# Patient Record
Sex: Female | Born: 1935 | Race: White | Hispanic: No | Marital: Married | Smoking: Never smoker
Health system: Southern US, Community
[De-identification: ages and names within clinical notes are randomized; demographics above are authoritative.]

## PROBLEM LIST (undated history)

## (undated) DIAGNOSIS — I739 Peripheral vascular disease, unspecified: Secondary | ICD-10-CM

## (undated) DIAGNOSIS — J942 Hemothorax: Secondary | ICD-10-CM

## (undated) DIAGNOSIS — N390 Urinary tract infection, site not specified: Secondary | ICD-10-CM

## (undated) DIAGNOSIS — G4700 Insomnia, unspecified: Secondary | ICD-10-CM

## (undated) DIAGNOSIS — I639 Cerebral infarction, unspecified: Secondary | ICD-10-CM

## (undated) DIAGNOSIS — IMO0002 Reserved for concepts with insufficient information to code with codable children: Secondary | ICD-10-CM

## (undated) DIAGNOSIS — F32A Depression, unspecified: Secondary | ICD-10-CM

## (undated) DIAGNOSIS — I779 Disorder of arteries and arterioles, unspecified: Secondary | ICD-10-CM

## (undated) DIAGNOSIS — E538 Deficiency of other specified B group vitamins: Secondary | ICD-10-CM

## (undated) DIAGNOSIS — K219 Gastro-esophageal reflux disease without esophagitis: Secondary | ICD-10-CM

## (undated) DIAGNOSIS — E785 Hyperlipidemia, unspecified: Secondary | ICD-10-CM

## (undated) DIAGNOSIS — R609 Edema, unspecified: Secondary | ICD-10-CM

## (undated) DIAGNOSIS — R55 Syncope and collapse: Secondary | ICD-10-CM

## (undated) DIAGNOSIS — F459 Somatoform disorder, unspecified: Secondary | ICD-10-CM

## (undated) DIAGNOSIS — F329 Major depressive disorder, single episode, unspecified: Secondary | ICD-10-CM

## (undated) DIAGNOSIS — N393 Stress incontinence (female) (male): Secondary | ICD-10-CM

## (undated) DIAGNOSIS — C50919 Malignant neoplasm of unspecified site of unspecified female breast: Secondary | ICD-10-CM

## (undated) DIAGNOSIS — R109 Unspecified abdominal pain: Secondary | ICD-10-CM

## (undated) DIAGNOSIS — N289 Disorder of kidney and ureter, unspecified: Secondary | ICD-10-CM

## (undated) DIAGNOSIS — Z860100 Personal history of colon polyps, unspecified: Secondary | ICD-10-CM

## (undated) DIAGNOSIS — E876 Hypokalemia: Secondary | ICD-10-CM

## (undated) DIAGNOSIS — A048 Other specified bacterial intestinal infections: Secondary | ICD-10-CM

## (undated) DIAGNOSIS — K5730 Diverticulosis of large intestine without perforation or abscess without bleeding: Secondary | ICD-10-CM

## (undated) DIAGNOSIS — I2510 Atherosclerotic heart disease of native coronary artery without angina pectoris: Secondary | ICD-10-CM

## (undated) DIAGNOSIS — F419 Anxiety disorder, unspecified: Secondary | ICD-10-CM

## (undated) DIAGNOSIS — R9430 Abnormal result of cardiovascular function study, unspecified: Secondary | ICD-10-CM

## (undated) HISTORY — DX: Disorder of arteries and arterioles, unspecified: I77.9

## (undated) HISTORY — DX: Stress incontinence (female) (male): N39.3

## (undated) HISTORY — PX: OTHER SURGICAL HISTORY: SHX169

## (undated) HISTORY — DX: Deficiency of other specified B group vitamins: E53.8

## (undated) HISTORY — DX: Anxiety disorder, unspecified: F41.9

## (undated) HISTORY — DX: Major depressive disorder, single episode, unspecified: F32.9

## (undated) HISTORY — PX: BREAST FIBROADENOMA SURGERY: SHX580

## (undated) HISTORY — DX: Syncope and collapse: R55

## (undated) HISTORY — DX: Diverticulosis of large intestine without perforation or abscess without bleeding: K57.30

## (undated) HISTORY — DX: Hyperlipidemia, unspecified: E78.5

## (undated) HISTORY — DX: Hemothorax: J94.2

## (undated) HISTORY — DX: Atherosclerotic heart disease of native coronary artery without angina pectoris: I25.10

## (undated) HISTORY — DX: Hypokalemia: E87.6

## (undated) HISTORY — DX: Peripheral vascular disease, unspecified: I73.9

## (undated) HISTORY — DX: Personal history of colon polyps, unspecified: Z86.0100

## (undated) HISTORY — PX: CORONARY ARTERY BYPASS GRAFT: SHX141

## (undated) HISTORY — DX: Somatoform disorder, unspecified: F45.9

## (undated) HISTORY — DX: Malignant neoplasm of unspecified site of unspecified female breast: C50.919

## (undated) HISTORY — DX: Disorder of kidney and ureter, unspecified: N28.9

## (undated) HISTORY — DX: Personal history of colonic polyps: Z86.010

## (undated) HISTORY — DX: Depression, unspecified: F32.A

## (undated) HISTORY — DX: Unspecified abdominal pain: R10.9

## (undated) HISTORY — DX: Edema, unspecified: R60.9

## (undated) HISTORY — DX: Urinary tract infection, site not specified: N39.0

## (undated) HISTORY — DX: Reserved for concepts with insufficient information to code with codable children: IMO0002

## (undated) HISTORY — DX: Insomnia, unspecified: G47.00

## (undated) HISTORY — DX: Other specified bacterial intestinal infections: A04.8

## (undated) HISTORY — PX: MYOMECTOMY: SHX85

## (undated) HISTORY — DX: Gastro-esophageal reflux disease without esophagitis: K21.9

## (undated) HISTORY — DX: Abnormal result of cardiovascular function study, unspecified: R94.30

---

## 1997-10-22 ENCOUNTER — Other Ambulatory Visit: Admission: RE | Admit: 1997-10-22 | Discharge: 1997-10-22 | Payer: Self-pay | Admitting: Family Medicine

## 1997-11-01 ENCOUNTER — Other Ambulatory Visit: Admission: RE | Admit: 1997-11-01 | Discharge: 1997-11-01 | Payer: Self-pay | Admitting: Family Medicine

## 1997-11-17 ENCOUNTER — Other Ambulatory Visit: Admission: RE | Admit: 1997-11-17 | Discharge: 1997-11-17 | Payer: Self-pay | Admitting: Family Medicine

## 1998-04-19 ENCOUNTER — Encounter: Payer: Self-pay | Admitting: Family Medicine

## 1998-04-19 ENCOUNTER — Ambulatory Visit (HOSPITAL_COMMUNITY): Admission: RE | Admit: 1998-04-19 | Discharge: 1998-04-19 | Payer: Self-pay | Admitting: Family Medicine

## 1999-04-04 ENCOUNTER — Ambulatory Visit (HOSPITAL_COMMUNITY): Admission: RE | Admit: 1999-04-04 | Discharge: 1999-04-04 | Payer: Self-pay | Admitting: Family Medicine

## 1999-04-04 ENCOUNTER — Encounter: Payer: Self-pay | Admitting: Family Medicine

## 1999-09-30 ENCOUNTER — Encounter (INDEPENDENT_AMBULATORY_CARE_PROVIDER_SITE_OTHER): Payer: Self-pay | Admitting: Specialist

## 1999-09-30 ENCOUNTER — Other Ambulatory Visit: Admission: RE | Admit: 1999-09-30 | Discharge: 1999-09-30 | Payer: Self-pay | Admitting: Gastroenterology

## 2000-06-26 ENCOUNTER — Other Ambulatory Visit: Admission: RE | Admit: 2000-06-26 | Discharge: 2000-06-26 | Payer: Self-pay | Admitting: Family Medicine

## 2000-10-22 ENCOUNTER — Encounter: Payer: Self-pay | Admitting: Emergency Medicine

## 2000-10-22 ENCOUNTER — Emergency Department (HOSPITAL_COMMUNITY): Admission: EM | Admit: 2000-10-22 | Discharge: 2000-10-22 | Payer: Self-pay | Admitting: Emergency Medicine

## 2001-05-15 ENCOUNTER — Ambulatory Visit (HOSPITAL_COMMUNITY): Admission: RE | Admit: 2001-05-15 | Discharge: 2001-05-15 | Payer: Self-pay | Admitting: Family Medicine

## 2001-05-15 ENCOUNTER — Encounter: Payer: Self-pay | Admitting: Family Medicine

## 2001-07-29 ENCOUNTER — Encounter: Admission: RE | Admit: 2001-07-29 | Discharge: 2001-10-27 | Payer: Self-pay | Admitting: Neurology

## 2002-07-06 ENCOUNTER — Emergency Department (HOSPITAL_COMMUNITY): Admission: EM | Admit: 2002-07-06 | Discharge: 2002-07-07 | Payer: Self-pay | Admitting: Emergency Medicine

## 2002-12-30 ENCOUNTER — Inpatient Hospital Stay (HOSPITAL_COMMUNITY): Admission: EM | Admit: 2002-12-30 | Discharge: 2003-01-08 | Payer: Self-pay | Admitting: Emergency Medicine

## 2002-12-31 ENCOUNTER — Encounter: Payer: Self-pay | Admitting: Cardiology

## 2003-01-01 ENCOUNTER — Encounter: Payer: Self-pay | Admitting: Thoracic Surgery (Cardiothoracic Vascular Surgery)

## 2003-01-02 ENCOUNTER — Encounter: Payer: Self-pay | Admitting: Thoracic Surgery (Cardiothoracic Vascular Surgery)

## 2003-01-03 ENCOUNTER — Encounter: Payer: Self-pay | Admitting: Cardiothoracic Surgery

## 2003-01-04 ENCOUNTER — Encounter: Payer: Self-pay | Admitting: Cardiothoracic Surgery

## 2003-01-05 ENCOUNTER — Encounter: Payer: Self-pay | Admitting: Thoracic Surgery (Cardiothoracic Vascular Surgery)

## 2003-02-10 ENCOUNTER — Encounter (HOSPITAL_COMMUNITY): Admission: RE | Admit: 2003-02-10 | Discharge: 2003-05-11 | Payer: Self-pay | Admitting: Cardiology

## 2003-02-15 ENCOUNTER — Encounter
Admission: RE | Admit: 2003-02-15 | Discharge: 2003-02-15 | Payer: Self-pay | Admitting: Thoracic Surgery (Cardiothoracic Vascular Surgery)

## 2003-02-15 ENCOUNTER — Encounter: Payer: Self-pay | Admitting: Thoracic Surgery (Cardiothoracic Vascular Surgery)

## 2003-04-23 ENCOUNTER — Other Ambulatory Visit: Admission: RE | Admit: 2003-04-23 | Discharge: 2003-04-23 | Payer: Self-pay | Admitting: Obstetrics and Gynecology

## 2003-05-08 HISTORY — PX: HEMICOLECTOMY: SHX854

## 2003-08-26 ENCOUNTER — Encounter: Payer: Self-pay | Admitting: Internal Medicine

## 2003-08-26 DIAGNOSIS — K449 Diaphragmatic hernia without obstruction or gangrene: Secondary | ICD-10-CM | POA: Insufficient documentation

## 2003-08-26 DIAGNOSIS — D126 Benign neoplasm of colon, unspecified: Secondary | ICD-10-CM

## 2003-09-01 ENCOUNTER — Emergency Department (HOSPITAL_COMMUNITY): Admission: EM | Admit: 2003-09-01 | Discharge: 2003-09-01 | Payer: Self-pay | Admitting: Emergency Medicine

## 2003-09-02 ENCOUNTER — Ambulatory Visit (HOSPITAL_COMMUNITY): Admission: RE | Admit: 2003-09-02 | Discharge: 2003-09-02 | Payer: Self-pay | Admitting: Internal Medicine

## 2003-09-09 ENCOUNTER — Ambulatory Visit (HOSPITAL_COMMUNITY): Admission: RE | Admit: 2003-09-09 | Discharge: 2003-09-09 | Payer: Self-pay | Admitting: Internal Medicine

## 2003-09-23 ENCOUNTER — Inpatient Hospital Stay (HOSPITAL_COMMUNITY): Admission: AD | Admit: 2003-09-23 | Discharge: 2003-09-28 | Payer: Self-pay | Admitting: Internal Medicine

## 2003-09-23 ENCOUNTER — Encounter (INDEPENDENT_AMBULATORY_CARE_PROVIDER_SITE_OTHER): Payer: Self-pay | Admitting: *Deleted

## 2003-09-27 ENCOUNTER — Encounter (INDEPENDENT_AMBULATORY_CARE_PROVIDER_SITE_OTHER): Payer: Self-pay | Admitting: *Deleted

## 2003-10-05 ENCOUNTER — Encounter: Payer: Self-pay | Admitting: Internal Medicine

## 2003-10-05 ENCOUNTER — Encounter: Admission: RE | Admit: 2003-10-05 | Discharge: 2003-10-05 | Payer: Self-pay | Admitting: Internal Medicine

## 2003-10-07 ENCOUNTER — Inpatient Hospital Stay (HOSPITAL_COMMUNITY): Admission: AD | Admit: 2003-10-07 | Discharge: 2003-10-08 | Payer: Self-pay | Admitting: Internal Medicine

## 2003-10-21 ENCOUNTER — Inpatient Hospital Stay (HOSPITAL_COMMUNITY): Admission: RE | Admit: 2003-10-21 | Discharge: 2003-10-27 | Payer: Self-pay | Admitting: General Surgery

## 2003-10-21 ENCOUNTER — Encounter (INDEPENDENT_AMBULATORY_CARE_PROVIDER_SITE_OTHER): Payer: Self-pay | Admitting: *Deleted

## 2004-03-17 ENCOUNTER — Ambulatory Visit: Payer: Self-pay | Admitting: Internal Medicine

## 2004-03-21 ENCOUNTER — Ambulatory Visit: Payer: Self-pay | Admitting: Internal Medicine

## 2004-05-31 ENCOUNTER — Ambulatory Visit: Payer: Self-pay | Admitting: Internal Medicine

## 2004-07-04 ENCOUNTER — Ambulatory Visit: Payer: Self-pay | Admitting: Internal Medicine

## 2004-07-11 ENCOUNTER — Ambulatory Visit: Payer: Self-pay | Admitting: Internal Medicine

## 2004-07-26 ENCOUNTER — Ambulatory Visit: Payer: Self-pay | Admitting: Internal Medicine

## 2004-08-28 ENCOUNTER — Ambulatory Visit: Payer: Self-pay | Admitting: Cardiology

## 2004-09-20 ENCOUNTER — Ambulatory Visit: Payer: Self-pay | Admitting: Internal Medicine

## 2004-09-27 ENCOUNTER — Ambulatory Visit: Payer: Self-pay | Admitting: Internal Medicine

## 2004-10-24 ENCOUNTER — Ambulatory Visit: Payer: Self-pay | Admitting: Internal Medicine

## 2004-10-30 ENCOUNTER — Ambulatory Visit: Payer: Self-pay | Admitting: Internal Medicine

## 2004-11-01 ENCOUNTER — Ambulatory Visit: Payer: Self-pay | Admitting: Cardiology

## 2004-11-08 ENCOUNTER — Ambulatory Visit: Payer: Self-pay | Admitting: Cardiology

## 2004-11-20 ENCOUNTER — Ambulatory Visit: Payer: Self-pay | Admitting: Internal Medicine

## 2004-11-22 ENCOUNTER — Encounter (INDEPENDENT_AMBULATORY_CARE_PROVIDER_SITE_OTHER): Payer: Self-pay | Admitting: *Deleted

## 2004-11-22 ENCOUNTER — Ambulatory Visit: Payer: Self-pay | Admitting: Cardiology

## 2004-11-28 ENCOUNTER — Ambulatory Visit: Payer: Self-pay | Admitting: Internal Medicine

## 2004-11-30 ENCOUNTER — Ambulatory Visit: Payer: Self-pay | Admitting: Internal Medicine

## 2005-01-01 ENCOUNTER — Ambulatory Visit: Payer: Self-pay | Admitting: Internal Medicine

## 2005-02-15 ENCOUNTER — Emergency Department (HOSPITAL_COMMUNITY): Admission: EM | Admit: 2005-02-15 | Discharge: 2005-02-16 | Payer: Self-pay | Admitting: Emergency Medicine

## 2005-02-22 ENCOUNTER — Ambulatory Visit: Payer: Self-pay | Admitting: Cardiology

## 2005-03-05 ENCOUNTER — Ambulatory Visit: Payer: Self-pay | Admitting: Internal Medicine

## 2005-04-03 ENCOUNTER — Ambulatory Visit: Payer: Self-pay | Admitting: Internal Medicine

## 2005-05-04 ENCOUNTER — Ambulatory Visit: Payer: Self-pay | Admitting: Internal Medicine

## 2005-05-14 ENCOUNTER — Ambulatory Visit: Payer: Self-pay | Admitting: Cardiology

## 2005-05-16 ENCOUNTER — Ambulatory Visit (HOSPITAL_COMMUNITY): Admission: RE | Admit: 2005-05-16 | Discharge: 2005-05-16 | Payer: Self-pay | Admitting: Cardiology

## 2005-05-16 ENCOUNTER — Ambulatory Visit: Payer: Self-pay | Admitting: Cardiology

## 2005-06-01 ENCOUNTER — Ambulatory Visit: Payer: Self-pay | Admitting: Internal Medicine

## 2005-06-13 ENCOUNTER — Ambulatory Visit: Payer: Self-pay | Admitting: Cardiology

## 2005-07-05 ENCOUNTER — Ambulatory Visit: Payer: Self-pay | Admitting: Internal Medicine

## 2005-07-20 ENCOUNTER — Encounter (INDEPENDENT_AMBULATORY_CARE_PROVIDER_SITE_OTHER): Payer: Self-pay | Admitting: Specialist

## 2005-07-20 ENCOUNTER — Ambulatory Visit: Payer: Self-pay | Admitting: Internal Medicine

## 2005-07-30 ENCOUNTER — Ambulatory Visit: Payer: Self-pay | Admitting: Internal Medicine

## 2005-07-31 ENCOUNTER — Ambulatory Visit: Payer: Self-pay | Admitting: Internal Medicine

## 2005-09-03 ENCOUNTER — Ambulatory Visit: Payer: Self-pay | Admitting: Internal Medicine

## 2005-09-20 ENCOUNTER — Ambulatory Visit: Payer: Self-pay | Admitting: Cardiology

## 2005-09-24 ENCOUNTER — Ambulatory Visit: Payer: Self-pay

## 2005-10-04 ENCOUNTER — Ambulatory Visit: Payer: Self-pay | Admitting: Cardiology

## 2005-10-12 ENCOUNTER — Ambulatory Visit: Payer: Self-pay | Admitting: Internal Medicine

## 2005-10-20 ENCOUNTER — Encounter: Payer: Self-pay | Admitting: Internal Medicine

## 2005-11-23 ENCOUNTER — Ambulatory Visit: Payer: Self-pay | Admitting: Cardiology

## 2005-12-09 ENCOUNTER — Ambulatory Visit: Payer: Self-pay | Admitting: Internal Medicine

## 2005-12-09 ENCOUNTER — Encounter (INDEPENDENT_AMBULATORY_CARE_PROVIDER_SITE_OTHER): Payer: Self-pay | Admitting: *Deleted

## 2005-12-09 ENCOUNTER — Observation Stay (HOSPITAL_COMMUNITY): Admission: EM | Admit: 2005-12-09 | Discharge: 2005-12-09 | Payer: Self-pay | Admitting: Emergency Medicine

## 2005-12-10 ENCOUNTER — Ambulatory Visit: Payer: Self-pay | Admitting: Internal Medicine

## 2005-12-27 ENCOUNTER — Ambulatory Visit: Payer: Self-pay | Admitting: Internal Medicine

## 2006-02-19 ENCOUNTER — Other Ambulatory Visit: Admission: RE | Admit: 2006-02-19 | Discharge: 2006-02-19 | Payer: Self-pay | Admitting: Obstetrics and Gynecology

## 2006-02-25 ENCOUNTER — Ambulatory Visit (HOSPITAL_COMMUNITY): Admission: RE | Admit: 2006-02-25 | Discharge: 2006-02-25 | Payer: Self-pay | Admitting: Obstetrics and Gynecology

## 2006-02-26 ENCOUNTER — Ambulatory Visit: Payer: Self-pay | Admitting: Internal Medicine

## 2006-03-20 ENCOUNTER — Ambulatory Visit: Payer: Self-pay | Admitting: Internal Medicine

## 2006-04-08 ENCOUNTER — Encounter (INDEPENDENT_AMBULATORY_CARE_PROVIDER_SITE_OTHER): Payer: Self-pay | Admitting: *Deleted

## 2006-04-08 ENCOUNTER — Ambulatory Visit (HOSPITAL_COMMUNITY): Admission: RE | Admit: 2006-04-08 | Discharge: 2006-04-08 | Payer: Self-pay | Admitting: Obstetrics and Gynecology

## 2006-04-26 ENCOUNTER — Ambulatory Visit: Payer: Self-pay | Admitting: Internal Medicine

## 2006-05-21 ENCOUNTER — Ambulatory Visit: Payer: Self-pay | Admitting: Cardiology

## 2006-06-23 ENCOUNTER — Emergency Department (HOSPITAL_COMMUNITY): Admission: EM | Admit: 2006-06-23 | Discharge: 2006-06-23 | Payer: Self-pay | Admitting: Emergency Medicine

## 2006-06-27 ENCOUNTER — Ambulatory Visit: Payer: Self-pay | Admitting: Internal Medicine

## 2006-07-05 ENCOUNTER — Ambulatory Visit: Payer: Self-pay | Admitting: Cardiology

## 2006-07-05 LAB — CONVERTED CEMR LAB
CO2: 30 meq/L (ref 19–32)
Calcium: 9.1 mg/dL (ref 8.4–10.5)
Chloride: 96 meq/L (ref 96–112)
Creatinine, Ser: 1.4 mg/dL — ABNORMAL HIGH (ref 0.4–1.2)
Glucose, Bld: 121 mg/dL — ABNORMAL HIGH (ref 70–99)

## 2006-08-15 ENCOUNTER — Ambulatory Visit: Payer: Self-pay | Admitting: Cardiology

## 2006-08-29 ENCOUNTER — Ambulatory Visit: Payer: Self-pay | Admitting: Internal Medicine

## 2006-09-10 ENCOUNTER — Ambulatory Visit: Payer: Self-pay | Admitting: Internal Medicine

## 2006-10-01 ENCOUNTER — Ambulatory Visit: Payer: Self-pay | Admitting: Internal Medicine

## 2006-10-15 ENCOUNTER — Ambulatory Visit: Payer: Self-pay | Admitting: Internal Medicine

## 2006-10-21 ENCOUNTER — Ambulatory Visit: Payer: Self-pay | Admitting: Cardiology

## 2006-11-20 ENCOUNTER — Ambulatory Visit: Payer: Self-pay | Admitting: Internal Medicine

## 2006-11-27 ENCOUNTER — Encounter: Payer: Self-pay | Admitting: Internal Medicine

## 2006-11-27 DIAGNOSIS — L299 Pruritus, unspecified: Secondary | ICD-10-CM | POA: Insufficient documentation

## 2006-11-27 DIAGNOSIS — R109 Unspecified abdominal pain: Secondary | ICD-10-CM | POA: Insufficient documentation

## 2006-11-27 DIAGNOSIS — Z860100 Personal history of colon polyps, unspecified: Secondary | ICD-10-CM | POA: Insufficient documentation

## 2006-11-27 DIAGNOSIS — E559 Vitamin D deficiency, unspecified: Secondary | ICD-10-CM | POA: Insufficient documentation

## 2006-11-27 DIAGNOSIS — E538 Deficiency of other specified B group vitamins: Secondary | ICD-10-CM

## 2006-11-27 DIAGNOSIS — K5730 Diverticulosis of large intestine without perforation or abscess without bleeding: Secondary | ICD-10-CM | POA: Insufficient documentation

## 2006-11-27 DIAGNOSIS — E785 Hyperlipidemia, unspecified: Secondary | ICD-10-CM

## 2006-11-27 DIAGNOSIS — L509 Urticaria, unspecified: Secondary | ICD-10-CM | POA: Insufficient documentation

## 2006-11-27 DIAGNOSIS — N39498 Other specified urinary incontinence: Secondary | ICD-10-CM | POA: Insufficient documentation

## 2006-11-27 DIAGNOSIS — J309 Allergic rhinitis, unspecified: Secondary | ICD-10-CM | POA: Insufficient documentation

## 2006-11-27 DIAGNOSIS — K219 Gastro-esophageal reflux disease without esophagitis: Secondary | ICD-10-CM

## 2006-12-31 ENCOUNTER — Ambulatory Visit: Payer: Self-pay | Admitting: Internal Medicine

## 2007-01-28 DIAGNOSIS — F1920 Other psychoactive substance dependence, uncomplicated: Secondary | ICD-10-CM | POA: Insufficient documentation

## 2007-02-27 ENCOUNTER — Ambulatory Visit: Payer: Self-pay | Admitting: Internal Medicine

## 2007-02-27 DIAGNOSIS — R413 Other amnesia: Secondary | ICD-10-CM | POA: Insufficient documentation

## 2007-02-27 LAB — CONVERTED CEMR LAB
Albumin: 3.5 g/dL (ref 3.5–5.2)
Basophils Absolute: 0 10*3/uL (ref 0.0–0.1)
Bilirubin, Direct: 0.1 mg/dL (ref 0.0–0.3)
Creatinine, Ser: 1.5 mg/dL — ABNORMAL HIGH (ref 0.4–1.2)
Eosinophils Absolute: 0.3 10*3/uL (ref 0.0–0.6)
HCT: 37.3 % (ref 36.0–46.0)
Hemoglobin: 12.8 g/dL (ref 12.0–15.0)
MCHC: 34.3 g/dL (ref 30.0–36.0)
MCV: 89.9 fL (ref 78.0–100.0)
Monocytes Absolute: 0.6 10*3/uL (ref 0.2–0.7)
Neutrophils Relative %: 63.5 % (ref 43.0–77.0)
Potassium: 3.7 meq/L (ref 3.5–5.1)
RDW: 13.3 % (ref 11.5–14.6)
Sodium: 139 meq/L (ref 135–145)
TSH: 2.92 microintl units/mL (ref 0.35–5.50)
Total Bilirubin: 0.9 mg/dL (ref 0.3–1.2)

## 2007-03-02 ENCOUNTER — Encounter: Payer: Self-pay | Admitting: Internal Medicine

## 2007-03-02 DIAGNOSIS — F411 Generalized anxiety disorder: Secondary | ICD-10-CM | POA: Insufficient documentation

## 2007-03-02 DIAGNOSIS — F329 Major depressive disorder, single episode, unspecified: Secondary | ICD-10-CM

## 2007-03-28 ENCOUNTER — Encounter: Payer: Self-pay | Admitting: Internal Medicine

## 2007-03-31 ENCOUNTER — Ambulatory Visit: Payer: Self-pay | Admitting: Cardiology

## 2007-03-31 DIAGNOSIS — I495 Sick sinus syndrome: Secondary | ICD-10-CM | POA: Insufficient documentation

## 2007-04-24 ENCOUNTER — Encounter: Payer: Self-pay | Admitting: Internal Medicine

## 2007-04-28 ENCOUNTER — Ambulatory Visit: Payer: Self-pay | Admitting: Internal Medicine

## 2007-06-02 ENCOUNTER — Telehealth: Payer: Self-pay | Admitting: Internal Medicine

## 2007-06-23 ENCOUNTER — Encounter: Payer: Self-pay | Admitting: Internal Medicine

## 2007-06-30 ENCOUNTER — Ambulatory Visit: Payer: Self-pay | Admitting: Internal Medicine

## 2007-08-08 DIAGNOSIS — G894 Chronic pain syndrome: Secondary | ICD-10-CM

## 2007-08-08 DIAGNOSIS — M76899 Other specified enthesopathies of unspecified lower limb, excluding foot: Secondary | ICD-10-CM

## 2007-08-20 ENCOUNTER — Ambulatory Visit: Payer: Self-pay | Admitting: Internal Medicine

## 2007-08-21 ENCOUNTER — Encounter: Payer: Self-pay | Admitting: Internal Medicine

## 2007-08-21 LAB — CONVERTED CEMR LAB
ALT: 15 units/L (ref 0–35)
AST: 20 units/L (ref 0–37)
Albumin: 3.3 g/dL — ABNORMAL LOW (ref 3.5–5.2)
BUN: 19 mg/dL (ref 6–23)
CO2: 30 meq/L (ref 19–32)
Chloride: 105 meq/L (ref 96–112)
Cholesterol: 390 mg/dL (ref 0–200)
Creatinine, Ser: 1.3 mg/dL — ABNORMAL HIGH (ref 0.4–1.2)
Direct LDL: 252 mg/dL
GFR calc non Af Amer: 42 mL/min
Glucose, Bld: 90 mg/dL (ref 70–99)
Total Protein: 6.2 g/dL (ref 6.0–8.3)
VLDL: 61 mg/dL — ABNORMAL HIGH (ref 0–40)

## 2007-08-27 ENCOUNTER — Ambulatory Visit: Payer: Self-pay | Admitting: Internal Medicine

## 2007-08-27 DIAGNOSIS — E876 Hypokalemia: Secondary | ICD-10-CM | POA: Insufficient documentation

## 2007-09-24 ENCOUNTER — Ambulatory Visit: Payer: Self-pay | Admitting: Cardiology

## 2007-10-03 ENCOUNTER — Ambulatory Visit: Payer: Self-pay | Admitting: Internal Medicine

## 2007-10-03 DIAGNOSIS — R1011 Right upper quadrant pain: Secondary | ICD-10-CM

## 2007-10-06 ENCOUNTER — Encounter: Payer: Self-pay | Admitting: Internal Medicine

## 2007-10-06 ENCOUNTER — Ambulatory Visit (HOSPITAL_COMMUNITY): Admission: RE | Admit: 2007-10-06 | Discharge: 2007-10-06 | Payer: Self-pay | Admitting: Internal Medicine

## 2007-10-23 ENCOUNTER — Encounter: Payer: Self-pay | Admitting: Internal Medicine

## 2007-11-03 ENCOUNTER — Ambulatory Visit: Payer: Self-pay | Admitting: Internal Medicine

## 2007-11-04 LAB — CONVERTED CEMR LAB
BUN: 21 mg/dL (ref 6–23)
Basophils Absolute: 0 10*3/uL (ref 0.0–0.1)
Basophils Relative: 0.6 % (ref 0.0–1.0)
Chloride: 103 meq/L (ref 96–112)
Creatinine, Ser: 1.4 mg/dL — ABNORMAL HIGH (ref 0.4–1.2)
Crystals: NEGATIVE
Eosinophils Absolute: 0.9 10*3/uL — ABNORMAL HIGH (ref 0.0–0.7)
GFR calc Af Amer: 47 mL/min
GFR calc non Af Amer: 39 mL/min
HCT: 33.9 % — ABNORMAL LOW (ref 36.0–46.0)
Hemoglobin, Urine: NEGATIVE
Ketones, ur: NEGATIVE mg/dL
MCHC: 33.8 g/dL (ref 30.0–36.0)
MCV: 90.2 fL (ref 78.0–100.0)
Monocytes Absolute: 0.8 10*3/uL (ref 0.1–1.0)
Mucus, UA: NEGATIVE
Neutrophils Relative %: 50.3 % (ref 43.0–77.0)
Platelets: 215 10*3/uL (ref 150–400)
TSH: 4.19 microintl units/mL (ref 0.35–5.50)
Total Protein, Urine: NEGATIVE mg/dL
Urine Glucose: NEGATIVE mg/dL
Urobilinogen, UA: 0.2 (ref 0.0–1.0)

## 2007-11-10 ENCOUNTER — Ambulatory Visit: Payer: Self-pay | Admitting: Internal Medicine

## 2007-11-26 ENCOUNTER — Telehealth: Payer: Self-pay | Admitting: Internal Medicine

## 2007-11-28 ENCOUNTER — Telehealth: Payer: Self-pay | Admitting: Internal Medicine

## 2007-11-29 ENCOUNTER — Emergency Department (HOSPITAL_COMMUNITY): Admission: EM | Admit: 2007-11-29 | Discharge: 2007-11-29 | Payer: Self-pay | Admitting: Emergency Medicine

## 2007-12-08 ENCOUNTER — Telehealth: Payer: Self-pay | Admitting: Internal Medicine

## 2007-12-30 ENCOUNTER — Encounter: Payer: Self-pay | Admitting: Internal Medicine

## 2008-01-23 ENCOUNTER — Ambulatory Visit: Payer: Self-pay | Admitting: Internal Medicine

## 2008-01-23 DIAGNOSIS — N3090 Cystitis, unspecified without hematuria: Secondary | ICD-10-CM | POA: Insufficient documentation

## 2008-02-10 ENCOUNTER — Telehealth: Payer: Self-pay | Admitting: Internal Medicine

## 2008-03-08 ENCOUNTER — Ambulatory Visit: Payer: Self-pay | Admitting: Internal Medicine

## 2008-03-23 ENCOUNTER — Ambulatory Visit: Payer: Self-pay | Admitting: Internal Medicine

## 2008-03-24 ENCOUNTER — Ambulatory Visit: Payer: Self-pay | Admitting: Cardiology

## 2008-03-24 LAB — CONVERTED CEMR LAB
ALT: 19 units/L (ref 0–35)
Basophils Absolute: 0 10*3/uL (ref 0.0–0.1)
Bilirubin Urine: NEGATIVE
Bilirubin, Direct: 0.1 mg/dL (ref 0.0–0.3)
CO2: 31 meq/L (ref 19–32)
Calcium: 8.8 mg/dL (ref 8.4–10.5)
GFR calc non Af Amer: 36 mL/min
Ketones, ur: NEGATIVE mg/dL
Lymphocytes Relative: 30.4 % (ref 12.0–46.0)
MCHC: 34.1 g/dL (ref 30.0–36.0)
Mucus, UA: NEGATIVE
Neutrophils Relative %: 50.3 % (ref 43.0–77.0)
RDW: 13.8 % (ref 11.5–14.6)
Sodium: 129 meq/L — ABNORMAL LOW (ref 135–145)
TSH: 4.38 microintl units/mL (ref 0.35–5.50)
Total Bilirubin: 0.7 mg/dL (ref 0.3–1.2)
Total CHOL/HDL Ratio: 6.5
Urobilinogen, UA: 0.2 (ref 0.0–1.0)
VLDL: 48 mg/dL — ABNORMAL HIGH (ref 0–40)

## 2008-03-30 ENCOUNTER — Ambulatory Visit: Payer: Self-pay | Admitting: Internal Medicine

## 2008-03-30 DIAGNOSIS — N259 Disorder resulting from impaired renal tubular function, unspecified: Secondary | ICD-10-CM | POA: Insufficient documentation

## 2008-04-16 ENCOUNTER — Ambulatory Visit: Payer: Self-pay | Admitting: Internal Medicine

## 2008-04-16 DIAGNOSIS — M79609 Pain in unspecified limb: Secondary | ICD-10-CM

## 2008-05-26 ENCOUNTER — Ambulatory Visit: Payer: Self-pay | Admitting: Internal Medicine

## 2008-05-26 LAB — CONVERTED CEMR LAB
CO2: 31 meq/L (ref 19–32)
Chloride: 102 meq/L (ref 96–112)
Creatinine, Ser: 1.5 mg/dL — ABNORMAL HIGH (ref 0.4–1.2)
Potassium: 3.7 meq/L (ref 3.5–5.1)
Sodium: 138 meq/L (ref 135–145)

## 2008-06-07 ENCOUNTER — Ambulatory Visit: Payer: Self-pay | Admitting: Internal Medicine

## 2008-07-19 ENCOUNTER — Encounter: Payer: Self-pay | Admitting: Internal Medicine

## 2008-08-02 ENCOUNTER — Telehealth: Payer: Self-pay | Admitting: Internal Medicine

## 2008-08-06 ENCOUNTER — Ambulatory Visit: Payer: Self-pay | Admitting: *Deleted

## 2008-08-06 ENCOUNTER — Observation Stay (HOSPITAL_COMMUNITY): Admission: EM | Admit: 2008-08-06 | Discharge: 2008-08-07 | Payer: Self-pay | Admitting: Emergency Medicine

## 2008-08-09 ENCOUNTER — Ambulatory Visit: Payer: Self-pay | Admitting: Internal Medicine

## 2008-08-09 LAB — CONVERTED CEMR LAB
CO2: 32 meq/L (ref 19–32)
Chloride: 97 meq/L (ref 96–112)
Glucose, Bld: 81 mg/dL (ref 70–99)
Potassium: 3.8 meq/L (ref 3.5–5.1)
Sodium: 135 meq/L (ref 135–145)

## 2008-08-11 ENCOUNTER — Ambulatory Visit: Payer: Self-pay | Admitting: Cardiology

## 2008-08-11 ENCOUNTER — Encounter: Payer: Self-pay | Admitting: Cardiology

## 2008-08-13 ENCOUNTER — Ambulatory Visit: Payer: Self-pay | Admitting: Internal Medicine

## 2008-08-13 DIAGNOSIS — R0789 Other chest pain: Secondary | ICD-10-CM

## 2008-09-08 ENCOUNTER — Ambulatory Visit: Payer: Self-pay | Admitting: Internal Medicine

## 2008-10-12 ENCOUNTER — Inpatient Hospital Stay (HOSPITAL_COMMUNITY): Admission: EM | Admit: 2008-10-12 | Discharge: 2008-10-13 | Payer: Self-pay | Admitting: Emergency Medicine

## 2008-10-12 ENCOUNTER — Ambulatory Visit: Payer: Self-pay | Admitting: Internal Medicine

## 2008-10-13 ENCOUNTER — Encounter (INDEPENDENT_AMBULATORY_CARE_PROVIDER_SITE_OTHER): Payer: Self-pay | Admitting: Emergency Medicine

## 2008-10-13 ENCOUNTER — Ambulatory Visit: Payer: Self-pay | Admitting: Vascular Surgery

## 2008-10-19 ENCOUNTER — Ambulatory Visit: Payer: Self-pay | Admitting: Internal Medicine

## 2008-10-19 DIAGNOSIS — I959 Hypotension, unspecified: Secondary | ICD-10-CM | POA: Insufficient documentation

## 2008-11-11 ENCOUNTER — Ambulatory Visit: Payer: Self-pay | Admitting: Internal Medicine

## 2008-11-30 ENCOUNTER — Encounter (INDEPENDENT_AMBULATORY_CARE_PROVIDER_SITE_OTHER): Payer: Self-pay | Admitting: *Deleted

## 2008-11-30 ENCOUNTER — Emergency Department (HOSPITAL_COMMUNITY): Admission: EM | Admit: 2008-11-30 | Discharge: 2008-11-30 | Payer: Self-pay | Admitting: Emergency Medicine

## 2008-12-04 ENCOUNTER — Inpatient Hospital Stay (HOSPITAL_COMMUNITY): Admission: EM | Admit: 2008-12-04 | Discharge: 2008-12-06 | Payer: Self-pay | Admitting: Emergency Medicine

## 2008-12-05 ENCOUNTER — Ambulatory Visit: Payer: Self-pay | Admitting: Internal Medicine

## 2008-12-07 ENCOUNTER — Telehealth: Payer: Self-pay | Admitting: Internal Medicine

## 2008-12-08 ENCOUNTER — Encounter: Payer: Self-pay | Admitting: Internal Medicine

## 2008-12-16 ENCOUNTER — Telehealth: Payer: Self-pay | Admitting: Internal Medicine

## 2008-12-17 ENCOUNTER — Ambulatory Visit: Payer: Self-pay | Admitting: Internal Medicine

## 2008-12-17 DIAGNOSIS — R339 Retention of urine, unspecified: Secondary | ICD-10-CM

## 2008-12-21 ENCOUNTER — Ambulatory Visit: Payer: Self-pay | Admitting: Internal Medicine

## 2009-01-18 ENCOUNTER — Ambulatory Visit: Payer: Self-pay | Admitting: Internal Medicine

## 2009-02-08 ENCOUNTER — Ambulatory Visit: Payer: Self-pay | Admitting: Internal Medicine

## 2009-02-09 ENCOUNTER — Ambulatory Visit: Payer: Self-pay | Admitting: Internal Medicine

## 2009-02-09 ENCOUNTER — Telehealth: Payer: Self-pay | Admitting: Internal Medicine

## 2009-02-09 ENCOUNTER — Observation Stay (HOSPITAL_COMMUNITY): Admission: EM | Admit: 2009-02-09 | Discharge: 2009-02-10 | Payer: Self-pay | Admitting: Emergency Medicine

## 2009-02-10 ENCOUNTER — Encounter (INDEPENDENT_AMBULATORY_CARE_PROVIDER_SITE_OTHER): Payer: Self-pay | Admitting: *Deleted

## 2009-02-10 ENCOUNTER — Encounter: Payer: Self-pay | Admitting: Internal Medicine

## 2009-02-10 LAB — CONVERTED CEMR LAB
AST: 24 units/L (ref 0–37)
Albumin: 3.5 g/dL (ref 3.5–5.2)
Alkaline Phosphatase: 108 units/L (ref 39–117)
Basophils Absolute: 0 10*3/uL (ref 0.0–0.1)
Basophils Relative: 0.7 % (ref 0.0–3.0)
Bilirubin, Direct: 0.1 mg/dL (ref 0.0–0.3)
Calcium: 8.9 mg/dL (ref 8.4–10.5)
Eosinophils Absolute: 0.1 10*3/uL (ref 0.0–0.7)
GFR calc non Af Amer: 38.56 mL/min (ref >60)
Hemoglobin: 12.2 g/dL (ref 12.0–15.0)
Lymphocytes Relative: 19.3 % (ref 12.0–46.0)
MCHC: 33.3 g/dL (ref 30.0–36.0)
Monocytes Relative: 10.3 % (ref 3.0–12.0)
Neutro Abs: 4.4 10*3/uL (ref 1.4–7.7)
Neutrophils Relative %: 67.4 % (ref 43.0–77.0)
Nitrite: NEGATIVE
Potassium: 3.3 meq/L — ABNORMAL LOW (ref 3.5–5.1)
RBC: 3.91 M/uL (ref 3.87–5.11)
RDW: 13.2 % (ref 11.5–14.6)
Sodium: 130 meq/L — ABNORMAL LOW (ref 135–145)
Specific Gravity, Urine: 1.01 (ref 1.000–1.030)
Total Protein, Urine: NEGATIVE mg/dL
Urine Glucose: NEGATIVE mg/dL
Vitamin B-12: 202 pg/mL — ABNORMAL LOW (ref 211–911)
pH: 6 (ref 5.0–8.0)

## 2009-03-04 ENCOUNTER — Encounter: Payer: Self-pay | Admitting: Cardiology

## 2009-03-07 DIAGNOSIS — R55 Syncope and collapse: Secondary | ICD-10-CM

## 2009-03-07 HISTORY — DX: Syncope and collapse: R55

## 2009-03-08 ENCOUNTER — Encounter: Payer: Self-pay | Admitting: Cardiology

## 2009-03-08 ENCOUNTER — Ambulatory Visit: Payer: Self-pay | Admitting: Cardiology

## 2009-03-24 ENCOUNTER — Ambulatory Visit: Payer: Self-pay | Admitting: Internal Medicine

## 2009-03-24 LAB — CONVERTED CEMR LAB
Albumin: 3.6 g/dL (ref 3.5–5.2)
Alkaline Phosphatase: 114 units/L (ref 39–117)
Basophils Absolute: 0 10*3/uL (ref 0.0–0.1)
Basophils Relative: 0.2 % (ref 0.0–3.0)
CO2: 29 meq/L (ref 19–32)
Chloride: 98 meq/L (ref 96–112)
Eosinophils Absolute: 0.4 10*3/uL (ref 0.0–0.7)
Glucose, Bld: 124 mg/dL — ABNORMAL HIGH (ref 70–99)
HCT: 34.7 % — ABNORMAL LOW (ref 36.0–46.0)
Hemoglobin: 12 g/dL (ref 12.0–15.0)
Lipase: 2 units/L — ABNORMAL LOW (ref 11.0–59.0)
Lymphocytes Relative: 21.3 % (ref 12.0–46.0)
Lymphs Abs: 1 10*3/uL (ref 0.7–4.0)
MCHC: 34.6 g/dL (ref 30.0–36.0)
MCV: 94.2 fL (ref 78.0–100.0)
Monocytes Absolute: 0.7 10*3/uL (ref 0.1–1.0)
Neutro Abs: 2.8 10*3/uL (ref 1.4–7.7)
Potassium: 3.5 meq/L (ref 3.5–5.1)
RBC: 3.68 M/uL — ABNORMAL LOW (ref 3.87–5.11)
RDW: 14.4 % (ref 11.5–14.6)
Sodium: 136 meq/L (ref 135–145)
TSH: 1.54 microintl units/mL (ref 0.35–5.50)
Total Protein: 6.4 g/dL (ref 6.0–8.3)
Vitamin B-12: 1302 pg/mL — ABNORMAL HIGH (ref 211–911)

## 2009-03-27 ENCOUNTER — Encounter (INDEPENDENT_AMBULATORY_CARE_PROVIDER_SITE_OTHER): Payer: Self-pay | Admitting: *Deleted

## 2009-03-27 ENCOUNTER — Inpatient Hospital Stay (HOSPITAL_COMMUNITY): Admission: EM | Admit: 2009-03-27 | Discharge: 2009-03-28 | Payer: Self-pay | Admitting: Emergency Medicine

## 2009-03-28 ENCOUNTER — Encounter (INDEPENDENT_AMBULATORY_CARE_PROVIDER_SITE_OTHER): Payer: Self-pay | Admitting: Internal Medicine

## 2009-03-29 ENCOUNTER — Telehealth: Payer: Self-pay | Admitting: Cardiology

## 2009-03-30 ENCOUNTER — Ambulatory Visit: Payer: Self-pay

## 2009-03-30 ENCOUNTER — Ambulatory Visit (HOSPITAL_COMMUNITY): Admission: RE | Admit: 2009-03-30 | Discharge: 2009-03-30 | Payer: Self-pay | Admitting: Cardiology

## 2009-03-30 ENCOUNTER — Encounter: Payer: Self-pay | Admitting: Cardiology

## 2009-03-30 ENCOUNTER — Ambulatory Visit: Payer: Self-pay | Admitting: Cardiology

## 2009-04-05 ENCOUNTER — Ambulatory Visit: Payer: Self-pay | Admitting: Cardiology

## 2009-05-03 ENCOUNTER — Ambulatory Visit: Payer: Self-pay | Admitting: Internal Medicine

## 2009-05-07 DIAGNOSIS — C50919 Malignant neoplasm of unspecified site of unspecified female breast: Secondary | ICD-10-CM

## 2009-05-07 HISTORY — DX: Malignant neoplasm of unspecified site of unspecified female breast: C50.919

## 2009-05-07 HISTORY — PX: BREAST LUMPECTOMY: SHX2

## 2009-05-12 ENCOUNTER — Telehealth: Payer: Self-pay | Admitting: Internal Medicine

## 2009-05-28 ENCOUNTER — Emergency Department (HOSPITAL_COMMUNITY): Admission: EM | Admit: 2009-05-28 | Discharge: 2009-05-28 | Payer: Self-pay | Admitting: Family Medicine

## 2009-06-02 ENCOUNTER — Ambulatory Visit: Payer: Self-pay | Admitting: Cardiology

## 2009-06-07 ENCOUNTER — Telehealth: Payer: Self-pay | Admitting: Internal Medicine

## 2009-06-08 ENCOUNTER — Ambulatory Visit: Payer: Self-pay | Admitting: Internal Medicine

## 2009-06-08 LAB — CONVERTED CEMR LAB
ALT: 20 units/L (ref 0–35)
AST: 24 units/L (ref 0–37)
Albumin: 3.5 g/dL (ref 3.5–5.2)
BUN: 17 mg/dL (ref 6–23)
Basophils Relative: 0.5 % (ref 0.0–3.0)
Creatinine, Ser: 1.4 mg/dL — ABNORMAL HIGH (ref 0.4–1.2)
Eosinophils Relative: 4.7 % (ref 0.0–5.0)
GFR calc non Af Amer: 38.53 mL/min (ref >60)
Glucose, Bld: 93 mg/dL (ref 70–99)
HCT: 37.5 % (ref 36.0–46.0)
Monocytes Relative: 11 % (ref 3.0–12.0)
Neutrophils Relative %: 50.8 % (ref 43.0–77.0)
Platelets: 241 10*3/uL (ref 150.0–400.0)
Potassium: 3.2 meq/L — ABNORMAL LOW (ref 3.5–5.1)
RBC: 3.98 M/uL (ref 3.87–5.11)
WBC: 6.9 10*3/uL (ref 4.5–10.5)

## 2009-06-10 ENCOUNTER — Ambulatory Visit: Payer: Self-pay | Admitting: Internal Medicine

## 2009-07-07 ENCOUNTER — Ambulatory Visit: Payer: Self-pay | Admitting: Internal Medicine

## 2009-07-07 DIAGNOSIS — R1310 Dysphagia, unspecified: Secondary | ICD-10-CM | POA: Insufficient documentation

## 2009-07-27 ENCOUNTER — Encounter: Payer: Self-pay | Admitting: Internal Medicine

## 2009-08-02 ENCOUNTER — Ambulatory Visit: Payer: Self-pay | Admitting: Internal Medicine

## 2009-08-03 LAB — CONVERTED CEMR LAB
CO2: 32 meq/L (ref 19–32)
Chloride: 94 meq/L — ABNORMAL LOW (ref 96–112)
Glucose, Bld: 91 mg/dL (ref 70–99)
Potassium: 3.2 meq/L — ABNORMAL LOW (ref 3.5–5.1)
Sodium: 137 meq/L (ref 135–145)

## 2009-08-12 ENCOUNTER — Ambulatory Visit: Payer: Self-pay | Admitting: Internal Medicine

## 2009-08-16 ENCOUNTER — Ambulatory Visit (HOSPITAL_COMMUNITY): Admission: RE | Admit: 2009-08-16 | Discharge: 2009-08-16 | Payer: Self-pay | Admitting: Internal Medicine

## 2009-08-23 ENCOUNTER — Ambulatory Visit: Payer: Self-pay | Admitting: Internal Medicine

## 2009-08-27 ENCOUNTER — Encounter: Payer: Self-pay | Admitting: Internal Medicine

## 2009-08-29 ENCOUNTER — Telehealth: Payer: Self-pay | Admitting: Internal Medicine

## 2009-08-31 ENCOUNTER — Ambulatory Visit: Payer: Self-pay | Admitting: Internal Medicine

## 2009-08-31 DIAGNOSIS — A048 Other specified bacterial intestinal infections: Secondary | ICD-10-CM | POA: Insufficient documentation

## 2009-09-21 ENCOUNTER — Encounter (INDEPENDENT_AMBULATORY_CARE_PROVIDER_SITE_OTHER): Payer: Self-pay | Admitting: *Deleted

## 2009-10-07 ENCOUNTER — Ambulatory Visit: Payer: Self-pay | Admitting: Internal Medicine

## 2009-10-07 LAB — CONVERTED CEMR LAB
Albumin: 3.4 g/dL — ABNORMAL LOW (ref 3.5–5.2)
Alkaline Phosphatase: 98 units/L (ref 39–117)
BUN: 30 mg/dL — ABNORMAL HIGH (ref 6–23)
Basophils Absolute: 0 10*3/uL (ref 0.0–0.1)
Bilirubin, Direct: 0.1 mg/dL (ref 0.0–0.3)
CO2: 29 meq/L (ref 19–32)
Chloride: 96 meq/L (ref 96–112)
Cholesterol: 302 mg/dL — ABNORMAL HIGH (ref 0–200)
Glucose, Bld: 80 mg/dL (ref 70–99)
HCT: 32.3 % — ABNORMAL LOW (ref 36.0–46.0)
Hemoglobin: 11.2 g/dL — ABNORMAL LOW (ref 12.0–15.0)
Lymphs Abs: 2 10*3/uL (ref 0.7–4.0)
Monocytes Relative: 11.1 % (ref 3.0–12.0)
Neutro Abs: 2.8 10*3/uL (ref 1.4–7.7)
Potassium: 4.2 meq/L (ref 3.5–5.1)
RDW: 13.3 % (ref 11.5–14.6)
Total Bilirubin: 0.6 mg/dL (ref 0.3–1.2)
Total CHOL/HDL Ratio: 6
VLDL: 54.8 mg/dL — ABNORMAL HIGH (ref 0.0–40.0)

## 2009-10-14 ENCOUNTER — Ambulatory Visit: Payer: Self-pay | Admitting: Internal Medicine

## 2009-10-26 ENCOUNTER — Ambulatory Visit: Payer: Self-pay | Admitting: Internal Medicine

## 2009-10-26 DIAGNOSIS — E871 Hypo-osmolality and hyponatremia: Secondary | ICD-10-CM

## 2009-10-26 DIAGNOSIS — F429 Obsessive-compulsive disorder, unspecified: Secondary | ICD-10-CM | POA: Insufficient documentation

## 2009-10-26 DIAGNOSIS — F29 Unspecified psychosis not due to a substance or known physiological condition: Secondary | ICD-10-CM | POA: Insufficient documentation

## 2009-10-26 DIAGNOSIS — R350 Frequency of micturition: Secondary | ICD-10-CM

## 2009-10-27 LAB — CONVERTED CEMR LAB
CO2: 26 meq/L (ref 19–32)
Calcium: 8.9 mg/dL (ref 8.4–10.5)
Glucose, Bld: 113 mg/dL — ABNORMAL HIGH (ref 70–99)
Ketones, ur: NEGATIVE mg/dL
Potassium: 4.1 meq/L (ref 3.5–5.1)
Sodium: 124 meq/L — ABNORMAL LOW (ref 135–145)
Specific Gravity, Urine: 1.02 (ref 1.000–1.030)
Total Protein, Urine: NEGATIVE mg/dL
Urine Glucose: NEGATIVE mg/dL
pH: 5.5 (ref 5.0–8.0)

## 2009-10-31 ENCOUNTER — Ambulatory Visit: Payer: Self-pay | Admitting: Internal Medicine

## 2009-10-31 LAB — CONVERTED CEMR LAB
GFR calc non Af Amer: 33.23 mL/min (ref >60)
Potassium: 4.4 meq/L (ref 3.5–5.1)
Sodium: 134 meq/L — ABNORMAL LOW (ref 135–145)

## 2009-11-08 ENCOUNTER — Ambulatory Visit: Payer: Self-pay | Admitting: Internal Medicine

## 2009-11-08 DIAGNOSIS — G4700 Insomnia, unspecified: Secondary | ICD-10-CM | POA: Insufficient documentation

## 2009-11-09 ENCOUNTER — Encounter: Payer: Self-pay | Admitting: Internal Medicine

## 2009-11-09 ENCOUNTER — Telehealth: Payer: Self-pay | Admitting: Internal Medicine

## 2009-11-16 ENCOUNTER — Ambulatory Visit: Payer: Self-pay | Admitting: Cardiology

## 2009-12-09 ENCOUNTER — Ambulatory Visit: Payer: Self-pay | Admitting: Internal Medicine

## 2009-12-09 LAB — CONVERTED CEMR LAB
Basophils Relative: 0.6 % (ref 0.0–3.0)
CO2: 28 meq/L (ref 19–32)
Calcium: 9 mg/dL (ref 8.4–10.5)
Chloride: 97 meq/L (ref 96–112)
Eosinophils Absolute: 0.3 10*3/uL (ref 0.0–0.7)
Eosinophils Relative: 5.4 % — ABNORMAL HIGH (ref 0.0–5.0)
Hemoglobin: 12.1 g/dL (ref 12.0–15.0)
Lymphocytes Relative: 29.3 % (ref 12.0–46.0)
MCHC: 34.6 g/dL (ref 30.0–36.0)
MCV: 94.2 fL (ref 78.0–100.0)
Neutro Abs: 3.2 10*3/uL (ref 1.4–7.7)
RBC: 3.72 M/uL — ABNORMAL LOW (ref 3.87–5.11)
Sodium: 133 meq/L — ABNORMAL LOW (ref 135–145)
WBC: 6 10*3/uL (ref 4.5–10.5)

## 2009-12-16 ENCOUNTER — Ambulatory Visit: Payer: Self-pay | Admitting: Internal Medicine

## 2010-01-04 ENCOUNTER — Telehealth: Payer: Self-pay | Admitting: Internal Medicine

## 2010-01-06 ENCOUNTER — Emergency Department (HOSPITAL_COMMUNITY): Admission: EM | Admit: 2010-01-06 | Discharge: 2010-01-06 | Payer: Self-pay | Admitting: Emergency Medicine

## 2010-02-08 ENCOUNTER — Telehealth: Payer: Self-pay | Admitting: Internal Medicine

## 2010-02-10 ENCOUNTER — Ambulatory Visit: Payer: Self-pay | Admitting: Internal Medicine

## 2010-02-10 LAB — CONVERTED CEMR LAB
BUN: 19 mg/dL (ref 6–23)
Basophils Absolute: 0 10*3/uL (ref 0.0–0.1)
Basophils Relative: 0.6 % (ref 0.0–3.0)
CO2: 28 meq/L (ref 19–32)
Eosinophils Absolute: 0.3 10*3/uL (ref 0.0–0.7)
GFR calc non Af Amer: 45.95 mL/min (ref >60)
Glucose, Bld: 81 mg/dL (ref 70–99)
HCT: 34.5 % — ABNORMAL LOW (ref 36.0–46.0)
Hemoglobin: 11.9 g/dL — ABNORMAL LOW (ref 12.0–15.0)
Lymphocytes Relative: 30.6 % (ref 12.0–46.0)
Lymphs Abs: 1.7 10*3/uL (ref 0.7–4.0)
MCHC: 34.5 g/dL (ref 30.0–36.0)
MCV: 95.7 fL (ref 78.0–100.0)
Monocytes Absolute: 0.6 10*3/uL (ref 0.1–1.0)
Neutro Abs: 3 10*3/uL (ref 1.4–7.7)
Potassium: 4.2 meq/L (ref 3.5–5.1)
RBC: 3.6 M/uL — ABNORMAL LOW (ref 3.87–5.11)
RDW: 14.3 % (ref 11.5–14.6)

## 2010-02-16 ENCOUNTER — Ambulatory Visit: Payer: Self-pay | Admitting: Internal Medicine

## 2010-03-16 ENCOUNTER — Ambulatory Visit: Payer: Self-pay | Admitting: Cardiology

## 2010-03-27 ENCOUNTER — Ambulatory Visit: Payer: Self-pay | Admitting: Internal Medicine

## 2010-03-27 DIAGNOSIS — N63 Unspecified lump in unspecified breast: Secondary | ICD-10-CM

## 2010-04-04 ENCOUNTER — Encounter: Admission: RE | Admit: 2010-04-04 | Discharge: 2010-04-04 | Payer: Self-pay | Admitting: Internal Medicine

## 2010-04-04 LAB — HM MAMMOGRAPHY

## 2010-04-10 ENCOUNTER — Encounter: Admission: RE | Admit: 2010-04-10 | Discharge: 2010-04-10 | Payer: Self-pay | Admitting: Internal Medicine

## 2010-04-10 ENCOUNTER — Ambulatory Visit: Payer: Self-pay | Admitting: Internal Medicine

## 2010-04-11 ENCOUNTER — Encounter: Payer: Self-pay | Admitting: Internal Medicine

## 2010-04-11 ENCOUNTER — Encounter: Payer: Self-pay | Admitting: Cardiology

## 2010-04-12 ENCOUNTER — Ambulatory Visit: Payer: Self-pay

## 2010-04-12 ENCOUNTER — Encounter: Payer: Self-pay | Admitting: Internal Medicine

## 2010-04-12 ENCOUNTER — Encounter: Payer: Self-pay | Admitting: Cardiology

## 2010-04-17 ENCOUNTER — Ambulatory Visit: Payer: Self-pay | Admitting: Internal Medicine

## 2010-04-17 DIAGNOSIS — Z853 Personal history of malignant neoplasm of breast: Secondary | ICD-10-CM

## 2010-04-20 ENCOUNTER — Encounter: Payer: Self-pay | Admitting: Internal Medicine

## 2010-04-20 ENCOUNTER — Telehealth (INDEPENDENT_AMBULATORY_CARE_PROVIDER_SITE_OTHER): Payer: Self-pay | Admitting: *Deleted

## 2010-04-26 ENCOUNTER — Ambulatory Visit (HOSPITAL_COMMUNITY)
Admission: RE | Admit: 2010-04-26 | Discharge: 2010-04-27 | Payer: Self-pay | Source: Home / Self Care | Attending: General Surgery | Admitting: General Surgery

## 2010-04-26 ENCOUNTER — Encounter (INDEPENDENT_AMBULATORY_CARE_PROVIDER_SITE_OTHER): Payer: Self-pay | Admitting: General Surgery

## 2010-04-26 ENCOUNTER — Encounter
Admission: RE | Admit: 2010-04-26 | Discharge: 2010-04-26 | Payer: Self-pay | Source: Home / Self Care | Attending: General Surgery | Admitting: General Surgery

## 2010-04-28 ENCOUNTER — Ambulatory Visit: Payer: Self-pay | Admitting: Oncology

## 2010-05-10 ENCOUNTER — Encounter: Payer: Self-pay | Admitting: Internal Medicine

## 2010-05-12 ENCOUNTER — Encounter: Payer: Self-pay | Admitting: Internal Medicine

## 2010-05-17 ENCOUNTER — Ambulatory Visit: Payer: Self-pay | Admitting: Genetic Counselor

## 2010-05-23 ENCOUNTER — Ambulatory Visit: Admit: 2010-05-23 | Payer: Self-pay | Admitting: Cardiology

## 2010-05-27 ENCOUNTER — Other Ambulatory Visit: Payer: Self-pay | Admitting: Oncology

## 2010-05-27 DIAGNOSIS — Z853 Personal history of malignant neoplasm of breast: Secondary | ICD-10-CM

## 2010-05-27 DIAGNOSIS — Z780 Asymptomatic menopausal state: Secondary | ICD-10-CM

## 2010-05-28 ENCOUNTER — Encounter: Payer: Self-pay | Admitting: Internal Medicine

## 2010-06-06 NOTE — Assessment & Plan Note (Signed)
Summary: EATEN FOOD GET STUCK  STC   Vital Signs:  Patient profile:   77 year old female Weight:      146 pounds O2 Sat:      96 % Temp:     98.2 degrees F oral Pulse rate:   69 / minute BP sitting:   114 / 60  (left arm)  Vitals Entered By: Tora Perches (July 07, 2009 11:16 AM) CC:  after eating food--difficulty swallowing Is Patient Diabetic? Yes   Primary Care Provider:  Sula Soda, MD  CC:   after eating food--difficulty swallowing.  History of Present Illness: C/o problems w/food getting stuck in throat  x 2 wks. She stopped Protonix - too $$$. F/u abd pain, CAD. No CP.  Preventive Screening-Counseling & Management  Alcohol-Tobacco     Smoking Status: never  Current Medications (verified): 1)  Methadone Hcl 10 Mg  Tabs (Methadone Hcl) .... Take 1 Tablets By Mouth 3-4  Times Per Day As Needed Please,  Fill On or After    08/05/09      . Ok To Fill 1-2 Day Earlier When The Month Has 31 Days in It. 2)  Lorazepam 1 Mg  Tabs (Lorazepam) .... As Needed 1 By Mouth Bid 3)  Nitroquick 0.4 Mg  Subl (Nitroglycerin) .... As Needed 4)  Metoprolol Succinate 25 Mg  Tb24 (Metoprolol Succinate) .... 1/2 Tab Once Daily 5)  Trimethoprim 100 Mg  Tabs (Trimethoprim) .... Once Daily 6)  Aspir-Low 81 Mg Tbec (Aspirin) .Marland Kitchen.. 1 Once Daily Pc 7)  Zolpidem Tartrate 10 Mg  Tabs (Zolpidem Tartrate) .Marland Kitchen.. 1at Bedtime As Needed 8)  Premarin 0.625 Mg/gm Crea (Estrogens, Conjugated) .Marland Kitchen.. 1 G Pv Twice A Week 9)  Triamcinolone Acetonide 0.1 % Oint (Triamcinolone Acetonide) .... Use 1-2 Times A Day 10)  Urinary Tract Health 365-50 Mg Caps (Cranberry-Olive Leaf) .... Take One Tablet By Mouth Once Daily. 11)  Cobal-1000 1000 Mcg/ml Soln (Cyanocobalamin) .Marland Kitchen.. 1ml Subcutaneously Once Per 2 Weeks 12)  Vitamin D (Ergocalciferol) 50000 Unit Caps (Ergocalciferol) .Marland Kitchen.. 1 By Mouth Q 1 Month 13)  Promethazine Hcl 25 Mg Tabs (Promethazine Hcl) .Marland Kitchen.. 1-2 By Mouth Four Times A Day As Needed Nausea 14)  Potassium  Chloride Cr 10 Meq Cr-Caps (Potassium Chloride) .... Take One Tablet By Mouth Daily 15)  Protonix 40 Mg Tbec (Pantoprazole Sodium) .Marland Kitchen.. 1 By Mouth Qam For Indigestion  Allergies: 1)  ! Penicillin 2)  ! Sulfa 3)  ! Erythromycin 4)  ! Novocain 5)  ! * Ivp Dye 6)  ! Doxycycline 7)  ! Cipro 8)  ! * Potassium 9)  ! * Dicoine 10)  ! * Levomithicine 11)  ! Nubain (Nalbuphine Hcl) 12)  ! * Belladonna 13)  ! * Gabapentin 14)  Baclofen (Baclofen) 15)  Carafate (Sucralfate) 16)  Macrobid (Nitrofurantoin Monohyd Macro)  Past History:  Past Medical History: Last updated: 06/02/2009 CAD .Marland Kitchencatheterization January, 2007.. patent grafts  /  hospitalization October, 2010.Marland Kitchen no MI CABG Syncope... March 28, 2009  ??orthostasis ?? Carotid artery disease... Doppler... March 30, 2009... 0-39% LICA, 60-79% RICA .Marland Kitchen increased velocity.. possibly from serpentine vessel EF  50-55%...... no wall motion abnormalities.Marland Kitchenecho March 30, 2009. Allergic rhinitis Colonic polyps, hx of Diverticulosis, colon GERD Hyperlipidemia Abd. pain...Marland KitchenMarland KitchenMarland Kitchen motility disorder, chronic functional, severe    ( Dr Juanda Chance);  Possible OCD with compulsive use of enemas and laxatives Psychosomatic disorder with GI fixation Anxiety Depression Stress incontinence Multiple drug allergies Vit D def Vit B12 def Renal  insufficiency UTIs hypokalemia.... mild  Social History: Last updated: 08/10/2008 Retired MD Married Never Smoked Alcohol use-no The patient is an immigrant from [place].  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  She does not smoke or use alcohol, and is   currently living with her husband.   Review of Systems       The patient complains of abdominal pain.  The patient denies fever, weight loss, weight gain, chest pain, dyspnea on exertion, and prolonged cough.    Physical Exam  General:  appears depressed; communicates in  [name]  Chronically ill-appearing  Nose:  External nasal examination shows no deformity or inflammation. Nasal mucosa are pink and moist without lesions or exudates. Mouth:  No deformity or lesions, dentition normal. Lungs:  Clear throughout to auscultation. Heart:  Regular rate and rhythm; no murmurs, rubs,  or bruits. Abdomen:  soft abdomen with minimal tenderness in left lower quadrant and left middle quadrant. No distention. Liver edge at costal margin. Msk:  Lumbar-sacral spine is tender to palpation over paraspinal muscles and painfull with the ROM  Neurologic:  No cranial nerve deficits noted. Station and gait are normal. Plantar reflexes are down-going bilaterally. DTRs are symmetrical throughout. Sensory, motor and coordinative functions appear intact. Skin:  Intact without suspicious lesions or rashes Psych:  not anxious appearing, not agitated, not suicidal, depressed affect, flat affect, subdued, and withdrawn.     Impression & Recommendations:  Problem # 1:  DYSPHAGIA UNSPECIFIED (ICD-787.20) Assessment New  Start Omeprazole  Orders: Gastroenterology Referral (GI)  Problem # 2:  GERD (ICD-530.81) Assessment: Deteriorated Risks of noncompliance with treatment discussed. Compliance encouraged.  The following medications were removed from the medication list:    Protonix 40 Mg Tbec (Pantoprazole sodium) .Marland Kitchen... 1 by mouth qam for indigestion - she stopped - too $$$ Her updated medication list for this problem includes:    Omeprazole 40 Mg Cpdr (Omeprazole) .Marland Kitchen... 1 by mouth qam for indigestion  Problem # 3:  CHRONIC PAIN SYNDROME (ICD-338.4) Assessment: Unchanged On prescription therapy   Problem # 4:  DEPENDENCE, DRUG NOS, CONTINUOUS (ICD-304.91) Assessment: Unchanged  Problem # 5:  CORONARY ARTERY DISEASE (ICD-414.00) Assessment: Unchanged  Her updated medication list for this problem includes:    Nitroquick 0.4 Mg Subl (Nitroglycerin) .Marland Kitchen... As needed     Metoprolol Succinate 25 Mg Tb24 (Metoprolol succinate) .Marland Kitchen... 1/2 tab once daily    Aspir-low 81 Mg Tbec (Aspirin) .Marland Kitchen... 1 once daily pc  Complete Medication List: 1)  Methadone Hcl 10 Mg Tabs (Methadone hcl) .... Take 1 tablets by mouth 3-4  times per day as needed please,  fill on or after    501/11      . ok to fill 1-2 day earlier when the month has 31 days in it. 2)  Lorazepam 1 Mg Tabs (Lorazepam) .... As needed 1 by mouth bid 3)  Nitroquick 0.4 Mg Subl (Nitroglycerin) .... As needed 4)  Metoprolol Succinate 25 Mg Tb24 (Metoprolol succinate) .... 1/2 tab once daily 5)  Trimethoprim 100 Mg Tabs (Trimethoprim) .... Once daily 6)  Aspir-low 81 Mg Tbec (Aspirin) .Marland Kitchen.. 1 once daily pc 7)  Zolpidem Tartrate 10 Mg Tabs (Zolpidem tartrate) .Marland Kitchen.. 1at bedtime as needed 8)  Premarin 0.625 Mg/gm Crea (Estrogens, conjugated) .Marland Kitchen.. 1 g pv twice a week 9)  Triamcinolone Acetonide 0.1 % Oint (Triamcinolone acetonide) .... Use 1-2 times a day 10)  Urinary Tract  Health 365-50 Mg Caps (Cranberry-olive leaf) .... Take one tablet by mouth once daily. 11)  Cobal-1000 1000 Mcg/ml Soln (Cyanocobalamin) .Marland Kitchen.. 1ml subcutaneously once per 2 weeks 12)  Vitamin D (ergocalciferol) 50000 Unit Caps (Ergocalciferol) .Marland Kitchen.. 1 by mouth q 1 month 13)  Promethazine Hcl 25 Mg Tabs (Promethazine hcl) .Marland Kitchen.. 1-2 by mouth four times a day as needed nausea 14)  Omeprazole 40 Mg Cpdr (Omeprazole) .Marland Kitchen.. 1 by mouth qam for indigestion 15)  Klor-con M10 10 Meq Cr-tabs (Potassium chloride crys cr) .Marland Kitchen.. 1 by mouth bid  Patient Instructions: 1)  Keep return office visit  Prescriptions: METHADONE HCL 10 MG  TABS (METHADONE HCL) Take 1 tablets by mouth 3-4  times per day as needed Please,  fill on or after    501/11      . OK to fill 1-2 day earlier when the month has 31 days in it.  #120 x 0   Entered and Authorized by:   Tresa Garter MD   Signed by:   Tresa Garter MD on 07/07/2009   Method used:   Print then Give to Patient    RxID:   [id number] KLOR-CON M10 10 MEQ CR-TABS (POTASSIUM CHLORIDE CRYS CR) 1 by mouth bid  #60 x 12   Entered and Authorized by:   Tresa Garter MD   Signed by:   Tresa Garter MD on 07/07/2009   Method used:   Print then Give to Patient   RxID:   [id number] OMEPRAZOLE 40 MG CPDR (OMEPRAZOLE) 1 by mouth qam for indigestion  #90 x 3   Entered and Authorized by:   Tresa Garter MD   Signed by:   Tresa Garter MD on 07/07/2009   Method used:   Print then Give to Patient   RxID:   [id number]

## 2010-06-06 NOTE — Assessment & Plan Note (Signed)
Summary: 2 mos f/u #/cd   Vital Signs:  Patient profile:   77 year old female Height:      60 inches Weight:      147 pounds BMI:     28.81 Temp:     97.3 degrees F oral Pulse rate:   72 / minute Pulse rhythm:   regular Resp:     16 per minute BP sitting:   140 / 80  (right arm) Cuff size:   regular  Vitals Entered By: Lanier Prude, CMA(AAMA) (December 16, 2009 10:08 AM) CC: 2 mo f/u Is Patient Diabetic? No   Primary Care Jehad Bisono:  Jacinta Shoe, MD  CC:  2 mo f/u.  History of Present Illness: The patient presents for a follow up of abd pain - not better, anxiety, depression and headaches.  C/o a lot of pain, taking pain pills from [place] and [place]  Current Medications (verified): 1)  Methadone Hcl 10 Mg  Tabs (Methadone Hcl) .... Take 1 Tablets By Mouth 4-  Times Per Day As Needed Please,  Fill On or After    10/19/09      . Ok To Fill 1-2 Day Earlier When The Month Has 31 Days in It. 2)  Lorazepam 1 Mg  Tabs (Lorazepam) .... As Needed 1 By Mouth Two Times A Day -- Qid Prn 3)  Nitroquick 0.4 Mg  Subl (Nitroglycerin) .... As Needed 4)  Metoprolol Succinate 25 Mg  Tb24 (Metoprolol Succinate) .Marland Kitchen.. 1 Tab Once Daily 5)  Trimethoprim 100 Mg  Tabs (Trimethoprim) .... Once Daily 6)  Aspir-Low 81 Mg Tbec (Aspirin) .Marland Kitchen.. 1 Once Daily Pc 7)  Zolpidem Tartrate 10 Mg  Tabs (Zolpidem Tartrate) .Marland Kitchen.. 1at Bedtime For Insomnia Medically Necessary To Have 30 Tabs Every 30 Days 8)  Premarin 0.625 Mg/gm Crea (Estrogens, Conjugated) .Marland Kitchen.. 1 G Pv Twice A Week 9)  Triamcinolone Acetonide 0.1 % Oint (Triamcinolone Acetonide) .... Use 1-2 Times A Day 10)  Urinary Tract Health 365-50 Mg Caps (Cranberry-Olive Leaf) .... Take One Tablet By Mouth Once Daily. 11)  Cobal-1000 1000 Mcg/ml Soln (Cyanocobalamin) .Marland Kitchen.. 1ml Subcutaneously Once Per 2 Weeks 12)  Vitamin D (Ergocalciferol) 50000 Unit Caps (Ergocalciferol) .Marland Kitchen.. 1 By Mouth Q 1 Month 13)  Promethazine Hcl 25 Mg Tabs (Promethazine Hcl) .Marland Kitchen.. 1-2 By  Mouth Four Times A Day As Needed Nausea 14)  Omeprazole 40 Mg Cpdr (Omeprazole) .Marland Kitchen.. 1 By Mouth Qam For Indigestion 15)  Klor-Con M10 10 Meq Cr-Tabs (Potassium Chloride Crys Cr) .Marland Kitchen.. 1 By Mouth Bid 16)  Carafate 1 Gm/74ml Susp (Sucralfate) .... Take 2 Teaspoons By Mouth Two Times A Day 17)  Amitiza 24 Mcg Caps (Lubiprostone) .Marland Kitchen.. 1-2  By Mouth Once Daily As Needed Constipation 18)  Reglan 5 Mg  Tabs (Metoclopramide Hcl) .... Take 1 Tab Ac Three Times A Day. 19)  Omeprazole 20 Mg  Cpdr (Omeprazole) .... Take 1 Twice Daily For 10 Days. 20)  Diphenhydramine Hcl 25 Mg Caps (Diphenhydramine Hcl) .Marland Kitchen.. 1-2 By Mouth Qid As Needed Allergy 21)  Lactulose 20 Gm/5ml Soln (Lactulose) .... Use 30-60 Cc Prn 22)  Divalproex Sodium 125 Mg Tbec (Divalproex Sodium) .Marland Kitchen.. 1 By Mouth Bid 23)  Gelnique 10 % Gel (Oxybutynin Chloride) .... Use On Skin Once Daily For Bladder Issues  Allergies (verified): 1)  ! Penicillin 2)  ! Sulfa 3)  ! Erythromycin 4)  ! Novocain 5)  ! * Ivp Dye 6)  ! Doxycycline 7)  ! Cipro 8)  ! * Potassium  9)  ! * Dicoine 10)  ! * Levomithicine 11)  ! Nubain (Nalbuphine Hcl) 12)  ! * Belladonna 13)  ! * Gabapentin 14)  ! 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  She does not smoke or use alcohol, and is  currently living with her husband.   Review of Systems       The patient complains of anorexia, weight loss, dyspnea on exertion, abdominal  pain, severe indigestion/heartburn, muscle weakness, difficulty walking, and depression.  The patient denies fever, chest pain, melena, and hematochezia.    Physical Exam  General:  appears depressed; communicates in [name]  Chronically ill-appearing - not worse than before  Head:  Normocephalic and atraumatic without obvious abnormalities. No apparent alopecia or balding. Eyes:  watery bags under eyes are less prominent Ears:  WNL Nose:  External nasal examination shows no deformity or inflammation. Nasal mucosa are pink and moist without lesions or exudates. Mouth:  No deformity or lesions, dentition normal. Neck:  Supple; no masses or thyromegaly. Lungs:  Clear throughout to auscultation. Heart:  Regular rate and rhythm; no murmurs, rubs,  or bruits. Abdomen:  soft abdomen with minimal tenderness in left lower quadrant and left middle quadrant. No distention. Liver edge at costal margin. Msk:  Lumbar-sacral spine is tender to palpation over paraspinal muscles and painfull with the ROM  Extremities:  trace left pedal edema and trace right pedal edema.   Neurologic:  Ataxic Skin:  Intact without suspicious lesions or rashes Inguinal Nodes:  No significant adenopathy Psych:  anxious appearing, not agitated, not suicidal, depressed affect, flat affect, subdued and tearful.     Impression & Recommendations:  Problem # 1:  ABDOMINAL PAIN, CHRONIC (ICD-789.00) Assessment Unchanged On the regimen of pain medicine(s) reflected in the chart   Her updated medication list for this problem includes:    Amitiza 24 Mcg Caps (Lubiprostone) .Marland Kitchen... 1-2  by mouth once daily as needed constipation    Reglan 5 Mg Tabs (Metoclopramide hcl) .Marland Kitchen... Take 1 tab ac three times a day.  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 Take 1 tablets by mouth 4-  times per day as needed please,  fill on or after    02/18/10      . ok to fill 1-2 day earlier when the month has 31 days in it. 2)  Lorazepam 1 Mg Tabs (Lorazepam) .... As needed 1 by mouth two times a day -- qid prn 3)  Nitroquick 0.4 Mg Subl (Nitroglycerin) .... As needed 4)  Metoprolol Succinate 25 Mg Tb24 (Metoprolol succinate) .Marland Kitchen.. 1 tab once daily 5)  Trimethoprim 100 Mg Tabs (Trimethoprim) .... Once daily 6)  Aspir-low 81 Mg Tbec (Aspirin) .Marland Kitchen.. 1 once daily pc 7)  Zolpidem Tartrate 10 Mg Tabs (Zolpidem tartrate) .Marland Kitchen.. 1at bedtime for insomnia medically necessary to have 30 tabs every 30 days 8)  Premarin 0.625 Mg/gm Crea (Estrogens, conjugated) .Marland Kitchen.. 1 g pv twice a week 9)  Triamcinolone Acetonide 0.1 % Oint (Triamcinolone acetonide) .... Use 1-2 times a day 10)  Urinary Tract Health 365-50 Mg Caps (Cranberry-olive leaf) .... Take one tablet by mouth once daily. 11)  Cobal-1000 1000 Mcg/ml Soln (Cyanocobalamin) .Marland Kitchen.. 1ml subcutaneously once per 2 weeks 12)  Vitamin D (ergocalciferol) 50000 Unit Caps (Ergocalciferol) .Marland Kitchen.. 1 by mouth q 1 month 13)  Promethazine Hcl 25 Mg Tabs (Promethazine hcl) .Marland Kitchen.. 1-2 by mouth four times a day as needed nausea 14)  Omeprazole 40 Mg Cpdr (Omeprazole) .Marland Kitchen.. 1 by mouth qam for indigestion 15)  Klor-con M10 10 Meq Cr-tabs (Potassium chloride crys cr) .Marland Kitchen.. 1 by mouth bid 16)  Carafate 1 Gm/50ml Susp (Sucralfate) .... Take 2 teaspoons by mouth two times a day 17)  Amitiza 24 Mcg Caps (Lubiprostone) .Marland Kitchen.. 1-2  by mouth once daily as needed constipation 18)  Reglan 5 Mg Tabs (Metoclopramide hcl) .... Take 1 tab ac three times a day. 19)  Omeprazole 20 Mg Cpdr  (Omeprazole) .... Take 1 twice daily for 10 days. 20)  Diphenhydramine Hcl 25 Mg Caps (Diphenhydramine hcl) .Marland Kitchen.. 1-2 by mouth qid as needed allergy 21)  Lactulose 20 Gm/72ml Soln (Lactulose) .... Use 30-60 cc prn 22)  Gelnique 10 % Gel (Oxybutynin chloride) .... Use on skin once daily for bladder issues 23)  Divalproex Sodium 250 Mg Tbec (Divalproex sodium) .Marland Kitchen.. 1 by mouth bid  Patient Instructions: 1)  Please schedule a follow-up appointment in 2 months. 2)  BMP prior to visit, ICD-9: 3)  depakote 995.20 4)  TSH prior to visit, ICD-9: 5)  CBC w/ Diff prior to visit, ICD-9: Prescriptions: PROMETHAZINE HCL 25 MG TABS (PROMETHAZINE HCL) 1-2 by mouth four times a day as needed nausea  #60 x 1   Entered and Authorized by:   Tresa Garter MD   Signed by:   Tresa Garter MD on 12/16/2009   Method used:   Print then Give to Patient   RxID:   [id number] METHADONE HCL 10 MG  TABS (METHADONE HCL) Take 1 tablets by mouth 4-  times per day as needed Please,  fill on or after    02/18/10      . OK to fill 1-2 day earlier when the month has 31 days in it.  #150 x 0   Entered and Authorized by:   Tresa Garter MD   Signed by:   Tresa Garter MD on 12/16/2009   Method used:   Print then Give to Patient   RxID:   [id number] METHADONE HCL 10 MG  TABS (METHADONE HCL) Take 1 tablets by mouth 4-  times per day as needed Please,  fill on or after    01/19/10      . OK to fill 1-2 day earlier when the month has 31 days in it.  #150 x 0   Entered and Authorized by:   Tresa Garter MD   Signed by:   Tresa Garter MD on 12/16/2009   Method used:   Print then Give to Patient   RxID:   [id number] DIVALPROEX SODIUM 250 MG TBEC (DIVALPROEX SODIUM) 1 by  mouth bid  #60 x 6   Entered and Authorized by:   Tresa Garter MD   Signed by:   Tresa Garter MD on 12/16/2009   Method used:   Print then Give to Patient   RxID:   [id number]

## 2010-06-06 NOTE — Assessment & Plan Note (Signed)
Summary: per check out/pt speaks russian/Rashawna will get interperter...  Medications Added POTASSIUM CHLORIDE CR 10 MEQ CR-CAPS (POTASSIUM CHLORIDE) Take one tablet by mouth daily        Visit Type:  Follow-up Primary Provider:  Sula Soda, MD  CC:  syncope.  History of Present Illness: patient is seen for followup hospitalization several months ago with syncope.  This was possibly due to orthostasis and some dehydration.  She is stabilized and had no further problems.  She does mention that she has some leg cramps at nighttime.  She is asked for some potassium.  I reviewed her labs over time and her potassium does well and on the low or low normal side.  We will give her a small amount of potassium.  Current Medications (verified): 1)  Methadone Hcl 10 Mg  Tabs (Methadone Hcl) .... Take 1 Tablets By Mouth 3  Times Per Day As Needed Please,  Fill On or After    06/07/09      . Ok To Fill 1-2 Day Earlier When The Month Has 31 Days in It. 2)  Lorazepam 1 Mg  Tabs (Lorazepam) .... As Needed 1 By Mouth Bid 3)  Nitroquick 0.4 Mg  Subl (Nitroglycerin) .... As Needed 4)  Metoprolol Succinate 25 Mg  Tb24 (Metoprolol Succinate) .... 1/2 Tab Once Daily 5)  Trimethoprim 100 Mg  Tabs (Trimethoprim) .... Once Daily 6)  Omeprazole 20 Mg  Cpdr (Omeprazole) .... Once Daily 7)  Aspir-Low 81 Mg Tbec (Aspirin) .Marland Kitchen.. 1 Once Daily Pc 8)  Zolpidem Tartrate 10 Mg  Tabs (Zolpidem Tartrate) .Marland Kitchen.. 1at Bedtime As Needed 9)  Premarin 0.625 Mg/gm Crea (Estrogens, Conjugated) .Marland Kitchen.. 1 G Pv Twice A Week 10)  Triamcinolone Acetonide 0.1 % Oint (Triamcinolone Acetonide) .... Use 1-2 Times A Day 11)  Urinary Tract Health 365-50 Mg Caps (Cranberry-Olive Leaf) .... Take One Tablet By Mouth Once Daily. 12)  Cobal-1000 1000 Mcg/ml Soln (Cyanocobalamin) .Marland Kitchen.. 1ml Subcutaneously Once Per 2 Weeks 13)  Hydrocortisone 10 Mg Tabs (Hydrocortisone) .Marland Kitchen.. 1 By Mouth Two Times A Day (Breakfast and Lunch) Pc 14)  Hydroxyzine Hcl 25 Mg  Tabs (Hydroxyzine Hcl) .Marland Kitchen.. 1 By Mouth Two Times A Day As Needed Anxiety 15)  Vitamin D (Ergocalciferol) 50000 Unit Caps (Ergocalciferol) .Marland Kitchen.. 1 By Mouth Q 1 Month 16)  Promethazine Hcl 25 Mg Tabs (Promethazine Hcl) .Marland Kitchen.. 1-2 By Mouth Four Times A Day As Needed Nausea  Allergies: 1)  ! Penicillin 2)  ! Sulfa 3)  ! Erythromycin 4)  ! Novocain 5)  ! * Ivp Dye 6)  ! Doxycycline 7)  ! Cipro 8)  ! * Potassium 9)  ! * Dicoine 10)  ! * Levomithicine 11)  ! Nubain (Nalbuphine Hcl) 12)  ! * Belladonna 13)  ! * Gabapentin 14)  Baclofen (Baclofen) 15)  Carafate (Sucralfate) 16)  Macrobid (Nitrofurantoin Monohyd Macro)  Past History:  Past Medical History: CAD .Marland Kitchencatheterization January, 2007.. patent grafts  /  hospitalization October, 2010.Marland Kitchen no MI CABG Syncope... March 28, 2009  ??orthostasis ?? Carotid artery disease... Doppler... March 30, 2009... 0-39% LICA, 60-79% RICA .Marland Kitchen increased velocity.. possibly from serpentine vessel EF  50-55%...... no wall motion abnormalities.Marland Kitchenecho March 30, 2009. Allergic rhinitis Colonic polyps, hx of Diverticulosis, colon GERD Hyperlipidemia Abd. pain...Marland KitchenMarland KitchenMarland Kitchen motility disorder, chronic functional, severe    ( Dr Juanda Chance);  Possible OCD with compulsive use of enemas and laxatives Psychosomatic disorder with GI fixation Anxiety Depression Stress incontinence Multiple drug allergies Vit D def Vit B12  def Renal insufficiency UTIs hypokalemia.... mild  Review of Systems       There is an interpreter here today.  The patient is not having any fevers, chills, headache, rash, sweats.  She has no nausea or vomiting she is not having any urinary symptoms.  All other systems are reviewed and are negative.  Vital Signs:  Patient profile:   77 year old female Height:      60 inches Weight:      147 pounds BMI:     28.81 Pulse rate:   75 / minute BP sitting:   122 / 68  (left arm) Cuff size:   regular  Vitals Entered By: Hardin Negus, RMA  (June 02, 2009 2:39 PM)  Physical Exam  General:  patient stable today. Eyes:  no xanthelasma. Neck:  no jugular venous distention. Lungs:  lungs are clear.  Respiratory effort is unlabored. Heart:  cardiac exam reveals S1 and S2.  There no clicks or significant murmurs. Abdomen:  abdomen soft. Extremities:  no peripheral edema. Psych:  patient is oriented to person time and place through her interpreter.   Impression & Recommendations:  Problem # 1:  HYPOKALEMIA (ICD-276.8) review of the labs reveal that the patient's potassium runs on the low normal side.  I will give her a small amount of potassium today.  Problem # 2:  SYNCOPE (ICD-780.2)  Her updated medication list for this problem includes:    Nitroquick 0.4 Mg Subl (Nitroglycerin) .Marland Kitchen... As needed    Metoprolol Succinate 25 Mg Tb24 (Metoprolol succinate) .Marland Kitchen... 1/2 tab once daily    Aspir-low 81 Mg Tbec (Aspirin) .Marland Kitchen... 1 once daily pc    Hydrocortisone 10 Mg Tabs (Hydrocortisone) .Marland Kitchen... 1 by mouth two times a day (breakfast and lunch) pc There is been no recurrent syncope.  She is stable.  Problem # 3:  CHEST PAIN, SUBSTERNAL (ICD-786.59)  Her updated medication list for this problem includes:    Nitroquick 0.4 Mg Subl (Nitroglycerin) .Marland Kitchen... As needed    Metoprolol Succinate 25 Mg Tb24 (Metoprolol succinate) .Marland Kitchen... 1/2 tab once daily    Aspir-low 81 Mg Tbec (Aspirin) .Marland Kitchen... 1 once daily pc The patient has had no recurrent chest pain.  Patient Instructions: 1)  Start Potassium daily 2)  Follow up in 6 months Prescriptions: POTASSIUM CHLORIDE CR 10 MEQ CR-CAPS (POTASSIUM CHLORIDE) Take one tablet by mouth daily  #30 x 6   Entered by:   Meredith Staggers, RN   Authorized by:   Talitha Givens, MD, [facility]   Signed by:   Meredith Staggers, RN on 06/02/2009   Method used:   [facility]        [facility] [address] [phone][facility] (retail)       [address]       [place], [place]  [address]        Ph: [phone] or [phone]       Fax: [phone]   RxID:   [id number]

## 2010-06-06 NOTE — Assessment & Plan Note (Signed)
Summary: 2 mo rov /nws   Vital Signs:  Patient profile:   77 year old female Weight:      160 pounds BMI:     31.36 Temp:     97.2 degrees F oral Pulse rate:   64 / minute Pulse rhythm:   regular BP sitting:   120 / 68  (left arm) Cuff size:   regular  Vitals Entered By: Raechel Ache, RN (October 14, 2009 10:21 AM) CC: ROV, c/o stomach pain.   Primary Care Provider:  Jacinta Shoe, MD  CC:  ROV and c/o stomach pain.Marland Kitchen  History of Present Illness: The patient presents for a follow up of back pain, anxiety, depression and abd pain - worse (Methadone # - not enough to hold pain thru the day...).   Allergies: 1)  ! Penicillin 2)  ! Sulfa 3)  ! Erythromycin 4)  ! Novocain 5)  ! * Ivp Dye 6)  ! Doxycycline 7)  ! Cipro 8)  ! * Potassium 9)  ! * Dicoine 10)  ! * Levomithicine 11)  ! Nubain (Nalbuphine Hcl) 12)  ! * Belladonna 13)  ! * Gabapentin 14)  ! Metronidazole (Metronidazole) 15)  Baclofen (Baclofen) 16)  Carafate (Sucralfate) 17)  Macrobid (Nitrofurantoin Monohyd Macro)  Past History:  Past Medical History: Last updated: 08/31/2009 CAD .Marland Kitchencatheterization January, 2007.. patent grafts  /  hospitalization October, 2010.Marland Kitchen no MI CABG Syncope... March 28, 2009  ??orthostasis ?? Carotid artery disease... Doppler... March 30, 2009... 0-39% LICA, 60-79% RICA .Marland Kitchen increased velocity.. possibly from serpentine vessel EF  50-55%...... no wall motion abnormalities.Marland Kitchenecho March 30, 2009. Allergic rhinitis Colonic polyps, hx of Diverticulosis, colon GERD Hyperlipidemia Abd. pain...Marland KitchenMarland KitchenMarland Kitchen motility disorder, chronic functional, severe    ( Dr Juanda Chance);  Possible OCD with compulsive use of enemas and laxatives H pylori (+) 2011 Psychosomatic disorder with GI fixation Anxiety Depression Stress incontinence Multiple drug allergies Vit D def Vit B12 def Renal insufficiency UTIs hypokalemia.... mild  Past Surgical History: Last updated: 01/30/2008 Coronary  artery bypass graft Ovarian and Uterine Fibroid tumor excision Fibroadenoma Left breast- Bilateral breast surgeyr L hemicolectomy 2005  Social History: Last updated: 08/02/2009 Retired MD Married Never Smoked Alcohol use-no The patient is an immigrant from [place].  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  She does not smoke or use alcohol, and is  currently living with her husband.   Review of Systems       The patient complains of anorexia, dyspnea on exertion, abdominal pain, severe indigestion/heartburn, muscle weakness, and difficulty walking.  The patient denies fever, melena, and hematochezia.    Physical Exam  General:  appears depressed; communicates in [name]  Chronically ill-appearing  Eyes:  No corneal or conjunctival inflammation noted. EOMI. Perrla. Nose:  External nasal examination shows no deformity or inflammation. Nasal mucosa are pink and moist without lesions or exudates. Mouth:  No deformity or lesions, dentition normal. Neck:  Supple; no masses or thyromegaly. Lungs:  Clear throughout to auscultation. Heart:  Regular rate and rhythm; no murmurs, rubs,  or bruits. Abdomen:  soft abdomen with minimal tenderness in left lower quadrant and left middle quadrant. No distention. Liver edge at costal margin. Msk:  Lumbar-sacral spine is tender to palpation over paraspinal muscles and painfull with the ROM  Extremities:  Is no peripheral edema. Neurologic:  No cranial nerve deficits noted. Station and gait are normal. Plantar reflexes are  down-going bilaterally. DTRs are symmetrical throughout. Sensory, motor and coordinative functions appear intact. Skin:  Intact without suspicious lesions or rashes Psych:  not anxious appearing, not agitated, not suicidal, depressed affect, flat affect, subdued   Impression & Recommendations:  Problem # 1:  CORONARY ARTERY DISEASE (ICD-414.00) Assessment  Unchanged  Her updated medication list for this problem includes:    Nitroquick 0.4 Mg Subl (Nitroglycerin) .Marland Kitchen... As needed    Metoprolol Succinate 25 Mg Tb24 (Metoprolol succinate) .Marland Kitchen... 1 tab once daily    Aspir-low 81 Mg Tbec (Aspirin) .Marland Kitchen... 1 once daily pc  Problem # 2:  SYNCOPE (ICD-780.2) Assessment: Improved  Problem # 3:  ABDOMINAL PAIN, CHRONIC (ICD-789.00) Assessment: Deteriorated Methadone # was increased to help w/pain. Risks vs benefits and controversies of a long term controlled substances (Methadone) use were discussed. Warned re: confusion, falls, risk of death etc  Her updated medication list for this problem includes:    Amitiza 24 Mcg Caps (Lubiprostone) .Marland Kitchen... 1-2  by mouth once daily as needed constipation    Reglan 5 Mg Tabs (Metoclopramide hcl) .Marland Kitchen... Take 1 tab ac three times a day.  Problem # 4:  CAROTID ARTERY DISEASE (ICD-433.10) Assessment: Unchanged  Her updated medication list for this problem includes:    Aspir-low 81 Mg Tbec (Aspirin) .Marland Kitchen... 1 once daily pc  Problem # 5:  B12 DEFICIENCY (ICD-266.2) Assessment: Unchanged On Rx  Complete Medication List: 1)  Methadone Hcl 10 Mg Tabs (Methadone hcl) .... Take 1 tablets by mouth 4-  times per day as needed please,  fill on or after    10/19/09      . ok to fill 1-2 day earlier when the month has 31 days in it. 2)  Lorazepam 1 Mg Tabs (Lorazepam) .... As needed 1 by mouth bid 3)  Nitroquick 0.4 Mg Subl (Nitroglycerin) .... As needed 4)  Metoprolol Succinate 25 Mg Tb24 (Metoprolol succinate) .Marland Kitchen.. 1 tab once daily 5)  Trimethoprim 100 Mg Tabs (Trimethoprim) .... Once daily 6)  Aspir-low 81 Mg Tbec (Aspirin) .Marland Kitchen.. 1 once daily pc 7)  Zolpidem Tartrate 10 Mg Tabs (Zolpidem tartrate) .Marland Kitchen.. 1at bedtime as needed 8)  Premarin 0.625 Mg/gm Crea (Estrogens, conjugated) .Marland Kitchen.. 1 g pv twice a week 9)  Triamcinolone Acetonide 0.1 % Oint (Triamcinolone acetonide) .... Use 1-2 times a day 10)  Urinary Tract Health 365-50  Mg Caps (Cranberry-olive leaf) .... Take one tablet by mouth once daily. 11)  Cobal-1000 1000 Mcg/ml Soln (Cyanocobalamin) .Marland Kitchen.. 1ml subcutaneously once per 2 weeks 12)  Vitamin D (ergocalciferol) 50000 Unit Caps (Ergocalciferol) .Marland Kitchen.. 1 by mouth q 1 month 13)  Promethazine Hcl 25 Mg Tabs (Promethazine hcl) .Marland Kitchen.. 1-2 by mouth four times a day as needed nausea 14)  Omeprazole 40 Mg Cpdr (Omeprazole) .Marland Kitchen.. 1 by mouth qam for indigestion 15)  Klor-con M10 10 Meq Cr-tabs (Potassium chloride crys cr) .Marland Kitchen.. 1 by mouth bid 16)  Carafate 1 Gm/91ml Susp (Sucralfate) .... Take 2 teaspoons by mouth two times a day 17)  Amitiza 24 Mcg Caps (Lubiprostone) .Marland Kitchen.. 1-2  by mouth once daily as needed constipation 18)  Reglan 5 Mg Tabs (Metoclopramide hcl) .... Take 1 tab ac three times a day. 19)  Omeprazole 20 Mg Cpdr (Omeprazole) .... Take 1 twice daily for 10 days. 20)  Diphenhydramine Hcl 25 Mg Caps (Diphenhydramine hcl) .Marland Kitchen.. 1-2 by mouth qid as needed allergy 21)  Lactulose 20 Gm/1ml Soln (Lactulose) .... Use 30-60 cc prn  Other Orders: Prescription Created Electronically (  [email or web address])  Patient Instructions: 1)  Gelniq one a day on skin to try 2)  Please schedule a follow-up appointment in 2 months. Prescriptions: ZOLPIDEM TARTRATE 10 MG  TABS (ZOLPIDEM TARTRATE) 1at bedtime as needed  #30 x 6   Entered and Authorized by:   Tresa Garter MD   Signed by:   Tresa Garter MD on 10/14/2009   Method used:   Print then Give to Patient   RxID:   [id number] TRIAMCINOLONE ACETONIDE 0.1 % OINT (TRIAMCINOLONE ACETONIDE) Use 1-2 times a day  #120g x 3   Entered and Authorized by:   Tresa Garter MD   Signed by:   Tresa Garter MD on 10/14/2009   Method used:   Print then Give to Patient   RxID:   [id number] PREMARIN 0.625 MG/GM CREA (ESTROGENS, CONJUGATED) 1 g pv twice a week  #qs x 12   Entered and Authorized by:   Tresa Garter MD   Signed by:   Tresa Garter MD on  10/14/2009   Method used:   Print then Give to Patient   RxID:   [id number] METHADONE HCL 10 MG  TABS (METHADONE HCL) Take 1 tablets by mouth 4-  times per day as needed Please,  fill on or after    10/19/09      . OK to fill 1-2 day earlier when the month has 31 days in it.  #150 x 0   Entered and Authorized by:   Tresa Garter MD   Signed by:   Tresa Garter MD on 10/14/2009   Method used:   Print then Give to Patient   RxID:   [id number] LACTULOSE 20 GM/30ML SOLN (LACTULOSE) use 30-60 cc prn  #300 ml x 6   Entered and Authorized by:   Tresa Garter MD   Signed by:   Tresa Garter MD on 10/14/2009   Method used:   Print then Give to Patient   RxID:   [id number] METHADONE HCL 10 MG  TABS (METHADONE HCL) Take 1 tablets by mouth 4-  times per day as needed Please,  fill on or after    11/18/09      . OK to fill 1-2 day earlier when the month has 31 days in it.  #150 x 0   Entered and Authorized by:   Tresa Garter MD   Signed by:   Tresa Garter MD on 10/14/2009   Method used:   Print then Give to Patient   RxID:   [id number] METOPROLOL SUCCINATE 25 MG  TB24 (METOPROLOL SUCCINATE) 1 tab once daily  #30 x 12   Entered and Authorized by:   Tresa Garter MD   Signed by:   Tresa Garter MD on 10/14/2009   Method used:   [facility] to        [facility] [address] [phone]* (retail)       [address]       [place], [place]  [address]       Ph: [phone] or [phone]       Fax: [phone]   RxID:   [id number]

## 2010-06-06 NOTE — Assessment & Plan Note (Signed)
Summary: rx consult-lb   Vital Signs:  Patient profile:   77 year old female Height:      60 inches Weight:      152 pounds BMI:     29.79 O2 Sat:      94 % on Room air Temp:     98.1 degrees F oral Pulse rate:   56 / minute BP sitting:   114 / 56  (left arm) Cuff size:   regular  Vitals Entered By: Lucious Groves (August 31, 2009 8:07 AM)  O2 Flow:  Room air CC: F/U--rtn ov-discuss meds./kb Is Patient Diabetic? No Pain Assessment Patient in pain? no        Primary Care Provider:  Jacinta Shoe, MD  CC:  F/U--rtn ov-discuss meds./kb.  History of Present Illness: The patient presents for a follow up of abd  pain, anxiety, depression and CAD. She is afraid of usinng H pilori Rx due to previous reactions to med.   Current Medications (verified): 1)  Methadone Hcl 10 Mg  Tabs (Methadone Hcl) .... Take 1 Tablets By Mouth 3-4  Times Per Day As Needed Please,  Fill On or After    11/04/09      . Ok To Fill 1-2 Day Earlier When The Month Has 31 Days in It. 2)  Lorazepam 1 Mg  Tabs (Lorazepam) .... As Needed 1 By Mouth Bid 3)  Nitroquick 0.4 Mg  Subl (Nitroglycerin) .... As Needed 4)  Metoprolol Succinate 25 Mg  Tb24 (Metoprolol Succinate) .... 1/2 Tab Once Daily 5)  Trimethoprim 100 Mg  Tabs (Trimethoprim) .... Once Daily 6)  Aspir-Low 81 Mg Tbec (Aspirin) .Marland Kitchen.. 1 Once Daily Pc 7)  Zolpidem Tartrate 10 Mg  Tabs (Zolpidem Tartrate) .Marland Kitchen.. 1at Bedtime As Needed 8)  Premarin 0.625 Mg/gm Crea (Estrogens, Conjugated) .Marland Kitchen.. 1 G Pv Twice A Week 9)  Triamcinolone Acetonide 0.1 % Oint (Triamcinolone Acetonide) .... Use 1-2 Times A Day 10)  Urinary Tract Health 365-50 Mg Caps (Cranberry-Olive Leaf) .... Take One Tablet By Mouth Once Daily. 11)  Cobal-1000 1000 Mcg/ml Soln (Cyanocobalamin) .Marland Kitchen.. 1ml Subcutaneously Once Per 2 Weeks 12)  Vitamin D (Ergocalciferol) 50000 Unit Caps (Ergocalciferol) .Marland Kitchen.. 1 By Mouth Q 1 Month 13)  Promethazine Hcl 25 Mg Tabs (Promethazine Hcl) .Marland Kitchen.. 1-2 By Mouth  Four Times A Day As Needed Nausea 14)  Omeprazole 40 Mg Cpdr (Omeprazole) .Marland Kitchen.. 1 By Mouth Qam For Indigestion 15)  Klor-Con M10 10 Meq Cr-Tabs (Potassium Chloride Crys Cr) .Marland Kitchen.. 1 By Mouth Bid 16)  Carafate 1 Gm/27ml Susp (Sucralfate) .... Take 2 Teaspoons By Mouth Two Times A Day 17)  Amitiza 24 Mcg Caps (Lubiprostone) .Marland Kitchen.. 1-2  By Mouth Once Daily As Needed Constipation 18)  Reglan 5 Mg  Tabs (Metoclopramide Hcl) .... Take 1 Tab Ac Three Times A Day. 19)  Omeprazole 20 Mg  Cpdr (Omeprazole) .... Take 1 Twice Daily For 10 Days. 20)  Metronidazole 250 Mg Tabs (Metronidazole) .... Take 1 By Mouth 3 Times Daily For 10 Days. 21)  Clarithromycin 500 Mg Tabs (Clarithromycin) .... Take One By Mouth 2 Times Daily For 10 Days.  Allergies (verified): 1)  ! Penicillin 2)  ! Sulfa 3)  ! Erythromycin 4)  ! Novocain 5)  ! * Ivp Dye 6)  ! Doxycycline 7)  ! Cipro 8)  ! * Potassium 9)  ! * Dicoine 10)  ! * Levomithicine 11)  ! Nubain (Nalbuphine Hcl) 12)  ! * Belladonna 13)  ! * Gabapentin  14)  Baclofen (Baclofen) 15)  Carafate (Sucralfate) 16)  Macrobid (Nitrofurantoin Monohyd Macro)  Past History:  Past Medical History: CAD .Marland Kitchencatheterization January, 2007.. patent grafts  /  hospitalization October, 2010.Marland Kitchen no MI CABG Syncope... March 28, 2009  ??orthostasis ?? Carotid artery disease... Doppler... March 30, 2009... 0-39% LICA, 60-79% RICA .Marland Kitchen increased velocity.. possibly from serpentine vessel EF  50-55%...... no wall motion abnormalities.Marland Kitchenecho March 30, 2009. Allergic rhinitis Colonic polyps, hx of Diverticulosis, colon GERD Hyperlipidemia Abd. pain...Marland KitchenMarland KitchenMarland Kitchen motility disorder, chronic functional, severe    ( Dr Juanda Chance);  Possible OCD with compulsive use of enemas and laxatives H pylori (+) 2011 Psychosomatic disorder with GI fixation Anxiety Depression Stress incontinence Multiple drug allergies Vit D def Vit B12 def Renal insufficiency UTIs hypokalemia....  mild  Physical Exam  General:  appears depressed; communicates in [name]  Chronically ill-appearing  Mouth:  No deformity or lesions, dentition normal. Lungs:  Clear throughout to auscultation. Heart:  Regular rate and rhythm; no murmurs, rubs,  or bruits. Abdomen:  soft abdomen with minimal tenderness in left lower quadrant and left middle quadrant. No distention. Liver edge at costal margin. Skin:  Intact without suspicious lesions or rashes Psych:  not anxious appearing, not agitated, not suicidal, depressed affect, flat affect, subdued   Impression & Recommendations:  Problem # 1:  HELICOBACTER PYLORI INFECTION (ICD-041.86) Assessment New Discussed Rx. She will try prescribed regimen w/food. No true h/o allergies to Biaxin and Metro.  Problem # 2:  RENAL INSUFFICIENCY (ICD-588.9) Assessment: Unchanged  Problem # 3:  DEPRESSION (ICD-311) Assessment: Unchanged  Her updated medication list for this problem includes:    Lorazepam 1 Mg Tabs (Lorazepam) .Marland Kitchen... As needed 1 by mouth bid  Problem # 4:  CORONARY ARTERY DISEASE (ICD-414.00) Assessment: Unchanged  Her updated medication list for this problem includes:    Nitroquick 0.4 Mg Subl (Nitroglycerin) .Marland Kitchen... As needed    Metoprolol Succinate 25 Mg Tb24 (Metoprolol succinate) .Marland Kitchen... 1/2 tab once daily    Aspir-low 81 Mg Tbec (Aspirin) .Marland Kitchen... 1 once daily pc  Complete Medication List: 1)  Methadone Hcl 10 Mg Tabs (Methadone hcl) .... Take 1 tablets by mouth 3-4  times per day as needed please,  fill on or after    11/04/09      . ok to fill 1-2 day earlier when the month has 31 days in it. 2)  Lorazepam 1 Mg Tabs (Lorazepam) .... As needed 1 by mouth bid 3)  Nitroquick 0.4 Mg Subl (Nitroglycerin) .... As needed 4)  Metoprolol Succinate 25 Mg Tb24 (Metoprolol succinate) .... 1/2 tab once daily 5)  Trimethoprim 100 Mg Tabs (Trimethoprim) .... Once daily 6)  Aspir-low 81 Mg Tbec (Aspirin) .Marland Kitchen.. 1 once daily pc 7)  Zolpidem  Tartrate 10 Mg Tabs (Zolpidem tartrate) .Marland Kitchen.. 1at bedtime as needed 8)  Premarin 0.625 Mg/gm Crea (Estrogens, conjugated) .Marland Kitchen.. 1 g pv twice a week 9)  Triamcinolone Acetonide 0.1 % Oint (Triamcinolone acetonide) .... Use 1-2 times a day 10)  Urinary Tract Health 365-50 Mg Caps (Cranberry-olive leaf) .... Take one tablet by mouth once daily. 11)  Cobal-1000 1000 Mcg/ml Soln (Cyanocobalamin) .Marland Kitchen.. 1ml subcutaneously once per 2 weeks 12)  Vitamin D (ergocalciferol) 50000 Unit Caps (Ergocalciferol) .Marland Kitchen.. 1 by mouth q 1 month 13)  Promethazine Hcl 25 Mg Tabs (Promethazine hcl) .Marland Kitchen.. 1-2 by mouth four times a day as needed nausea 14)  Omeprazole 40 Mg Cpdr (Omeprazole) .Marland Kitchen.. 1 by mouth qam for indigestion 15)  Klor-con M10 10 Meq  Cr-tabs (Potassium chloride crys cr) .Marland Kitchen.. 1 by mouth bid 16)  Carafate 1 Gm/70ml Susp (Sucralfate) .... Take 2 teaspoons by mouth two times a day 17)  Amitiza 24 Mcg Caps (Lubiprostone) .Marland Kitchen.. 1-2  by mouth once daily as needed constipation 18)  Reglan 5 Mg Tabs (Metoclopramide hcl) .... Take 1 tab ac three times a day. 19)  Omeprazole 20 Mg Cpdr (Omeprazole) .... Take 1 twice daily for 10 days. 20)  Metronidazole 250 Mg Tabs (Metronidazole) .... Take 1 by mouth 3 times daily for 10 days. 21)  Clarithromycin 500 Mg Tabs (Clarithromycin) .... Take one by mouth 2 times daily for 10 days. 22)  Diphenhydramine Hcl 25 Mg Caps (Diphenhydramine hcl) .Marland Kitchen.. 1-2 by mouth qid as needed allergy  Patient Instructions: 1)  Call if problems Prescriptions: DIPHENHYDRAMINE HCL 25 MG CAPS (DIPHENHYDRAMINE HCL) 1-2 by mouth qid as needed allergy  #60 x 1   Entered and Authorized by:   Tresa Garter MD   Signed by:   Tresa Garter MD on 08/31/2009   Method used:   [facility] to        [facility] [address] [phone]* (retail)       [address]       [place], [place]  [address]       Ph: [phone] or [phone]       Fax: [phone]   RxID:    [id number]

## 2010-06-06 NOTE — Letter (Signed)
Summary: EGD Instructions  [facility]  [address] [place], [place] [address]   Phone: [phone]  Fax: [phone]       [name]    1927-09-27    MRN: [id number]       Procedure Day /Date: 08/23/09 Tuesday     Arrival Time: 12:30 pm     Procedure Time: 1:30 pm     Location of Procedure:                    _x  _ [facility] (4th Floor)  PREPARATION FOR ENDOSCOPY   On 08/23/09 THE DAY OF THE PROCEDURE:  1.   No solid foods, milk or milk products are allowed after midnight the night before your procedure.  2.   Do not drink anything colored red or purple.  Avoid juices with pulp.  No orange juice.  3.  You may drink clear liquids until 11:30 am, which is 2 hours before your procedure.                                                                                                CLEAR LIQUIDS INCLUDE: Water Jello Ice Popsicles Tea (sugar ok, no milk/cream) Powdered fruit flavored drinks Coffee (sugar ok, no milk/cream) Gatorade Juice: apple, white grape, white cranberry  Lemonade Clear bullion, consomm, broth Carbonated beverages (any kind) Strained chicken noodle soup Hard Candy   MEDICATION INSTRUCTIONS  Unless otherwise instructed, you should take regular prescription medications with a small sip of water as early as possible the morning of your procedure.                   OTHER INSTRUCTIONS  You will need a responsible adult at least 75 years of age to accompany you and drive you home.   This person must remain in the waiting room during your procedure.  Wear loose fitting clothing that is easily removed.  Leave jewelry and other valuables at home.  However, you may wish to bring a book to read or an iPod/MP3 player to listen to music as you wait for your procedure to start.  Remove all body piercing jewelry and leave at home.  Total time from sign-in until discharge is approximately 2-3 hours.  You  should go home directly after your procedure and rest.  You can resume normal activities the day after your procedure.  The day of your procedure you should not:   Drive   Make legal decisions   Operate machinery   Drink alcohol   Return to work  You will receive specific instructions about eating, activities and medications before you leave.  * WE WILL HAVE A RUSSIAN INTERPRETER AVAILABLE AT THE TIME OF YOUR PROCEUDRE!  The above instructions have been reviewed and explained to me by  Hortense Ramal CMA Duncan Dull)  August 02, 2009 9:37 AM     I fully understand and can verbalize these instructions _____________________________ Date _ 08/02/09

## 2010-06-06 NOTE — Procedures (Signed)
Summary: Upper Endoscopy  Patient: [name] Note: All result statuses are Final unless otherwise noted.  Tests: (1) Upper Endoscopy (EGD)   EGD Upper Endoscopy       DONE     [facility]     [address]     [place], [place]  [address]           ENDOSCOPY PROCEDURE REPORT           PATIENT:  [name], [name]  MR#:  [id number]     BIRTHDATE:  1928/01/04, 81 yrs. old  GENDER:  female           ENDOSCOPIST:  Hedwig Morton. Juanda Chance, MD     Referred by:  Linda Hedges Plotnikov, M.D.           PROCEDURE DATE:  08/23/2009     PROCEDURE:  EGD with biopsy     ASA CLASS:  Class II     INDICATIONS:  dysphagia, abdominal pain chronis abdominal pain,     negative GI eval.     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  The [facility] [id number] endoscope was     introduced through the mouth and advanced to the second portion of     the duodenum, without limitations.  The instrument was slowly     withdrawn as the mucosa was fully examined.     <<PROCEDUREIMAGES>>           The esophagus and gastroesophageal junction were completely normal     in appearance (see image1, image2, image8, and image9). no     stricture maloney dilator 48 F Maloney dil passed without     difficulty  Mild gastritis was found. patchy erythema With     standard forceps, a biopsy was obtained and sent to pathology (see     image7, image6, image5, and image4).  Otherwise the examination     was normal (see image3).    Retroflexed views revealed no     abnormalities.    The scope was then withdrawn from the patient     and the procedure completed.           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           ______________________________     Hedwig Morton. Juanda Chance, MD           CC:  n.     eSIGNED:   Hedwig Morton. Jahyra Sukup at 08/23/2009 02:04 PM           Page 2 of 3   [name], [name], [id number]  Note: An exclamation mark (!) indicates a result that was not dispersed into the flowsheet. Document Creation Date: 08/23/2009 2:05 PM _______________________________________________________________________  (1) Order result status: Final Collection or observation date-time: 08/23/2009 13:48 Requested date-time:  Receipt date-time:  Reported date-time:  Referring Physician:   Ordering Physician: Lina Sar [phone]) Specimen Source:  Source: Launa Grill Order Number: [id number] Lab site:

## 2010-06-06 NOTE — Progress Notes (Signed)
Summary: Zolpidem pa  Phone Note Other Incoming   Caller: [facility] [place] Summary of Call: PA request--Zolpidem. Form was completed and faxed, will await insurance company reply. Initial call taken by: Lucious Groves,  November 09, 2009 11:36 AM     Appended Document: Zolpidem pa Approved until 2012.

## 2010-06-06 NOTE — Assessment & Plan Note (Signed)
Summary: 2 MTH FU---STC   Vital Signs:  Patient profile:   77 year old female Height:      60 inches Weight:      145.75 pounds BMI:     28.57 O2 Sat:      94 % on Room air Temp:     97.0 degrees F oral Pulse rate:   66 / minute BP sitting:   124 / 72  (left arm) Cuff size:   regular  Vitals Entered By: Lucious Groves (August 12, 2009 9:32 AM)  O2 Flow:  Room air CC: 2 mo f/u--Spouse states that the patient is not feeling well./kb Is Patient Diabetic? No Pain Assessment Patient in pain? no        Primary Care Provider:  Jacinta Shoe, MD  CC:  2 mo f/u--Spouse states that the patient is not feeling well./kb.  History of Present Illness: The patient presents for a follow up of severe abd pain, anxiety, depression and headaches. C/o indigestion.   Current Medications (verified): 1)  Methadone Hcl 10 Mg  Tabs (Methadone Hcl) .... Take 1 Tablets By Mouth 3-4  Times Per Day As Needed Please,  Fill On or After    501/11      . Ok To Fill 1-2 Day Earlier When The Month Has 31 Days in It. 2)  Lorazepam 1 Mg  Tabs (Lorazepam) .... As Needed 1 By Mouth Bid 3)  Nitroquick 0.4 Mg  Subl (Nitroglycerin) .... As Needed 4)  Metoprolol Succinate 25 Mg  Tb24 (Metoprolol Succinate) .... 1/2 Tab Once Daily 5)  Trimethoprim 100 Mg  Tabs (Trimethoprim) .... Once Daily 6)  Aspir-Low 81 Mg Tbec (Aspirin) .Marland Kitchen.. 1 Once Daily Pc 7)  Zolpidem Tartrate 10 Mg  Tabs (Zolpidem Tartrate) .Marland Kitchen.. 1at Bedtime As Needed 8)  Premarin 0.625 Mg/gm Crea (Estrogens, Conjugated) .Marland Kitchen.. 1 G Pv Twice A Week 9)  Triamcinolone Acetonide 0.1 % Oint (Triamcinolone Acetonide) .... Use 1-2 Times A Day 10)  Urinary Tract Health 365-50 Mg Caps (Cranberry-Olive Leaf) .... Take One Tablet By Mouth Once Daily. 11)  Cobal-1000 1000 Mcg/ml Soln (Cyanocobalamin) .Marland Kitchen.. 1ml Subcutaneously Once Per 2 Weeks 12)  Vitamin D (Ergocalciferol) 50000 Unit Caps (Ergocalciferol) .Marland Kitchen.. 1 By Mouth Q 1 Month 13)  Promethazine Hcl 25 Mg Tabs  (Promethazine Hcl) .Marland Kitchen.. 1-2 By Mouth Four Times A Day As Needed Nausea 14)  Omeprazole 40 Mg Cpdr (Omeprazole) .Marland Kitchen.. 1 By Mouth Qam For Indigestion 15)  Klor-Con M10 10 Meq Cr-Tabs (Potassium Chloride Crys Cr) .Marland Kitchen.. 1 By Mouth Bid 16)  Carafate 1 Gm/61ml Susp (Sucralfate) .... Take 2 Teaspoons By Mouth Two Times A Day  Allergies (verified): 1)  ! Penicillin 2)  ! Sulfa 3)  ! Erythromycin 4)  ! Novocain 5)  ! * Ivp Dye 6)  ! Doxycycline 7)  ! Cipro 8)  ! * Potassium 9)  ! * Dicoine 10)  ! * Levomithicine 11)  ! Nubain (Nalbuphine Hcl) 12)  ! * Belladonna 13)  ! * Gabapentin 14)  Baclofen (Baclofen) 15)  Carafate (Sucralfate) 16)  Macrobid (Nitrofurantoin Monohyd Macro)  Past History:  Past Medical History: Last updated: 06/02/2009 CAD .Marland Kitchencatheterization January, 2007.. patent grafts  /  hospitalization October, 2010.Marland Kitchen no MI CABG Syncope... March 28, 2009  ??orthostasis ?? Carotid artery disease... Doppler... March 30, 2009... 0-39% LICA, 60-79% RICA .Marland Kitchen increased velocity.. possibly from serpentine vessel EF  50-55%...... no wall motion abnormalities.Marland Kitchenecho March 30, 2009. Allergic rhinitis Colonic polyps, hx of Diverticulosis,  colon GERD Hyperlipidemia Abd. pain...Marland KitchenMarland KitchenMarland Kitchen motility disorder, chronic functional, severe    ( Dr Juanda Chance);  Possible OCD with compulsive use of enemas and laxatives Psychosomatic disorder with GI fixation Anxiety Depression Stress incontinence Multiple drug allergies Vit D def Vit B12 def Renal insufficiency UTIs hypokalemia.... mild  Social History: Last updated: 08/02/2009 Retired MD Married Never Smoked Alcohol use-no The patient is an immigrant from [place].  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  She does not smoke or use alcohol, and is  currently living with her husband.   Review of Systems       The patient complains of abdominal pain and severe  indigestion/heartburn.  The patient denies fever.    Physical Exam  General:  appears depressed; communicates in [name]  Chronically ill-appearing  Nose:  External nasal examination shows no deformity or inflammation. Nasal mucosa are pink and moist without lesions or exudates. Mouth:  No deformity or lesions, dentition normal. Lungs:  Clear throughout to auscultation. Heart:  Regular rate and rhythm; no murmurs, rubs,  or bruits. Abdomen:  soft abdomen with minimal tenderness in left lower quadrant and left middle quadrant. No distention. Liver edge at costal margin. Msk:  Lumbar-sacral spine is tender to palpation over paraspinal muscles and painfull with the ROM  Extremities:  Is no peripheral edema. Neurologic:  No cranial nerve deficits noted. Station and gait are normal. Plantar reflexes are down-going bilaterally. DTRs are symmetrical throughout. Sensory, motor and coordinative functions appear intact. Skin:  Intact without suspicious lesions or rashes Psych:  not anxious appearing, not agitated, not suicidal, depressed affect, flat affect, subdued   Impression & Recommendations:  Problem # 1:  ABDOMINAL PAIN, RIGHT UPPER QUADRANT (ICD-789.01) Assessment Unchanged Cont Rx Her updated medication list for this problem includes:    Amitiza 24 Mcg Caps (Lubiprostone) .Marland Kitchen... 1-2  by mouth once daily as needed constipation  Problem # 2:  HYPOKALEMIA (ICD-276.8) Assessment: Improved  Problem # 3:  CAROTID ARTERY DISEASE (ICD-433.10) Assessment: Unchanged  Her updated medication list for this problem includes:    Aspir-low 81 Mg Tbec (Aspirin) .Marland Kitchen... 1 once daily pc  Problem # 4:  DEPRESSION (ICD-311) Assessment: Unchanged  Her updated medication list for this problem includes:    Lorazepam 1 Mg Tabs (Lorazepam) .Marland Kitchen... As needed 1 by mouth bid  Problem # 5:  GERD (ICD-530.81) Assessment: Deteriorated  Her updated medication list for this problem includes:    Omeprazole 40  Mg Cpdr (Omeprazole) .Marland Kitchen... 1 by mouth qam for indigestion    Carafate 1 Gm/71ml Susp (Sucralfate) .Marland Kitchen... Take 2 teaspoons by mouth two times a day  Complete Medication List: 1)  Methadone Hcl 10 Mg Tabs (Methadone hcl) .... Take 1 tablets by mouth 3-4  times per day as needed please,  fill on or after    11/04/09      . ok to fill 1-2 day earlier when the month has 31 days in it. 2)  Lorazepam 1 Mg Tabs (Lorazepam) .... As needed 1 by mouth bid 3)  Nitroquick 0.4 Mg Subl (Nitroglycerin) .... As needed 4)  Metoprolol Succinate 25 Mg Tb24 (Metoprolol succinate) .... 1/2 tab once daily 5)  Trimethoprim 100 Mg Tabs (Trimethoprim) .... Once daily 6)  Aspir-low 81 Mg Tbec (Aspirin) .Marland Kitchen.. 1 once daily pc 7)  Zolpidem Tartrate 10 Mg Tabs (Zolpidem tartrate) .Marland Kitchen.. 1at bedtime as needed 8)  Premarin 0.625 Mg/gm Crea (  Estrogens, conjugated) .Marland Kitchen.. 1 g pv twice a week 9)  Triamcinolone Acetonide 0.1 % Oint (Triamcinolone acetonide) .... Use 1-2 times a day 10)  Urinary Tract Health 365-50 Mg Caps (Cranberry-olive leaf) .... Take one tablet by mouth once daily. 11)  Cobal-1000 1000 Mcg/ml Soln (Cyanocobalamin) .Marland Kitchen.. 1ml subcutaneously once per 2 weeks 12)  Vitamin D (ergocalciferol) 50000 Unit Caps (Ergocalciferol) .Marland Kitchen.. 1 by mouth q 1 month 13)  Promethazine Hcl 25 Mg Tabs (Promethazine hcl) .Marland Kitchen.. 1-2 by mouth four times a day as needed nausea 14)  Omeprazole 40 Mg Cpdr (Omeprazole) .Marland Kitchen.. 1 by mouth qam for indigestion 15)  Klor-con M10 10 Meq Cr-tabs (Potassium chloride crys cr) .Marland Kitchen.. 1 by mouth bid 16)  Carafate 1 Gm/12ml Susp (Sucralfate) .... Take 2 teaspoons by mouth two times a day 17)  Amitiza 24 Mcg Caps (Lubiprostone) .Marland Kitchen.. 1-2  by mouth once daily as needed constipation  Patient Instructions: 1)  Please schedule a follow-up appointment in 2 months. 2)  BMP prior to visit, ICD-9: 3)  Hepatic Panel prior to visit, ICD-9 789.00: 4)  Lipid Panel prior to visit, ICD-9: 5)  CBC w/ Diff prior to visit,  ICD-9: Prescriptions: AMITIZA 24 MCG CAPS (LUBIPROSTONE) 1-2  by mouth once daily as needed constipation  #60 x 12   Entered and Authorized by:   Tresa Garter MD   Signed by:   Tresa Garter MD on 08/12/2009   Method used:   Print then Give to Patient   RxID:   [id number] METHADONE HCL 10 MG  TABS (METHADONE HCL) Take 1 tablets by mouth 3-4  times per day as needed Please,  fill on or after    11/04/09      . OK to fill 1-2 day earlier when the month has 31 days in it.  #120 x 0   Entered and Authorized by:   Tresa Garter MD   Signed by:   Tresa Garter MD on 08/12/2009   Method used:   Print then Give to Patient   RxID:   [id number] LORAZEPAM 1 MG  TABS (LORAZEPAM) as needed 1 by mouth bid  #60 x 6   Entered and Authorized by:   Tresa Garter MD   Signed by:   Tresa Garter MD on 08/12/2009   Method used:   Print then Give to Patient   RxID:   [id number] METHADONE HCL 10 MG  TABS (METHADONE HCL) Take 1 tablets by mouth 3-4  times per day as needed Please,  fill on or after    10/05/09      . OK to fill 1-2 day earlier when the month has 31 days in it.  #120 x 0   Entered and Authorized by:   Tresa Garter MD   Signed by:   Tresa Garter MD on 08/12/2009   Method used:   Print then Give to Patient   RxID:   [id number]

## 2010-06-06 NOTE — Progress Notes (Signed)
Summary: Rf Ceftin  Phone Note Refill Request Message from:  Pharmacy  Ceftin 250mg  1 two times a day #10.   Method Requested: Electronic Initial call taken by: Lanier Prude, [facility]),  January 04, 2010 1:13 PM  Follow-up for Phone Call        ok to ref x1 Follow-up by: Tresa Garter MD,  January 04, 2010 1:30 PM    New/Updated Medications: CEFTIN 250 MG TABS (CEFUROXIME AXETIL) 1 by mouth bid Prescriptions: CEFTIN 250 MG TABS (CEFUROXIME AXETIL) 1 by mouth bid  #10 x 0   Entered by:   Lucious Groves CMA   Authorized by:   Tresa Garter MD   Signed by:   Lucious Groves CMA on 01/04/2010   Method used:   [facility]        [facility] [address] [phone]* (retail)       [address]       [place], [place]  [address]       Ph: [phone] or [phone]       Fax: [phone]   RxID:   [id number]

## 2010-06-06 NOTE — Assessment & Plan Note (Signed)
Summary: boil/breast/cd   Vital Signs:  Patient profile:   77 year old female Height:      60 inches Weight:      152 pounds BMI:     29.79 Temp:     98.5 degrees F oral Pulse rate:   72 / minute Pulse rhythm:   regular Resp:     16 per minute BP sitting:   128 / 80  (left arm) Cuff size:   regular  Vitals Entered By: Lanier Prude, Beverly Gust) (March 27, 2010 3:59 PM)   Primary Care Provider:  Jacinta Shoe, MD   History of Present Illness: The patient presents for a follow up of abd pain, anxiety, depression and OCD. She had seen Dr [name] for CP. She found a breast lump on L   Current Medications (verified): 1)  Methadone Hcl 10 Mg  Tabs (Methadone Hcl) .... Take 1 Tablets By Mouth 4-  Times Per Day As Needed Please,  Fill On or After    04/20/10    . Ok To Fill 1-2 Day Earlier When The Month Has 31 Days in It. 2)  Lorazepam 1 Mg  Tabs (Lorazepam) .... As Needed 1 By Mouth Two Times A Day -- Qid Prn 3)  Nitroquick 0.4 Mg  Subl (Nitroglycerin) .... As Needed 4)  Metoprolol Succinate 25 Mg  Tb24 (Metoprolol Succinate) .Marland Kitchen.. 1 Tab Once Daily 5)  Trimethoprim 100 Mg  Tabs (Trimethoprim) .... Once Daily 6)  Aspir-Low 81 Mg Tbec (Aspirin) .Marland Kitchen.. 1 Once Daily Pc 7)  Zolpidem Tartrate 10 Mg  Tabs (Zolpidem Tartrate) .Marland Kitchen.. 1at Bedtime For Insomnia Medically Necessary To Have 30 Tabs Every 30 Days 8)  Premarin 0.625 Mg/gm Crea (Estrogens, Conjugated) .Marland Kitchen.. 1 G Pv Twice A Week 9)  Triamcinolone Acetonide 0.1 % Oint (Triamcinolone Acetonide) .... Use 1-2 Times A Day 10)  Vitamin D (Ergocalciferol) 50000 Unit Caps (Ergocalciferol) .Marland Kitchen.. 1 By Mouth Q 1 Month 11)  Promethazine Hcl 25 Mg Tabs (Promethazine Hcl) .Marland Kitchen.. 1-2 By Mouth Four Times A Day As Needed Nausea 12)  Klor-Con M10 10 Meq Cr-Tabs (Potassium Chloride Crys Cr) .Marland Kitchen.. 1 By Mouth Bid 13)  Amitiza 24 Mcg Caps (Lubiprostone) .Marland Kitchen.. 1-2  By Mouth Once Daily As Needed Constipation 14)  Reglan 5 Mg  Tabs (Metoclopramide Hcl) .... Take 1 Tab  Ac Three Times A Day. 15)  Omeprazole 20 Mg  Cpdr (Omeprazole) .... Take 1 Twice Daily For 10 Days. 16)  Diphenhydramine Hcl 25 Mg Caps (Diphenhydramine Hcl) .Marland Kitchen.. 1-2 By Mouth Qid As Needed Allergy 17)  Lactulose 20 Gm/75ml Soln (Lactulose) .... Use 30-60 Cc Prn 18)  Gelnique 10 % Gel (Oxybutynin Chloride) .... Use On Skin Once Daily For Bladder Issues 19)  Divalproex Sodium 250 Mg Tbec (Divalproex Sodium) .Marland Kitchen.. 1 By Mouth Bid  Allergies (verified): 1)  ! Penicillin 2)  ! Sulfa 3)  ! Erythromycin 4)  ! Novocain 5)  ! * Ivp Dye 6)  ! Doxycycline 7)  ! Cipro 8)  ! * Potassium 9)  ! * Dicoine 10)  ! * Levomithicine 11)  ! Nubain (Nalbuphine Hcl) 12)  ! * Belladonna 13)  ! * Gabapentin 14)  ! Metronidazole (Metronidazole) 15)  Baclofen (Baclofen) 16)  Carafate (Sucralfate) 17)  Macrobid (Nitrofurantoin Monohyd Macro)  Physical Exam  General:  appears depressed; communicates in [name]  Chronically ill-appearing - not worse than before  Mouth:  No deformity or lesions, dentition normal. Breasts:  R breast with 1 cm outer quad  lump under scar L breast 7 mm lumw at lat scar end and 4x5 cm ? breast tissue in ROQ, NT Heart:  Regular rate and rhythm; no murmurs, rubs,  or bruits. Abdomen:  soft abdomen with minimal tenderness in left lower quadrant and left middle quadrant. No distention. Liver edge at costal margin. Msk:      Impression & Recommendations:  Problem # 1:  LUMP OR MASS IN BREAST (ICD-611.72) L > R Assessment New Mammo Orders: Radiology Referral (Radiology)  Complete Medication List: 1)  Methadone Hcl 10 Mg Tabs (Methadone hcl) .... Take 1 tablets by mouth up to 4  times per day as needed please,  fill on or after    05/21/10   . ok to fill 1-2 day earlier when the month has 31 days in it. 2)  Lorazepam 1 Mg Tabs (Lorazepam) .... As needed 1 by mouth two times a day -- qid prn 3)  Nitroquick 0.4 Mg Subl (Nitroglycerin) .... As needed 4)  Metoprolol Succinate 25 Mg  Tb24 (Metoprolol succinate) .Marland Kitchen.. 1 tab once daily 5)  Trimethoprim 100 Mg Tabs (Trimethoprim) .... Once daily 6)  Aspir-low 81 Mg Tbec (Aspirin) .Marland Kitchen.. 1 once daily pc 7)  Zolpidem Tartrate 10 Mg Tabs (Zolpidem tartrate) .Marland Kitchen.. 1at bedtime for insomnia medically necessary to have 30 tabs every 30 days 8)  Premarin 0.625 Mg/gm Crea (Estrogens, conjugated) .Marland Kitchen.. 1 g pv twice a week 9)  Triamcinolone Acetonide 0.1 % Oint (Triamcinolone acetonide) .... Use 1-2 times a day 10)  Vitamin D (ergocalciferol) 50000 Unit Caps (Ergocalciferol) .Marland Kitchen.. 1 by mouth q 1 month 11)  Promethazine Hcl 25 Mg Tabs (Promethazine hcl) .Marland Kitchen.. 1-2 by mouth four times a day as needed nausea 12)  Klor-con M10 10 Meq Cr-tabs (Potassium chloride crys cr) .Marland Kitchen.. 1 by mouth bid 13)  Amitiza 24 Mcg Caps (Lubiprostone) .Marland Kitchen.. 1-2  by mouth once daily as needed constipation 14)  Reglan 5 Mg Tabs (Metoclopramide hcl) .... Take 1 tab ac three times a day. 15)  Omeprazole 20 Mg Cpdr (Omeprazole) .... Take 1 twice daily for 10 days. 16)  Diphenhydramine Hcl 25 Mg Caps (Diphenhydramine hcl) .Marland Kitchen.. 1-2 by mouth qid as needed allergy 17)  Lactulose 20 Gm/54ml Soln (Lactulose) .... Use 30-60 cc prn 18)  Gelnique 10 % Gel (Oxybutynin chloride) .... Use on skin once daily for bladder issues 19)  Divalproex Sodium 250 Mg Tbec (Divalproex sodium) .Marland Kitchen.. 1 by mouth bid  Patient Instructions: 1)  Keep return office visit  Prescriptions: METHADONE HCL 10 MG  TABS (METHADONE HCL) Take 1 tablets by mouth up to 4  times per day as needed Please,  fill on or after    05/21/10   . OK to fill 1-2 day earlier when the month has 31 days in it.  #120 x 0   Entered and Authorized by:   Tresa Garter MD   Signed by:   Tresa Garter MD on 03/27/2010   Method used:   Print then Give to Patient   RxID:   [id number]    Orders Added: 1)  Radiology Referral [Radiology] 2)  Est. Patient Level III [62952]

## 2010-06-06 NOTE — Progress Notes (Signed)
Summary: Zolpidem PA  Phone Note From Pharmacy   [facility] [address] [phone]* Call For: [phone]  Summary of Call: Rec'd fax notice that Zolpidem req PA. There is a qty limit to 180 in 360 days, they will fax PA form. Initial call taken by: Lucious Groves,  May 12, 2009 2:28 PM  Follow-up for Phone Call        Maralyn Sago, please check to see if paperwork was given to you, if not I can request again.  Lucious Groves,  May 16, 2009 10:27 AM    Additional Follow-up for Phone Call Additional follow up Details #1::        Spoke with insurance company and was notified that  this was a "coding issue" and has now been fixed. PA not needed. Additional Follow-up by: Lucious Groves,  May 19, 2009 1:30 PM    Additional Follow-up for Phone Call Additional follow up Details #2::    Thx Follow-up by: Tresa Garter MD,  May 23, 2009 7:34 AM

## 2010-06-06 NOTE — Miscellaneous (Signed)
Summary: Orders Update  Clinical Lists Changes  Orders: Added new Test order of Carotid Duplex (Carotid Duplex) - Signed 

## 2010-06-06 NOTE — Progress Notes (Signed)
Summary: Treatment for H-Pylori  Medications Added OMEPRAZOLE 20 MG  CPDR (OMEPRAZOLE) Take 1 twice daily for 10 days. METRONIDAZOLE 250 MG TABS (METRONIDAZOLE) Take 1 by mouth 3 times daily for 10 days. CLARITHROMYCIN 500 MG TABS (CLARITHROMYCIN) Take one by mouth 2 times daily for 10 days.       Phone Note Outgoing Call   Call placed by: Laureen Ochs LPN,  August 29, 2009 12:36 PM Summary of Call: Pt. is positive for H-Pylori and needs treatment, pt. husband is aware.   DR.Neven Fina: Please review pt's extensive med. allergy list and advise on treatment.  Initial call taken by: Laureen Ochs LPN,  August 29, 2009 12:36 PM  Follow-up for Phone Call        The best regimen for her would be PPI two times a day, Flagyl 250 by mouth three times a day, # 30, and Bioxin 500mg , #20, x 10 days. Follow-up by: Hart Carwin MD,  August 29, 2009 12:58 PM  Additional Follow-up for Phone Call Additional follow up Details #1::        Above MD orders reviewed with patient's husband. Meds to pt. pharmacy and a phamplet on H-Pylori mailed to pt.  Pt's husband  instructed to call back as needed.  Additional Follow-up by: Laureen Ochs LPN,  August 29, 2009 1:53 PM    New/Updated Medications: OMEPRAZOLE 20 MG  CPDR (OMEPRAZOLE) Take 1 twice daily for 10 days. METRONIDAZOLE 250 MG TABS (METRONIDAZOLE) Take 1 by mouth 3 times daily for 10 days. CLARITHROMYCIN 500 MG TABS (CLARITHROMYCIN) Take one by mouth 2 times daily for 10 days. Prescriptions: CLARITHROMYCIN 500 MG TABS (CLARITHROMYCIN) Take one by mouth 2 times daily for 10 days.  #20 x 0   Entered by:   Laureen Ochs LPN   Authorized by:   Hart Carwin MD   Signed by:   Laureen Ochs LPN on 16/02/9603   Method used:   Electron[facility] to        [facility] [address] [phone]* (retail)       [address]       [place], [place]  [address]       Ph: [phone] or [phone]       Fax: [phone]   RxID:    [id number] METRONIDAZOLE 250 MG TABS (METRONIDAZOLE) Take 1 by mouth 3 times daily for 10 days.  #30 x 0   Entered by:   Laureen Ochs LPN   Authorized by:   Hart Carwin MD   Signed by:   Laureen Ochs LPN on 74/25/9563   Method used:   [facility]        [facility] [address] [phone]* (retail)       [address]       [place], [place]  [address]       Ph: [phone] or [phone]       Fax: [phone]   RxID:   [id number] OMEPRAZOLE 20 MG  CPDR (OMEPRAZOLE) Take 1 twice daily for 10 days.  #20 x 0   Entered by:   Laureen Ochs LPN   Authorized by:   Hart Carwin MD   Signed by:   Laureen Ochs LPN on 61/60/7371   Method used:   [facility]        [facility] [address] [phone]* (retail)       [address]       [place], [place]  [address]       Ph: [phone] or [phone]       Fax: [phone]   RxID:   [id number]

## 2010-06-06 NOTE — Assessment & Plan Note (Signed)
Summary: abd pain per daughter/#/cd   Vital Signs:  Patient profile:   77 year old female Weight:      161 pounds BMI:     31.56 O2 Sat:      93 % on Room air Temp:     98.9 degrees F oral Pulse rate:   70 / minute Resp:     16 per minute  Vitals Entered By: Waldron Labs, CMA(AAMA) (October 26, 2009 11:03 AM)  O2 Flow:  Room air CC: abd pain and dysuria x 3 days.  SS/CMA   Primary Care Provider:  Jacinta Shoe, MD  CC:  abd pain and dysuria x 3 days.  SS/CMA.  History of Present Illness: She is here w/her dtr and her husband. C/o weakness and almost falling. C/o pain when peeing - a lot of urgency at times C/o abd pain, constipation. C/o being tearful all the time. Taking all meds and some pain meds from [place]. C/o no relief. Family c/o her obsessions with her bowels x wks at the time - then  she would start getting obsessed w/urination x wks and so on in repeat cycles. She is seeing Dr Vonita Moss q 1 month  Current Medications (verified): 1)  Methadone Hcl 10 Mg  Tabs (Methadone Hcl) .... Take 1 Tablets By Mouth 4-  Times Per Day As Needed Please,  Fill On or After    10/19/09      . Ok To Fill 1-2 Day Earlier When The Month Has 31 Days in It. 2)  Lorazepam 1 Mg  Tabs (Lorazepam) .... As Needed 1 By Mouth Bid 3)  Nitroquick 0.4 Mg  Subl (Nitroglycerin) .... As Needed 4)  Metoprolol Succinate 25 Mg  Tb24 (Metoprolol Succinate) .Marland Kitchen.. 1 Tab Once Daily 5)  Trimethoprim 100 Mg  Tabs (Trimethoprim) .... Once Daily 6)  Aspir-Low 81 Mg Tbec (Aspirin) .Marland Kitchen.. 1 Once Daily Pc 7)  Zolpidem Tartrate 10 Mg  Tabs (Zolpidem Tartrate) .Marland Kitchen.. 1at Bedtime As Needed 8)  Premarin 0.625 Mg/gm Crea (Estrogens, Conjugated) .Marland Kitchen.. 1 G Pv Twice A Week 9)  Triamcinolone Acetonide 0.1 % Oint (Triamcinolone Acetonide) .... Use 1-2 Times A Day 10)  Urinary Tract Health 365-50 Mg Caps (Cranberry-Olive Leaf) .... Take One Tablet By Mouth Once Daily. 11)  Cobal-1000 1000 Mcg/ml Soln (Cyanocobalamin) .Marland Kitchen.. 1ml  Subcutaneously Once Per 2 Weeks 12)  Vitamin D (Ergocalciferol) 50000 Unit Caps (Ergocalciferol) .Marland Kitchen.. 1 By Mouth Q 1 Month 13)  Promethazine Hcl 25 Mg Tabs (Promethazine Hcl) .Marland Kitchen.. 1-2 By Mouth Four Times A Day As Needed Nausea 14)  Omeprazole 40 Mg Cpdr (Omeprazole) .Marland Kitchen.. 1 By Mouth Qam For Indigestion 15)  Klor-Con M10 10 Meq Cr-Tabs (Potassium Chloride Crys Cr) .Marland Kitchen.. 1 By Mouth Bid 16)  Carafate 1 Gm/36ml Susp (Sucralfate) .... Take 2 Teaspoons By Mouth Two Times A Day 17)  Amitiza 24 Mcg Caps (Lubiprostone) .Marland Kitchen.. 1-2  By Mouth Once Daily As Needed Constipation 18)  Reglan 5 Mg  Tabs (Metoclopramide Hcl) .... Take 1 Tab Ac Three Times A Day. 19)  Omeprazole 20 Mg  Cpdr (Omeprazole) .... Take 1 Twice Daily For 10 Days. 20)  Diphenhydramine Hcl 25 Mg Caps (Diphenhydramine Hcl) .Marland Kitchen.. 1-2 By Mouth Qid As Needed Allergy 21)  Lactulose 20 Gm/57ml Soln (Lactulose) .... Use 30-60 Cc Prn  Allergies (verified): 1)  ! Penicillin 2)  ! Sulfa 3)  ! Erythromycin 4)  ! Novocain 5)  ! * Ivp Dye 6)  ! Doxycycline 7)  ! Cipro  8)  ! * Potassium 9)  ! * Dicoine 10)  ! * Levomithicine 11)  ! Nubain (Nalbuphine Hcl) 12)  ! * Belladonna 13)  ! * Gabapentin 14)  ! 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  She does not smoke or use alcohol, and is  currently living with her husband.   Past Medical History: CAD .Marland Kitchencatheterization January, 2007.. patent grafts  /   hospitalization October, 2010.Marland Kitchen no MI CABG Syncope... March 28, 2009  ??orthostasis ?? Carotid artery disease... Doppler... March 30, 2009... 0-39% LICA, 60-79% RICA .Marland Kitchen increased velocity.. possibly from serpentine vessel EF  50-55%...... no wall motion abnormalities.Marland Kitchenecho March 30, 2009. Allergic rhinitis Colonic polyps, hx of Diverticulosis, colon GERD Hyperlipidemia Abd. pain...Marland KitchenMarland KitchenMarland Kitchen motility disorder, chronic functional, severe    ( Dr Juanda Chance);  Possible OCD with compulsive use of enemas and laxatives; psyhotic feaures 2011 H pylori (+) 2011 Psychosomatic disorder with GI fixation Anxiety Depression Stress incontinence Multiple drug allergies Vit D def Vit B12 def Renal insufficiency UTIs hypokalemia.... mild  Review of Systems       The patient complains of weight loss, hoarseness, dyspnea on exertion, abdominal pain, severe indigestion/heartburn, muscle weakness, difficulty walking, and depression.    Physical Exam  General:  appears depressed; communicates in [name]  Chronically ill-appearing -worse than before  Head:  Normocephalic and atraumatic without obvious abnormalities. No apparent alopecia or balding. Eyes:  watery bags under eyes Ears:  WNL Nose:  External nasal examination shows no deformity or inflammation. Nasal mucosa are pink and moist without lesions or exudates. Mouth:  No deformity or lesions, dentition normal. Neck:  Supple; no masses or thyromegaly. Lungs:  Clear throughout to auscultation. Heart:  Regular rate and rhythm; no murmurs, rubs,  or bruits. Abdomen:  soft abdomen with minimal tenderness in left lower quadrant and left middle quadrant. No distention. Liver edge at costal margin. Msk:  Lumbar-sacral spine is tender to palpation over paraspinal muscles and painfull with the ROM  Extremities:  trace left pedal edema and trace right pedal edema.   Neurologic:  Ataxic Skin:  Intact without suspicious lesions or rashes Psych:   anxious appearing, not agitated, not suicidal, depressed affect, flat affect, subduedtearful.     Impression & Recommendations:  Problem # 1:  DEPENDENCE, DRUG NOS, CONTINUOUS (ICD-304.91) Assessment Unchanged Discussed The office visit took longer than 45 min with patient councelling for more than 50% of the 45 min - it is a very complex case  Problem # 2:  PSYCHOSIS (ICD-298.9) due to #3 and #1 Assessment: Deteriorated Start Depakote. Risks vs benefits and controversies of a long term depakote use were discussed. I offered them refferals to see specialists...  Problem # 3:  OBSESSIVE-COMPULSIVE DISORDER (ICD-300.3) Assessment: Deteriorated I spoke w/family and pt. I suggested to see a russian Advertising account executive in [place].  I would have suggested inpatient Psych treatment if she could speak [place]. It is still an option if an interpretur is provided.   Prognosis - poor...  Problem # 4:  HYPONATREMIA (ICD-276.1)  due to enemas and water drinking in excess Assessment: Deteriorated Stop enemas! Fluid restriction discussed. Risk of seizures or death if noncompliane continiues - discussed  Problem # 5:  ABDOMINAL PAIN, RIGHT UPPER QUADRANT (ICD-789.01) and diffuse Assessment: Unchanged  Problem # 6:  FREQUENCY, URINARY (ICD-788.41) - psychosomatic Assessment: Deteriorated Chronic UTI - no symptoms w/abn UAs in  the past Her updated medication list for this problem includes:    Gelnique 10 % Gel (Oxybutynin chloride) ..... Use on skin once daily for bladder issues  Complete Medication List: 1)  Methadone Hcl 10 Mg Tabs (Methadone hcl) .... Take 1 tablets by mouth 4-  times per day as needed please,  fill on or after    10/19/09      . ok to fill 1-2 day earlier when the month has 31 days in it. 2)  Lorazepam 1 Mg Tabs (Lorazepam) .... As needed 1 by mouth two times a day -- qid prn 3)  Nitroquick 0.4 Mg Subl (Nitroglycerin) .... As needed 4)  Metoprolol Succinate 25 Mg Tb24  (Metoprolol succinate) .Marland Kitchen.. 1 tab once daily 5)  Trimethoprim 100 Mg Tabs (Trimethoprim) .... Once daily 6)  Aspir-low 81 Mg Tbec (Aspirin) .Marland Kitchen.. 1 once daily pc 7)  Zolpidem Tartrate 10 Mg Tabs (Zolpidem tartrate) .Marland Kitchen.. 1at bedtime as needed 8)  Premarin 0.625 Mg/gm Crea (Estrogens, conjugated) .Marland Kitchen.. 1 g pv twice a week 9)  Triamcinolone Acetonide 0.1 % Oint (Triamcinolone acetonide) .... Use 1-2 times a day 10)  Urinary Tract Health 365-50 Mg Caps (Cranberry-olive leaf) .... Take one tablet by mouth once daily. 11)  Cobal-1000 1000 Mcg/ml Soln (Cyanocobalamin) .Marland Kitchen.. 1ml subcutaneously once per 2 weeks 12)  Vitamin D (ergocalciferol) 50000 Unit Caps (Ergocalciferol) .Marland Kitchen.. 1 by mouth q 1 month 13)  Promethazine Hcl 25 Mg Tabs (Promethazine hcl) .Marland Kitchen.. 1-2 by mouth four times a day as needed nausea 14)  Omeprazole 40 Mg Cpdr (Omeprazole) .Marland Kitchen.. 1 by mouth qam for indigestion 15)  Klor-con M10 10 Meq Cr-tabs (Potassium chloride crys cr) .Marland Kitchen.. 1 by mouth bid 16)  Carafate 1 Gm/2ml Susp (Sucralfate) .... Take 2 teaspoons by mouth two times a day 17)  Amitiza 24 Mcg Caps (Lubiprostone) .Marland Kitchen.. 1-2  by mouth once daily as needed constipation 18)  Reglan 5 Mg Tabs (Metoclopramide hcl) .... Take 1 tab ac three times a day. 19)  Omeprazole 20 Mg Cpdr (Omeprazole) .... Take 1 twice daily for 10 days. 20)  Diphenhydramine Hcl 25 Mg Caps (Diphenhydramine hcl) .Marland Kitchen.. 1-2 by mouth qid as needed allergy 21)  Lactulose 20 Gm/47ml Soln (Lactulose) .... Use 30-60 cc prn 22)  Divalproex Sodium 125 Mg Tbec (Divalproex sodium) .Marland Kitchen.. 1 by mouth bid 23)  Gelnique 10 % Gel (Oxybutynin chloride) .... Use on skin once daily for bladder issues  Other Orders: TLB-Udip ONLY (81003-UDIP) TLB-B12, Serum-Total ONLY (52841-L24) TLB-Sedimentation Rate (ESR) (85652-ESR) TLB-BMP (Basic Metabolic Panel-BMET) (80048-METABOL)  Patient Instructions: 1)  Please schedule a follow-up appointment in 1-2 weeks. 2)  Call if you are not better in a  reasonable amount of time or if worse. Go to ER if feeling really bad!  Prescriptions: GELNIQUE 10 % GEL (OXYBUTYNIN CHLORIDE) use on skin once daily for bladder issues  #30 x 3   Entered and Authorized by:   Tresa Garter MD   Signed by:   Tresa Garter MD on 10/26/2009   Method used:   Print then Give to Patient   RxID:   [id number] LORAZEPAM 1  MG  TABS (LORAZEPAM) as needed 1 by mouth two times a day -- qid prn  #120 x 6   Entered and Authorized by:   Tresa Garter MD   Signed by:   Tresa Garter MD on 10/26/2009   Method used:   Print then Give to Patient   RxID:   [id number] DIVALPROEX SODIUM 125 MG TBEC (DIVALPROEX SODIUM) 1 by mouth bid  #60 x 6   Entered and Authorized by:   Tresa Garter MD   Signed by:   Tresa Garter MD on 10/26/2009   Method used:   Print then Give to Patient   RxID:   [id number]

## 2010-06-06 NOTE — Assessment & Plan Note (Signed)
Summary: 2 MO ROV /NWS  #   Vital Signs:  Patient profile:   77 year old female Height:      60 inches Weight:      151 pounds BMI:     29.60 Temp:     97.5 degrees F oral Pulse rate:   76 / minute Pulse rhythm:   regular Resp:     16 per minute BP sitting:   132 / 76  (left arm) Cuff size:   regular  Vitals Entered By: Lanier Prude, CMA(AAMA) (February 16, 2010 9:07 AM) CC: 2 mo f/u   Primary Care Taiden Raybourn:  Jacinta Shoe, MD  CC:  2 mo f/u.  History of Present Illness: The patient presents for a post-ER visit for urinary retention   Allergies: 1)  ! Penicillin 2)  ! Sulfa 3)  ! Erythromycin 4)  ! Novocain 5)  ! * Ivp Dye 6)  ! Doxycycline 7)  ! Cipro 8)  ! * Potassium 9)  ! * Dicoine 10)  ! * Levomithicine 11)  ! Nubain (Nalbuphine Hcl) 12)  ! * Belladonna 13)  ! * Gabapentin 14)  ! Metronidazole (Metronidazole) 15)  Baclofen (Baclofen) 16)  Carafate (Sucralfate) 17)  Macrobid (Nitrofurantoin Monohyd Macro)  Past History:  Past Medical History: Last updated: 11/16/2009 CAD .Marland Kitchencatheterization January, 2007.. patent grafts  /  hospitalization October, 2010.Marland Kitchen no MI CABG Syncope... March 28, 2009  ??orthostasis ?? Carotid artery disease... Doppler... March 30, 2009... 0-39% LICA, 60-79% RICA .Marland Kitchen increased velocity.. possibly from serpentine vessel EF  50-55%...... no wall motion abnormalities.Marland Kitchenecho March 30, 2009. Allergic rhinitis Colonic polyps, hx of Diverticulosis, colon GERD Hyperlipidemia Abd. pain...Marland KitchenMarland KitchenMarland Kitchen motility disorder, chronic functional, severe    ( Dr Juanda Chance);  Possible OCD with compulsive use of enemas and laxatives; psyhotic feaures 2011 H pylori (+) 2011 Psychosomatic disorder with GI fixation Anxiety Depression Stress incontinence Multiple drug allergies Vit D def Vit B12 def Renal insufficiency UTIs hypokalemia.... 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  She does not smoke or use alcohol, and is  currently living with her husband.   Review of Systems       The patient complains of dyspnea on exertion, abdominal pain, difficulty walking, and depression.  The patient denies anorexia, fever, weight loss, melena, and hematochezia.    Physical Exam  General:  appears depressed; communicates in [name]  Chronically ill-appearing - not worse than before  Nose:  External nasal examination shows no deformity or inflammation. Nasal mucosa are pink and moist without lesions or exudates. Mouth:  No deformity or lesions, dentition normal. Lungs:  Clear throughout to auscultation. Heart:  Regular rate and rhythm; no murmurs, rubs,  or bruits. Abdomen:  soft abdomen with minimal tenderness in left lower quadrant and left middle quadrant. No distention. Liver edge at costal margin. Msk:  Lumbar-sacral spine is tender to palpation over paraspinal muscles and painfull with the ROM  Extremities:  trace left pedal edema and trace right pedal edema.   Neurologic:  Ataxic Skin:  Intact without suspicious lesions or rashes Psych:  anxious appearing, not agitated, not suicidal,  depressed affect, flat affect, subdued and tearful.     Impression & Recommendations:  Problem # 1:  URINARY RETENTION (ICD-788.20) Assessment Deteriorated She went to [facility] and was catheterized. Then she went to see Dr Vonita Moss. UA was nl. She was given meds  Problem # 2:  HYPONATREMIA (ICD-276.1) Assessment: Unchanged Na was 128 on 9/11  Problem # 3:  OBSESSIVE-COMPULSIVE DISORDER (ICD-300.3) Assessment:  Comment Only Risks of noncompliance with treatment (she nas not been taking Devalporex) discussed. Compliance encouraged. She refused Psych consult w/russian psychiatrist in [place]  Problem # 4:  HYPOTENSION (ICD-458.9) Assessment: Improved  Problem # 5:  B12 DEFICIENCY (ICD-266.2) Assessment: Improved  Orders: Vit B12 1000 mcg (J3420) Admin of Therapeutic Inj  intramuscular or subcutaneous (95621)  Problem # 6:  INSOMNIA, CHRONIC (ICD-307.42) Assessment: Unchanged  Complete Medication List: 1)  Methadone Hcl 10 Mg Tabs (Methadone hcl) .... Take 1 tablets by mouth 4-  times per day as needed please,  fill on or after    04/20/10    . ok to fill 1-2 day earlier when the month has 31 days in it. 2)  Lorazepam 1 Mg Tabs (Lorazepam) .... As needed 1 by mouth two times a day -- qid prn 3)  Nitroquick 0.4 Mg Subl (Nitroglycerin) .... As needed 4)  Metoprolol Succinate 25 Mg Tb24 (Metoprolol succinate) .Marland Kitchen.. 1 tab once daily 5)  Trimethoprim 100 Mg Tabs (Trimethoprim) .... Once daily 6)  Aspir-low 81 Mg Tbec (Aspirin) .Marland Kitchen.. 1 once daily pc 7)  Zolpidem Tartrate 10 Mg Tabs (Zolpidem tartrate) .Marland Kitchen.. 1at bedtime for insomnia medically necessary to have 30 tabs every 30 days 8)  Premarin 0.625 Mg/gm Crea (Estrogens, conjugated) .Marland Kitchen.. 1 g pv twice a week 9)  Triamcinolone Acetonide 0.1 % Oint (Triamcinolone acetonide) .... Use 1-2 times a day 10)  Cobal-1000 1000 Mcg/ml Soln (Cyanocobalamin) .Marland Kitchen.. 1ml subcutaneously once per 2 weeks 11)  Vitamin D (ergocalciferol) 50000 Unit Caps (Ergocalciferol) .Marland Kitchen.. 1 by mouth q 1 month 12)  Promethazine Hcl 25 Mg Tabs (Promethazine hcl) .Marland Kitchen.. 1-2 by mouth four times a day as needed nausea 13)  Omeprazole 40 Mg Cpdr (Omeprazole) .Marland Kitchen.. 1 by mouth qam for indigestion 14)  Klor-con M10 10 Meq Cr-tabs (Potassium chloride crys cr) .Marland Kitchen.. 1 by mouth bid 15)  Amitiza 24 Mcg Caps (Lubiprostone) .Marland Kitchen.. 1-2  by mouth once daily as needed constipation 16)  Reglan 5 Mg Tabs  (Metoclopramide hcl) .... Take 1 tab ac three times a day. 17)  Omeprazole 20 Mg Cpdr (Omeprazole) .... Take 1 twice daily for 10 days. 18)  Diphenhydramine Hcl 25 Mg Caps (Diphenhydramine hcl) .Marland Kitchen.. 1-2 by mouth qid as needed allergy 19)  Lactulose 20 Gm/7ml Soln (Lactulose) .... Use 30-60 cc prn 20)  Gelnique 10 % Gel (Oxybutynin chloride) .... Use on skin once daily for bladder issues 21)  Divalproex Sodium 250 Mg Tbec (Divalproex sodium) .Marland Kitchen.. 1 by mouth bid  Patient Instructions: 1)  Please schedule a follow-up appointment in 2 months. 2)  BMP prior to visit, ICD-9: 3)  Hepatic Panel prior to visit, ICD-9:280.9  789.00 4)  depakote 995.20 Prescriptions: ZOLPIDEM TARTRATE 10 MG  TABS (ZOLPIDEM TARTRATE) 1at bedtime for insomnia Medically necessary to have 30 tabs every 30 days  #30 x 6   Entered and Authorized by:   Tresa Garter MD   Signed by:   Tresa Garter MD on 02/16/2010   Method used:   Print then Give to Patient   RxID:  [id number] LORAZEPAM 1 MG  TABS (LORAZEPAM) as needed 1 by mouth two times a day -- qid prn  #120 x 6   Entered and Authorized by:   Tresa Garter MD   Signed by:   Tresa Garter MD on 02/16/2010   Method used:   Print then Give to Patient   RxID:   [id number] METHADONE HCL 10 MG  TABS (METHADONE HCL) Take 1 tablets by mouth 4-  times per day as needed Please,  fill on or after    04/20/10    . OK to fill 1-2 day earlier when the month has 31 days in it.  #150 x 0   Entered and Authorized by:   Tresa Garter MD   Signed by:   Tresa Garter MD on 02/16/2010   Method used:   Print then Give to Patient   RxID:   [id number] METHADONE HCL 10 MG  TABS (METHADONE HCL) Take 1 tablets by mouth 4-  times per day as needed Please,  fill on or after    03/21/10    . OK to fill 1-2 day earlier when the month has 31 days in it.  #150 x 0   Entered and Authorized by:   Tresa Garter MD   Signed by:   Tresa Garter MD on 02/16/2010   Method used:   Print then Give to Patient   RxID:   [id number]    Medication Administration  Injection # 1:    Medication: Vit B12 1000 mcg    Diagnosis: B12 DEFICIENCY (ICD-266.2)    Route: IM    Site: R deltoid    Exp Date: 11/05/2011    Lot #: [id number]    Mfr: [employer]    Patient tolerated injection without complications    Given by: Lamar Sprinkles, CMA (February 16, 2010 10:31 AM)  Orders Added: 1)  Vit B12 1000 mcg [J3420] 2)  Admin of Therapeutic Inj  intramuscular or subcutaneous [96372] 3)  Est. Patient Level IV [64332]

## 2010-06-06 NOTE — Letter (Signed)
Summary: CMN for walker/[facility]  CMN for walker/[facility]   Imported By: Sherian Rein 08/03/2009 11:38:25  _____________________________________________________________________  External Attachment:    Type:   Image     Comment:   External Document

## 2010-06-06 NOTE — Assessment & Plan Note (Signed)
Summary: f18m/interperter set up with Rashanda with so. work/09-26-09/saf      Allergies Added:   Visit Type:  Follow-up Primary Provider:  Jacinta Shoe, MD  CC:  CAD.  History of Present Illness: The patient is seen for follow up of coronary disease.  She has multiple symptoms over time.  Recently she had some chest discomfort approximately 2 weeks ago.  She has taken some nitroglycerin.  On 2 occasions she's had some weakness and slumped to the floor.  There was no definite syncope.  Current Medications (verified): 1)  Methadone Hcl 10 Mg  Tabs (Methadone Hcl) .... Take 1 Tablets By Mouth 4-  Times Per Day As Needed Please,  Fill On or After    10/19/09      . Ok To Fill 1-2 Day Earlier When The Month Has 31 Days in It. 2)  Lorazepam 1 Mg  Tabs (Lorazepam) .... As Needed 1 By Mouth Two Times A Day -- Qid Prn 3)  Nitroquick 0.4 Mg  Subl (Nitroglycerin) .... As Needed 4)  Metoprolol Succinate 25 Mg  Tb24 (Metoprolol Succinate) .Marland Kitchen.. 1 Tab Once Daily 5)  Trimethoprim 100 Mg  Tabs (Trimethoprim) .... Once Daily 6)  Aspir-Low 81 Mg Tbec (Aspirin) .Marland Kitchen.. 1 Once Daily Pc 7)  Zolpidem Tartrate 10 Mg  Tabs (Zolpidem Tartrate) .Marland Kitchen.. 1at Bedtime For Insomnia Medically Necessary To Have 30 Tabs Every 30 Days 8)  Premarin 0.625 Mg/gm Crea (Estrogens, Conjugated) .Marland Kitchen.. 1 G Pv Twice A Week 9)  Triamcinolone Acetonide 0.1 % Oint (Triamcinolone Acetonide) .... Use 1-2 Times A Day 10)  Urinary Tract Health 365-50 Mg Caps (Cranberry-Olive Leaf) .... Take One Tablet By Mouth Once Daily. 11)  Cobal-1000 1000 Mcg/ml Soln (Cyanocobalamin) .Marland Kitchen.. 1ml Subcutaneously Once Per 2 Weeks 12)  Vitamin D (Ergocalciferol) 50000 Unit Caps (Ergocalciferol) .Marland Kitchen.. 1 By Mouth Q 1 Month 13)  Promethazine Hcl 25 Mg Tabs (Promethazine Hcl) .Marland Kitchen.. 1-2 By Mouth Four Times A Day As Needed Nausea 14)  Omeprazole 40 Mg Cpdr (Omeprazole) .Marland Kitchen.. 1 By Mouth Qam For Indigestion 15)  Klor-Con M10 10 Meq Cr-Tabs (Potassium Chloride Crys Cr) .Marland Kitchen..  1 By Mouth Bid 16)  Carafate 1 Gm/52ml Susp (Sucralfate) .... Take 2 Teaspoons By Mouth Two Times A Day 17)  Amitiza 24 Mcg Caps (Lubiprostone) .Marland Kitchen.. 1-2  By Mouth Once Daily As Needed Constipation 18)  Reglan 5 Mg  Tabs (Metoclopramide Hcl) .... Take 1 Tab Ac Three Times A Day. 19)  Omeprazole 20 Mg  Cpdr (Omeprazole) .... Take 1 Twice Daily For 10 Days. 20)  Diphenhydramine Hcl 25 Mg Caps (Diphenhydramine Hcl) .Marland Kitchen.. 1-2 By Mouth Qid As Needed Allergy 21)  Lactulose 20 Gm/18ml Soln (Lactulose) .... Use 30-60 Cc Prn 22)  Divalproex Sodium 125 Mg Tbec (Divalproex Sodium) .Marland Kitchen.. 1 By Mouth Bid 23)  Gelnique 10 % Gel (Oxybutynin Chloride) .... Use On Skin Once Daily For Bladder Issues  Allergies (verified): 1)  ! Penicillin 2)  ! Sulfa 3)  ! Erythromycin 4)  ! Novocain 5)  ! * Ivp Dye 6)  ! Doxycycline 7)  ! Cipro 8)  ! * Potassium 9)  ! * Dicoine 10)  ! * Levomithicine 11)  ! Nubain (Nalbuphine Hcl) 12)  ! * Belladonna 13)  ! * Gabapentin 14)  ! Metronidazole (Metronidazole) 15)  Baclofen (Baclofen) 16)  Carafate (Sucralfate) 17)  Macrobid (Nitrofurantoin Monohyd Macro)  Past History:  Past Medical History: CAD .Marland Kitchencatheterization January, 2007.. patent grafts  /  hospitalization October, 2010.Marland Kitchen  no MI CABG Syncope... March 28, 2009  ??orthostasis ?? Carotid artery disease... Doppler... March 30, 2009... 0-39% LICA, 60-79% RICA .Marland Kitchen increased velocity.. possibly from serpentine vessel EF  50-55%...... no wall motion abnormalities.Marland Kitchenecho March 30, 2009. Allergic rhinitis Colonic polyps, hx of Diverticulosis, colon GERD Hyperlipidemia Abd. pain...Marland KitchenMarland KitchenMarland Kitchen motility disorder, chronic functional, severe    ( Dr Juanda Chance);  Possible OCD with compulsive use of enemas and laxatives; psyhotic feaures 2011 H pylori (+) 2011 Psychosomatic disorder with GI fixation Anxiety Depression Stress incontinence Multiple drug allergies Vit D def Vit B12 def Renal  insufficiency UTIs hypokalemia.... mild Insomnia Hyponatremia 2011  Review of Systems       Patient denies fever, chills, headache, sweats, rash, change in vision, change in hearing, cough, nausea vomiting, urinary symptoms.  All other systems are reviewed and are negative.  The data is obtained through the interpreter who is here today.  Vital Signs:  Patient profile:   77 year old female Height:      60 inches Weight:      157 pounds BMI:     30.77 Pulse rate:   60 / minute BP sitting:   130 / 72  (left arm) Cuff size:   regular  Vitals Entered By: Hardin Negus, RMA (November 16, 2009 9:45 AM)  Physical Exam  General:  patient is stable. Eyes:  no xanthelasma. Neck:  no jugular venous extension. Lungs:  lungs are clear. respiratory effort is not labored. Heart:  cardiac exam reveals S1-S2.  No clicks or significant murmurs. Abdomen:  abdomen is soft. Extremities:  no peripheral edema. Psych:  patient is oriented to person time and place.  Affect is normal.  She communicates well on her own in addition to the help from the interpreter.   Impression & Recommendations:  Problem # 1:  OBSESSIVE-COMPULSIVE DISORDER (ICD-300.3) Behavioral problem affects her.  Her cardiac status is stable.  Problem # 2:  CAROTID ARTERY DISEASE (ICD-433.10) The patient has known disease.  She will be scheduled for one year followup carotid Doppler in November.  Problem # 3:  CORONARY ARTERY DISEASE (ICD-414.00)  Her updated medication list for this problem includes:    Nitroquick 0.4 Mg Subl (Nitroglycerin) .Marland Kitchen... As needed    Metoprolol Succinate 25 Mg Tb24 (Metoprolol succinate) .Marland Kitchen... 1 tab once daily    Aspir-low 81 Mg Tbec (Aspirin) .Marland Kitchen... 1 once daily pc Coronary disease stable.  EKG is done today and reviewed by me.  There is normal sinus rhythm.  No significant changes.  No further workup indicated.  I carefully instructed the patient and her husband to have her limit nitroglycerin 2  situations only when she has significant ongoing chest pain.  Also should be done while she is sitting or lying.  She should not take nitroglycerin standing.  We'll see her back in 6 months.  Other Orders: EKG w/ Interpretation (93000)  Patient Instructions: 1)  Your physician wants you to follow-up in:  6 months.  You will receive a reminder letter in the mail two months in advance. If you don't receive a letter, please call our office to schedule the follow-up appointment.

## 2010-06-06 NOTE — Progress Notes (Signed)
Summary: Ceftin Rf  Phone Note From Pharmacy   Summary of Call: Ceftin 250mg  1 two times a day #10 Initial call taken by: Lanier Prude, [facility]),  February 08, 2010 2:36 PM  Follow-up for Phone Call        ok to ref Follow-up by: Tresa Garter MD,  February 08, 2010 1:08 PM    New/Updated Medications: CEFTIN 250 MG TABS (CEFUROXIME AXETIL) 1 two times a day x 10 days Prescriptions: CEFTIN 250 MG TABS (CEFUROXIME AXETIL) 1 two times a day x 10 days  #20 x 0   Entered by:   Lamar Sprinkles, CMA   Authorized by:   Tresa Garter MD   Signed by:   Lamar Sprinkles, CMA on 02/08/2010   Method used:   [facility]        [facility] [address] [phone]* (retail)       [address]       [place], [place]  [address]       Ph: [phone] or [phone]       Fax: [phone]   RxID:   [id number]

## 2010-06-06 NOTE — Assessment & Plan Note (Signed)
Summary: abd & back pain...as.    History of Present Illness Visit Type: Follow-up Visit Primary GI MD: Lina Sar MD Primary Provider: Jacinta Shoe, MD Requesting Provider: n/a Chief Complaint: Lower abd pain, and back pain History of Present Illness:   This is a 77 year old, [name] non-English speaking female with chronic pain syndrome, chronic constipation and colonic hypomotility. She is focused on her colonic function and has chronic rectal pain. 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 She has had trouble swallowing while taking omeprazole 40 mg once a day. She has heaviness and belching. She also feels there is a knot in her rectum after she uses enemas.   GI Review of Systems    Reports abdominal pain.     Location of  Abdominal pain: lower abdomen.    Denies acid reflux, belching, bloating, chest pain, dysphagia with liquids, dysphagia with solids, heartburn, loss of appetite, nausea, vomiting, vomiting blood, weight loss, and  weight gain.        Denies anal fissure, black tarry stools, change in bowel habit, constipation, diarrhea, diverticulosis, fecal incontinence, heme positive stool, hemorrhoids, irritable bowel syndrome, jaundice, light color stool, liver problems, rectal bleeding, and  rectal pain.    Current Medications (verified): 1)  Methadone Hcl 10 Mg  Tabs (Methadone Hcl) .... Take 1 Tablets By Mouth 3-4  Times Per Day As Needed Please,  Fill On or After    501/11      . Ok To Fill 1-2 Day Earlier When The Month Has 31 Days in It. 2)  Lorazepam 1 Mg  Tabs (Lorazepam) .... As Needed 1 By Mouth Bid 3)  Nitroquick 0.4 Mg  Subl (Nitroglycerin) .... As Needed 4)  Metoprolol Succinate 25 Mg  Tb24 (Metoprolol Succinate) .... 1/2 Tab Once Daily 5)  Trimethoprim 100 Mg  Tabs (Trimethoprim) .... Once Daily 6)  Aspir-Low 81 Mg Tbec (Aspirin) .Marland Kitchen.. 1 Once Daily Pc 7)  Zolpidem Tartrate 10 Mg   Tabs (Zolpidem Tartrate) .Marland Kitchen.. 1at Bedtime As Needed 8)  Premarin 0.625 Mg/gm Crea (Estrogens, Conjugated) .Marland Kitchen.. 1 G Pv Twice A Week 9)  Triamcinolone Acetonide 0.1 % Oint (Triamcinolone Acetonide) .... Use 1-2 Times A Day 10)  Urinary Tract Health 365-50 Mg Caps (Cranberry-Olive Leaf) .... Take One Tablet By Mouth Once Daily. 11)  Cobal-1000 1000 Mcg/ml Soln (Cyanocobalamin) .Marland Kitchen.. 1ml Subcutaneously Once Per 2 Weeks 12)  Vitamin D (Ergocalciferol) 50000 Unit Caps (Ergocalciferol) .Marland Kitchen.. 1 By Mouth Q 1 Month 13)  Promethazine Hcl 25 Mg Tabs (Promethazine Hcl) .Marland Kitchen.. 1-2 By Mouth Four Times A Day As Needed Nausea 14)  Omeprazole 40 Mg Cpdr (Omeprazole) .Marland Kitchen.. 1 By Mouth Qam For Indigestion 15)  Klor-Con M10 10 Meq Cr-Tabs (Potassium Chloride Crys Cr) .Marland Kitchen.. 1 By Mouth Bid  Allergies (verified): 1)  ! Penicillin 2)  ! Sulfa 3)  ! Erythromycin 4)  ! Novocain 5)  ! * Ivp Dye 6)  ! Doxycycline 7)  ! Cipro 8)  ! * Potassium 9)  ! * Dicoine 10)  ! * Levomithicine 11)  ! Nubain (Nalbuphine Hcl) 12)  ! * Belladonna 13)  ! * Gabapentin 14)  Baclofen (Baclofen) 15)  Carafate (Sucralfate) 16)  Macrobid (Nitrofurantoin Monohyd Macro)  Past History:  Past Medical History: Reviewed history from 06/02/2009 and no changes required. CAD .Marland Kitchencatheterization January, 2007.. patent grafts  /  hospitalization October, 2010.Marland Kitchen no MI CABG Syncope... March 28, 2009  ??  orthostasis ?? Carotid artery disease... Doppler... March 30, 2009... 0-39% LICA, 60-79% RICA .Marland Kitchen increased velocity.. possibly from serpentine vessel EF  50-55%...... no wall motion abnormalities.Marland Kitchenecho March 30, 2009. Allergic rhinitis Colonic polyps, hx of Diverticulosis, colon GERD Hyperlipidemia Abd. pain...Marland KitchenMarland KitchenMarland Kitchen motility disorder, chronic functional, severe    ( Dr Juanda Chance);  Possible OCD with compulsive use of enemas and laxatives Psychosomatic disorder with GI fixation Anxiety Depression Stress incontinence Multiple drug  allergies Vit D def Vit B12 def Renal insufficiency UTIs hypokalemia.... mild  Past Surgical History: Reviewed history from 01/30/2008 and no changes required. Coronary artery bypass graft Ovarian and Uterine Fibroid tumor excision Fibroadenoma Left breast- Bilateral breast surgeyr L hemicolectomy 2005  Family History: Reviewed history from 01/30/2008 and no changes required. Family History of CAD Female 1st degree relative <60 Family History of Colon Cancer: Mother  Social History: Reviewed history from 08/10/2008 and no changes required. Retired MD Married Never Smoked Alcohol use-no The patient is an immigrant from [place].  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  She does not smoke or use alcohol, and is  currently living with her husband.   Review of Systems       The patient complains of back pain.  The patient denies allergy/sinus, anemia, anxiety-new, arthritis/joint pain, blood in urine, breast changes/lumps, change in vision, confusion, cough, coughing up blood, depression-new, fainting, fatigue, fever, headaches-new, hearing problems, heart murmur, heart rhythm changes, itching, menstrual pain, muscle pains/cramps, night sweats, nosebleeds, pregnancy symptoms, shortness of breath, skin rash, sleeping problems, sore throat, swelling of feet/legs, swollen lymph glands, thirst - excessive , urination - excessive , urination changes/pain, urine leakage, vision changes, and voice change.         .ros  Vital Signs:  Patient profile:   77 year old female Height:      60 inches Weight:      146 pounds BMI:     28.62 BSA:     1.63 Pulse rate:   62 / minute Pulse rhythm:   regular BP sitting:   100 / 60  (left arm)  Vitals Entered By: Ok Anis CMA (August 02, 2009 8:22 AM)  Physical Exam  General:  depressed appearing. Alert and oriented. Very pleasant. Eyes:  Nonicteric. Neck:  Supple; no  masses or thyromegaly. Lungs:  clear to auscultation. Heart:  normal S1, normal S2, no murmur. Abdomen:  soft relaxed abdomen with decreased muscle tone. Normal active bowel sounds. No distention. Minimal tenderness in the left lower quadrant. Rectal:  Patient's rectal and anoscopic exam reveals anormal perianal area with normal rectal tone. No tenderness at the coccygeal area. Mucosa of the rectal ampulla appears normal. There is prominent papillae at the dentate line which constitutes a small firm nodule measuring about 5 mm. Pulses:  Normal pulses noted. Extremities:  No clubbing, cyanosis, edema or deformities noted. Skin:  Intact without significant lesions or rashes. Psych:  Alert and cooperative. Normal mood and affect.   Impression & Recommendations:  Problem # 1:  DYSPHAGIA UNSPECIFIED (ICD-787.20) We will schedule patient for an upper endoscopy and we will increase her omeprazole to 40 mg b.i.d. We will also give her a carafate slurry 10 cc p.o. b.i.d. I suspect she has gastritis ,also  we need to rule out a hiatal hernia, candida esophagitis or motility disorder.  Orders: EGD (EGD)  Problem # 2:  Family Hx of COLON CANCER (ICD-153.9) Patient is up-to-date  on her colonoscopy.  Problem # 3:  ABDOMINAL PAIN, RIGHT UPPER QUADRANT (ICD-789.01) We will obtain an upper abdominal ultrasound.  Orders: Ultrasound Abdomen (UAS)  Problem # 4:  CHRONIC PAIN SYNDROME (ICD-338.4) Patient has chronic rectal pain with no anatomical correlation. Patient will continue her laxative regimen.  Patient Instructions: 1)  Upper abdominal ultrasound. 2)  Upper endoscopy to be scheduled. 3)  Carafate slurry 10 cc p.o. b.i.d. 4)  Omeprazole 40 mg p.o. b.i.d. 5)  Copy sent to : Dr Posey Rea 6)  The medication list was reviewed and reconciled.  All changed / newly prescribed medications were explained.  A complete medication list was provided to the patient / caregiver. Prescriptions: CARAFATE 1  GM/10ML SUSP (SUCRALFATE) Take 2 teaspoons by mouth two times a day  #12 ounce x 0   Entered by:   Hortense Ramal CMA (AAMA)   Authorized by:   Hart Carwin MD   Signed by:   Hortense Ramal CMA (AAMA) on 08/02/2009   Method used:   [facility]        [facility] [address] [phone]* (retail)       [address]       [place], [place]  [address]       Ph: [phone] or [phone]       Fax: [phone]   RxID:   [id number]

## 2010-06-06 NOTE — Assessment & Plan Note (Signed)
Summary: 1-2 WK ROV /NWS   Vital Signs:  Patient profile:   77 year old female Height:      60 inches (152.40 cm) Weight:      153 pounds (69.55 kg) BMI:     29.99 O2 Sat:      93 % on Room air Temp:     98.3 degrees F (36.83 degrees C) oral Pulse rate:   61 / minute Resp:     16 per minute BP sitting:   98 / 60  (left arm) Cuff size:   regular  Vitals Entered By: Lanier Prude, CMA(AAMA) (November 08, 2009 4:33 PM)  O2 Flow:  Room air CC: f/u no improvement in abd pain   Primary Care Provider:  Jacinta Shoe, MD  CC:  f/u no improvement in abd pain.  History of Present Illness: F/u abd pain, tenesmus, compulsive enema use - not better Passed out once after taking NTG - BP was low C/o insomnia - can't sleep w/o Zolpidem; Ins cut her down to 15 pills/month  Current Medications (verified): 1)  Methadone Hcl 10 Mg  Tabs (Methadone Hcl) .... Take 1 Tablets By Mouth 4-  Times Per Day As Needed Please,  Fill On or After    10/19/09      . Ok To Fill 1-2 Day Earlier When The Month Has 31 Days in It. 2)  Lorazepam 1 Mg  Tabs (Lorazepam) .... As Needed 1 By Mouth Two Times A Day -- Qid Prn 3)  Nitroquick 0.4 Mg  Subl (Nitroglycerin) .... As Needed 4)  Metoprolol Succinate 25 Mg  Tb24 (Metoprolol Succinate) .Marland Kitchen.. 1 Tab Once Daily 5)  Trimethoprim 100 Mg  Tabs (Trimethoprim) .... Once Daily 6)  Aspir-Low 81 Mg Tbec (Aspirin) .Marland Kitchen.. 1 Once Daily Pc 7)  Zolpidem Tartrate 10 Mg  Tabs (Zolpidem Tartrate) .Marland Kitchen.. 1at Bedtime As Needed 8)  Premarin 0.625 Mg/gm Crea (Estrogens, Conjugated) .Marland Kitchen.. 1 G Pv Twice A Week 9)  Triamcinolone Acetonide 0.1 % Oint (Triamcinolone Acetonide) .... Use 1-2 Times A Day 10)  Urinary Tract Health 365-50 Mg Caps (Cranberry-Olive Leaf) .... Take One Tablet By Mouth Once Daily. 11)  Cobal-1000 1000 Mcg/ml Soln (Cyanocobalamin) .Marland Kitchen.. 1ml Subcutaneously Once Per 2 Weeks 12)  Vitamin D (Ergocalciferol) 50000 Unit Caps (Ergocalciferol) .Marland Kitchen.. 1 By Mouth Q 1 Month 13)   Promethazine Hcl 25 Mg Tabs (Promethazine Hcl) .Marland Kitchen.. 1-2 By Mouth Four Times A Day As Needed Nausea 14)  Omeprazole 40 Mg Cpdr (Omeprazole) .Marland Kitchen.. 1 By Mouth Qam For Indigestion 15)  Klor-Con M10 10 Meq Cr-Tabs (Potassium Chloride Crys Cr) .Marland Kitchen.. 1 By Mouth Bid 16)  Carafate 1 Gm/72ml Susp (Sucralfate) .... Take 2 Teaspoons By Mouth Two Times A Day 17)  Amitiza 24 Mcg Caps (Lubiprostone) .Marland Kitchen.. 1-2  By Mouth Once Daily As Needed Constipation 18)  Reglan 5 Mg  Tabs (Metoclopramide Hcl) .... Take 1 Tab Ac Three Times A Day. 19)  Omeprazole 20 Mg  Cpdr (Omeprazole) .... Take 1 Twice Daily For 10 Days. 20)  Diphenhydramine Hcl 25 Mg Caps (Diphenhydramine Hcl) .Marland Kitchen.. 1-2 By Mouth Qid As Needed Allergy 21)  Lactulose 20 Gm/44ml Soln (Lactulose) .... Use 30-60 Cc Prn 22)  Divalproex Sodium 125 Mg Tbec (Divalproex Sodium) .Marland Kitchen.. 1 By Mouth Bid 23)  Gelnique 10 % Gel (Oxybutynin Chloride) .... Use On Skin Once Daily For Bladder Issues  Allergies (verified): 1)  ! Penicillin 2)  ! Sulfa 3)  ! Erythromycin 4)  ! Novocain 5)  ! *  Ivp Dye 6)  ! Doxycycline 7)  ! Cipro 8)  ! * Potassium 9)  ! * Dicoine 10)  ! * Levomithicine 11)  ! Nubain (Nalbuphine Hcl) 12)  ! * Belladonna 13)  ! * Gabapentin 14)  ! 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  She does not smoke or use alcohol, and is  currently living with her husband.   Past Medical History: CAD .Marland Kitchencatheterization  January, 2007.. patent grafts  /  hospitalization October, 2010.Marland Kitchen no MI CABG Syncope... March 28, 2009  ??orthostasis ?? Carotid artery disease... Doppler... March 30, 2009... 0-39% LICA, 60-79% RICA .Marland Kitchen increased velocity.. possibly from serpentine vessel EF  50-55%...... no wall motion abnormalities.Marland Kitchenecho March 30, 2009. Allergic rhinitis Colonic polyps, hx of Diverticulosis, colon GERD Hyperlipidemia Abd. pain...Marland KitchenMarland KitchenMarland Kitchen motility disorder, chronic functional, severe    ( Dr Juanda Chance);  Possible OCD with compulsive use of enemas and laxatives; psyhotic feaures 2011 H pylori (+) 2011 Psychosomatic disorder with GI fixation Anxiety Depression Stress incontinence Multiple drug allergies Vit D def Vit B12 def Renal insufficiency UTIs hypokalemia.... mild Insomnia Hyponatremia 2011  Review of Systems       The patient complains of anorexia, syncope, abdominal pain, and depression.  The patient denies fever.    Physical Exam  General:  appears depressed; communicates in [name]  Chronically ill-appearing - not worse than before  Eyes:  watery bags under eyes Mouth:  No deformity or lesions, dentition normal. Neck:  Supple; no masses or thyromegaly. Lungs:  Clear throughout to auscultation. Heart:  Regular rate and rhythm; no murmurs, rubs,  or bruits. Abdomen:  soft abdomen with minimal tenderness in left lower quadrant and left middle quadrant. No distention. Liver edge at costal margin. Msk:  Lumbar-sacral spine is tender to palpation over paraspinal muscles and painfull with the ROM  Extremities:  trace left pedal edema and trace right pedal edema.   Neurologic:  Ataxic Skin:  Intact without suspicious lesions or rashes Cervical Nodes:  No lymphadenopathy noted Psych:  anxious appearing, not agitated, not suicidal, depressed affect, flat affect, subdued and tearful.     Impression & Recommendations:  Problem # 1:  HYPONATREMIA (ICD-276.1) Assessment Improved The  labs were reviewed with the patient.   Problem # 2:  PSYCHOSIS (ICD-298.9) Assessment: Unchanged Increase Depakote to two times a day (was taking once daily)  Problem # 3:  OBSESSIVE-COMPULSIVE DISORDER (ICD-300.3) Assessment: Unchanged as above  Problem # 4:  SYNCOPE (ICD-780.2) hypotensive due to NTG Assessment: New  Problem # 5:  INSOMNIA, CHRONIC (ICD-307.42) Assessment: Unchanged see meds  Complete Medication List: 1)  Methadone Hcl 10 Mg Tabs (Methadone hcl) .... Take 1 tablets by mouth 4-  times per day as needed please,  fill on or after    10/19/09      . ok to fill 1-2 day earlier when the month has 31 days in it. 2)  Lorazepam 1 Mg Tabs (Lorazepam) .... As needed 1 by mouth two times a day -- qid prn 3)  Nitroquick  0.4 Mg Subl (Nitroglycerin) .... As needed 4)  Metoprolol Succinate 25 Mg Tb24 (Metoprolol succinate) .Marland Kitchen.. 1 tab once daily 5)  Trimethoprim 100 Mg Tabs (Trimethoprim) .... Once daily 6)  Aspir-low 81 Mg Tbec (Aspirin) .Marland Kitchen.. 1 once daily pc 7)  Zolpidem Tartrate 10 Mg Tabs (Zolpidem tartrate) .Marland Kitchen.. 1at bedtime for insomnia medically necessary to have 30 tabs every 30 days 8)  Premarin 0.625 Mg/gm Crea (Estrogens, conjugated) .Marland Kitchen.. 1 g pv twice a week 9)  Triamcinolone Acetonide 0.1 % Oint (Triamcinolone acetonide) .... Use 1-2 times a day 10)  Urinary Tract Health 365-50 Mg Caps (Cranberry-olive leaf) .... Take one tablet by mouth once daily. 11)  Cobal-1000 1000 Mcg/ml Soln (Cyanocobalamin) .Marland Kitchen.. 1ml subcutaneously once per 2 weeks 12)  Vitamin D (ergocalciferol) 50000 Unit Caps (Ergocalciferol) .Marland Kitchen.. 1 by mouth q 1 month 13)  Promethazine Hcl 25 Mg Tabs (Promethazine hcl) .Marland Kitchen.. 1-2 by mouth four times a day as needed nausea 14)  Omeprazole 40 Mg Cpdr (Omeprazole) .Marland Kitchen.. 1 by mouth qam for indigestion 15)  Klor-con M10 10 Meq Cr-tabs (Potassium chloride crys cr) .Marland Kitchen.. 1 by mouth bid 16)  Carafate 1 Gm/35ml Susp (Sucralfate) .... Take 2 teaspoons by mouth two times a  day 17)  Amitiza 24 Mcg Caps (Lubiprostone) .Marland Kitchen.. 1-2  by mouth once daily as needed constipation 18)  Reglan 5 Mg Tabs (Metoclopramide hcl) .... Take 1 tab ac three times a day. 19)  Omeprazole 20 Mg Cpdr (Omeprazole) .... Take 1 twice daily for 10 days. 20)  Diphenhydramine Hcl 25 Mg Caps (Diphenhydramine hcl) .Marland Kitchen.. 1-2 by mouth qid as needed allergy 21)  Lactulose 20 Gm/38ml Soln (Lactulose) .... Use 30-60 cc prn 22)  Divalproex Sodium 125 Mg Tbec (Divalproex sodium) .Marland Kitchen.. 1 by mouth bid 23)  Gelnique 10 % Gel (Oxybutynin chloride) .... Use on skin once daily for bladder issues  Patient Instructions: 1)  Please schedule a follow-up appointment in 1 month. 2)  BMP prior to visit, ICD-9: 3)  CBC w/ Diff prior to visit, ICD-9:995.20 4)  depakote level Prescriptions: METOPROLOL SUCCINATE 25 MG  TB24 (METOPROLOL SUCCINATE) 1 tab once daily  #30 x 12   Entered and Authorized by:   Tresa Garter MD   Signed by:   Tresa Garter MD on 11/08/2009   Method used:   Print then Give to Patient   RxID:   [id number] ZOLPIDEM TARTRATE 10 MG  TABS (ZOLPIDEM TARTRATE) 1at bedtime for insomnia Medically necessary to have 30 tabs every 30 days  #30 x 6   Entered and Authorized by:   Tresa Garter MD   Signed by:   Tresa Garter MD on 11/08/2009   Method used:   Print then Give to Patient   RxID:   [id number]

## 2010-06-06 NOTE — Assessment & Plan Note (Signed)
Summary: add on for cp/sobx2wks per [name]/saf      Allergies Added:   Visit Type:  add-on Primary Provider:  Jacinta Shoe, MD  CC:  chest pain.  History of Present Illness: The patient is seen for evaluation of chest discomfort.  She does have coronary disease.  Her last catheterization was in 2007 and her grafts were patent.  She was hospitalized October, 2010 with chest pain but no MI.  She has normal left ventricular function by echo.  Recently she notices sharp fleeting discomfort in her left anterior chest.  This can occur with rest or exercise.  It is very brief.  Current Medications (verified): 1)  Methadone Hcl 10 Mg  Tabs (Methadone Hcl) .... Take 1 Tablets By Mouth 4-  Times Per Day As Needed Please,  Fill On or After    04/20/10    . Ok To Fill 1-2 Day Earlier When The Month Has 31 Days in It. 2)  Lorazepam 1 Mg  Tabs (Lorazepam) .... As Needed 1 By Mouth Two Times A Day -- Qid Prn 3)  Nitroquick 0.4 Mg  Subl (Nitroglycerin) .... As Needed 4)  Metoprolol Succinate 25 Mg  Tb24 (Metoprolol Succinate) .Marland Kitchen.. 1 Tab Once Daily 5)  Trimethoprim 100 Mg  Tabs (Trimethoprim) .... Once Daily 6)  Aspir-Low 81 Mg Tbec (Aspirin) .Marland Kitchen.. 1 Once Daily Pc 7)  Zolpidem Tartrate 10 Mg  Tabs (Zolpidem Tartrate) .Marland Kitchen.. 1at Bedtime For Insomnia Medically Necessary To Have 30 Tabs Every 30 Days 8)  Premarin 0.625 Mg/gm Crea (Estrogens, Conjugated) .Marland Kitchen.. 1 G Pv Twice A Week 9)  Triamcinolone Acetonide 0.1 % Oint (Triamcinolone Acetonide) .... Use 1-2 Times A Day 10)  Vitamin D (Ergocalciferol) 50000 Unit Caps (Ergocalciferol) .Marland Kitchen.. 1 By Mouth Q 1 Month 11)  Promethazine Hcl 25 Mg Tabs (Promethazine Hcl) .Marland Kitchen.. 1-2 By Mouth Four Times A Day As Needed Nausea 12)  Omeprazole 40 Mg Cpdr (Omeprazole) .Marland Kitchen.. 1 By Mouth Qam For Indigestion 13)  Klor-Con M10 10 Meq Cr-Tabs (Potassium Chloride Crys Cr) .Marland Kitchen.. 1 By Mouth Bid 14)  Amitiza 24 Mcg Caps (Lubiprostone) .Marland Kitchen.. 1-2  By Mouth Once Daily As Needed  Constipation 15)  Reglan 5 Mg  Tabs (Metoclopramide Hcl) .... Take 1 Tab Ac Three Times A Day. 16)  Omeprazole 20 Mg  Cpdr (Omeprazole) .... Take 1 Twice Daily For 10 Days. 17)  Diphenhydramine Hcl 25 Mg Caps (Diphenhydramine Hcl) .Marland Kitchen.. 1-2 By Mouth Qid As Needed Allergy 18)  Lactulose 20 Gm/65ml Soln (Lactulose) .... Use 30-60 Cc Prn 19)  Gelnique 10 % Gel (Oxybutynin Chloride) .... Use On Skin Once Daily For Bladder Issues 20)  Divalproex Sodium 250 Mg Tbec (Divalproex Sodium) .Marland Kitchen.. 1 By Mouth Bid  Allergies (verified): 1)  ! Penicillin 2)  ! Sulfa 3)  ! Erythromycin 4)  ! Novocain 5)  ! * Ivp Dye 6)  ! Doxycycline 7)  ! Cipro 8)  ! * Potassium 9)  ! * Dicoine 10)  ! * Levomithicine 11)  ! Nubain (Nalbuphine Hcl) 12)  ! * Belladonna 13)  ! * Gabapentin 14)  ! Metronidazole (Metronidazole) 15)  Baclofen (Baclofen) 16)  Carafate (Sucralfate) 17)  Macrobid (Nitrofurantoin Monohyd Macro)  Past History:  Past Medical History: CAD .Marland Kitchencatheterization January, 2007.. patent grafts  /  hospitalization October, 2010.Marland Kitchen no MI CABG Syncope... March 28, 2009  ??orthostasis ?? Carotid artery disease... Doppler... March 30, 2009... 0-39% LICA, 60-79% RICA .Marland Kitchen increased velocity.. possibly from serpentine vessel EF  50-55%...... no  wall motion abnormalities.Marland Kitchenecho March 30, 2009. Allergic rhinitis Colonic polyps, hx of Diverticulosis, colon.Marland Kitchen GERD Hyperlipidemia Abd. pain...Marland KitchenMarland KitchenMarland Kitchen motility disorder, chronic functional, severe    ( Dr Juanda Chance);  Possible OCD with compulsive use of enemas and laxatives; psyhotic feaures 2011 H pylori (+) 2011 Psychosomatic disorder with GI fixation Anxiety Depression Stress incontinence Multiple drug allergies Vit D def Vit B12 def Renal insufficiency UTIs hypokalemia.... mild Insomnia Hyponatremia 2011  Review of Systems       Patient denies fever, chills, headache, sweats, rash, change in vision, change in hearing, cough, nausea vomiting,  urinary symptoms.  All other systems are reviewed and are negative.  Vital Signs:  Patient profile:   77 year old female Height:      60 inches Weight:      158 pounds BMI:     30.97 Pulse rate:   67 / minute BP sitting:   144 / 86  (left arm) Cuff size:   regular  Vitals Entered By: Hardin Negus, RMA (March 16, 2010 9:59 AM)  Physical Exam  General:  patient is stable today. Eyes:  no xanthelasma. Neck:  no jugular distention. Chest Wall:  no chest wall tenderness. Lungs:  lungs are clear.  Respiratory effort is nonlabored. Heart:  cardiac exam reveals an S1-S2.  No clicks or significant murmurs Abdomen:  abdomen is soft. Extremities:  no peripheral edema. Psych:  patient is oriented to person time and place.  She communicates in [name].  The interpreter is here today along with her husband.   Impression & Recommendations:  Problem # 1:  OBSESSIVE-COMPULSIVE DISORDER (ICD-300.3) This plays a role with her overall symptomatology.  Problem # 2:  CAROTID ARTERY DISEASE (ICD-433.10)  Her updated medication list for this problem includes:    Aspir-low 81 Mg Tbec (Aspirin) .Marland Kitchen... 1 once daily pc There is significant carotid disease.  She is scheduled to have a followup carotid Doppler.  She asked if this should be done and I made it clear that it's very important for her.  She does have significant disease.  Problem # 3:  CHEST PAIN, SUBSTERNAL (ICD-786.59)  Her updated medication list for this problem includes:    Nitroquick 0.4 Mg Subl (Nitroglycerin) .Marland Kitchen... As needed    Metoprolol Succinate 25 Mg Tb24 (Metoprolol succinate) .Marland Kitchen... 1 tab once daily    Aspir-low 81 Mg Tbec (Aspirin) .Marland Kitchen... 1 once daily pc I believe today's chest pain is noncardiac.  EKG is done today and reviewed by me.  She has old inferior MI and no change.  No further cardiac workup is needed.  I'll see her back in 6 months for cardiology followup.  Other Orders: EKG w/ Interpretation  (93000)  Patient Instructions: 1)  Your physician recommends that you schedule a follow-up appointment in: 6 months. 2)  Your physician recommends that you continue on your current medications as directed. Please refer to the Current Medication list given to you today.

## 2010-06-06 NOTE — Medication Information (Signed)
Summary: Prior Auth/[facility]  Prior Auth/[facility]   Imported By: Lester Wabasso 11/11/2009 09:24:06  _____________________________________________________________________  External Attachment:    Type:   Image     Comment:   External Document

## 2010-06-06 NOTE — Progress Notes (Signed)
Summary: Omeprazole PA  Phone Note From Pharmacy   Caller: RX Ruben Gottron   [phone] Call For: ID:  [id number]  Summary of Call: I rec'd a form stating that patient Omeprazole is covered on patient formulary, but the patient was given a limited supply due to qty limit of patient plan. Pt only takes 1/day. Ok to do qty limit override/PA? Initial call taken by: Lucious Groves,  June 07, 2009 11:55 AM  Follow-up for Phone Call         [facility] Thx Follow-up by: Tresa Garter MD,  June 07, 2009 12:01 PM  Additional Follow-up for Phone Call Additional follow up Details #1::        they will fax form. Additional Follow-up by: Lucious Groves,  June 07, 2009 2:31 PM    Additional Follow-up for Phone Call Additional follow up Details #2::    awaiting MD completion.Lucious Groves,  June 07, 2009 3:04 PM  Completed and faxed, will wait for insurance company reply. Lucious Groves  June 07, 2009 4:52 PM   Additional Follow-up for Phone Call Additional follow up Details #3:: Details for Additional Follow-up Action Taken: Approved until 06-10-2010. *Sent copy to scanning. Additional Follow-up by: Lucious Groves,  June 13, 2009 8:32 AM

## 2010-06-06 NOTE — Medication Information (Signed)
Summary: Zolpidem DENIED/[facility]  Zolpidem DENIED/[facility]   Imported By: Sherian Rein 11/11/2009 14:25:08  _____________________________________________________________________  External Attachment:    Type:   Image     Comment:   External Document

## 2010-06-06 NOTE — Assessment & Plan Note (Signed)
Summary: 2 MTH FU  STC  RS'D PER DAUGHTER EARLIER APPT /NWS   Vital Signs:  Patient profile:   77 year old female Weight:      148 pounds Temp:     98.5 degrees F oral Pulse rate:   72 / minute BP sitting:   102 / 64  (left arm)  Vitals Entered By: Tora Perches (June 10, 2009 10:01 AM) CC: f/u Is Patient Diabetic? No   Primary Care Provider:  Sula Soda, MD  CC:  f/u.  History of Present Illness: The patient presents for a follow up of abd pain, anxiety, depression and headaches. She went to UC for a UTI and was given an Abx but did not take it - was afraid; now is using herbs   Preventive Screening-Counseling & Management  Alcohol-Tobacco     Smoking Status: never  Current Medications (verified): 1)  Methadone Hcl 10 Mg  Tabs (Methadone Hcl) .... Take 1 Tablets By Mouth 3  Times Per Day As Needed Please,  Fill On or After    06/07/09      . Ok To Fill 1-2 Day Earlier When The Month Has 31 Days in It. 2)  Lorazepam 1 Mg  Tabs (Lorazepam) .... As Needed 1 By Mouth Bid 3)  Nitroquick 0.4 Mg  Subl (Nitroglycerin) .... As Needed 4)  Metoprolol Succinate 25 Mg  Tb24 (Metoprolol Succinate) .... 1/2 Tab Once Daily 5)  Trimethoprim 100 Mg  Tabs (Trimethoprim) .... Once Daily 6)  Omeprazole 20 Mg  Cpdr (Omeprazole) .... Once Daily 7)  Aspir-Low 81 Mg Tbec (Aspirin) .Marland Kitchen.. 1 Once Daily Pc 8)  Zolpidem Tartrate 10 Mg  Tabs (Zolpidem Tartrate) .Marland Kitchen.. 1at Bedtime As Needed 9)  Premarin 0.625 Mg/gm Crea (Estrogens, Conjugated) .Marland Kitchen.. 1 G Pv Twice A Week 10)  Triamcinolone Acetonide 0.1 % Oint (Triamcinolone Acetonide) .... Use 1-2 Times A Day 11)  Urinary Tract Health 365-50 Mg Caps (Cranberry-Olive Leaf) .... Take One Tablet By Mouth Once Daily. 12)  Cobal-1000 1000 Mcg/ml Soln (Cyanocobalamin) .Marland Kitchen.. 1ml Subcutaneously Once Per 2 Weeks 13)  Vitamin D (Ergocalciferol) 50000 Unit Caps (Ergocalciferol) .Marland Kitchen.. 1 By Mouth Q 1 Month 14)  Promethazine Hcl 25 Mg Tabs (Promethazine Hcl) .Marland Kitchen.. 1-2  By Mouth Four Times A Day As Needed Nausea 15)  Potassium Chloride Cr 10 Meq Cr-Caps (Potassium Chloride) .... Take One Tablet By Mouth Daily  Allergies: 1)  ! Penicillin 2)  ! Sulfa 3)  ! Erythromycin 4)  ! Novocain 5)  ! * Ivp Dye 6)  ! Doxycycline 7)  ! Cipro 8)  ! * Potassium 9)  ! * Dicoine 10)  ! * Levomithicine 11)  ! Nubain (Nalbuphine Hcl) 12)  ! * Belladonna 13)  ! * Gabapentin 14)  Baclofen (Baclofen) 15)  Carafate (Sucralfate) 16)  Macrobid (Nitrofurantoin Monohyd Macro)  Past History:  Past Medical History: Last updated: 06/02/2009 CAD .Marland Kitchencatheterization January, 2007.. patent grafts  /  hospitalization October, 2010.Marland Kitchen no MI CABG Syncope... March 28, 2009  ??orthostasis ?? Carotid artery disease... Doppler... March 30, 2009... 0-39% LICA, 60-79% RICA .Marland Kitchen increased velocity.. possibly from serpentine vessel EF  50-55%...... no wall motion abnormalities.Marland Kitchenecho March 30, 2009. Allergic rhinitis Colonic polyps, hx of Diverticulosis, colon GERD Hyperlipidemia Abd. pain...Marland KitchenMarland KitchenMarland Kitchen motility disorder, chronic functional, severe    ( Dr Juanda Chance);  Possible OCD with compulsive use of enemas and laxatives Psychosomatic disorder with GI fixation Anxiety Depression Stress incontinence Multiple drug allergies Vit D def Vit B12 def  Renal insufficiency UTIs hypokalemia.... mild  Social History: Last updated: 08/10/2008 Retired MD Married Never Smoked Alcohol use-no The patient is an immigrant from [place].  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  She does not smoke or use alcohol, and is   currently living with her husband.   Review of Systems       The patient complains of abdominal pain and severe indigestion/heartburn.  The patient denies fever, chest pain, melena, and hematochezia.    Physical Exam  General:  appears depressed; communicates in [name]  Chronically  ill-appearing  Nose:  External nasal examination shows no deformity or inflammation. Nasal mucosa are pink and moist without lesions or exudates. Mouth:  No deformity or lesions, dentition normal. Neck:  Supple; no masses or thyromegaly. Lungs:  Clear throughout to auscultation. Heart:  Regular rate and rhythm; no murmurs, rubs,  or bruits. Abdomen:  soft abdomen with minimal tenderness in left lower quadrant and left middle quadrant. No distention. Liver edge at costal margin. Msk:  Lumbar-sacral spine is tender to palpation over paraspinal muscles and painfull with the ROM  Neurologic:  No cranial nerve deficits noted. Station and gait are normal. Plantar reflexes are down-going bilaterally. DTRs are symmetrical throughout. Sensory, motor and coordinative functions appear intact. Skin:  Intact without suspicious lesions or rashes Psych:  not anxious appearing, not agitated, not suicidal, depressed affect, flat affect, subdued, and withdrawn.     Impression & Recommendations:  Problem # 1:  HYPOKALEMIA (ICD-276.8) Assessment New Risks of noncompliance with treatment discussed. Compliance encouraged. The labs were reviewed with the patient.   Problem # 2:  CYSTITIS (ICD-595.9) Assessment: Deteriorated  Her updated medication list for this problem includes:    Trimethoprim 100 Mg Tabs (Trimethoprim) ..... Once daily    Ceftin 250 Mg Tabs (Cefuroxime axetil) .Marland Kitchen... 1 by mouth two times a day for bladder infection  Problem # 3:  DEPRESSION (ICD-311)  The following medications were removed from the medication list:    Hydroxyzine Hcl 25 Mg Tabs (Hydroxyzine hcl) .Marland Kitchen... 1 by mouth two times a day as needed anxiety Her updated medication list for this problem includes:    Lorazepam 1 Mg Tabs (Lorazepam) .Marland Kitchen... As needed 1 by mouth bid  Problem # 4:  ABDOMINAL PAIN, RIGHT UPPER QUADRANT (ICD-789.01) Assessment: Deteriorated On prescription therapy - we increased Methadone to up to qid:  they understand - risk of confusion etc is higher. Risks vs benefits and controversies of a long term controlled substances use were discussed.  Problem # 5:  GERD (ICD-530.81) Assessment: Comment Only  The following medications were removed from the medication list:    Omeprazole 20 Mg Cpdr (Omeprazole) ..... Once daily Her updated medication list for this problem includes:    Protonix 40 Mg Tbec (Pantoprazole sodium) .Marland Kitchen... 1 by mouth qam for indigestion  Problem # 6:  SYMPTOM, MEMORY LOSS (ICD-780.93) Assessment: Unchanged  Problem # 7:  B12 DEFICIENCY (ICD-266.2) Assessment: Comment Only Risks of noncompliance with treatment discussed. Compliance encouraged.  Complete Medication List: 1)  Methadone Hcl 10 Mg Tabs (Methadone hcl) .... Take 1 tablets by mouth 3-4  times per day as needed please,  fill on or after    08/05/09      . ok to fill 1-2 day earlier when the month has 31 days in it. 2)  Lorazepam 1 Mg Tabs (Lorazepam) .... As needed 1 by mouth  bid 3)  Nitroquick 0.4 Mg Subl (Nitroglycerin) .... As needed 4)  Metoprolol Succinate 25 Mg Tb24 (Metoprolol succinate) .... 1/2 tab once daily 5)  Trimethoprim 100 Mg Tabs (Trimethoprim) .... Once daily 6)  Aspir-low 81 Mg Tbec (Aspirin) .Marland Kitchen.. 1 once daily pc 7)  Zolpidem Tartrate 10 Mg Tabs (Zolpidem tartrate) .Marland Kitchen.. 1at bedtime as needed 8)  Premarin 0.625 Mg/gm Crea (Estrogens, conjugated) .Marland Kitchen.. 1 g pv twice a week 9)  Triamcinolone Acetonide 0.1 % Oint (Triamcinolone acetonide) .... Use 1-2 times a day 10)  Urinary Tract Health 365-50 Mg Caps (Cranberry-olive leaf) .... Take one tablet by mouth once daily. 11)  Cobal-1000 1000 Mcg/ml Soln (Cyanocobalamin) .Marland Kitchen.. 1ml subcutaneously once per 2 weeks 12)  Vitamin D (ergocalciferol) 50000 Unit Caps (Ergocalciferol) .Marland Kitchen.. 1 by mouth q 1 month 13)  Promethazine Hcl 25 Mg Tabs (Promethazine hcl) .Marland Kitchen.. 1-2 by mouth four times a day as needed nausea 14)  Potassium Chloride Cr 10 Meq Cr-caps  (Potassium chloride) .... Take one tablet by mouth daily 15)  Ceftin 250 Mg Tabs (Cefuroxime axetil) .Marland Kitchen.. 1 by mouth two times a day for bladder infection 16)  Protonix 40 Mg Tbec (Pantoprazole sodium) .Marland Kitchen.. 1 by mouth qam for indigestion  Patient Instructions: 1)  Please schedule a follow-up appointment in 2 months. 2)  BMP prior to visit, ICD-9: 3)  Vit B12 266.20 Prescriptions: PROTONIX 40 MG TBEC (PANTOPRAZOLE SODIUM) 1 by mouth qam for indigestion  #90 x 3   Entered and Authorized by:   Tresa Garter MD   Signed by:   Tresa Garter MD on 06/10/2009   Method used:   Print then Give to Patient   RxID:   [id number] METHADONE HCL 10 MG  TABS (METHADONE HCL) Take 1 tablets by mouth 3-4  times per day as needed Please,  fill on or after    08/05/09      . OK to fill 1-2 day earlier when the month has 31 days in it.  #120 x 0   Entered and Authorized by:   Tresa Garter MD   Signed by:   Tresa Garter MD on 06/10/2009   Method used:   Print then Give to Patient   RxID:   [id number] METHADONE HCL 10 MG  TABS (METHADONE HCL) Take 1 tablets by mouth 3-4  times per day as needed Please,  fill on or after    07/05/09      . OK to fill 1-2 day earlier when the month has 31 days in it.  #120 x 0   Entered and Authorized by:   Tresa Garter MD   Signed by:   Tresa Garter MD on 06/10/2009   Method used:   Print then Give to Patient   RxID:   [id number] CEFTIN 250 MG TABS (CEFUROXIME AXETIL) 1 by mouth two times a day for bladder infection  #10 x 1   Entered and Authorized by:   Tresa Garter MD   Signed by:   Tresa Garter MD on 06/10/2009   Method used:   Print then Give to Patient   RxID:   [id number]

## 2010-06-06 NOTE — Letter (Signed)
Summary: Patient [name] Biopsy Results  [facility]  [address] [place], [place] [address]   Phone: [phone]  Fax: [phone]        August 27, 2009 MRN: [id number]    [name] [address] [place], [place]  [address]    Dear [name],  I am pleased to inform you that the biopsies taken during your recent endoscopic examination did not show any evidence of cancer upon pathologic examination.THe biopsy from the stomach is positive for Helicobacter Pylori. infectionx  Additional information/recommendations:  __No further action is needed at this time.  Please follow-up with      your primary care physician for your other healthcare needs.  __ Please call [phone] to speak with [name] concerning treatment for H.Pylori. You will need to take an antibiotic.       __ Continue with the treatment plan as outlined on the day of your      exam.     Please call us if you are having persistent problems or have questions about your condition that have not been fully answered at this time.  Sincerely,  Hart Carwin MD  This letter has been electronically signed by your physician.  Appended Document: Patient Notice-Endo Biopsy Results Reviewed w/pt's husband by phone and mailed to pt. with a phamplet on H-Pylori. Pt. instructed to call back as needed.

## 2010-06-06 NOTE — Medication Information (Signed)
Summary: Prior [facility] Approved for Omeprazole/Rx [place]  Prior [employer] Approved for Omeprazole/Rx [place]   Imported By: Sherian Rein 06/14/2009 10:30:02  _____________________________________________________________________  External Attachment:    Type:   Image     Comment:   External Document

## 2010-06-06 NOTE — Miscellaneous (Signed)
Summary: reglan rx.  Clinical Lists Changes  Medications: Added new medication of REGLAN 5 MG  TABS (METOCLOPRAMIDE HCL) take 1 tab ac three times a day. - Signed Rx of REGLAN 5 MG  TABS (METOCLOPRAMIDE HCL) take 1 tab ac three times a day.;  #90 x 2;  Signed;  Entered by: Darlyn Read RN;  Authorized by: Hart Carwin MD;  Method used: [facility] [address] [phone]*, [address], [place], [place], [place]  [address], Ph: [phone] or [phone], Fax: [phone]    Prescriptions: REGLAN 5 MG  TABS (METOCLOPRAMIDE HCL) take 1 tab ac three times a day.  #90 x 2   Entered by:   Darlyn Read RN   Authorized by:   Hart Carwin MD   Signed by:   Darlyn Read RN on 08/23/2009   Method used:   [facility] to        [facility] [address] [phone]* (retail)       [address]       [place], [place]  [address]       Ph: [phone] or [phone]       Fax: [phone]   RxID:   [id number]

## 2010-06-06 NOTE — Letter (Signed)
Summary: Appointment - Reminder 2  [facility], Main Office  [address]   [place], [place] [address]   Phone: [phone]  Fax: [phone]     Sep 21, 2009 MRN: [id number]   [name] [address] [place], [place]  [address]   Dear Ms. Gorman,  Our records indicate that it is time to schedule a follow-up appointment with Dr. [name]. It is very important that we reach you to schedule this appointment. We look forward to participating in your health care needs. Please contact us at the number listed above at your earliest convenience to schedule your appointment.  If you are unable to make an appointment at this time, give [place] a call so we can update our records.     Sincerely,    Migdalia Dk [facility] Scheduling Team

## 2010-06-08 NOTE — Letter (Signed)
Summary: [facility] - Medical Clearance   [facility] - Medical Clearance   Imported By: Marylou Mccoy 05/10/2010 18:05:30  _____________________________________________________________________  External Attachment:    Type:   Image     Comment:   External Document

## 2010-06-08 NOTE — Letter (Addendum)
Summary: [facility]  [facility]   Imported By: Lennie Odor 05/29/2010 12:23:57  _____________________________________________________________________  External Attachment:    Type:   Image     Comment:   External Document

## 2010-06-08 NOTE — Letter (Signed)
Summary: Medical Clearance/[facility]  Medical Clearance/[facility]   Imported By: Sherian Rein 04/25/2010 07:46:12  _____________________________________________________________________  External Attachment:    Type:   Image     Comment:   External Document

## 2010-06-08 NOTE — Assessment & Plan Note (Signed)
Summary: 2 MTH FU  STC   Vital Signs:  Patient profile:   77 year old female Height:      60 inches Weight:      154 pounds BMI:     30.18 Temp:     97.6 degrees F oral Pulse rate:   84 / minute Pulse rhythm:   regular Resp:     16 per minute BP sitting:   140 / 74  (left arm) Cuff size:   regular  Vitals Entered By: Lanier Prude, CMA(AAMA) (April 17, 2010 9:08 AM) CC: 2 mo f/u  Is Patient Diabetic? No   Primary Care Provider:  Jacinta Shoe, MD  CC:  2 mo f/u .  History of Present Illness: The patient presents for a follow up of abd pain with OCD , CAD, anxiety. Methadone 4 a day is not enough - needs 5 a day. They are very carefull about the Methadone schedule... Breast biopsy was positive for cancer.  Current Medications (verified): 1)  Methadone Hcl 10 Mg  Tabs (Methadone Hcl) .... Take 1 Tablets By Mouth Up To 4  Times Per Day As Needed Please,  Fill On or After    05/21/10   . Ok To Fill 1-2 Day Earlier When The Month Has 31 Days in It. 2)  Lorazepam 1 Mg  Tabs (Lorazepam) .... As Needed 1 By Mouth Two Times A Day -- Qid Prn 3)  Nitroquick 0.4 Mg  Subl (Nitroglycerin) .... As Needed 4)  Metoprolol Succinate 25 Mg  Tb24 (Metoprolol Succinate) .Marland Kitchen.. 1 Tab Once Daily 5)  Trimethoprim 100 Mg  Tabs (Trimethoprim) .... Once Daily 6)  Aspir-Low 81 Mg Tbec (Aspirin) .Marland Kitchen.. 1 Once Daily Pc 7)  Zolpidem Tartrate 10 Mg  Tabs (Zolpidem Tartrate) .Marland Kitchen.. 1at Bedtime For Insomnia Medically Necessary To Have 30 Tabs Every 30 Days 8)  Premarin 0.625 Mg/gm Crea (Estrogens, Conjugated) .Marland Kitchen.. 1 G Pv Twice A Week 9)  Triamcinolone Acetonide 0.1 % Oint (Triamcinolone Acetonide) .... Use 1-2 Times A Day 10)  Vitamin D (Ergocalciferol) 50000 Unit Caps (Ergocalciferol) .Marland Kitchen.. 1 By Mouth Q 1 Month 11)  Promethazine Hcl 25 Mg Tabs (Promethazine Hcl) .Marland Kitchen.. 1-2 By Mouth Four Times A Day As Needed Nausea 12)  Klor-Con M10 10 Meq Cr-Tabs (Potassium Chloride Crys Cr) .Marland Kitchen.. 1 By Mouth Bid 13)  Amitiza  24 Mcg Caps (Lubiprostone) .Marland Kitchen.. 1-2  By Mouth Once Daily As Needed Constipation 14)  Reglan 5 Mg  Tabs (Metoclopramide Hcl) .... Take 1 Tab Ac Three Times A Day. 15)  Omeprazole 20 Mg  Cpdr (Omeprazole) .... Take 1 Twice Daily For 10 Days. 16)  Diphenhydramine Hcl 25 Mg Caps (Diphenhydramine Hcl) .Marland Kitchen.. 1-2 By Mouth Qid As Needed Allergy 17)  Lactulose 20 Gm/65ml Soln (Lactulose) .... Use 30-60 Cc Prn 18)  Gelnique 10 % Gel (Oxybutynin Chloride) .... Use On Skin Once Daily For Bladder Issues 19)  Divalproex Sodium 250 Mg Tbec (Divalproex Sodium) .Marland Kitchen.. 1 By Mouth Bid  Allergies (verified): 1)  ! Penicillin 2)  ! Sulfa 3)  ! Erythromycin 4)  ! Novocain 5)  ! * Ivp Dye 6)  ! Doxycycline 7)  ! Cipro 8)  ! * Potassium 9)  ! * Dicoine 10)  ! * Levomithicine 11)  ! Nubain (Nalbuphine Hcl) 12)  ! * Belladonna 13)  ! * Gabapentin 14)  ! 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  She does not smoke or use alcohol, and is  currently living with her husband.   Past Medical History: CAD .Marland Kitchencatheterization January, 2007.. patent grafts  /  hospitalization October, 2010.Marland Kitchen no MI CABG Syncope... March 28, 2009  ??orthostasis ?? Carotid artery disease... Doppler... March 30, 2009... 0-39% LICA, 60-79% RICA .Marland Kitchen increased velocity.. possibly from serpentine vessel EF  50-55%...... no wall motion abnormalities.Marland Kitchenecho March 30, 2009. Allergic rhinitis Colonic  polyps, hx of Diverticulosis, colon.Marland Kitchen GERD Hyperlipidemia Abd. pain...Marland KitchenMarland KitchenMarland Kitchen motility disorder, chronic functional, severe    ( Dr Juanda Chance);  Possible OCD with compulsive use of enemas and laxatives; psyhotic feaures 2011 H pylori (+) 2011 Psychosomatic disorder with GI fixation Anxiety Depression Stress incontinence Multiple drug allergies Vit D def Vit B12 def Renal insufficiency UTIs hypokalemia.... mild Insomnia Hyponatremia 2011 Breast cancer, hx of 2011  Review of Systems       The patient complains of difficulty walking, depression, and breast masses.  The patient denies fever, chest pain, dyspnea on exertion, and prolonged cough.    Physical Exam  General:  appears depressed; communicates in [name]  Chronically ill-appearing - not worse than before  Head:  Normocephalic and atraumatic without obvious abnormalities. No apparent alopecia or balding. Nose:  External nasal examination shows no deformity or inflammation. Nasal mucosa are pink and moist without lesions or exudates. Mouth:  No deformity or lesions, dentition normal. Neck:  Supple; no masses or thyromegaly. Lungs:  Clear throughout to auscultation. Heart:  Regular rate and rhythm; no murmurs, rubs,  or bruits. Abdomen:  soft abdomen with minimal tenderness in left lower quadrant and left middle quadrant. No distention. Liver edge at costal margin. Msk:  Lumbar-sacral spine is tender to palpation over paraspinal muscles and painfull with the ROM  Neurologic:  Ataxic Skin:  Intact without suspicious lesions or rashes Cervical Nodes:  No lymphadenopathy noted Psych:  anxious appearing, not agitated, not suicidal, depressed affect, flat affect, subdued and tearful.     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 1-2  by mouth once daily as needed constipation    Reglan 5 Mg Tabs (Metoclopramide hcl) .Marland Kitchen... Take 1 tab ac three times a day.  Problem # 4:  SYMPTOM, MEMORY LOSS (ICD-780.93) Assessment: Unchanged See Methadone Rx Risks incl overdose, death, confusion etc vs benefits and controversies of a long term controlled substances use were discussed.   Problem # 5:  B12 DEFICIENCY (ICD-266.2) Assessment: Unchanged  On the regimen of medicine(s) reflected in the chart    Problem # 6:  DEFICIENCY, VITAMIN D NOS (ICD-268.9) Assessment: Unchanged  Problem # 7:  CORONARY ARTERY DISEASE (ICD-414.00) Assessment: Unchanged Per Cardiology Her updated medication  list for this problem includes:    Nitroquick 0.4 Mg Subl (Nitroglycerin) .Marland Kitchen... As needed    Metoprolol Succinate 25 Mg Tb24 (Metoprolol succinate) .Marland Kitchen... 1 tab once daily    Aspir-low 81 Mg Tbec (Aspirin) .Marland Kitchen... 1 once daily pc  Complete Medication List: 1)  Methadone Hcl 10 Mg Tabs (Methadone hcl) .... Take 1 tablets by mouth up to 5  times per day as needed please,  fill on or after    06/21/10   . ok to fill 1-2 day earlier when the month has 31 days in it. 2)  Lorazepam 1 Mg Tabs (Lorazepam) .... As needed 1 by mouth two times a day -- qid prn 3)  Nitroquick 0.4 Mg Subl (Nitroglycerin) .... As needed 4)  Metoprolol Succinate 25 Mg Tb24 (Metoprolol succinate) .Marland Kitchen.. 1 tab once daily 5)  Trimethoprim 100 Mg Tabs (Trimethoprim) .... Once daily 6)  Aspir-low 81 Mg Tbec (Aspirin) .Marland Kitchen.. 1 once daily pc 7)  Zolpidem Tartrate 10 Mg Tabs (Zolpidem tartrate) .Marland Kitchen.. 1at bedtime for insomnia medically necessary to have 30 tabs every 30 days 8)  Premarin 0.625 Mg/gm Crea (Estrogens, conjugated)  .Marland Kitchen.. 1 g pv twice a week 9)  Triamcinolone Acetonide 0.1 % Oint (Triamcinolone acetonide) .... Use 1-2 times a day 10)  Vitamin D (ergocalciferol) 50000 Unit Caps (Ergocalciferol) .Marland Kitchen.. 1 by mouth q 1 month 11)  Promethazine Hcl 25 Mg Tabs (Promethazine hcl) .Marland Kitchen.. 1-2 by mouth four times a day as needed nausea 12)  Klor-con M10 10 Meq Cr-tabs (Potassium chloride crys cr) .Marland Kitchen.. 1 by mouth bid 13)  Amitiza 24 Mcg Caps (Lubiprostone) .Marland Kitchen.. 1-2  by mouth once daily as needed constipation 14)  Reglan 5 Mg Tabs (Metoclopramide hcl) .... Take 1 tab ac three times a day. 15)  Omeprazole 20 Mg Cpdr (Omeprazole) .... Take 1 twice daily for 10 days. 16)  Diphenhydramine Hcl 25 Mg Caps (Diphenhydramine hcl) .Marland Kitchen.. 1-2 by mouth qid as needed allergy 17)  Lactulose 20 Gm/74ml Soln (Lactulose) .... Use 30-60 cc prn 18)  Gelnique 10 % Gel (Oxybutynin chloride) .... Use on skin once daily for bladder issues 19)  Divalproex Sodium 250 Mg Tbec (Divalproex sodium) .Marland Kitchen.. 1 by mouth bid  Patient Instructions: 1)  Please schedule a follow-up appointment in 2 months. Prescriptions: OMEPRAZOLE 20 MG  CPDR (OMEPRAZOLE) Take 1 twice daily for 10 days.  #20 x 11   Entered and Authorized by:   Tresa Garter MD   Signed by:   Tresa Garter MD on 04/17/2010   Method used:   Print then Give to Patient   RxID:   [id number] DIPHENHYDRAMINE HCL 25 MG CAPS (DIPHENHYDRAMINE HCL) 1-2 by mouth qid as needed allergy  #60 Capsule x 1   Entered and Authorized by:   Tresa Garter MD   Signed by:   Tresa Garter MD on 04/17/2010   Method used:   Print then Give to Patient   RxID:   [id number] METHADONE HCL 10 MG  TABS (METHADONE HCL) Take 1 tablets by mouth up to 5  times per day as needed Please,  fill on or after    06/21/10   . OK to fill 1-2 day earlier when the month has 31 days in it.  #150 x 0   Entered and Authorized by:   Tresa Garter MD   Signed by:   Tresa Garter MD on  04/17/2010   Method used:   Print then Give to  Patient   RxID:   [id number] METHADONE HCL 10 MG  TABS (METHADONE HCL) Take 1 tablets by mouth up to 5  times per day as needed Please,  fill on or after    05/21/10   . OK to fill 1-2 day earlier when the month has 31 days in it.  #150 x 0   Entered and Authorized by:   Tresa Garter MD   Signed by:   Tresa Garter MD on 04/17/2010   Method used:   Print then Give to Patient   RxID:   [id number]    Orders Added: 1)  Est. Patient Level IV [84696]

## 2010-06-08 NOTE — Letter (Addendum)
Summary: [facility]   Imported By: Sherian Rein 05/05/2010 09:17:10  _____________________________________________________________________  External Attachment:    Type:   Image     Comment:   External Document

## 2010-06-08 NOTE — Letter (Signed)
Summary: [facility]   Imported By: Sherian Rein 05/24/2010 14:38:41  _____________________________________________________________________  External Attachment:    Type:   Image     Comment:   External Document

## 2010-06-08 NOTE — Progress Notes (Signed)
Summary: Records Request   Faxed OV & EKG to [name] at [facility] ([phone]).  Debby Freiberg  April 20, 2010 10:17 AM

## 2010-06-14 ENCOUNTER — Ambulatory Visit
Admission: RE | Admit: 2010-06-14 | Discharge: 2010-06-14 | Disposition: A | Discharge status: Home / Self Care | Payer: Medicare Other | Source: Ambulatory Visit | Attending: Oncology | Admitting: Oncology

## 2010-06-14 DIAGNOSIS — Z853 Personal history of malignant neoplasm of breast: Secondary | ICD-10-CM

## 2010-06-14 DIAGNOSIS — Z780 Asymptomatic menopausal state: Secondary | ICD-10-CM

## 2010-06-19 ENCOUNTER — Encounter: Payer: Self-pay | Admitting: Internal Medicine

## 2010-06-19 ENCOUNTER — Ambulatory Visit (INDEPENDENT_AMBULATORY_CARE_PROVIDER_SITE_OTHER): Payer: Medicare Other | Admitting: Internal Medicine

## 2010-06-19 DIAGNOSIS — F429 Obsessive-compulsive disorder, unspecified: Secondary | ICD-10-CM

## 2010-06-19 DIAGNOSIS — E538 Deficiency of other specified B group vitamins: Secondary | ICD-10-CM

## 2010-06-19 DIAGNOSIS — R350 Frequency of micturition: Secondary | ICD-10-CM

## 2010-06-19 DIAGNOSIS — Z853 Personal history of malignant neoplasm of breast: Secondary | ICD-10-CM

## 2010-06-28 NOTE — Assessment & Plan Note (Signed)
Summary: 2 MO F/U--STC   Vital Signs:  Patient profile:   77 year old female Height:      60 inches Weight:      149 pounds BMI:     29.20 O2 Sat:      92 % on Room air Temp:     98.2 degrees F oral Pulse rate:   74 / minute Pulse rhythm:   regular BP sitting:   126 / 80  (left arm) Cuff size:   regular  Vitals Entered By: Rock Nephew CMA (June 19, 2010 9:17 AM)  O2 Flow:  Room air CC: follow-up visit Is Patient Diabetic? No  Does patient need assistance? Functional Status Self care Ambulation Normal   Primary Care Provider:  Jacinta Shoe, MD  CC:  follow-up visit.  History of Present Illness: The patient presents for a follow up of abd. pain, anxiety, depression and breast cancer. C/o bladder urgency - saw Dr Vonita Moss - she was given  antibiotic - had IM inj x 3 and bladder irrigation x 3  Current Medications (verified): 1)  Methadone Hcl 10 Mg  Tabs (Methadone Hcl) .... Take 1 Tablets By Mouth Up To 5  Times Per Day As Needed Please,  Fill On or After    06/21/10   . Ok To Fill 1-2 Day Earlier When The Month Has 31 Days in It. 2)  Lorazepam 1 Mg  Tabs (Lorazepam) .... As Needed 1 By Mouth Two Times A Day -- Qid Prn 3)  Nitroquick 0.4 Mg  Subl (Nitroglycerin) .... As Needed 4)  Metoprolol Succinate 25 Mg  Tb24 (Metoprolol Succinate) .Marland Kitchen.. 1 Tab Once Daily 5)  Trimethoprim 100 Mg  Tabs (Trimethoprim) .... Once Daily 6)  Aspir-Low 81 Mg Tbec (Aspirin) .Marland Kitchen.. 1 Once Daily Pc 7)  Zolpidem Tartrate 10 Mg  Tabs (Zolpidem Tartrate) .Marland Kitchen.. 1at Bedtime For Insomnia Medically Necessary To Have 30 Tabs Every 30 Days 8)  Premarin 0.625 Mg/gm Crea (Estrogens, Conjugated) .Marland Kitchen.. 1 G Pv Twice A Week 9)  Triamcinolone Acetonide 0.1 % Oint (Triamcinolone Acetonide) .... Use 1-2 Times A Day 10)  Vitamin D (Ergocalciferol) 50000 Unit Caps (Ergocalciferol) .Marland Kitchen.. 1 By Mouth Q 1 Month 11)  Promethazine Hcl 25 Mg Tabs (Promethazine Hcl) .Marland Kitchen.. 1-2 By Mouth Four Times A Day As Needed  Nausea 12)  Klor-Con M10 10 Meq Cr-Tabs (Potassium Chloride Crys Cr) .Marland Kitchen.. 1 By Mouth Bid 13)  Amitiza 24 Mcg Caps (Lubiprostone) .Marland Kitchen.. 1-2  By Mouth Once Daily As Needed Constipation 14)  Reglan 5 Mg  Tabs (Metoclopramide Hcl) .... Take 1 Tab Ac Three Times A Day. 15)  Omeprazole 20 Mg  Cpdr (Omeprazole) .... Take 1 Twice Daily For 10 Days. 16)  Diphenhydramine Hcl 25 Mg Caps (Diphenhydramine Hcl) .Marland Kitchen.. 1-2 By Mouth Qid As Needed Allergy 17)  Lactulose 20 Gm/28ml Soln (Lactulose) .... Use 30-60 Cc Prn 18)  Gelnique 10 % Gel (Oxybutynin Chloride) .... Use On Skin Once Daily For Bladder Issues 19)  Divalproex Sodium 250 Mg Tbec (Divalproex Sodium) .Marland Kitchen.. 1 By Mouth Bid  Allergies (verified): 1)  ! Penicillin 2)  ! Sulfa 3)  ! Erythromycin 4)  ! Novocain 5)  ! * Ivp Dye 6)  ! Doxycycline 7)  ! Cipro 8)  ! * Potassium 9)  ! * Dicoine 10)  ! * Levomithicine 11)  ! Nubain (Nalbuphine Hcl) 12)  ! * Belladonna 13)  ! * Gabapentin 14)  ! 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  She does not smoke or use alcohol, and is  currently living with her husband.   Past Medical History: CAD .Marland Kitchencatheterization January, 2007.. patent grafts  /  hospitalization October, 2010.Marland Kitchen no MI CABG Syncope... March 28, 2009  ??orthostasis ?? Carotid artery disease... Doppler... March 30, 2009... 0-39% LICA, 60-79% RICA .Marland Kitchen increased velocity.. possibly from serpentine vessel EF  50-55%...... no wall motion abnormalities.Marland Kitchenecho March 30, 2009. Allergic rhinitis Colonic polyps, hx of Diverticulosis, colon.Marland Kitchen GERD Hyperlipidemia Abd. pain...Marland KitchenMarland KitchenMarland Kitchen motility disorder,  chronic functional, severe    ( Dr Juanda Chance);  Possible OCD with compulsive use of enemas and laxatives; psyhotic feaures 2011 H pylori (+) 2011 Psychosomatic disorder with GI fixation Anxiety Depression Stress incontinence Multiple drug allergies Vit D def Vit B12 def Renal insufficiency UTIs hypokalemia.... mild Insomnia Hyponatremia 2011 Breast cancer, hx of 2011 Dr Welton Flakes  Past Surgical History: Coronary artery bypass graft Ovarian and Uterine Fibroid tumor excision Fibroadenoma Left breast- Bilateral breast surgeyr L hemicolectomy 2005 Lumpectomy 2011  Review of Systems       The patient complains of weight loss, abdominal pain, severe indigestion/heartburn, and depression.  The patient denies fever, chest pain, and dyspnea on exertion.    Physical Exam  General:  appears depressed; communicates in [name]  Chronically ill-appearing - not worse than before  Eyes:  watery bags under eyes are less prominent Ears:  WNL Nose:  External nasal examination shows no deformity or inflammation. Nasal mucosa are pink and moist without lesions or exudates. Mouth:  No deformity or lesions, dentition normal. Neck:  Supple; no masses or thyromegaly. Lungs:  Clear throughout to auscultation. Heart:  Regular rate and rhythm; no murmurs, rubs,  or bruits. Abdomen:  soft abdomen with minimal tenderness in left lower quadrant and left middle quadrant. No distention. Liver edge at costal margin. Msk:  Lumbar-sacral spine is tender to palpation over paraspinal muscles and painfull with the ROM  Pulses:  R and L carotid,radial,femoral,dorsalis pedis and posterior tibial pulses are full and equal bilaterally Neurologic:  Ataxic Skin:  Intact without suspicious lesions or rashes Cervical Nodes:  No lymphadenopathy noted Inguinal Nodes:  No significant adenopathy Psych:  anxious appearing, not agitated, not suicidal, depressed affect, flat affect, subdued and tearful.     Impression &  Recommendations:  Problem # 1:  URINARY RETENTION (ICD-788.20) Assessment Improved S/p eval and Rx by Dr Vonita Moss  Problem # 2:  FREQUENCY, URINARY (ICD-788.41) Assessment: Improved  as above  Problem # 3:  CHRONIC PAIN SYNDROME (ICD-338.4) Assessment: Unchanged On the regimen of medicine(s) reflected in the chart    Problem # 4:  B12 DEFICIENCY (ICD-266.2) Assessment: Improved  On the regimen of medicine(s) reflected in the chart    Orders: Admin of Therapeutic Inj  intramuscular or subcutaneous (40102) Vit B12 1000 mcg (J3420)  Problem # 5:  ABDOMINAL PAIN, CHRONIC (ICD-789.00) Assessment: Unchanged She is getting some kind of analgesics from [place] - like Fioricet as Iunderstand. Risks of these kind of Rx use were discussed.  Her updated medication list for this problem includes:    Amitiza 24 Mcg Caps (Lubiprostone) .Marland Kitchen... 1-2  by mouth once daily as needed constipation    Reglan 5  Mg Tabs (Metoclopramide hcl) .Marland Kitchen... Take 1 tab ac three times a day.  Problem # 6:  BREAST CANCER, HX OF (ICD-V10.3) Assessment: New Rx per Dr Welton Flakes  Problem # 7:  OBSESSIVE-COMPULSIVE DISORDER (ICD-300.3) Assessment: Unchanged On the regimen of medicine(s) reflected in the chart    Complete Medication List: 1)  Methadone Hcl 10 Mg Tabs (Methadone hcl) .... Take 1 tablets by mouth up to 5  times per day as needed please,  fill on or after    08/20/10   . ok to fill 1-2 day earlier when the month has 31 days in it. 2)  Lorazepam 1 Mg Tabs (Lorazepam) .... As needed 1 by mouth two times a day -- qid prn 3)  Nitroquick 0.4 Mg Subl (Nitroglycerin) .... As needed 4)  Metoprolol Succinate 25 Mg Tb24 (Metoprolol succinate) .Marland Kitchen.. 1 tab bid 5)  Trimethoprim 100 Mg Tabs (Trimethoprim) .Marland Kitchen.. 1 po once daily 6)  Aspir-low 81 Mg Tbec (Aspirin) .Marland Kitchen.. 1 once daily pc 7)  Zolpidem Tartrate 10 Mg Tabs (Zolpidem tartrate) .Marland Kitchen.. 1at bedtime for insomnia medically necessary to have 30 tabs every 30 days 8)  Premarin  0.625 Mg/gm Crea (Estrogens, conjugated) .Marland Kitchen.. 1 g pv twice a week 9)  Triamcinolone Acetonide 0.1 % Oint (Triamcinolone acetonide) .... Use 1-2 times a day 10)  Vitamin D (ergocalciferol) 50000 Unit Caps (Ergocalciferol) .Marland Kitchen.. 1 by mouth q 1 month 11)  Promethazine Hcl 25 Mg Tabs (Promethazine hcl) .Marland Kitchen.. 1-2 by mouth four times a day as needed nausea 12)  Klor-con M10 10 Meq Cr-tabs (Potassium chloride crys cr) .Marland Kitchen.. 1 by mouth bid 13)  Amitiza 24 Mcg Caps (Lubiprostone) .Marland Kitchen.. 1-2  by mouth once daily as needed constipation 14)  Reglan 5 Mg Tabs (Metoclopramide hcl) .... Take 1 tab ac three times a day. 15)  Omeprazole 20 Mg Cpdr (Omeprazole) .... Take 1 twice daily for 10 days. 16)  Diphenhydramine Hcl 25 Mg Caps (Diphenhydramine hcl) .Marland Kitchen.. 1-2 by mouth qid as needed allergy 17)  Lactulose 20 Gm/21ml Soln (Lactulose) .... Use 30-60 cc prn 18)  Gelnique 10 % Gel (Oxybutynin chloride) .... Use on skin once daily for bladder issues 19)  Divalproex Sodium 250 Mg Tbec (Divalproex sodium) .Marland Kitchen.. 1 by mouth bid  Patient Instructions: 1)  Please schedule a follow-up appointment in 2 months. 2)  BMP prior to visit, ICD-9: 3)  Hepatic Panel prior to visit, ICD-9: 4)  TSH prior to visit, ICD-9: 995.20 Prescriptions: METHADONE HCL 10 MG  TABS (METHADONE HCL) Take 1 tablets by mouth up to 5  times per day as needed Please,  fill on or after    08/20/10   . OK to fill 1-2 day earlier when the month has 31 days in it.  #150 x 0   Entered and Authorized by:   Tresa Garter MD   Signed by:   Tresa Garter MD on 06/19/2010   Method used:   Print then Give to Patient   RxID:   [id number] METHADONE HCL 10 MG  TABS (METHADONE HCL) Take 1 tablets by mouth up to 5  times per day as needed Please,  fill on or after    07/20/10   . OK to fill 1-2 day earlier when the month has 31 days in it.  #150 x 0   Entered and Authorized by:   Tresa Garter MD   Signed by:   Tresa Garter MD on  06/19/2010   Method used:  Print then Give to Patient   RxID:   [id number] TRIMETHOPRIM 100 MG  TABS (TRIMETHOPRIM) 1 po once daily  #30 x 11   Entered and Authorized by:   Tresa Garter MD   Signed by:   Tresa Garter MD on 06/19/2010   Method used:   Electronically to        [facility] [address] [phone]* (retail)       [address]       [place], [place]  [address]       Ph: [phone] or [phone]       Fax: [phone]   RxID:   [id number] METOPROLOL SUCCINATE 25 MG  TB24 (METOPROLOL SUCCINATE) 1 tab bid  #60 x 11   Entered and Authorized by:   Tresa Garter MD   Signed by:   Tresa Garter MD on 06/19/2010   Method used:   [facility]ron[facility] to        [facility] [address] [phone]* (retail)       [address]       [place], [place]  [address]       Ph: [phone] or [phone]       Fax: [phone]   RxID:   [id number]    Medication Administration  Injection # 1:    Medication: Vit B12 1000 mcg    Diagnosis: B12 DEFICIENCY (ICD-266.2)    Route: IM    Site: L deltoid    Exp Date: 03/2012    Lot #: [id number]    Mfr: [employer] Regent    Patient tolerated injection without complications    Given by: Rock Nephew CMA (June 19, 2010 10:36 AM)  Orders Added: 1)  Est. Patient Level IV [27035] 2)  Admin of Therapeutic Inj  intramuscular or subcutaneous [96372] 3)  Vit B12 1000 mcg [J3420]     Medication Administration  Injection # 1:    Medication: Vit B12 1000 mcg    Diagnosis: B12 DEFICIENCY (ICD-266.2)    Route: IM    Site: L deltoid    Exp Date: 03/2012    Lot #: [id number]    Mfr: [employer] Regent    Patient tolerated injection without complications    Given by: Rock Nephew CMA (June 19, 2010 10:36 AM)  Orders Added: 1)  Est. Patient Level IV [00938] 2)  Admin of Therapeutic Inj  intramuscular or subcutaneous [96372] 3)  Vit B12 1000 mcg [J3420]

## 2010-07-03 ENCOUNTER — Inpatient Hospital Stay (INDEPENDENT_AMBULATORY_CARE_PROVIDER_SITE_OTHER)
Admission: RE | Admit: 2010-07-03 | Discharge: 2010-07-03 | Disposition: A | Discharge status: Home / Self Care | Payer: Medicare Other | Source: Ambulatory Visit | Attending: Family Medicine | Admitting: Family Medicine

## 2010-07-03 DIAGNOSIS — R339 Retention of urine, unspecified: Secondary | ICD-10-CM

## 2010-07-03 LAB — POCT URINALYSIS DIPSTICK
Bilirubin Urine: NEGATIVE
Ketones, ur: NEGATIVE mg/dL
Protein, ur: NEGATIVE mg/dL
Specific Gravity, Urine: 1.015 (ref 1.005–1.030)

## 2010-07-06 LAB — URINE CULTURE
Colony Count: >=100000
Culture  Setup Time: 201202272037

## 2010-07-07 ENCOUNTER — Encounter: Payer: Self-pay | Admitting: Internal Medicine

## 2010-07-17 ENCOUNTER — Encounter (HOSPITAL_BASED_OUTPATIENT_CLINIC_OR_DEPARTMENT_OTHER): Payer: Medicare Other | Admitting: Oncology

## 2010-07-17 ENCOUNTER — Other Ambulatory Visit: Payer: Self-pay | Admitting: Oncology

## 2010-07-17 DIAGNOSIS — C50219 Malignant neoplasm of upper-inner quadrant of unspecified female breast: Secondary | ICD-10-CM

## 2010-07-17 LAB — COMPREHENSIVE METABOLIC PANEL
AST: 29 U/L (ref 0–37)
Albumin: 3.5 g/dL (ref 3.5–5.2)
BUN: 15 mg/dL (ref 6–23)
Creatinine, Ser: 1.13 mg/dL (ref 0.4–1.2)
GFR calc Af Amer: 56 mL/min — ABNORMAL LOW (ref >60)
Total Protein: 5.8 g/dL — ABNORMAL LOW (ref 6.0–8.3)

## 2010-07-17 LAB — DIFFERENTIAL
Basophils Absolute: 0 10*3/uL (ref 0.0–0.1)
Eosinophils Relative: 5 % (ref 0–5)
Lymphocytes Relative: 18 % (ref 12–46)
Lymphs Abs: 1.2 10*3/uL (ref 0.7–4.0)
Monocytes Absolute: 0.7 10*3/uL (ref 0.1–1.0)
Monocytes Relative: 11 % (ref 3–12)
Neutro Abs: 4.2 10*3/uL (ref 1.7–7.7)

## 2010-07-17 LAB — CBC WITH DIFFERENTIAL/PLATELET
BASO%: 0.4 % (ref 0.0–2.0)
Basophils Absolute: 0 10*3/uL (ref 0.0–0.1)
EOS%: 10 % — ABNORMAL HIGH (ref 0.0–7.0)
HGB: 11.3 g/dL — ABNORMAL LOW (ref 11.6–15.9)
MCH: 30.2 pg (ref 25.1–34.0)
MCHC: 33.7 g/dL (ref 31.5–36.0)
MCV: 89.5 fL (ref 79.5–101.0)
MONO%: 11.5 % (ref 0.0–14.0)
NEUT%: 64.9 % (ref 38.4–76.8)
RDW: 14.5 % (ref 11.2–14.5)
lymph#: 0.9 10*3/uL (ref 0.9–3.3)

## 2010-07-17 LAB — CBC
MCH: 31.5 pg (ref 26.0–34.0)
MCV: 94.8 fL (ref 78.0–100.0)
Platelets: 179 10*3/uL (ref 150–400)
RDW: 12.9 % (ref 11.5–15.5)
WBC: 6.4 10*3/uL (ref 4.0–10.5)

## 2010-07-18 LAB — COMPREHENSIVE METABOLIC PANEL
Albumin: 3.6 g/dL (ref 3.5–5.2)
Alkaline Phosphatase: 161 U/L — ABNORMAL HIGH (ref 39–117)
CO2: 28 mEq/L (ref 19–32)
Calcium: 8.8 mg/dL (ref 8.4–10.5)
Chloride: 92 mEq/L — ABNORMAL LOW (ref 96–112)
Glucose, Bld: 102 mg/dL — ABNORMAL HIGH (ref 70–99)
Potassium: 3.6 mEq/L (ref 3.5–5.3)
Sodium: 129 mEq/L — ABNORMAL LOW (ref 135–145)
Total Protein: 5.8 g/dL — ABNORMAL LOW (ref 6.0–8.3)

## 2010-07-20 LAB — CBC
HCT: 32.5 % — ABNORMAL LOW (ref 36.0–46.0)
Hemoglobin: 11.3 g/dL — ABNORMAL LOW (ref 12.0–15.0)
MCH: 32.9 pg (ref 26.0–34.0)
MCHC: 34.8 g/dL (ref 30.0–36.0)

## 2010-07-20 LAB — BASIC METABOLIC PANEL
CO2: 28 mEq/L (ref 19–32)
Glucose, Bld: 98 mg/dL (ref 70–99)
Potassium: 3.4 mEq/L — ABNORMAL LOW (ref 3.5–5.1)
Sodium: 128 mEq/L — ABNORMAL LOW (ref 135–145)

## 2010-07-20 LAB — URINE CULTURE

## 2010-07-20 LAB — URINALYSIS, ROUTINE W REFLEX MICROSCOPIC
Bilirubin Urine: NEGATIVE
Hgb urine dipstick: NEGATIVE
Ketones, ur: NEGATIVE mg/dL
Specific Gravity, Urine: 1.012 (ref 1.005–1.030)
Urobilinogen, UA: 1 mg/dL (ref 0.0–1.0)

## 2010-07-20 LAB — DIFFERENTIAL
Basophils Relative: 2 % — ABNORMAL HIGH (ref 0–1)
Eosinophils Absolute: 0.2 10*3/uL (ref 0.0–0.7)
Monocytes Absolute: 0.8 10*3/uL (ref 0.1–1.0)
Monocytes Relative: 12 % (ref 3–12)

## 2010-07-20 LAB — URINE MICROSCOPIC-ADD ON

## 2010-07-21 ENCOUNTER — Emergency Department (HOSPITAL_COMMUNITY): Payer: Medicare Other

## 2010-07-21 ENCOUNTER — Inpatient Hospital Stay (HOSPITAL_COMMUNITY)
Admission: EM | Admit: 2010-07-21 | Discharge: 2010-07-24 | DRG: 312 | Disposition: A | Discharge status: Home Health | Payer: Medicare Other | Attending: Internal Medicine | Admitting: Internal Medicine

## 2010-07-21 ENCOUNTER — Other Ambulatory Visit: Payer: Self-pay | Admitting: Emergency Medicine

## 2010-07-21 DIAGNOSIS — Z951 Presence of aortocoronary bypass graft: Secondary | ICD-10-CM

## 2010-07-21 DIAGNOSIS — E559 Vitamin D deficiency, unspecified: Secondary | ICD-10-CM | POA: Diagnosis present

## 2010-07-21 DIAGNOSIS — B952 Enterococcus as the cause of diseases classified elsewhere: Secondary | ICD-10-CM | POA: Diagnosis present

## 2010-07-21 DIAGNOSIS — I2510 Atherosclerotic heart disease of native coronary artery without angina pectoris: Secondary | ICD-10-CM | POA: Diagnosis present

## 2010-07-21 DIAGNOSIS — N390 Urinary tract infection, site not specified: Secondary | ICD-10-CM | POA: Diagnosis present

## 2010-07-21 DIAGNOSIS — I10 Essential (primary) hypertension: Secondary | ICD-10-CM | POA: Diagnosis present

## 2010-07-21 DIAGNOSIS — Z8611 Personal history of tuberculosis: Secondary | ICD-10-CM

## 2010-07-21 DIAGNOSIS — G8929 Other chronic pain: Secondary | ICD-10-CM | POA: Diagnosis present

## 2010-07-21 DIAGNOSIS — I951 Orthostatic hypotension: Principal | ICD-10-CM | POA: Diagnosis present

## 2010-07-21 DIAGNOSIS — R109 Unspecified abdominal pain: Secondary | ICD-10-CM | POA: Diagnosis present

## 2010-07-21 DIAGNOSIS — N179 Acute kidney failure, unspecified: Secondary | ICD-10-CM | POA: Diagnosis present

## 2010-07-21 DIAGNOSIS — E785 Hyperlipidemia, unspecified: Secondary | ICD-10-CM | POA: Diagnosis present

## 2010-07-21 LAB — HEPATIC FUNCTION PANEL
ALT: 18 U/L (ref 0–35)
Albumin: 2.9 g/dL — ABNORMAL LOW (ref 3.5–5.2)
Alkaline Phosphatase: 135 U/L — ABNORMAL HIGH (ref 39–117)
Indirect Bilirubin: 0.5 mg/dL (ref 0.3–0.9)
Total Bilirubin: 0.6 mg/dL (ref 0.3–1.2)

## 2010-07-21 LAB — POCT I-STAT, CHEM 8
BUN: 18 mg/dL (ref 6–23)
Calcium, Ion: 1.08 mmol/L — ABNORMAL LOW (ref 1.12–1.32)
Chloride: 90 mEq/L — ABNORMAL LOW (ref 96–112)
Potassium: 3.2 mEq/L — ABNORMAL LOW (ref 3.5–5.1)

## 2010-07-21 LAB — POCT CARDIAC MARKERS
CKMB, poc: <1 ng/mL — ABNORMAL LOW (ref 1.0–8.0)
Myoglobin, poc: 86.7 ng/mL (ref 12–200)
Troponin i, poc: <0.05 ng/mL (ref 0.00–0.09)

## 2010-07-21 LAB — CARDIAC PANEL(CRET KIN+CKTOT+MB+TROPI)
CK, MB: 1.3 ng/mL (ref 0.3–4.0)
Troponin I: 0.01 ng/mL (ref 0.00–0.06)

## 2010-07-21 LAB — URINALYSIS, ROUTINE W REFLEX MICROSCOPIC
Nitrite: NEGATIVE
Specific Gravity, Urine: 1.012 (ref 1.005–1.030)
Urobilinogen, UA: 0.2 mg/dL (ref 0.0–1.0)
pH: 6.5 (ref 5.0–8.0)

## 2010-07-21 LAB — DIFFERENTIAL
Basophils Absolute: 0 10*3/uL (ref 0.0–0.1)
Basophils Relative: 0 % (ref 0–1)
Eosinophils Absolute: 0.5 10*3/uL (ref 0.0–0.7)
Lymphs Abs: 0.8 10*3/uL (ref 0.7–4.0)
Neutrophils Relative %: 60 % (ref 43–77)

## 2010-07-21 LAB — CBC
Platelets: 217 10*3/uL (ref 150–400)
RBC: 3.43 MIL/uL — ABNORMAL LOW (ref 3.87–5.11)
RDW: 13.5 % (ref 11.5–15.5)
WBC: 5.5 10*3/uL (ref 4.0–10.5)

## 2010-07-21 LAB — URINE MICROSCOPIC-ADD ON

## 2010-07-22 ENCOUNTER — Inpatient Hospital Stay (HOSPITAL_COMMUNITY): Payer: Medicare Other

## 2010-07-22 LAB — BASIC METABOLIC PANEL
CO2: 30 mEq/L (ref 19–32)
Calcium: 8.6 mg/dL (ref 8.4–10.5)
Creatinine, Ser: 1.15 mg/dL (ref 0.4–1.2)
GFR calc non Af Amer: 45 mL/min — ABNORMAL LOW (ref >60)
Glucose, Bld: 137 mg/dL — ABNORMAL HIGH (ref 70–99)
Sodium: 132 mEq/L — ABNORMAL LOW (ref 135–145)

## 2010-07-22 LAB — CARDIAC PANEL(CRET KIN+CKTOT+MB+TROPI)
CK, MB: 1.2 ng/mL (ref 0.3–4.0)
Total CK: 60 U/L (ref 7–177)
Total CK: 45 U/L (ref 7–177)
Troponin I: 0.02 ng/mL (ref 0.00–0.06)

## 2010-07-22 LAB — CREATININE, URINE, RANDOM: Creatinine, Urine: 66.4 mg/dL

## 2010-07-23 LAB — BASIC METABOLIC PANEL
BUN: 9 mg/dL (ref 6–23)
CO2: 28 mEq/L (ref 19–32)
Calcium: 8.7 mg/dL (ref 8.4–10.5)
Chloride: 99 mEq/L (ref 96–112)
Creatinine, Ser: 1 mg/dL (ref 0.4–1.2)
Glucose, Bld: 96 mg/dL (ref 70–99)

## 2010-07-23 LAB — URINE CULTURE

## 2010-07-23 LAB — URINALYSIS, MICROSCOPIC ONLY
Bilirubin Urine: NEGATIVE
Glucose, UA: NEGATIVE mg/dL
Hgb urine dipstick: NEGATIVE
Protein, ur: NEGATIVE mg/dL
Specific Gravity, Urine: 1.014 (ref 1.005–1.030)

## 2010-07-23 LAB — POCT URINALYSIS DIP (DEVICE)
Bilirubin Urine: NEGATIVE
Ketones, ur: NEGATIVE mg/dL
Nitrite: NEGATIVE
pH: 6.5 (ref 5.0–8.0)

## 2010-07-23 LAB — CBC
HCT: 32.1 % — ABNORMAL LOW (ref 36.0–46.0)
Hemoglobin: 10.6 g/dL — ABNORMAL LOW (ref 12.0–15.0)
MCH: 29.9 pg (ref 26.0–34.0)
MCHC: 33 g/dL (ref 30.0–36.0)
MCV: 90.4 fL (ref 78.0–100.0)
RBC: 3.55 MIL/uL — ABNORMAL LOW (ref 3.87–5.11)

## 2010-07-24 LAB — CBC
Hemoglobin: 11.6 g/dL — ABNORMAL LOW (ref 12.0–15.0)
Platelets: 203 10*3/uL (ref 150–400)
RBC: 3.97 MIL/uL (ref 3.87–5.11)
WBC: 6.7 10*3/uL (ref 4.0–10.5)

## 2010-07-24 LAB — URINE CULTURE

## 2010-07-24 LAB — BASIC METABOLIC PANEL
CO2: 28 mEq/L (ref 19–32)
Chloride: 95 mEq/L — ABNORMAL LOW (ref 96–112)
GFR calc Af Amer: 56 mL/min — ABNORMAL LOW (ref >60)
Potassium: 4.3 mEq/L (ref 3.5–5.1)

## 2010-07-25 ENCOUNTER — Other Ambulatory Visit: Payer: Self-pay | Admitting: Internal Medicine

## 2010-07-25 NOTE — Letter (Signed)
[facility]   Imported By: Sherian Rein 07/19/2010 09:29:05  _____________________________________________________________________  External Attachment:    Type:   Image     Comment:   External Document

## 2010-07-26 ENCOUNTER — Emergency Department (HOSPITAL_COMMUNITY)
Admission: EM | Admit: 2010-07-26 | Discharge: 2010-07-26 | Disposition: A | Discharge status: Home / Self Care | Payer: Medicare Other | Attending: Emergency Medicine | Admitting: Emergency Medicine

## 2010-07-26 ENCOUNTER — Encounter: Payer: Self-pay | Admitting: Internal Medicine

## 2010-07-26 ENCOUNTER — Emergency Department (HOSPITAL_COMMUNITY): Payer: Medicare Other

## 2010-07-26 DIAGNOSIS — K219 Gastro-esophageal reflux disease without esophagitis: Secondary | ICD-10-CM | POA: Insufficient documentation

## 2010-07-26 DIAGNOSIS — Z951 Presence of aortocoronary bypass graft: Secondary | ICD-10-CM | POA: Insufficient documentation

## 2010-07-26 DIAGNOSIS — R0789 Other chest pain: Secondary | ICD-10-CM | POA: Insufficient documentation

## 2010-07-26 DIAGNOSIS — I10 Essential (primary) hypertension: Secondary | ICD-10-CM | POA: Insufficient documentation

## 2010-07-26 DIAGNOSIS — I252 Old myocardial infarction: Secondary | ICD-10-CM | POA: Insufficient documentation

## 2010-07-26 LAB — DIFFERENTIAL
Lymphocytes Relative: 22 % (ref 12–46)
Lymphs Abs: 1.3 10*3/uL (ref 0.7–4.0)
Monocytes Relative: 12 % (ref 3–12)
Neutro Abs: 3.4 10*3/uL (ref 1.7–7.7)
Neutrophils Relative %: 55 % (ref 43–77)

## 2010-07-26 LAB — POCT I-STAT, CHEM 8
BUN: 21 mg/dL (ref 6–23)
Calcium, Ion: 1.08 mmol/L — ABNORMAL LOW (ref 1.12–1.32)
Creatinine, Ser: 1.2 mg/dL (ref 0.4–1.2)
TCO2: 25 mmol/L (ref 0–100)

## 2010-07-26 LAB — POCT CARDIAC MARKERS
CKMB, poc: <1 ng/mL — ABNORMAL LOW (ref 1.0–8.0)
Myoglobin, poc: 62.4 ng/mL (ref 12–200)

## 2010-07-26 LAB — CBC
HCT: 32.4 % — ABNORMAL LOW (ref 36.0–46.0)
Hemoglobin: 10.7 g/dL — ABNORMAL LOW (ref 12.0–15.0)
MCH: 29 pg (ref 26.0–34.0)
MCV: 87.8 fL (ref 78.0–100.0)
RBC: 3.69 MIL/uL — ABNORMAL LOW (ref 3.87–5.11)

## 2010-08-01 NOTE — Discharge Summary (Signed)
[name], [name]          ACCOUNT NO.:  [id number]  MEDICAL RECORD NO.:  [id number]           PATIENT TYPE:  I  LOCATION:  1423                         FACILITY:  [facility]  PHYSICIAN:  Hartley Barefoot, MD    DATE OF BIRTH:  1928-01-30  DATE OF ADMISSION:  07/21/2010 DATE OF DISCHARGE:  07/24/2010                              DISCHARGE SUMMARY   DISCHARGE DIAGNOSES: 1. Urinary tract infection. 2. Near syncope secondary to polypharmacy and component of autonomic     dysfunction and orthostatic hypotension.  PAST MEDICAL HISTORY: 1. Bladder urgency, chronic abdominal pain followed by [name]. 2. Coronary artery disease, status post cath, January 2007. 3. Hypertension. 4. Hyperlipidemia. 5. Gastroesophageal reflux disease. 6. Allergic rhinitis. 7. History of colonic polyps. 8. History of tuberculosis. 9. History of renal insufficiency. 10.History of vitamin D deficiency. 11.History of recurrent urinary tract infection. 12.History of stress incontinence.  DISCHARGE MEDICATIONS: 1. Ceftin 250 mg 1 tablet by mouth twice daily. 2. Polyethylene glycol 17 g p.o. daily as needed for constipation. 3. Zolpidem 10 mg by mouth at bedtime as needed. 4. Diphenhydramine 25 mg 4 times a day as needed. 5. Metoprolol 1 tablet daily. 6. Promethazine 25 mg every 6 hours as needed. 7. Flomax 1 capsule by mouth daily. 8. Lorazepam 1 tablet by mouth twice daily as needed. 9. Methadone 1 tablet 5 times a day as needed for pain. 10.Omeprazole 20 mg p.o. twice daily. 11.Reglan 3 times a day with meals. 12.Triamcinolone cream topically twice daily as needed. 13.Vitamin D2 one capsule by mouth monthly 50,000 units.  DISPOSITION AND FOLLOWUP:  [name] will need to follow with her primary care physician within a week.  She will need also to follow with [name] within 1 or 2 weeks for followup her chronic abdominal pain and chronic bladder symptoms.  She will need to follow  with her PCP for a BMET, should follow up potassium level and sodium level.  Also further adjustment of her medication will need to be made during that visit. Consider decrease her methadone dose.  Also, her zolpidem was decreased to once a day and her metoprolol was decreased to once a day due to possible autonomic dysfunction.  BRIEF HISTORY OF PRESENT ILLNESS:  This is a very pleasant 77 year old with past medical history of chronic abdominal pain, hypertension, coronary artery disease, who was apparently in the kitchen when she fell down.  Family is not sure whether she passed out.  She was down for 5 minutes, but she was somewhat responsive.  The patient denied any chest pain, palpitation, shortness of breath.  She does complain of chronic abdominal pain, which she described as bladder pain.  She was found to have a UA with white blood cell 11-20.  ASSESSMENT AND PLAN: 1. Near syncope.  This is likely secondary to orthostatic hypotension,     maybe some mild autonomic dysfunction plus a component of     polypharmacy.  Her Ambien was changed to once a day.  Her methadone     was continued, but the patient was advised to use as needed.  Her     metoprolol dose  was decreased to once a day due to orthostatic     vitals.  During hospitalization, her orthostatic vitals improved     and she was asymptomatic.  The patient was instructed to use a     walker for ambulation.  The patient had 3 set of cardiac enzymes     that were negative. 2. Chronic abdominal pain.  The patient's abdominal pain is at     baseline at this time.  She was treated for possible urinary tract     infection. 3. Acute renal failure.  With gentle hydration, her creatinine     normalized and her creatinine on the day of discharge was at 1.3,     on the date of admission was at 1.7. 4. Vitamin D deficiency.  Continue with vitamin D supplement. 5. Urinary tract infection.  The patient was started on ceftriaxone      due to UA with white blood cell 11-20.  The patient had a urine     culture that grew enterococcus.  It was sensitive to ampicillin,     Levaquin, nitrofurantoin, and vancomycin.  The patient with     multiple allergies to oral medication.  I think that the     enterococcus is the colonization.  She had a prior enterococcus on     February 2012.  The patient was treated with ceftriaxone 3 days 1 g     IV and she tolerated this well.  Her white blood cell continued to     be normalized.  She has remained afebrile.  I will discharge her on     Ceftin p.o. b.i.d.  PROCEDURE PERFORMED:  Renal ultrasound showed mild sonographic appearance of kidney.  No evidence of hydronephrosis.  CT head show no acute intracranial abnormality.  She had stable cerebral atrophy and chronic white matter disease.  On the day of discharge, the patient was in improved condition.  She was asymptomatic and orthostatic vitals.  Blood pressure 150/87, sat 94 on room air, respirations 18, pulse 83, temperature 98.1.  LABORATORY DATA:  Urine culture, enterococcus sensitive to ampicillin, Levaquin, nitrofurantoin.  Sodium 130, potassium 4.3, chloride 95, CO2 28, glucose 118, BUN 12, creatinine 1.1, white blood cell 6.7, hemoglobin 11.6, platelet 203.  The patient was discharged in improved condition.     Hartley Barefoot, MD     BR/MEDQ  D:  07/24/2010  T:  07/25/2010  Job:  [id number]  Electronically Signed by Hartley Barefoot MD on 08/01/2010 02:35:38 PM

## 2010-08-03 NOTE — Letter (Signed)
Summary: Colonoscopy Date Change Letter  [facility]  [address] [place], [place] [address]   Phone: [phone]  Fax: [phone]      July 26, 2010 MRN: [id number]   MALAIYAH ACHORN [address] [place], [place]  [address]   Dear Ms. Perry,   Previously you were recommended to have a repeat colonoscopy around this time. Your chart was recently reviewed by Dr. Lina Sar of [facility]. Follow up colonoscopy is now recommended in March 2017. This revised recommendation is based on current, nationally recognized guidelines for colorectal cancer screening and polyp surveillance. These guidelines are endorsed by the American Cancer Society, [employer] on Colorectal Cancer as well as numerous other major medical organizations.  Please understand that our recommendation assumes that you do not have any new symptoms such as bleeding, a change in bowel habits, anemia, or significant abdominal discomfort. If you do have any concerning GI symptoms or want to discuss the guideline recommendations, please call to arrange an office visit at your earliest convenience. Otherwise we will keep you in our reminder system and contact you 1-2 months prior to the date listed above to schedule your next colonoscopy.  Thank you,  Hedwig Morton. Juanda Chance, M.D.  [facility] [phone]

## 2010-08-09 LAB — BASIC METABOLIC PANEL
CO2: 28 mEq/L (ref 19–32)
Calcium: 8.5 mg/dL (ref 8.4–10.5)
GFR calc Af Amer: 57 mL/min — ABNORMAL LOW (ref >60)
Glucose, Bld: 102 mg/dL — ABNORMAL HIGH (ref 70–99)
Potassium: 3.1 mEq/L — ABNORMAL LOW (ref 3.5–5.1)
Sodium: 133 mEq/L — ABNORMAL LOW (ref 135–145)

## 2010-08-09 LAB — DIFFERENTIAL
Eosinophils Absolute: 0.2 10*3/uL (ref 0.0–0.7)
Eosinophils Relative: 4 % (ref 0–5)
Lymphs Abs: 0.9 10*3/uL (ref 0.7–4.0)
Monocytes Absolute: 0.5 10*3/uL (ref 0.1–1.0)

## 2010-08-09 LAB — CBC
HCT: 30.7 % — ABNORMAL LOW (ref 36.0–46.0)
Hemoglobin: 10.9 g/dL — ABNORMAL LOW (ref 12.0–15.0)
MCHC: 34.9 g/dL (ref 30.0–36.0)
MCHC: 35.4 g/dL (ref 30.0–36.0)
Platelets: 190 10*3/uL (ref 150–400)
RBC: 3.36 MIL/uL — ABNORMAL LOW (ref 3.87–5.11)
RBC: 3.7 MIL/uL — ABNORMAL LOW (ref 3.87–5.11)
RDW: 14.9 % (ref 11.5–15.5)
WBC: 5.5 10*3/uL (ref 4.0–10.5)

## 2010-08-09 LAB — URINALYSIS, ROUTINE W REFLEX MICROSCOPIC
Nitrite: NEGATIVE
Specific Gravity, Urine: 1.009 (ref 1.005–1.030)
Urobilinogen, UA: 0.2 mg/dL (ref 0.0–1.0)
pH: 7 (ref 5.0–8.0)

## 2010-08-09 LAB — COMPREHENSIVE METABOLIC PANEL
ALT: 31 U/L (ref 0–35)
AST: 32 U/L (ref 0–37)
Albumin: 3.4 g/dL — ABNORMAL LOW (ref 3.5–5.2)
CO2: 30 mEq/L (ref 19–32)
Calcium: 8.5 mg/dL (ref 8.4–10.5)
Chloride: 93 mEq/L — ABNORMAL LOW (ref 96–112)
GFR calc Af Amer: 45 mL/min — ABNORMAL LOW (ref >60)
GFR calc non Af Amer: 37 mL/min — ABNORMAL LOW (ref >60)
Sodium: 130 mEq/L — ABNORMAL LOW (ref 135–145)

## 2010-08-09 LAB — URINE MICROSCOPIC-ADD ON

## 2010-08-09 LAB — LIPASE, BLOOD: Lipase: 12 U/L (ref 11–59)

## 2010-08-09 LAB — POCT CARDIAC MARKERS
CKMB, poc: <1 ng/mL — ABNORMAL LOW (ref 1.0–8.0)
Troponin i, poc: <0.05 ng/mL (ref 0.00–0.09)

## 2010-08-10 LAB — POCT I-STAT, CHEM 8
BUN: 16 mg/dL (ref 6–23)
Chloride: 91 mEq/L — ABNORMAL LOW (ref 96–112)
Creatinine, Ser: 1 mg/dL (ref 0.4–1.2)
Glucose, Bld: 100 mg/dL — ABNORMAL HIGH (ref 70–99)
HCT: 34 % — ABNORMAL LOW (ref 36.0–46.0)
Potassium: 2.9 mEq/L — ABNORMAL LOW (ref 3.5–5.1)

## 2010-08-10 LAB — CARDIAC PANEL(CRET KIN+CKTOT+MB+TROPI)
CK, MB: 1 ng/mL (ref 0.3–4.0)
Total CK: 73 U/L (ref 7–177)
Troponin I: 0.02 ng/mL (ref 0.00–0.06)

## 2010-08-10 LAB — URINALYSIS, ROUTINE W REFLEX MICROSCOPIC
Glucose, UA: NEGATIVE mg/dL
Hgb urine dipstick: NEGATIVE
Ketones, ur: NEGATIVE mg/dL
Protein, ur: NEGATIVE mg/dL
pH: 7 (ref 5.0–8.0)

## 2010-08-10 LAB — DIFFERENTIAL
Basophils Relative: 0 % (ref 0–1)
Eosinophils Absolute: 0.2 10*3/uL (ref 0.0–0.7)
Eosinophils Relative: 3 % (ref 0–5)
Lymphs Abs: 1.1 10*3/uL (ref 0.7–4.0)
Monocytes Absolute: 0.7 10*3/uL (ref 0.1–1.0)
Monocytes Relative: 11 % (ref 3–12)

## 2010-08-10 LAB — POCT CARDIAC MARKERS
CKMB, poc: <1 ng/mL — ABNORMAL LOW (ref 1.0–8.0)
Troponin i, poc: <0.05 ng/mL (ref 0.00–0.09)
Troponin i, poc: <0.05 ng/mL (ref 0.00–0.09)

## 2010-08-10 LAB — CK TOTAL AND CKMB (NOT AT ARMC)
CK, MB: 1 ng/mL (ref 0.3–4.0)
Relative Index: INVALID (ref 0.0–2.5)

## 2010-08-10 LAB — BASIC METABOLIC PANEL
BUN: 13 mg/dL (ref 6–23)
Calcium: 9.1 mg/dL (ref 8.4–10.5)
GFR calc non Af Amer: 37 mL/min — ABNORMAL LOW (ref >60)
Glucose, Bld: 97 mg/dL (ref 70–99)

## 2010-08-10 LAB — LIPID PANEL
HDL: 60 mg/dL (ref >39)
Total CHOL/HDL Ratio: 6.5 RATIO
Triglycerides: 261 mg/dL — ABNORMAL HIGH (ref <150)
VLDL: 52 mg/dL — ABNORMAL HIGH (ref 0–40)

## 2010-08-10 LAB — URINE MICROSCOPIC-ADD ON

## 2010-08-10 LAB — CBC
HCT: 33.1 % — ABNORMAL LOW (ref 36.0–46.0)
MCHC: 35.1 g/dL (ref 30.0–36.0)
MCV: 91.9 fL (ref 78.0–100.0)
RBC: 3.6 MIL/uL — ABNORMAL LOW (ref 3.87–5.11)
WBC: 5.9 10*3/uL (ref 4.0–10.5)

## 2010-08-10 LAB — PROTIME-INR: INR: 0.95 (ref 0.00–1.49)

## 2010-08-10 LAB — URINE CULTURE

## 2010-08-10 LAB — POTASSIUM: Potassium: 3.5 mEq/L (ref 3.5–5.1)

## 2010-08-12 LAB — COMPREHENSIVE METABOLIC PANEL
AST: 21 U/L (ref 0–37)
Albumin: 3.2 g/dL — ABNORMAL LOW (ref 3.5–5.2)
Calcium: 8.8 mg/dL (ref 8.4–10.5)
Creatinine, Ser: 1.04 mg/dL (ref 0.4–1.2)
GFR calc Af Amer: >60 mL/min (ref >60)
GFR calc non Af Amer: 51 mL/min — ABNORMAL LOW (ref >60)
Total Protein: 5.9 g/dL — ABNORMAL LOW (ref 6.0–8.3)

## 2010-08-12 LAB — BASIC METABOLIC PANEL
BUN: 12 mg/dL (ref 6–23)
Chloride: 96 mEq/L (ref 96–112)
Glucose, Bld: 107 mg/dL — ABNORMAL HIGH (ref 70–99)
Potassium: 3.3 mEq/L — ABNORMAL LOW (ref 3.5–5.1)

## 2010-08-13 LAB — DIFFERENTIAL
Basophils Absolute: 0 10*3/uL (ref 0.0–0.1)
Basophils Absolute: 0 10*3/uL (ref 0.0–0.1)
Eosinophils Relative: 3 % (ref 0–5)
Lymphocytes Relative: 17 % (ref 12–46)
Lymphocytes Relative: 21 % (ref 12–46)
Lymphs Abs: 1 10*3/uL (ref 0.7–4.0)
Lymphs Abs: 1 10*3/uL (ref 0.7–4.0)
Neutro Abs: 4 10*3/uL (ref 1.7–7.7)
Neutrophils Relative %: 65 % (ref 43–77)
Neutrophils Relative %: 71 % (ref 43–77)

## 2010-08-13 LAB — COMPREHENSIVE METABOLIC PANEL
AST: 23 U/L (ref 0–37)
BUN: 13 mg/dL (ref 6–23)
BUN: 8 mg/dL (ref 6–23)
CO2: 26 mEq/L (ref 19–32)
CO2: 29 mEq/L (ref 19–32)
Calcium: 8.6 mg/dL (ref 8.4–10.5)
Calcium: 8.9 mg/dL (ref 8.4–10.5)
Chloride: 87 mEq/L — ABNORMAL LOW (ref 96–112)
Creatinine, Ser: 1.2 mg/dL (ref 0.4–1.2)
Creatinine, Ser: 1.38 mg/dL — ABNORMAL HIGH (ref 0.4–1.2)
GFR calc Af Amer: 45 mL/min — ABNORMAL LOW (ref >60)
GFR calc Af Amer: 53 mL/min — ABNORMAL LOW (ref >60)
GFR calc non Af Amer: 37 mL/min — ABNORMAL LOW (ref >60)
GFR calc non Af Amer: 43 mL/min — ABNORMAL LOW (ref >60)
Glucose, Bld: 105 mg/dL — ABNORMAL HIGH (ref 70–99)
Total Bilirubin: 0.7 mg/dL (ref 0.3–1.2)

## 2010-08-13 LAB — URINALYSIS, ROUTINE W REFLEX MICROSCOPIC
Ketones, ur: NEGATIVE mg/dL
Nitrite: NEGATIVE
Specific Gravity, Urine: 1.007 (ref 1.005–1.030)
Urobilinogen, UA: 0.2 mg/dL (ref 0.0–1.0)
pH: 7 (ref 5.0–8.0)

## 2010-08-13 LAB — POCT CARDIAC MARKERS: Myoglobin, poc: 63.9 ng/mL (ref 12–200)

## 2010-08-13 LAB — CBC
HCT: 33.8 % — ABNORMAL LOW (ref 36.0–46.0)
HCT: 37.1 % (ref 36.0–46.0)
Hemoglobin: 11.6 g/dL — ABNORMAL LOW (ref 12.0–15.0)
MCHC: 34.4 g/dL (ref 30.0–36.0)
MCHC: 34.5 g/dL (ref 30.0–36.0)
MCV: 91.7 fL (ref 78.0–100.0)
MCV: 93.1 fL (ref 78.0–100.0)
RBC: 3.69 MIL/uL — ABNORMAL LOW (ref 3.87–5.11)
RBC: 3.99 MIL/uL (ref 3.87–5.11)

## 2010-08-13 LAB — URINALYSIS, MICROSCOPIC ONLY
Glucose, UA: NEGATIVE mg/dL
Ketones, ur: NEGATIVE mg/dL
pH: 6.5 (ref 5.0–8.0)

## 2010-08-13 LAB — URINE CULTURE
Colony Count: 50000
Colony Count: NO GROWTH

## 2010-08-13 LAB — LIPASE, BLOOD: Lipase: 10 U/L — ABNORMAL LOW (ref 11–59)

## 2010-08-14 LAB — URINE CULTURE

## 2010-08-14 LAB — POCT CARDIAC MARKERS
CKMB, poc: 2.4 ng/mL (ref 1.0–8.0)
Myoglobin, poc: 194 ng/mL (ref 12–200)

## 2010-08-14 LAB — CARDIAC PANEL(CRET KIN+CKTOT+MB+TROPI)
CK, MB: 3.6 ng/mL (ref 0.3–4.0)
Relative Index: 2.9 — ABNORMAL HIGH (ref 0.0–2.5)
Relative Index: 3.6 — ABNORMAL HIGH (ref 0.0–2.5)
Troponin I: 0.03 ng/mL (ref 0.00–0.06)
Troponin I: 0.03 ng/mL (ref 0.00–0.06)

## 2010-08-14 LAB — BASIC METABOLIC PANEL
BUN: 10 mg/dL (ref 6–23)
Chloride: 87 mEq/L — ABNORMAL LOW (ref 96–112)
Creatinine, Ser: 1.32 mg/dL — ABNORMAL HIGH (ref 0.4–1.2)
Glucose, Bld: 106 mg/dL — ABNORMAL HIGH (ref 70–99)

## 2010-08-14 LAB — DIFFERENTIAL
Lymphs Abs: 0.8 10*3/uL (ref 0.7–4.0)
Monocytes Relative: 12 % (ref 3–12)
Neutro Abs: 2.9 10*3/uL (ref 1.7–7.7)
Neutrophils Relative %: 67 % (ref 43–77)

## 2010-08-14 LAB — URINALYSIS, ROUTINE W REFLEX MICROSCOPIC
Glucose, UA: NEGATIVE mg/dL
Protein, ur: NEGATIVE mg/dL
Urobilinogen, UA: 0.2 mg/dL (ref 0.0–1.0)

## 2010-08-14 LAB — CBC
HCT: 35.4 % — ABNORMAL LOW (ref 36.0–46.0)
Hemoglobin: 12.4 g/dL (ref 12.0–15.0)
MCHC: 34.9 g/dL (ref 30.0–36.0)
RDW: 14.1 % (ref 11.5–15.5)

## 2010-08-14 LAB — COMPREHENSIVE METABOLIC PANEL
BUN: 9 mg/dL (ref 6–23)
Calcium: 9 mg/dL (ref 8.4–10.5)
Creatinine, Ser: 1.26 mg/dL — ABNORMAL HIGH (ref 0.4–1.2)
Glucose, Bld: 97 mg/dL (ref 70–99)
Total Protein: 6.5 g/dL (ref 6.0–8.3)

## 2010-08-14 LAB — VITAMIN D 1,25 DIHYDROXY
Vitamin D 1, 25 (OH)2 Total: 23 pg/mL (ref 18–72)
Vitamin D3 1, 25 (OH)2: 23 pg/mL

## 2010-08-14 LAB — CORTISOL-AM, BLOOD: Cortisol - AM: 21 ug/dL (ref 4.3–22.4)

## 2010-08-14 LAB — VITAMIN B12: Vitamin B-12: 499 pg/mL (ref 211–911)

## 2010-08-14 LAB — TSH: TSH: 1.042 u[IU]/mL (ref 0.350–4.500)

## 2010-08-16 LAB — URINALYSIS, ROUTINE W REFLEX MICROSCOPIC
Glucose, UA: NEGATIVE mg/dL
Ketones, ur: NEGATIVE mg/dL
Nitrite: NEGATIVE
pH: 6 (ref 5.0–8.0)

## 2010-08-16 LAB — DIFFERENTIAL
Eosinophils Absolute: 0.2 10*3/uL (ref 0.0–0.7)
Eosinophils Relative: 4 % (ref 0–5)
Lymphocytes Relative: 20 % (ref 12–46)
Lymphs Abs: 0.9 10*3/uL (ref 0.7–4.0)
Monocytes Absolute: 0.5 10*3/uL (ref 0.1–1.0)
Monocytes Relative: 11 % (ref 3–12)

## 2010-08-16 LAB — BASIC METABOLIC PANEL
BUN: 15 mg/dL (ref 6–23)
Chloride: 92 mEq/L — ABNORMAL LOW (ref 96–112)
GFR calc Af Amer: 42 mL/min — ABNORMAL LOW (ref >60)
GFR calc non Af Amer: 34 mL/min — ABNORMAL LOW (ref >60)
Potassium: 3.7 mEq/L (ref 3.5–5.1)
Sodium: 129 mEq/L — ABNORMAL LOW (ref 135–145)

## 2010-08-16 LAB — CBC
HCT: 35.8 % — ABNORMAL LOW (ref 36.0–46.0)
Hemoglobin: 11.8 g/dL — ABNORMAL LOW (ref 12.0–15.0)
MCV: 92.4 fL (ref 78.0–100.0)
RBC: 3.88 MIL/uL (ref 3.87–5.11)
WBC: 4.5 10*3/uL (ref 4.0–10.5)

## 2010-08-16 LAB — POCT CARDIAC MARKERS: Troponin i, poc: <0.05 ng/mL (ref 0.00–0.09)

## 2010-08-16 LAB — LIPID PANEL
Cholesterol: 367 mg/dL — ABNORMAL HIGH (ref 0–200)
HDL: 48 mg/dL (ref >39)

## 2010-08-16 LAB — CK TOTAL AND CKMB (NOT AT ARMC)
CK, MB: 0.9 ng/mL (ref 0.3–4.0)
Relative Index: INVALID (ref 0.0–2.5)

## 2010-08-16 LAB — TSH: TSH: 2.438 u[IU]/mL (ref 0.350–4.500)

## 2010-08-16 LAB — CARDIAC PANEL(CRET KIN+CKTOT+MB+TROPI)
CK, MB: 1 ng/mL (ref 0.3–4.0)
Total CK: 53 U/L (ref 7–177)
Troponin I: 0.01 ng/mL (ref 0.00–0.06)

## 2010-08-16 LAB — PROTIME-INR: Prothrombin Time: 13.4 seconds (ref 11.6–15.2)

## 2010-08-17 NOTE — H&P (Signed)
NAMECATALEAH, [name]          ACCOUNT NO.:  [id number]  MEDICAL RECORD NO.:  [id number]           PATIENT TYPE:  E  LOCATION:  WLED                         FACILITY:  [facility]  PHYSICIAN:  Massie Maroon, MD        DATE OF BIRTH:  [birth year]  DATE OF ADMISSION:  07/21/2010 DATE OF DISCHARGE:                             HISTORY & PHYSICAL   CHIEF COMPLAINT:  Near-syncope.  HISTORY OF PRESENT ILLNESS:  This is an 77 year old female with a history of chronic pain, hypertension, CAD, status post MI, bladder urgency, apparently presents with complaints of near-syncope.  She apparently was in the kitchen when she fell down.  Family is not sure whether she passed out.  She was down for about 5 minutes, but she was somewhat responsive.  Patient denies any chest pain, palpitation, shortness of breath, nausea, vomiting, lower extremity edema.  She does complain of chronic pain typically in the lower abdomen, which she describes as bladder pain.  Patient was brought to the ED for evaluation primarily by the family because of this lower abdominal discomfort where she has bladder pain as well as concern for near-syncope/syncope. Patient was noted to be in mild acute renal failure with the creatinine of 1.7.  Patient was also noted to have a urinary tract infection.  WBCs 11-20 on the urinalysis.  EKG showed normal sinus rhythm at 60, Q-waves in III, aVF; early R-wave progression; T-wave inversion in V3, V4; no ST- T segment changes consistent with acute ischemia; however, there is no EKG for comparison.  PAST MEDICAL HISTORY: 1. Bladder urgency, chronic abdominal pain, followed by Dr. Vonita Moss. 2. CAD, status post cath in January 2007 shows patent grafts. 3. CABG. 4. Carotid artery disease, status post carotid ultrasound on March 30, 2009, 0%-39% left internal carotid artery stenosis, 60%-79%     right internal carotid artery stenosis. 5. Hypertension. 6. Hyperlipidemia. 7.  GERD. 8. Allergic rhinitis. 9. History of colonic polyps. 10.History of diverticulosis. 11.History of renal insufficiency. 12.Vitamin D deficiency. 13.Vitamin B12 deficiency. 14.Recurrent UTI. 15.Hypokalemia. 16.Insomnia. 17.Anxiety/depression. 18.Stress incontinence. 19.Chronic abdominal pain/motility disorder, chronic functional,     severe, possible OCD with compulsive use of enemas and laxatives     with psychotic features in 2011. 20.H pylori positive in 2011. 21.Insomnia. 22.Hyponatremia in 2011. 23.History of breast cancer in 2011 followed by Dr. Welton Flakes.  PAST SURGICAL HISTORY: 1. CABG. 2. Ovarian and uterine fibroid tumor excision. 3. Fibroadenoma of the left breast, status post bilateral breast     surgery. 4. Left hemicolectomy in 2005. 5. Lumpectomy in 2011.  SOCIAL HISTORY:  Patient is a retired MD, married, never smoked, never used alcohol.  She is an immigrant from [place].  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  She currently lives with her husband.  FAMILY HISTORY:  Unknown due to language barrier.  ALLERGIES: 1. PENICILLIN. 2. SULFA. 3. ERYTHROMYCIN. 4. NOVOCAIN. 5. IVP DYE. 6. DOXYCYCLINE. 7. CIPRO. 8. POTASSIUM. 9. DYCLONINE. 10.LEVOMYCETIN. 11.NUBAIN. 12.BELLADONNA. 13.GABAPENTIN. 14.METRONIDAZOLE. 15.BACLOFEN. 16.CARAFATE. 17.SUCRALFATE. 18.MACROBID. 19.CONTRAST MEDIA for having IVP DYE.  MEDICATIONS: 1.  Flomax 0.4 mg p.o. daily. 2. Benadryl 25 mg one to two p.o. q.i.d. 3. Omeprazole 20 mg p.o. b.i.d. 4. Reglan 5 mg p.o. t.i.d. a.c. meals. 5. Potassium chloride 10 mEq p.o. b.i.d. 6. Promethazine 25 mg 1 to 2 p.o. q.4 h. p.r.n. 7. Vitamin D 50,000 international units 1 p.o. every month. 8. Triamcinolone topical ointment 0.1% one application b.i.d. p.r.n. 9. Ambien 10 mg p.o. b.i.d. 10.Trimethoprim 100 mg p.o. daily. 11.Metoprolol XL 25 mg p.o. b.i.d. 12.Lorazepam 1 mg p.o.  b.i.d. p.r.n. 13.Methadone 10 mg p.o. q.4 h. 5 times a day.  REVIEW OF SYSTEMS:  Unable to obtain due to language barrier.  PHYSICAL EXAMINATION:  VITAL SIGNS:  Temperature 99.5, pulse 62, blood pressure 135/83, pulse ox is 92% on room air. HEENT:  Anicteric.  Pupils 1 mm, symmetric, direct and consensual reflexes intact.  They are kind of pinpoint, suggesting that she is on morphine-based substances, which she is.  Tongue is midline.  Mucous membranes are moist. NECK:  No JVD, slight right carotid bruit. HEART:  Regular rate and rhythm.  S1 and S2.  No murmurs, gallops or rubs. LUNGS:  Clear to auscultation bilaterally from an anterior position. ABDOMEN:  Soft, nontender, nondistended.  Positive bowel sounds. EXTREMITIES:  No cyanosis, clubbing or edema.  No onychomycosis.  No clonus. NEUROLOGIC EXAMINATION:  Cranial nerves II through XII intact.  Reflexes are 2+ symmetric, diffuse with downgoing toes bilaterally.  Motor strength is 5/5 in all 4 extremities.  LABORATORY DATA:  BUN 18, creatinine 1.7, troponin-I less than 0.05. WBC 5.5, hemoglobin 10.4, platelet count is 217.  Urinalysis:  WBC is 11- 20.  AST 22, ALT 18, alk phos 135, total bilirubin 0.6.  DIAGNOSTIC STUDIES:  CT brain, no acute intracranial abnormality. Abdominal x-ray nonobstructive bowel gas pattern.  ASSESSMENT AND PLAN: 1. Near-syncope:  Patient fell down in the kitchen.  I wonder if she     could be overmedicated with sedative such as methadone and Ambien     twice a day.  This could be the reason why she fell.  It is not     really clear if she had any syncope; however, we will proceed with     the usual syncope workup including placing the patient on     telemetry, checking cardiac markers serially, checking a carotid     ultrasound and a cardiac 2-D echo.  Patient is mildly hypoxic, but     I believe that this is probably secondary to her pain medicine     rather than the fact that she would have a  pulmonary embolus.  She     is not tachycardic and she is not hypotensive and she does not have     clinical features of pulmonary embolism at this time. 2. Chronic abdominal pain:  This could be due to irritable bowel plus     some type of interstitial cystitis.  We defer to the expertise of     Dr. Larey Dresser and Dr. Lina Sar.  We will continue her on     her Flomax as well as Reglan and omeprazole and continue her on her     methadone for now, though we will hold for sedation.  The etiology     of her pain is not very clear to me at this time.  Consider     urological consult and gastrointestinal consult in the morning. 3. Acute renal failure:  We will check urine sodium, urine creatinine,  urine eosinophils, and the renal ultrasound.  She is on     trimethoprim and sulfa medicines, which apparently she is allergic     to can obviously cause an increase in your creatinine.  They can     also cause interstitial cystitis and she does have white blood     cells in her urine.  We will check urine studies as stated above.     We will consider withdrawing this medication, but for now, we will     hydrate gently with normal saline. 4. Vitamin D deficiency:  Continue vitamin D. 5. Insomnia:  We will reduce her zolpidem to 10 mg p.o. q.h.s.  We     will withhold her morning dose. 6. Anemia:  Deferred a team in the morning as to how extensive workup     you would like to proceed with.  Her     baseline hemoglobin appears to be 11.6 on April 20, 2010.  She     is slightly lower, but relatively stable.  Consider checking iron     studies, B12, folic acid, SPEP, UPEP, LDH, haptoglobin, TSH. 7. Deep venous thrombosis prophylaxis:  Sequential compression     devices.     Massie Maroon, MD     JYK/MEDQ  D:  07/21/2010  T:  07/21/2010  Job:  [id number]  cc:   Georgina Quint. Plotnikov, MD [address] [place] [address]  Electronically Signed by Pearson Grippe MD on 08/17/2010  01:44:21 AM

## 2010-08-17 NOTE — H&P (Signed)
  [name], [name]          ACCOUNT NO.:  [id number]  MEDICAL RECORD NO.:  [id number]           PATIENT TYPE:  I  LOCATION:  1423                         FACILITY:  [facility]  PHYSICIAN:  Massie Maroon, MD        DATE OF BIRTH:  [birth year]  DATE OF ADMISSION:  07/21/2010 DATE OF DISCHARGE:                             HISTORY & PHYSICAL   ADDENDUM:  ASSESSMENT AND PLAN:  Urinary tract infection:  Discontinue trimethoprim.  The patient will be placed on ceftriaxone 1 g IV daily and we will tailor antibiotics according to urinary culture.     Massie Maroon, MD     JYK/MEDQ  D:  07/21/2010  T:  07/21/2010  Job:  [id number]  Electronically Signed by Pearson Grippe MD on 08/17/2010 01:43:54 AM

## 2010-08-18 ENCOUNTER — Ambulatory Visit (INDEPENDENT_AMBULATORY_CARE_PROVIDER_SITE_OTHER): Payer: Medicare Other | Admitting: Internal Medicine

## 2010-08-18 ENCOUNTER — Encounter: Payer: Self-pay | Admitting: Internal Medicine

## 2010-08-18 DIAGNOSIS — E785 Hyperlipidemia, unspecified: Secondary | ICD-10-CM

## 2010-08-18 DIAGNOSIS — R55 Syncope and collapse: Secondary | ICD-10-CM

## 2010-08-18 DIAGNOSIS — F329 Major depressive disorder, single episode, unspecified: Secondary | ICD-10-CM

## 2010-08-18 DIAGNOSIS — K219 Gastro-esophageal reflux disease without esophagitis: Secondary | ICD-10-CM

## 2010-08-18 DIAGNOSIS — R109 Unspecified abdominal pain: Secondary | ICD-10-CM

## 2010-08-18 MED ORDER — LORAZEPAM 1 MG PO TABS: 1.0000 mg | ORAL_TABLET | Freq: Three times a day (TID) | ORAL | Status: DC | PRN

## 2010-08-18 MED ORDER — NITROGLYCERIN 0.4 MG SL SUBL: 0.4000 mg | SUBLINGUAL_TABLET | SUBLINGUAL | Status: DC | PRN

## 2010-08-18 MED ORDER — PROMETHAZINE HCL 25 MG PO TABS: 25.0000 mg | ORAL_TABLET | Freq: Four times a day (QID) | ORAL | Status: DC | PRN

## 2010-08-18 MED ORDER — METHADONE HCL 10 MG PO TABS: 10.0000 mg | ORAL_TABLET | Freq: Every day | ORAL | Status: DC | PRN

## 2010-08-18 MED ORDER — TRIMETHOPRIM 100 MG PO TABS: 100.0000 mg | ORAL_TABLET | Freq: Every day | ORAL | Status: DC

## 2010-08-18 NOTE — Assessment & Plan Note (Signed)
On Rx 

## 2010-08-18 NOTE — Assessment & Plan Note (Signed)
Cont Rx 

## 2010-08-18 NOTE — Progress Notes (Signed)
  Subjective:    Patient ID: [name], female    DOB: 12/30/1927, 77 y.o.   MRN: [id number]  HPI   The patient is here to follow up on chronic depression, anxiety, abd pain, breast cancer and chronic mild angina symptoms controlled with medicines, diet.   Review of Systems  Constitutional: Negative for activity change and fatigue.  HENT: Negative for neck pain.   Eyes: Negative for redness.  Respiratory: Negative for cough.   Cardiovascular: Negative for leg swelling.  Gastrointestinal: Positive for nausea and abdominal pain. Negative for abdominal distention.  Genitourinary: Positive for pelvic pain. Negative for dysuria.  Musculoskeletal: Negative for gait problem.  Psychiatric/Behavioral: Negative for agitation.       Objective:   Physical Exam  Constitutional: No distress.  Eyes: Left eye exhibits no discharge.  Neck: No JVD present.  Pulmonary/Chest: She has no wheezes.  Genitourinary: Guaiac negative stool.  Musculoskeletal: She exhibits no edema.  Neurological: She displays normal reflexes. No cranial nerve deficit.  Skin: No erythema. No pallor.          Assessment & Plan:  ABDOMINAL PAIN, CHRONIC  Cont Rx as before.  Potential benefits of a long term opioids use as well as potential risks (i.e. addiction risk, apnea etc) and complications (i.e. Somnolence, constipation and others) were explained to the patient and were aknowledged. It is a very complex case. Her chart, data from recent tests and hosp. Stay were reviewed. Meds refilled.  Potential benefits of a long term opioids use as well as potential risks (i.e. addiction risk, apnea etc) and complications (i.e. Somnolence, constipation and others) were explained to the patient and were aknowledged.   DEPRESSION Cont Rx  GERD On Rx  HYPERLIPIDEMIA Cont Rx  SYNCOPE No recurrence lately

## 2010-08-18 NOTE — Assessment & Plan Note (Signed)
Cont Rx as before.  Potential benefits of a long term opioids use as well as potential risks (i.e. addiction risk, apnea etc) and complications (i.e. Somnolence, constipation and others) were explained to the patient and were aknowledged.

## 2010-08-18 NOTE — Assessment & Plan Note (Signed)
No recurrence lately

## 2010-08-20 ENCOUNTER — Encounter: Payer: Self-pay | Admitting: Internal Medicine

## 2010-08-22 ENCOUNTER — Encounter: Payer: Medicare Other | Admitting: Cardiology

## 2010-08-22 ENCOUNTER — Telehealth: Payer: Self-pay | Admitting: Internal Medicine

## 2010-08-22 ENCOUNTER — Other Ambulatory Visit: Payer: Self-pay | Admitting: General Surgery

## 2010-08-22 DIAGNOSIS — Z09 Encounter for follow-up examination after completed treatment for conditions other than malignant neoplasm: Secondary | ICD-10-CM

## 2010-08-22 NOTE — Telephone Encounter (Signed)
PA requested for Promethazine 25mg  08/18/10 Requested PA form from [facility] @ [phone] 08/18/10 PA form received & forwarded to MD for completion 08/19/10 Completed form faxed for Approval 08/21/10  Received fax from [place] on 08/22/10 stating that Pt does not have [employer] insurance.? Contacted [facility] to inquire about this & check if Pt had received Rx. Informed by pharmacist that this RX has been filled and p/u by Pt.??  Ended PA effort.

## 2010-08-24 ENCOUNTER — Encounter: Payer: Self-pay | Admitting: Cardiology

## 2010-08-24 DIAGNOSIS — IMO0002 Reserved for concepts with insufficient information to code with codable children: Secondary | ICD-10-CM | POA: Insufficient documentation

## 2010-08-24 DIAGNOSIS — I2510 Atherosclerotic heart disease of native coronary artery without angina pectoris: Secondary | ICD-10-CM | POA: Insufficient documentation

## 2010-08-24 DIAGNOSIS — Z5189 Encounter for other specified aftercare: Secondary | ICD-10-CM

## 2010-08-24 DIAGNOSIS — I739 Peripheral vascular disease, unspecified: Secondary | ICD-10-CM

## 2010-08-24 DIAGNOSIS — N390 Urinary tract infection, site not specified: Secondary | ICD-10-CM

## 2010-08-24 DIAGNOSIS — Z951 Presence of aortocoronary bypass graft: Secondary | ICD-10-CM | POA: Insufficient documentation

## 2010-08-24 DIAGNOSIS — I779 Disorder of arteries and arterioles, unspecified: Secondary | ICD-10-CM | POA: Insufficient documentation

## 2010-08-24 DIAGNOSIS — M6281 Muscle weakness (generalized): Secondary | ICD-10-CM

## 2010-08-24 DIAGNOSIS — R269 Unspecified abnormalities of gait and mobility: Secondary | ICD-10-CM

## 2010-08-24 DIAGNOSIS — R9430 Abnormal result of cardiovascular function study, unspecified: Secondary | ICD-10-CM | POA: Insufficient documentation

## 2010-08-25 ENCOUNTER — Encounter: Payer: Self-pay | Admitting: Cardiology

## 2010-08-25 ENCOUNTER — Ambulatory Visit (INDEPENDENT_AMBULATORY_CARE_PROVIDER_SITE_OTHER): Payer: Medicare Other | Admitting: Cardiology

## 2010-08-25 DIAGNOSIS — R609 Edema, unspecified: Secondary | ICD-10-CM

## 2010-08-25 DIAGNOSIS — R0789 Other chest pain: Secondary | ICD-10-CM

## 2010-08-25 DIAGNOSIS — R55 Syncope and collapse: Secondary | ICD-10-CM

## 2010-08-25 MED ORDER — INDAPAMIDE 2.5 MG PO TABS: ORAL_TABLET | ORAL | Status: DC

## 2010-08-25 NOTE — Assessment & Plan Note (Signed)
The patient does have trace peripheral edema.  Hesitant to diuresis her significantly.  Her husband likes to use indapamide.  I will allow her to receive this on a limited basis.

## 2010-08-25 NOTE — Progress Notes (Signed)
HPI Patient is seen today for cardiology followup.  She has had some chest pain.  She has been admitted to the hospital on 2 occasions by the primary care team.  There's been no evidence of myocardial ischemia.  She has had some trace peripheral edema. Allergies  Allergen Reactions  . Baclofen     REACTION: confusion  . Belladonna   . Ciprofloxacin   . Doxycycline   . Erythromycin   . Gabapentin   . Iohexol      Code: RASH   . Metronidazole     REACTION: rash  . Nalbuphine   . Nitrofurantoin     REACTION: nausea  . Penicillins   . Procaine Hcl   . Sucralfate     REACTION: nausea  . Sulfonamide Derivatives     Current Outpatient Prescriptions  Medication Sig Dispense Refill  . conjugated estrogens (PREMARIN) vaginal cream Place 1 g vaginally 2 (two) times a week.        . diphenhydrAMINE (BENADRYL) 25 mg capsule TAKE 1-2 CAPSULES BY MOUTH FOUR TIMES DAILY AS NEEDED FOR ALLERGIES  60 capsule  1  . Lactulose 20 GM/30ML SOLN Take 30-60 mLs by mouth as needed.        Marland Kitchen LORazepam (ATIVAN) 1 MG tablet Take 1 tablet (1 mg total) by mouth every 8 (eight) hours as needed.  90 tablet  5  . methadone (DOLOPHINE) 10 MG tablet Take 1 tablet (10 mg total) by mouth 5 (five) times daily as needed. Fill on or after 09/19/10  150 tablet  0  . metoCLOPramide (REGLAN) 5 MG tablet TAKE 1 TABLET BY MOUTH 3 TIMES A DAY BEFORE MEALS  90 tablet  2  . metoprolol succinate (TOPROL-XL) 25 MG 24 hr tablet Take 25 mg by mouth 2 (two) times daily.        . nitroGLYCERIN (NITROSTAT) 0.4 MG SL tablet Place 1 tablet (0.4 mg total) under the tongue as needed.  20 tablet  6  . omeprazole (PRILOSEC) 20 MG capsule Take 20 mg by mouth 2 (two) times daily.        . potassium chloride (KLOR-CON) 10 MEQ CR tablet Take 10 mEq by mouth daily.       . promethazine (PHENERGAN) 25 MG tablet Take 1-2 tablets (25-50 mg total) by mouth 4 (four) times daily as needed. For nausea  60 tablet  5  . triamcinolone (KENALOG) 0.1 %  ointment Apply topically 2 (two) times daily.        Marland Kitchen trimethoprim (TRIMPEX) 100 MG tablet Take 1 tablet (100 mg total) by mouth daily.  30 tablet  11  . Vitamin D, Ergocalciferol, (DRISDOL) 50000 UNITS CAPS Take 50,000 Units by mouth every 30 (thirty) days.        Marland Kitchen zolpidem (AMBIEN) 10 MG tablet Take 10 mg by mouth at bedtime as needed.        Marland Kitchen DISCONTD: divalproex (DEPAKOTE) 250 MG EC tablet Take 250 mg by mouth 2 (two) times daily.        Marland Kitchen DISCONTD: aspirin 81 MG EC tablet Take 81 mg by mouth daily.        Marland Kitchen DISCONTD: lubiprostone (AMITIZA) 24 MCG capsule Take 24-48 mcg by mouth daily.        Marland Kitchen DISCONTD: Oxybutynin Chloride (GELNIQUE) 10 % GEL Place onto the skin daily.          History   Social History  . Marital Status: Married    Spouse Name:  N/A    Number of Children: N/A  . Years of Education: N/A   Occupational History  . RETIRED MD    Social History Main Topics  . Smoking status: Never Smoker   . Smokeless tobacco: Not on file  . Alcohol Use: No  . Drug Use: No  . Sexually Active: No   Other Topics Concern  . Not on file   Social History Narrative   Patient is an immigrant from [place]. 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 Currently living w/her husband.     Family History  Problem Relation Age of Onset  . Coronary artery disease      Female 1st degree relative <60  . Colon cancer Mother   . Arthritis Mother   . Heart disease Mother   . Heart disease Father     Past Medical History  Diagnosis Date  . CAD (coronary artery disease)     Catheterization, January, 2007 patent grafts / hospitalization October 2 010 no MI  . Hx of CABG   . Syncope 03/2009    ?orthostasis  . Allergic rhinitis   . History of colon polyps   . Diverticulosis of colon   . GERD (gastroesophageal reflux disease)   . Hyperlipidemia   . Abdominal  pain, other specified site     motility disorder, chorinic functional,  severe (Dr Juanda Chance). Possible OCD w/complusive use of enemas and laxatives; psychotic features 2011  . Positive H. pylori test 2911  . Psychosomatic disorder     w/GI fixation  . Anxiety   . Depression   . Stress incontinence, female   . Vitamin D deficiency   . Vitamin B12 deficiency   . Renal insufficiency   . UTI (urinary tract infection)   . Hypokalemia     mild  . Insomnia   . Breast cancer 2011    Dr Welton Flakes  . Carotid artery disease     Doppler November, 9147, 0-39% LICA, 60-79% R. ICA.Marland Kitchen increased velocity... possibly from serpentine vessel  . Ejection fraction     50-55%, no wall motion abnormalities, echo, November, 2010    Past Surgical History  Procedure Date  . Coronary artery bypass graft   . Myomectomy     Uterine and ovarian  . Breast fibroadenoma surgery   . Hemicolectomy 2005    Left  . Breast lumpectomy 2011    ROS  The history is taken through the [name] interpreter.  Patient denies fever, chills, headache, sweats, rash, change in vision, change in hearing, cough, nausea vomiting.  All other systems are reviewed and are negative. PHYSICAL EXAM Patient is stable today.  No xanthelasma.  Lungs are clear.  Respiratory effort is nonlabored.  Cardiac exam reveals no clonus.  There no clicks or significant murmurs.  The abdomen is soft.  There is trace peripheral edema. Filed Vitals:   08/25/10 1057  BP: 138/78  Pulse: 83  Height: 5\' 5"  (1.651 m)  Weight: 145 lb (65.772 kg)    EKG  No EKG is done today.  ASSESSMENT & PLAN

## 2010-08-25 NOTE — Patient Instructions (Signed)
Your physician recommends that you schedule a follow-up appointment in: 6 months with Dr. [name]. Your physician has recommended you make the following change in your medication: new medication Indapamide 1.25 mg take as directed.

## 2010-08-25 NOTE — Assessment & Plan Note (Signed)
Since her last visit with me the patient has had one episode of syncope.  This may have been related to her taking 3 nitroglycerin.  I have encouraged her to try not to take her nitroglycerin unless she is having severe chest pain.  If that is the case she should take only one.

## 2010-08-25 NOTE — Assessment & Plan Note (Signed)
At this point I feel her chest pain is not cardiac.  No further workup.

## 2010-09-01 ENCOUNTER — Encounter (INDEPENDENT_AMBULATORY_CARE_PROVIDER_SITE_OTHER): Payer: Self-pay | Admitting: General Surgery

## 2010-09-06 ENCOUNTER — Telehealth: Payer: Self-pay | Admitting: *Deleted

## 2010-09-06 NOTE — Telephone Encounter (Signed)
rf req for Lorazepam 1 mg 1 bid-qid. #120. Last filled 08/07/10.... Ok to Rf in AVP's absence?

## 2010-09-06 NOTE — Telephone Encounter (Signed)
I tried to call pt to see if she has her medication and spoke to husband. He is unable to understand me so I advised him I will talk to Dr. Posey Rea when he returns tomorrow.  Dr. Posey Rea, Did pt rec a written Rx at 08-18-10 OV?

## 2010-09-06 NOTE — Telephone Encounter (Signed)
No , since last rx was for #90 for tid prn use on April 13

## 2010-09-06 NOTE — Telephone Encounter (Signed)
I spoke to pt's daughter. She states the pt has the medication and to disregard rf req

## 2010-09-19 NOTE — Assessment & Plan Note (Signed)
[facility]                            CARDIOLOGY OFFICE NOTE   [name], [name]                 MRN:          [id number]  DATE:09/24/2007                            DOB:          [birth year]    [name] is actually doing well.  She is a [name] immigrant and  is here with an interpreter.  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  She has known coronary disease.  I took over care from Dr.  Samule Ohm.  I saw her as a new patient to my practice on March 31, 2007.  The patient had some mild shortness of breath.  She is not having  significant chest pain.  She does seem to have a history of significant  GI pain.  She tells me that she is on methadone.  Sometimes when she has  abdominal pain, she may feel like she is going to have presyncope.  She  actually carries ammonia capsules which she sniffs from time to time.  Also, her blood pressure can vary during the day.  When it is on the  lower side, she may feel like his she has some presyncope and again uses  her ammonia.   PAST MEDICAL HISTORY:   ALLERGIES:  See the extensive list listed in the chart.   MEDICATIONS:  Indapamide, hydroxyzine, aspirin, trimethoprim,  promethazine, nitroglycerin patch, metoprolol 25, omeprazole, potassium,  Anusol, cyanocobalamin.   OTHER MEDICAL PROBLEMS:  See the list on the prior note.   REVIEW OF SYSTEMS:  Other than the HPI, the review of systems is  negative.   PHYSICAL EXAMINATION:  VITAL SIGNS:  Blood pressure is 145/78.  Her  pulse is 63.  All of the communication is by speaking to the patient and  the interpreter.  This patient speaks no [place].  LUNGS:  Clear.  Respiratory effort is not labored.  CARDIAC:  Exam reveals an S1 with an S2.  There are no clicks or  significant murmurs.  ABDOMEN:  Soft.  EXTREMITIES:  She has no peripheral edema.   PROBLEMS:  1. Coronary artery disease status post coronary artery bypass grafting      in the past.  She is stable overall.  See the history in my note of      March 31, 2007.  She does not tolerate statins.  2. History of abdominal pain and methadone use.  Other medical      problems per Dr. Posey Rea.  3. Feeling of presyncope for which she uses her own ammonia capsules.      It is very difficult to assess this situation.  I have suggested on      days that her blood pressure is on the lower      side that she take off her nitroglycerin patch and not place it      back until the next day.  She understands this through the      interpreter.  No other change in her therapy.     Luis Abed, MD, [facility]  Electronically Signed    JDK/MedQ  DD: 09/24/2007  DT: 09/24/2007  Job #: [id number]   cc:   Georgina Quint. Plotnikov, MD

## 2010-09-19 NOTE — Discharge Summary (Signed)
[name], NYLANDER NO.:  [id number]   MEDICAL RECORD NO.:  [id number]          PATIENT TYPE:  OBV   LOCATION:  4708                         FACILITY:  [facility]   PHYSICIAN:  Jonelle Sidle, MD DATE OF BIRTH:  [birth year]   DATE OF ADMISSION:  08/06/2008  DATE OF DISCHARGE:  08/07/2008                               DISCHARGE SUMMARY   PRIMARY CARDIOLOGIST:  Luis Abed, MD, [facility]   PRIMARY CARE PHYSICIAN:  Georgina Quint. Plotnikov, MD   DISCHARGE DIAGNOSIS:  Chest pain syndrome.   SECONDARY DIAGNOSES:  1. Coronary artery disease (history of inferior myocardial infarction,      2004, status post balloon angioplasty of the right coronary artery      immediately followed by coronary artery bypass graft.  Repeat      cardiac cath on May 16, 2005, showed patent grafts, no      interventions).  2. Hypertension.  3. Hyperlipidemia.  4. Diverticulosis.  5. Hiatal hernia.   ALLERGIES:  1. CIPROFLOXACIN  2. DOXYCYCLINE.  3. CONTRAST DYE.  4. LIDOCAINE.  5. NOVOCAIN.  6. NUBAIN.  7. PENICILLIN.  8. SULFA MEDS.   PROCEDURES PERFORMED DURING THIS HOSPITALIZATION:  1. EKG performed on August 06, 2008, that showed sinus rhythm at 60 bpm,      minimal T-wave inversion in lead V3 and T-wave flattening in V4      through V6, Q-waves in II, III, and aVF.  Normal axis.  No evidence      of hypertrophy.  Intervals within normal limits.  2. The patient had chest x-ray on August 06, 2008 that showed;      a.     Low lung volumes with asymmetric patchy opacity at the left       lung base.  Findings may represent atelectasis/airspace disease.      b.     Probable small left pleural effusion.   HISTORY OF PRESENT ILLNESS:  This is a 77 year old lady with a history  of CAD s/p inferior wall MI in 2004, with which she had a PTCA to the  RCA, immediately followed by CABG.  She has been followed by Dr. [name]  and last cath in January 2007, showed patent grafts and no  intervention  was done at that time.  She presents today with worsening chest pain.  She has baseline stable angina for which she has used nitroglycerin  approximately 2 tablets per month, however, recently she has been using  5-6 nitroglycerin tablets per month for exertional chest pain.  Today,  she was at rest and had an atypical sharp pain that was not associated  with shortness of breath or diaphoresis, and was not relieved with  nitroglycerin.  As it persisted, she came to the ED for evaluation.  The  total duration of this sharp pain lasts approximately 30 minutes, but  now is currently subsided.   HOSPITAL COURSE:  The patient was admitted as above.  Vital signs  stable, heart rate has been bradycardic, but stable at 49 to 60 bpm.  The patient had 1 set of point-of-care markers, which were  negative and  2 full sets of cardiac enzymes, which were also negative.  On the  morning of August 07, 2008, the patient is totally asymptomatic and  anxious to return home.  As there is currently no objective evidence of  a high risk acute coronary syndrome, the patient will be discharged and  [facility] be contacted, so that Dr. [name] can see the  patient as soon as possible for a followup visit.  No outpatient Myoview  will be scheduled at this time pending her follow-up assessment.  She  and her husband state that she has had trouble with prior stress tests  (details not clear). At the time of discharge, the patient received her  medication list, which was unchanged as well as her followup instruction  sheet in both oral and written form and at that time, she had no  questions or concerns that were not addressed.  Per rounding note on day  of discharge, Dr. Diona Browner suggested low-dose Norvasc as an antianginal  if symptoms continue as well as mentioning the possibility for relook  cardiac catheterization if symptoms persists despite good medical  therapy.  Also, importance of a  follow up chest x-ray secondary to  abnormal findings should be considered.   DISCHARGE LABORATORIES:  Total cholesterol 367, triglycerides 153, HDL  48, LDL 288, and VLDL 31.  PT 16.1 and INR 1.0 (please see history and  physical for admission lab results).   FOLLOWUP PLANS AND APPOINTMENTS:  Please see hospital course.   DISCHARGE MEDICATIONS:  1. Methadone 10 mg p.o. q.i.d.  2. Ativan 1 mg p.o. p.r.n.  3. Nitroglycerin 0.4 mg sublingually p.r.n. for chest pain.  4. Hydroxyzine 25 mg p.o. t.i.d. p.r.n.  5. Nitro-Dur 0.6 mg apply daily.  6. Toprol-XL 25 mg p.o. daily.  7. Trimethoprim 100 mg p.o. daily.  8. Indapamide 2.5 mg p.o. daily.  9. Aspirin 81 mg p.o. daily.  10.Ambien 10 mg p.o. nightly.   DURATION OF DISCHARGE/ENCOUNTER INCLUDING PHYSICIAN TIME:  35 minutes.      [name], [facility]      Jonelle Sidle, MD  Electronically Signed    MS/MEDQ  D:  08/07/2008  T:  08/07/2008  Job:  [id number]   cc:   Luis Abed, MD, [facility]  Georgina Quint Plotnikov, MD

## 2010-09-19 NOTE — Assessment & Plan Note (Signed)
[facility]                            CARDIOLOGY OFFICE NOTE   CIANI, RUTTEN                 MRN:          [id number]  DATE:03/24/2008                            DOB:          May 30, 1927    Ms. Demas is doing well.  She has known coronary artery disease.  This is outlined in my prior notes.  She has rare intermittent chest  discomfort.  However, she has been stable with this.  She is not having  any syncope or presyncope.  Previously, her blood pressure was on the  low side and I encouraged her to not use her nitroglycerin patch on the  day that she felt low blood pressure.  This has helped.  At one point,  she was using some type of ammonia capsule for presyncope.  She is not  doing this anymore.  I believe that we have solved this problem by not  lowering her pressure too much.   PAST MEDICAL HISTORY:   ALLERGIES:  See the prior extensive list.   MEDICATIONS:  See the flow sheet.   REVIEW OF SYSTEMS:  She is not having any GI or GU symptoms.  She has no  fevers or chills or skin rashes.  Her review of systems, otherwise is  negative.   PHYSICAL EXAMINATION:  VITAL SIGNS:  Blood pressure 118/70 with a pulse  of 59.  GENERAL:  The patient is oriented to person, time, and place.  Affect is  normal.  HEENT:  No xanthelasma.  She has normal extraocular motion.  There are  no carotid bruits.  There is no jugular venous distention.  LUNGS:  Clear.  Respiratory effort is not labored.  CARDIAC:  S1 with an S2.  There are no clicks or significant murmurs.  ABDOMEN:  Soft.  She has no peripheral edema.   EKG is reviewed.  There is no significant change.   PROBLEMS:  Are listed on the prior notes.  She is post coronary artery  bypass graft in the past.  Her data is reviewed in my note of March 31, 2007.  She is doing well.  No change in her meds.  I will see her in  1 year.     Luis Abed, MD, [facility]  Electronically  Signed    JDK/MedQ  DD: 03/24/2008  DT: 03/24/2008  Job #: [id number]   cc:   Georgina Quint. Plotnikov, MD

## 2010-09-19 NOTE — Assessment & Plan Note (Signed)
[facility]                            CARDIOLOGY OFFICE NOTE   Sandy, Young                 MRN:          [id number]  DATE:03/31/2007                            DOB:          1927-10-01    [name] is seen for cardiology followup.  She had been followed  by Dr. Samule Ohm who has moved to [facility], and I will take over her care  and her husband's care.  She is of [name] descent and speaks only  [name].  She is here with an interpreter today.  She is a very pleasant  lady.  She does have known coronary disease.  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  She had an inferior myocardial infarction in 2004 and her  right coronary artery was opened emergently by Dr. Samule Ohm, and then the  patient had coronary artery bypass grafting.  Her ejection fraction is  normal.  She has had some ongoing chest discomfort.  However, it is  actually stable, and there is no major change.  The patient had a  nuclear scan in May of 2007.  The ejection fraction was 64% and there  was no obvious ischemia.  This was done with adenosine.  She does have  some shortness of breath.  It is primarily with exertion.  There is no  PND or orthopnea.  Overall, she continues to be slightly better over  time since her coronary artery bypass graft.  She has other chronic  pain.  She is stable.  She is in the room with her interpreter and her  husband at this time.   PAST MEDICAL HISTORY:   ALLERGIES:  The patient has multiple allergies, including allergies to  STATINS.  Allergies include PENICILLIN, SULFA, ERYTHROMYCIN, NOVOCAIN,  CIPRO, LIDOCAINE, POTASSIUM, question LEVOTHYROXINE, IV CONTRAST,  DOXYCYCLINE, and possibly others.   MEDICATIONS:  1. Indapamide 25 mg.  2. Lorazepam.  3. Hydroxyzine.  4. Aspirin.  5. Trimethoprim.  6. Promethazine.  7. Nitroglycerin patch.  8. Metoprolol 25 daily.  9. Methadone.   OTHER MEDICAL PROBLEMS:  See the list below.   REVIEW OF SYSTEMS:  Today she has no major complaint.  As mentioned in  the history of present illness, she does have some shortness of breath,  otherwise her review of systems is negative.   PHYSICAL EXAMINATION:  The patient is overweight at 178 pounds.  Blood  pressure is 115/68 with a pulse of 61.  The patient is oriented to person, time, and place.  Her affect is  normal.  HEENT:  Reveals no xanthelasma.  She has normal extraocular  motion.  There are no carotid bruits.  There is no jugular venous distension.  LUNGS:  Clear.  Respiratory effort is not labored.  Her surgical scar from 2004 in her anterior chest is nicely healed.  CARDIAC EXAM:  Reveals an S1 with an S2.  There are no clicks or  significant murmurs.  The abdomen is obese but soft.  She has normal bowel sounds.  There is no peripheral edema.  There are no major musculoskeletal deformities.   Labs had been checked  in October of 2008, and her hemoglobin and renal  function are stable.   EKG today reveals sinus bradycardia with prior inferior infarct.  She  also has some anterior T-wave changes.  These have been present in the  past.   PROBLEMS:  1. Coronary disease, status post coronary artery bypass graft.  There      is some residual chest pain and shortness of breath, but, in fact,      this is better over time and she is stable.  I will not change her      medicines.  As noted, she has allergies to multiple meds.  I think      it will be most prudent to change as few medicines as possible.      Unfortunately, she cannot tolerate STATINS.  2. Other medical problems are per Dr. Posey Rea.     Luis Abed, MD, [facility]  Electronically Signed    JDK/MedQ  DD: 03/31/2007  DT: 03/31/2007  Job #: [id number]

## 2010-09-19 NOTE — H&P (Signed)
[name], [name]          ACCOUNT NO.:  [id number]   MEDICAL RECORD NO.:  [id number]          PATIENT TYPE:  OBV   LOCATION:  4708                         FACILITY:  [facility]   PHYSICIAN:  Jennelle Human. Marisue Humble, MD DATE OF BIRTH:  November 13, 1927   DATE OF ADMISSION:  08/06/2008  DATE OF DISCHARGE:                              HISTORY & PHYSICAL   PRIMARY CARDIOLOGIST:  Luis Abed, MD, [facility].   PRIMARY PHYSICIAN:  Georgina Quint. Plotnikov, MD   CHIEF COMPLAINT:  Chest pain.   HISTORY OF PRESENT ILLNESS:  This is a 77 year old lady with a history  of coronary artery disease, status post inferior wall MI in 2004, with  which she had a POBA to the RCA immediately followed by coronary artery  bypass grafting.  She has been following up with Dr. [name], and her last  cath and January 2007, showed patent grafts with no interventions done  at that time.   She presents today with worsening chest pain.  She has a baseline stable  angina, for which she has used nitroglycerin with exertion approximately  2 tablets per month, however, recently she has been using 5-6  nitroglycerin per month for exertional chest pain.  Today, she was at  rest and had an atypical sharp pain that was not associated with  shortness of breath or diaphoresis, and was not relieved with  nitroglycerin.  As it persisted, she came to the emergency department  for further evaluation.  The total duration of this sharp pain lasted  for approximately 30 minutes, but has now currently subsided.   PAST MEDICAL HISTORY:  1. Coronary artery disease, history of inferior MI in 2004, status      post POBA of the RCA immediately followed by coronary artery bypass      grafting.  Repeat cardiac catheterization on May 16, 2005,      showed patent grafts, no interventions.  She has been followed by      Dr. [name] for stable angina.  2. Hypertension.  3. Hyperlipidemia.  4. Diverticulosis.  5. Hiatal hernia.  6. History of  adrenal adenoma.  7. Multiple drug intolerances.  8. Fibroadenomatous disease of the breast.  9. History of cervical polyps.  10.History of uterine fibroids.   SOCIAL HISTORY:  The patient is an immigrant from [place].  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  She does not smoke or use alcohol, and is  currently living with her husband.   FAMILY HISTORY:  Noncontributory.  The patient with known coronary  disease.   REVIEW OF SYSTEMS:  Negative x10 except for that stated in HPI.   ALLERGIES:  Multiple and include:  1. CIPRO.  2. DOXYCYCLINE.  3. CONTRAST DYE.  4. LIDOCAINE.  5. NOVOCAINE.  6. NUBAIN.  7. PENICILLIN.  8. SULFA MEDS.   MEDICATIONS:  1. Methadone 10 mg q.i.d.  2. Ativan 1 mg p.r.n.  3. Nitroglycerin 0.4 mg p.r.n.  4. Hydroxyzine 25 mg t.i.d. p.r.n.  5. Nitro-Dur patch 0.6 mg per hour one patch daily.  6.  Toprol-XL 25 mg daily.  7. Trimethoprim 100 mg daily.  8. Indapamide 2.5 mg tablets 1 tablet daily.  9. Aspirin 81 mg daily.  10.Ambien 10 mg at bedtime.   PHYSICAL EXAMINATION:  VITAL SIGNS:  She is afebrile.  Pulse is 54,  respiratory rate is 16.  GENERAL:  Alert, awake, and oriented, in no acute distress.  HEENT:  Normal.  NECK:  Supple.  No lymphadenopathy.  No JVP elevation is appreciated.  Carotid upstrokes are normal.  No bruits.  Lymphadenopathy, none.  CARDIOVASCULAR:  Regular rate and rhythm.  S1 and S2 are normal.  There  is 2/6 systolic ejection murmur heard best at the left upper sternal  border.  LUNGS:  Clear to auscultation bilaterally.  ABDOMEN:  Soft, nondistended, and nontender.  Good bowel sounds.  No  hepatosplenomegaly.  EXTREMITIES:  No clubbing, cyanosis, or edema.  Dorsalis pedis and  posterior tibial pulses are normal.   Chest x-ray shows no evidence of pulmonary edema.  ECG is normal sinus  rhythm.  There is nonspecific T-wave flattening and evidence of an  old  inferior MI.  No significant changes consistent with ischemia.   LABORATORIES:  First set of cardiac enzymes are negative.  White count  is 4.5, H and H is 11.8 and 35.8, and platelets 192.  Sodium slightly  low at 129, potassium 3.7, chloride 92, bicarb 30, BUN 15, and  creatinine 1.47.  Platelets 106.   ASSESSMENT AND PLAN:  1. Chest pain.  This patient has a history of bypass surgery back in      2004, after an inferior ST-elevation myocardial infarction with      which they did a plain old balloon angioplasty to the right      coronary artery followed by the coronary artery bypass grafting.      She has a baseline exertional angina that she uses approximately 2      nitroglycerin per month for her exertional angina.  Recently, she      has been using 5 or 6 nitroglycerin a month for increased      exertional angina.  Today, she had some rest pain thus sound      somewhat atypical and the fact that it was sharp; had no associated      nausea, diaphoresis or shortness of breath and was unrelieved with      nitroglycerin.  It lasted for a total of 30 minutes.  She is      adamant about leaving; however, I asked her to stay overnight to      rule out myocardial infarction in the setting of accelerating      angina.  If her enzymes are negative, I think it is reasonable to      obtain adenosine myocardial perfusion study as an outpatient with      close follow up with Dr. [name].  If she rules out, I hope that she      may go in the morning.  2. Hypertension.  We will continue current medical management.  3. Hyperlipidemia.  It looks like, there was an LDL done in November      1610, that was over 200, yet she is not on a statin.  There is some      language difficulties that I cannot understand why she is not on a      statin, however, it does not look it as one of her intolerances,  although I will let this be addressed by Dr. [name] as he probably      knows the reason.  4.  Chronic pain.  We will continue her methadone during her stay.  5. Follow up.  Hopefully, she will rule out for myocardial infarction      and can be seen early in the morning and have a myocardial      perfusion study done as an outpatient with close follow up by Dr.      [name].      Jennelle Human Marisue Humble, MD  Electronically Signed     GBS/MEDQ  D:  08/06/2008  T:  08/07/2008  Job:  [id number]

## 2010-09-19 NOTE — Op Note (Signed)
[name], [name]          ACCOUNT NO.:  [id number]   MEDICAL RECORD NO.:  [id number]          PATIENT TYPE:  INP   LOCATION:  4732                         FACILITY:  [facility]   PHYSICIAN:  Valerie A. Felicity Young, MDDATE OF BIRTH:  May 28, 1927   DATE OF PROCEDURE:  DATE OF DISCHARGE:  12/06/2008                               OPERATIVE REPORT   PRIMARY CARE PHYSICIAN:  Georgina Quint. Plotnikov, MD   UROLOGIST:  Maretta Bees. Vonita Moss, MD   DISCHARGE DIAGNOSES:  1. Altered mental status, multifactorial in nature resolved at      discharge.  2. Urinary retention.  3. Hyponatremia.  4. Hypokalemia.  5. Chronic abdominal pain.   HISTORY OF PRESENT ILLNESS:  [name] is a 77 year old [name]  female with history of chronic abdominal pain, hyponatremia, and  coronary artery disease who presented to [facility] Emergency Department  on day admission with reports per family of confusion.  Husband also  noticed prior to admission, that patient was unable to void prior to  this admission.  Upon admission evaluation, the patient found to be  afebrile with stable vital signs and asymptomatic bradycardia.  However,  Foley cath placed during emergency room stay yielded approximately 2 L  of urine.  The patient was admitted at that time for further evaluation  and treatment.   PAST MEDICAL HISTORY:  1. Hypertension.  2. Chronic abdominal pain on methadone.  3. Diverticulitis.  4. Status post partial bowel resection.  5. GERD.  6. Hyponatremia.  7. Coronary artery disease, status post MI.  8. Status post CABG in 2005.   CONSULTATION DURING THIS ADMISSION:  [facility], Dr. Larey Dresser.   PROCEDURE DURING THIS HOSPITALIZATION:  1. Chest x-ray done on December 04, 2008, with no active disease.  2. 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   COURSE OF HOSPITALIZATION:  1. Altered mental status.  Again resolved at the time discharge likely      multifactorial in nature given urinary retention as well as      medication side effects.  Husband reports giving the patient doses      of lorazepam, zolpidem, and methadone on morning of admission as      the patient was having difficulty sleeping.  The patient is      nontoxic appearing with normal white cell count and afebrile      throughout hospitalization and felt medically stable for discharge      home.  2. Urinary retention.  Question etiology.  The patient did see her      urologist Dr. Larey Dresser just prior to this admission.  Urology      was called in consultation and asked the patient be discharged home      with indwelling Foley catheter and follow up in the office for      voiding trial on December 08, 2008.  3. Hyponatremia.  The patient with history of same,  sodium on      admission 124 up to 130 on day of discharge with gentle IV fluid      hydration.  CT of the head as well as abdomen and pelvis CT are      negative for neoplasm.  Normal cortisol level in June 2010 would      consider other outpatient workup for SIADH to defer it to primary      care physician.  4. Hypokalemia.  The patient with mild hypokalemia at the time of      discharge; however, she was refusing p.o. potassium due to reported      allergy.   MEDICATIONS AT THE TIME OF DISCHARGE:  1. Flomax 0.4 mg p.o. daily, note this is a new medication.  2. Premarin 0.625 mg cream twice weekly.  3. Ibuprofen 600 mg p.o. b.i.d. p.r.n.  4. Methadone 10 mg 1-2 tablets 4 times daily p.r.n.  5. Lorazepam 1 mg p.o. b.i.d.  6. Hydroxyzine 25 mg t.i.d. p.r.n.  7. Promethazine 25 mg p.o. b.i.d. p.r.n.  8. Nitro-Dur 0.6 mg/hour daily.  9. Toprol-XL 25 mg p.o. daily.  10.Trimethoprim 100 mg p.o. daily.  11.Omeprazole 20 mg p.o. daily.  12.Indapamide 2.5 mg p.o. daily.  13.Aspirin 81 mg p.o. daily.   14.Zolpidem 10 mg p.o. nightly.   DISPOSITION:  The patient felt medically stable for discharge home at  this time.  The patient is instructed to schedule followup with her  primary care physician Dr. Macarthur Critchley Plotnikov in 1-2 weeks.  In addition,  the patient has been scheduled for followup with Dr. Larey Dresser at  [facility] on December 08, 2008 at 11:30 a.m.  The patient will be  discharged home with indwelling Foley catheter per Urology  recommendation.      [name], [name]      [name]. Felicity Coyer, MD  Electronically Signed    LE/MEDQ  D:  12/06/2008  T:  12/07/2008  Job:  [id number]   cc:   Georgina Quint. Plotnikov, MD  Maretta Bees. Vonita Moss, M.D.

## 2010-09-19 NOTE — Discharge Summary (Signed)
NAMEMCKENNAH, KRETCHMER          ACCOUNT NO.:  [id number]   MEDICAL RECORD NO.:  [id number]          PATIENT TYPE:  OBV   LOCATION:  3729                         FACILITY:  [facility]   PHYSICIAN:  Valerie A. Felicity Coyer, MDDATE OF BIRTH:  1927/05/23   DATE OF ADMISSION:  10/12/2008  DATE OF DISCHARGE:  10/13/2008                               DISCHARGE SUMMARY   DISCHARGE DIAGNOSES:  1. Hypotension prior to admission, resolved, and nonrecurrent this      hospitalization.  Continue home medications.  2. Bilateral leg pain atop chronic pain syndrome.  No evidence of deep      vein thrombosis or other musculoskeletal or neurologic deficits.      Continue outpatient evaluation and pain management as ongoing prior      to admission.  3. Chronic hyponatremia without change, status post intravenous fluid      normal saline, normal cortisol level, discharge sodium 124.  4. Hypokalemia, replaced intravenous and p.o. this hospitalization      with normalization, discharge potassium 3.5.  5. Mild chronic asymptomatic bradycardia.  6. Hypertension.  7. Dyslipidemia.  8. Gastroesophageal reflux disease with hiatal hernia.  9. Vitamin B12 deficiency.  10.Chronic kidney disease with baseline creatinine 1.4, discharge      creatinine 1.3.  11.History of coronary disease status post myocardial infarction and      coronary artery bypass graft in 2004.  Continue medical management.   Discharge medications are as prior to admission without change and  include:  1. Methadone 10 mg q.i.d. p.r.n. pain.  2. Ativan 1 mg b.i.d. p.r.n. anxiety.  3. Nitro-Dur 0.6 mg daily.  4. Toprol-XL 25 mg daily.  5. Trimethoprim 100 mg p.o. daily.  6. Aspirin 81 mg daily.  7. Ambien 10 mg at bedtime p.r.n.  8. Baclofen 10 mg b.i.d.  9. Indapamide 2.5 mg once daily.  10.Promethazine 25 mg daily p.r.n.   DISPOSITION:  The patient is discharged home with her spouse in  medically stable and improved condition.  There  have been no arrhythmias  since she has been hemodynamically __________ this hospitalization.  Hospital followup is with primary care physician, Dr. Posey Rea for next  week, Tuesday, October 19, 2008, at 3 o'clock p.m.  The patient and family  to call for problems or return to the emergency room if new issues arise  prior to that time.   HOSPITAL COURSE:  Hypotension with bilateral leg pain.  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  On the  emergency room, she was found to be hyponatremia which is chronic and  hemodynamically stable, but due to the coexisting presence of  hyponatremia, increased leg pain, and preceding hypotension, she was  referred for observation and admission.  She was placed on telemetry.  Cardiac enzymes were cycled and negative.  She was given IV fluid with  normal saline and potassium replacement which improved her potassium but  did not change her sodium levels.  She  underwent lower extremity  Dopplers to rule out DVT and this was negative.  She has remained  hemodynamically stable during this hospitalization while maintained on  all of her home medications without change.  She has had no further  symptoms and is anxious for discharge home.  Through a Russian-speaking  physician, Dr. Adela Glimpse, this information is relayed to the patient and  spouse with a good understanding.  She is felt stable for discharge home  to continue medications and treatment as ongoing prior to admission and  outpatient close followup with Dr. Posey Rea next week as described.       Valerie A. Felicity Coyer, MD  Electronically Signed     VAL/MEDQ  D:  10/13/2008  T:  10/14/2008  Job:  [id number]

## 2010-09-19 NOTE — Assessment & Plan Note (Signed)
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  She suffered an  inferior myocardial infarction in 2004.  I opened her right coronary  emergently and then sent her for a CABG for revascularization of the  remaining vessels.  Her EF is normal.   Ever since her myocardial infarction, she has been troubled by chest  discomfort that is probably chronic stable angina.  An adenosine  Cardiolite in May of 2000 did not, however, demonstrate any ischemia.  We have been managing things medically and sometimes have good control  of her symptoms and sometimes less good control.  Given her multiple  chronic pains, I have never been entirely clear that her chest pain is  anginal.   Over the past couple of months, she says she has been doing very well.  She has not needed any sublingual nitroglycerins, though she has been  using her nitroglycerin patch.  She is very happy that her third great  grandchild was born and named after her husband, Felicity Coyer.  She has not had  any syncope or pre-syncope.   CURRENT MEDICATIONS:  1. Methadone 20 mg twice daily.  2. Indapamide 25 mg daily.  3. Lorazepam 1 mg p.r.n.  4. NitroQuick p.r.n.  5. Hydroxyzine 25 mg daily.  6. Aspirin 81 mg daily.  7. Promethazine 25 mg daily.  8. Nitro Patch 0.6 mg per hour.  9. Metoprolol succinate 25 mg daily.   PHYSICAL EXAMINATION:  GENERAL:  She is generally well-appearing though  overweight, in no distress.  VITAL SIGNS:  The heart rate 59, blood pressure 112/60 and weight of 182  pounds.  She has no jugular venous distention, thyromegaly or  lymphadenopathy.  RESPIRATORY:  Effort is normal.  LUNGS:  Clear to auscultation.  CARDIAC:  She has a non-displaced point of maximal cardiac impulse.  There is a regular rate and rhythm without murmur, rub or gallop.  ABDOMEN:  Soft, nondistended, nontender.  There is no  hepatosplenomegaly.  Bowel sounds are normal.  EXTREMITIES:  Warm without clubbing, cyanosis, edema or ulceration.  NECK:  Carotid pulses are 2+ bilaterally without bruits.   Electrocardiogram demonstrates sinus bradycardia with minor nonspecific  ST-T abnormalities.   IMPRESSION/RECOMMENDATIONS:  1. Coronary disease.  Remains minimally symptomatic.  Continue      aspirin, beta blocker and topical nitrates.  2. Prior myocardial infarction.  Continue aspirin and beta blockade.  3. Followup:  Will follow up with Dr. [name] in February.     Salvadore Farber, MD  Electronically Signed    WED/MedQ  DD: 10/21/2006  DT: 10/21/2006  Job #: [id number]   cc:   Georgina Quint. Plotnikov, MD

## 2010-09-19 NOTE — H&P (Signed)
[name], [name]          ACCOUNT NO.:  [id number]   MEDICAL RECORD NO.:  [id number]          PATIENT TYPE:  EMS   LOCATION:  MAJO                         FACILITY:  [facility]   PHYSICIAN:  Donalynn Furlong, MD      DATE OF BIRTH:  11-24-1927   DATE OF ADMISSION:  12/04/2008  DATE OF DISCHARGE:                              HISTORY & PHYSICAL   PRIMARY CARE Saiya Crist:  Georgina Quint. Plotnikov, MD, with [facility]   CHIEF COMPLAINT:  Altered mental status.   HISTORY OF PRESENT ILLNESS:  Ms. Gottlieb is a 77 year old [name]  origin female.  She lives with her husband in [place].  She does not  speak [place], and she is confused at the time of encounter, so history  was obtained from her daughter.  The patient's daughter is not aware of  much of the history, either.  History was also obtained from the ER  physician's conversation with the family earlier in the ED.  The patient  started having altered mental status yesterday evening.  She was unable  to sleep, and she was confused, fidgety, nervous according to the  daughter.  Her husband lives with patient.  The patient's husband  noticed that she is unable to pass urine.  She is going to the bathroom,  but she is not able to pass urine.  He also noticed that she is  confused, unable to navigate around the house without getting any  direction.  She was unable to sleep last night.  Her husband gave her  lorazepam, zolpidem, and methadone this morning at 9:00.  She went to  sleep finally, and she was unable to wake up.  That scared the family a  lot, and they decided to bring her into the emergency department.  When  the patient's daughter arrived at her home, patient was unable to  recognize her, and she was not talking properly.  That is why daughter  was concerned too.  In the emergency department, the patient had been  sleepy and unable to wake up when the ER physician saw her, so he  obtained history from her husband  and her daughter.  When I saw the  patient, the patient's husband had gone home.  The patient's daughter  mentioned that she has not been able to wake up since then.  On  persistent calling of her name and questioning her for around 10  minutes, the patient woke up; she mentioned that she does not have any  pain, and she reconfirmed after 10 minutes also that she is not hurting  anywhere except for lower abdominal pain.  The patient denied any  headache.  The patient denied any fever, chills, nausea, vomiting and  pulmonary complaints, cardiac complaints, urinary complaints at the time  of the encounter.  The patient was answering questions in yes and no,  but she is not consistently answering questions, and she is going back  to sleep, also.  Abdominal ultrasound done over the bladder in the ED  showed 2 L of urine retention in her bladder.  A Foley catheter was  placed  by ER physician, and urine was drained before I saw her.   PAST MEDICAL HISTORY:  Includes hypertension, chronic abdominal pain on  methadone, history of diverticulitis, status post partial bowel  resection, gastroesophageal reflux disease, hyponatremia, hypokalemia,  coronary artery disease, status post MI, status post CABG in 2005.   PAST SURGICAL HISTORY:  History of partial bowel resection, history of  coronary artery bypass graft.   FAMILY HISTORY:  Not able to be obtained due to the patient's mental  status.   SOCIAL HISTORY:  Not able to be obtained due to the patient's mental  status.   REVIEW OF SYSTEMS:  Not able to be obtained due to the patient's mental  status.   ALLERGIES:  The patient has a long allergy list which includes CIPRO,  NOVOCAIN, PENICILLIN, POTASSIUM, NUBAIN, DOXYCYCLINE, SULFA, LIDOCAINE,  IV DYE, ERYTHROMYCIN.  On questioning the patient about allergies, she  mentioned that she has been able to tolerate Augmentin, amoxicillin and  Keflex in the past.  I really doubt she has all these  allergies.  On  questioning the daughter, she mentions that she has history of nausea,  vomiting to most of the medication described as well as sometimes rash  but no life-threatening situation or anaphylaxis from any of this  medication.   PHYSICAL EXAMINATION:  VITAL SIGNS:  Blood pressure 124/84, respirations  18, pulse 46, temperature 98.5, oxygen saturation 100% on room air.  GENERAL:  The patient wakes up on questioning.  She is able to answer  yes and no, and then she goes back to sleep.  She is able to cooperate  with neurological exam during her brief period of lucidity.  The patient  is confused, otherwise, not oriented to time, place and person.  CARDIOVASCULAR:  S1-S2 regular.  No murmur or gallop.  The patient is  bradycardic.  ABDOMEN:  Nontender over the upper part.  There is mild tenderness over  the lower abdomen.  Scar from previous surgery noted.  No organomegaly  noted.  Bowel sounds are present.  RESPIRATORY:  Clear to auscultation bilaterally anteriorly and  posteriorly, no extra sounds heard.  EXTREMITIES:  No clubbing, cyanosis, or edema.  Pulses are palpable in  all four extremities.  Scar from previous saphenous vein harvest noted.  SKIN:  No rash or bruising.  NEUROLOGICAL:  Exam shows intact cranial nerves.  Speech is intact.  Muscular strength, sensation and reflexes are intact also.  HEENT:  Head:  Normocephalic, nontraumatic.  Eyes:  Pupils react to light and  accommodation.  Extraocular muscles are intact.  No conjunctival  suffusion or any subconjunctival hemorrhage noted.  Oral cavity:  Oral  mucosa dry.  No dentures in mouth.  NECK:  The neck shows no lymphadenopathy, JVD or thyroid enlargement.   LABORATORIES:  The EKG with 46 beats per minute, sinus bradycardia  normal axis, normal QRS, Q-waves inferiorly, nonspecific ST-T wave  changes.  Lipase within normal limits.  Comprehensive metabolic panel  shows sodium 124, potassium 3.3, chloride 87,  glucose 101, total protein  5.9, albumin 3.4, GFR 43, otherwise unremarkable.  First set of troponin  with cardiac enzymes unremarkable.  CBC with differential unremarkable.  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  Portable chest x-ray:  One view shows no acute  disease, no significant change.  Urinalysis unremarkable for any signs  of infection.   ASSESSMENT:  1. Urinary retention in patient with history of urinary retention in      the past.  Patient is followed by urology, Dr. Vonita Moss, in [facility].  2. Altered mental status, most likely secondary to combination of      methadone, zolpidem, lorazepam taken this morning; otherwise      neurological exam and workup have been unremarkable so far.  3. Hypokalemia.  4. Hyponatremia.  5. Asymptomatic bradycardia.  6. History of hypertension.  7. History of chronic abdominal pain on methadone.  8. History of diverticulitis, status post partial bowel resection.  9. History of gastroesophageal reflux disease.  10.History of coronary artery disease, status post coronary artery      bypass grafting   PLAN:  Will admit the patient on [facility] on telemetry bed with  diagnoses of altered mental status, urinary retention, hyponatremia,  hypokalemia and asymptomatic bradycardia.  The patient's code status is  full code.  Recheck vitals, input and output every 8 hours.  Neuro check  every 2 hours for the next 24 hours.  The patient's family was notified  to stay in the room due to the language barrier and the patient's  confused mental status tonight.  The patient will be n.p.o. except  medicine, ice and water at this time, but that can be progressed to low-  salt diet if confusion improves.  Will provide IV normal saline with 40  mEq of potassium chloride at 50 mL per hour for 1 L.  Will provide p.o.   potassium chloride 20 mEq b.i.d. for hypokalemia.  Will provide p.o.  acetaminophen, IV Zofran for fever and nausea respectively.  We will  continue methadone 10 mg q.24 h. p.r.n. for chronic pain.  Will provide  Nitrostat sublingual 0.4 mg every 5 minutes p.r.n. for chest pain.  Will  provide IV Nexium and SCDs on legs for GI and DVT prophylaxis  respectively.  The patient's husband will provide the medication list  later tonight, and nurses will be advised to call me when the list  arrives so we can restart her home medication; otherwise, they will be  started tomorrow.  Further plans according to workup pending.  The  patient may need a urology workup for her urinary retention.  The  patient has a Foley catheter in place.  According to daughter, the  patient was seen by Dr. Larey Dresser in Urology Clinic a couple of  days ago.  I think we need to give him a call in the morning and confirm  the plan that she needs a Foley to take home or if she needs any  medication for her urinary retention treatment year.  That can be  decided tomorrow morning.  The patient is stable, otherwise.      Donalynn Furlong, MD  Electronically Signed     TVP/MEDQ  D:  12/04/2008  T:  12/04/2008  Job:  [id number]   cc:   Georgina Quint. Plotnikov, MD  Maretta Bees. Vonita Moss, M.D.

## 2010-09-19 NOTE — Assessment & Plan Note (Signed)
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  She is dependent on narcotics for control of the pain.  She has had extensive workup, even sigmoid resection for diverticulitis.  We have sent her to [facility] to Dr. Mechele Young to rectal  motility and colonic motility clinic, and she underwent series of tests  including anal manometry with findings of paradoxical straining  consistent with pelvic floor dyssynergia.  She was  decreased  anal  sphincter pressure, delayed sensation, and urge thresholds.  She had  normal pelvic floor resting pressures, and no evidence of pudendal nerve  injury.  She has been recommended for biofeedback, and would prefer to  do the treatment in [place].  She is generally somewhat improved,  taking a Fleets enema every morning to evacuate the rectum to prevent  pelvic pain with evacuation of stool.  This has been effective.  She  also takes methadone 10 mg about 2 to 3 times a day.   PHYSICAL EXAMINATION:  Blood pressure 120/70.  Pulse 72.  Weight 180  pounds.  She came with an interpreter, [name].  Telephone number [phone]  [phone].  Patient was alert, oriented, and cooperative.  She looked  somewhat more comfortable, and in good spirits than usual.  LUNGS:  Clear to auscultation.  COR:  With normal S1 and normal S2.  ABDOMEN:  Was soft with minimal tenderness in right lower quadrant and  right middle quadrant.  RECTAL EXAM:  Not done today.   IMPRESSION:  1. A 77 year old white female with pelvic floor dyssynergia diagnosed      recently on anal manometry at __________  Hospital.  She is      recommended to undergo biofeedback therapy.  2. History of  colon polyps.  Last colonoscopy in March 2007.  3. Chronic pain syndrome.  Patient controlled with methadone 10 mg 2      to 3 times a day.   PLAN:  1. I have put a call in for Dr. Dorita Young to give me accommodation for      biofeedback center in [place] so we can refer patient there for      treatment.  2. Probiotics daily.  Samples given of Align and FloraQ .  3. Continue methadone 10 mg 2 to 3 times a day.  I will give her a new      prescription for 90 pills.     [name]. Sandy Chance, MD  Electronically Signed    DMB/MedQ  DD: 12/31/2006  DT: 01/01/2007  Job #: [id number]   cc:   [name]. Plotnikov, MD

## 2010-09-19 NOTE — Assessment & Plan Note (Signed)
[facility]                           PRIMARY CARE OFFICE NOTE   [name], [name]                 MRN:          [id number]  DATE:10/15/2006                            DOB:          08-22-1927    PROCEDURE:  Left hip injection.   INDICATION:  Trochanteric bursitis.   Risks including unsuccessful procedure, bleeding, infection, skin  atrophy and other, as well as benefits explained to the patient in  detail.  She agreed to proceed.  She was placed in the right decubitus  position.  The painful area was identified and prepped with Betadine and  alcohol, injected in the usual fashion with 80 mg of Depo-Medrol and 3  mL of 2% lidocaine with epinephrine.  Since the patient recalled some  redness with local anesthetic previously locally (no systemic reactions)  I gave her Xyzal one tablet daily for a day or two.     Georgina Quint. Plotnikov, MD  Electronically Signed    AVP/MedQ  DD: 10/16/2006  DT: 10/17/2006  Job #: [id number]

## 2010-09-22 NOTE — H&P (Signed)
[name], [name] NO.:  [id number]   MEDICAL RECORD NO.:  [id number]          PATIENT TYPE:  EMS   LOCATION:  MAJO                         FACILITY:  [facility]   PHYSICIAN:  Willow Ora, MD           DATE OF BIRTH:  1927/07/13   DATE OF ADMISSION:  12/08/2005  DATE OF DISCHARGE:                                HISTORY & PHYSICAL   CHIEF COMPLAINT:  Abdominal pain.   HISTORY OF PRESENT ILLNESS:  [name] is a 77 year old female  originally from [place].  The interview is limited due to the language  barrier, but I am helped with translation by one family member.  The patient  has a diagnosis of chronic diverticulitis, status post left hemicolectomy  due to chronic abdominal pain in June, 2005.  Since then, she has continued  to have pain, but it was relatively well controlled up to a week ago, when  she noticed that her routine pain medication was not helping.  At the same  time, she developed nausea and vomiting.  At the ER, the emergency room  physician requested me to admit the patient for further observation.  The  pain is located mostly at the lower abdomen and is worse after she eats.   PAST MEDICAL HISTORY:  1. Coronary artery disease, status post CABG in February, 2004.  She had      cardiac catheterization in January, 2007.  It showed that the graft was      widely patent.  At that time, it was felt that the pain was probably      related with hypertension.  The p.o. nitroglycerin was changed to      nitroglycerin patch with apparently improvement of her symptomatology.  2. History of uterine fibroids.  3. Hypertension.  4. High cholesterol.  5. Multiple drug intolerances.   FAMILY HISTORY:  Noncontributory.   SOCIAL HISTORY:  Does not smoke or drink.   MEDICATIONS:  1. Transdermal nitroglycerin 0.6 mg.  2. Toprol XL 25 1/2 daily.  3. Aspirin 81.  4. Indapamide 25 1/2 a day.  5. Ambien nightly p.r.n.  6. Prilosec 21 p.o. daily.  7.  Duragesic patch, dose unknown.  8. Methadone.  Apparently, she takes 2 10 mg pills t.i.d.  9. Dilaudid, dose unknown.   REVIEW OF SYSTEMS:  Denies any fever or diarrhea.  She is rather  constipated.  No blood in the stools.  Her appetite remains excellent.  No  cough, shortness of breath.  At this point, she denies chest pain.   ALLERGIES:  1. PENICILLIN.  2. CIPRO.  3. NOVOCAIN.  4. SULFA DRUGS.  5. DOXYCYCLINE.  6. IV CONTRAST.  The patient states that she gets a rash with the IV      contrast; however, I noticed they did a cardiac catheterization without      apparent problem.  I wonder if this allergy to contrast is based on any      objective findings.  7. Intolerance to POTASSIUM.   PHYSICAL EXAMINATION:  VITAL SIGNS:  She is afebrile.  Pulse 52,  blood  pressure 131/81, O2 sat 95% on room air.  GENERAL:  Patient is alert, oriented, in no apparent distress.  LUNGS:  She has slightly decreased breath sounds but otherwise clear.  CARDIOVASCULAR:  Regular rate and rhythm.  ABDOMEN:  Nondistended.  Soft.  She has good bowel sounds.  She is slightly  tender at the lower abdomen without mass or rebound.  EXTREMITIES:  No edema.   LABORATORY/X-RAYS:  X-ray of the abdomen, normal.   CT scan of the abdomen and pelvis with oral contrast only is pending.   Urinalysis shows a small amount of Estrace.  White count 7.3, hemoglobin  12.1, platelets 211, potassium 3.1.  BUN and creatinine 20 and 1.4,  respectively.  AST and ALT are normal.  Alkaline phosphatase is 137, which  is slightly high.   ASSESSMENT/PLAN:  Patient is admitted to the hospital with chronic abdominal  pain.  I will provide IV fluids and IV pain medication.  If the CT is  negative, we will consider discharging this patient within 24 hours.  The  pain is described as mostly post prandial.  That may indicate some  intestinal angina.  She may benefit from an abdominal angiography, but  again, there is this question  about IV contrast allergy.  She may benefit  from an MRA of the abdomen.  We will continue with her home medications.  We  need to clarify the dosages of the Duragesic, methadone, and Dilaudid, so at  this time, I am just going to prescribe methadone 10 mg 1 p.o. t.i.d. and  Nubain p.r.n.      Willow Ora, MD  Electronically Signed     JP/MEDQ  D:  12/09/2005  T:  12/09/2005  Job:  [id number]

## 2010-09-22 NOTE — Op Note (Signed)
NAMEEVANELL, REDLICH          ACCOUNT NO.:  [id number]   MEDICAL RECORD NO.:  [id number]          PATIENT TYPE:  AMB   LOCATION:  SDC                           FACILITY:  [facility]   PHYSICIAN:  James A. Ashley Royalty, M.D.DATE OF BIRTH:  Sep 07, 1927   DATE OF PROCEDURE:  04/08/2006  DATE OF DISCHARGE:                               OPERATIVE REPORT   PREOPERATIVE DIAGNOSIS:  1. Fibroid uterus.  2. Endometrial versus endocervical polyp.   POSTOPERATIVE DIAGNOSIS:  1. Fibroid uterus.  2. Endometrial versus endocervical polyp.   PROCEDURE:  1. Diagnostic/operative hysteroscopy.  2. Polypectomy.  3. Dilatation and curettage.   SURGEON:  Rudy Jew. Ashley Royalty, M.D.   ANESTHESIA:  General.   ESTIMATED BLOOD LOSS:  Less than 25 mL.   COMPLICATIONS:  None.   PACKS AND DRAINS:  None.   DESCRIPTION OF PROCEDURE:  The patient was taken to the operating room  and placed in the dorsal supine position.  After a general anesthetic  was administered, she was placed in the lithotomy position and prepped  and draped in the usual manner for vaginal surgery.  Examination under  anesthesia confirmed the retroflexed uterus.  A posterior weighted  retractor was placed per vagina.  The anterior lip of the cervix was  grasped with a single tooth tenaculum.  An attempt was made to sound the  uterus and with this approach, sounding was unsuccessful.  The tenaculum  was placed on the posterior lip of the cervix and ultimately, sounding  was successful using this approach.  The sound, once again, confirmed  the retroflexion and the depth was approximately 9 cm.  Cervix was then  dilated to a size 25-French with Shawnie Pons dilators and the resectoscope was  placed into the uterine cavity using sorbitol as a distension media.  The previously noted polyp was immediately seen with its origin  definitely in the endocervix.  An attempt to visualize higher in the  endometrial cavity was only partially successful.  The  cavity was  partially replaced by fibroid tumors and it appeared that the left tubal  ostia could be visualized.  However, even with careful manipulation and  retraction, the right tubal ostia could not be clearly identified.  However, the appearance of the structures which obstructed the view was  consistent with benign fibroids.  At this point, the decision was made  to abandon any attempts to resect the apparent fibroids as this operator  could not get sufficiently well oriented with to consider resection.   The endocervical polyp was excised using the resectoscope at  approximately 8 watts power.  The specimen was submitted separately to  pathology for histologic studies.  Hemostasis was obtained with the  coagulation waveform at approximately 60 watts power.  Good hemostasis  was noted.   Attention was then turned to the uterine curettage.  A medium size curet  was introduced into the uterine cavity and curettage was performed.  First, a four quadrant technique was employed.  Then, a therapeutic  technique was employed.  All curettings were submitted to pathology  separately for histologic studies.   At this point, the  patient was felt to have benefited maximally from the  surgical procedure.  The vaginal instruments were removed, hemostasis  noted, and the procedure terminated.  The patient was taken to the  recovery room in good condition.  I explained the findings to the  patient's husband directly as well as through the [name] interpreter  which was provided by the hospital.      Fayrene Fearing A. Ashley Royalty, M.D.  Electronically Signed     JAM/MEDQ  D:  04/09/2006  T:  04/09/2006  Job:  [id number]

## 2010-09-22 NOTE — Discharge Summary (Signed)
NAME:  [name], [name]                    ACCOUNT NO.:  [id number]   MEDICAL RECORD NO.:  [id number]                   PATIENT TYPE:  INP   LOCATION:  3731                                 FACILITY:  [facility]   PHYSICIAN:  Rene Paci, M.D. [facility]          DATE OF BIRTH:  Dec 23, 1927   DATE OF ADMISSION:  09/23/2003  DATE OF DISCHARGE:  09/28/2003                                 DISCHARGE SUMMARY   DISCHARGE DIAGNOSES:  1. Abdominal pain.  2. Nausea.  3. Failure to thrive.  4. Diverticulosis.  5. Uterine fibroids.   REASON FOR ADMISSION AND HISTORY:  [name] is a 77 year old [name]  female who has been followed by Dr. Posey Rea for abdominal pain. She  describes severe abdominal pain, constant nausea, inability to eat or drink  for several days. She states that her abdominal pain and anorexia have been  ongoing for 3 months and have progressively worsened. She was seen in the  office on the day prior to admission and started on oral Bactrim and Flagyl  for presumed diverticulitis. She states she was unable to keep these  medications down.   PAST MEDICAL HISTORY:  1. Diverticulosis.  2. Status post endoscopy in 2005 revealing hiatal hernia.  3. Status post colonoscopy in 2005 confirming diverticulosis.  4. Uterine fibroids, diagnosed by pelvic ultrasound as part of her ongoing     abdominal pain workup.  5. Adrenal adenoma, also found on pelvic ultrasound. A 5HIAA was normal.  6. Coronary artery disease, status post CABG.  7. Hypertension.  8. Dyslipidemia.  9. Multiple drug intolerances.  10.      History of acute inferior MI in August 2004.  11.      Fibroadenoma of the left breast.  12.      Recurrent UTIs.  13.      Remote history of removal of uterine fibroids.   HOSPITAL COURSE:  Problem 1. GI. The patient presented with abdominal pain.  This has been ongoing. The patient has had an ongoing GI evaluation; this  has included an endoscopy and colonoscopy  revealing hiatal hernia and  diverticulosis. Also including a CT of the abdomen and pelvis which  confirmed the diverticulosis and an abnormal uterus. This was followed up  with a pelvic ultrasound which revealed uterine fibroids and adrenal  adenoma. As noted the patient's 5HIAA was normal. The patient was evaluated  in the hospital by Dr. Leone Payor and he recommended CT angiogram and this was  negative for any mesenteric ischemia. This was later followed by a small-  bowel follow-through which was also normal. The etiology of her abdominal  pain is not clear, but could be secondary to her diverticulosis. However,  with the question of uterine fibroids this needs to be further evaluated to  rule out a gynecological process as the etiology of her pain. Therefore, the  patient will need an outpatient GYN evaluation and outpatient pelvic  ultrasound. Dr. Juanda Chance also  recommended a barium enema to see if the  patient's colon function could be further evaluated. A last resort may be  partial resection for her diverticular disease, but again it is felt that  complete gynecological evaluation is indicated first.   Problem 2. Anorexia, failure to thrive and weight loss. The patient reports  a 30-pound weight loss. It was noted her albumin was also low. Prealbumin is  pending. Again, part of our concern may need to be underlying malignancy.  Again, the patient will need an outpatient GYN evaluation.   Problem 3. Depression. This has also been discussed as the etiology of her  pain. The patient has agreed to a trial of Lexapro.   Problem 4. Multiple drug intolerances. This is also a consideration as the  etiology of her pain and her nonessential medications including her Toprol  have been discontinued.   LABORATORY AT DISCHARGE:  CBC was normal. BMET was normal. Sed rate was  elevated at 28. Albumin was 3.1, prealbumin is still pending. LFTs are  normal. Cholesterol was 212, triglycerides were  155, HDL 32, LDL 149. TSH  was 3.061. Urinalysis was negative.   MEDICATIONS AT DISCHARGE:  1. Protonix 40 mg daily.  2. Lexapro 10 mg 1/2 tab daily.  3. Colace 100 mg b.i.d.  4. Senokot daily.  5. As noted, many of her medications have been discontinued. If these are to     be resumed, they should be resumed by her primary care physician.   FOLLOWUP:  With Dr. Posey Rea on Thursday, June 2, at 8 a.m. The patient  will also have an outpatient pelvic ultrasound and barium enema. The patient  will need referral to gynecology by Dr. Posey Rea.      Cornell Barman, P.A. [facility]                  Rene Paci, M.D. [facility]    LC/MEDQ  D:  09/28/2003  T:  09/29/2003  Job:  [id number]   cc:   Lina Sar, M.D. [facility]

## 2010-09-22 NOTE — H&P (Signed)
NAME:  [name], [name]                    ACCOUNT NO.:  [id number]   MEDICAL RECORD NO.:  [id number]                   PATIENT TYPE:  INP   LOCATION:  3731                                 FACILITY:  [facility]   PHYSICIAN:  Georgina Quint. Plotnikov, M.D. [facility]      DATE OF BIRTH:  [birth year]   DATE OF ADMISSION:  09/23/2003  DATE OF DISCHARGE:                                HISTORY & PHYSICAL   I do not have her chart in the office at present.   CHIEF COMPLAINT:  Abdominal pain, weakness, anorexia.   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  In fact, the problem with her abdominal pain and anorexia has been going on  for three months with gradual worsening.  I saw her yesterday in the office  and started her on oral Bactrim and Flagyl for presumed diverticulitis.  She  was unable to keep the medicine down and presents today because she is  feeling worse.   ALLERGIES:  DOXYCYCLINE, DEMEROL, NOVOCAINE, CIPRO, PENICILLIN.  She also  has some other allergies that she cannot recall.   PAST MEDICAL HISTORY:  Diverticulosis, abdominal pain as above, status post  GI evaluation by Iva Boop, M.D. [facility].  As I recall, she had some  hepatic or adrenal cysts.  She also had a colonoscopy.  Coronary artery  disease, status post bypass surgery.  Hypertension.  Dyslipidemia.  Multiple  drug intolerances.   SOCIAL HISTORY:  She is a nonsmoker and nondrinker.  She is a retired  Librarian, academic.  She is married. She lives with her husband.   FAMILY HISTORY:  Positive for coronary artery disease.   REVIEW OF SYSTEMS:  Minor weight loss, anorexia.  No vomiting.  Persistent  nausea.  No diarrhea.  No blood in the stool.  Denies constipation.  No  chest pain, syncope, or shortness of breath.  No skin lesions.  The rest is  negative.   MEDICATIONS:  Bactrim and Flagyl as above. Other medications are on hold for  now including Toprol.   PHYSICAL EXAMINATION:  VITAL SIGNS:  Blood pressure 110/60, pulse 68,  temperature 98.9, weight 173 pounds.  She appears chronically ill.  HEENT:  Dry oral mucosa.  No jaundice.  NECK:  Supple, no thyromegaly or bruits.  LUNGS:  Clear.  No wheezes or rales.  HEART:  S1 and S2.  No murmurs, rubs, or gallops.  Not tachycardic.  Grade  2/6 systolic murmur.  ABDOMEN:  Soft, diffusely tender, no rebound symptoms.  No masses felt.  RECTAL:  Not done.  EXTREMITIES:  Lower extremities without edema.  NEUROLOGY:  She is alert, oriented, and cooperative.   ASSESSMENT:  1. Abdominal pain, possible diverticulitis, however, alternative etiologies     should be considered.  Will obtain a repeat CAT scan of the abdomen  and     pelvis.  Obtain CBC, sed rate, chemistries, and other tests.  I will     start her empirically on Flagyl and Bactrim intravenously.  2. Nausea refractory to Phenergan p.r.n. and Protonix IV.  3. Dehydration.  Will treat with IV fluids.  4. Weakness, multifactorial.  5. Multiple allergies to medications.  She will make a new list and present     to nurses in case I miss some allergies.  6. Coronary artery disease, status post bypass surgery.  Asymptomatic.  No     angina lately. Her Toprol is on hold.                                                Georgina Quint. Plotnikov, M.D. [facility]    AVP/MEDQ  D:  09/23/2003  T:  09/24/2003  Job:  [id number]   cc:   Iva Boop, M.D. [facility]

## 2010-09-22 NOTE — Assessment & Plan Note (Signed)
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 Samule Ohm on November 08, 2005, with  chest discomfort.  Hospitalization was discussed with the patient who  decided that she would not go to the hospital.  Dr. Samule Ohm switched her from  Imdur to nitroglycerin patch.  He also did troponin study which was negative  at 0.03.  The patient returns today for followup.  She was suppose to be on  0.4 patch, but somehow got on a 0.6 mg patch.  She notes that she feels much  better since her nitroglycerin patch was started.  She denies any recurrent  chest pain like what she saw Dr. Samule Ohm for.  She does not speak [place] and  I communicated with her through an interpreter.  After extensive discussion,  it sounds as though the patient has chronic stable angina when she walks.  This has really not changed any.  She was having more episodes of different  types of chest pain over the last 3 months and these have completely  resolved since she saw Dr. Samule Ohm and got placed on the nitroglycerin patch.  She denies any syncope, presyncope, orthopnea or paroxysmal nocturnal  dyspnea.   CURRENT MEDICATIONS:  1.  Prilosec 20 mg two tablets a day.  2.  Ambien 10 mg nightly.  3.  Toprol XL 25 mg 1/2 tablet daily.  4.  Indapamide 25 mg 1/2 tablet daily.  5.  Aspirin 81 mg daily.  6.  Duragesic patch.  7.  Methadone.  8.  Nitroglycerin patch 0.6 mg.  9.  Dilaudid.   ALLERGIES:  PENICILLIN, SULFA, DOXYCYCLINE, CIPRO, IV CONTRAST, AUGMENTIN.   PHYSICAL EXAMINATION:  GENERAL:  She is a well-nourished, well-developed  female  in no acute distress.  VITAL SIGNS:  Blood pressure 120/74, pulse 67, weight 197 pounds.  HEENT:  Unremarkable.  NECK:  Without JVD at 90 degrees.  CARDIAC:  Normal S1, S2, regular rate and rhythm.  LUNGS:  Clear to auscultation bilaterally without wheezing, rhonchi or  rales.  EXTREMITIES:  With trace edema bilaterally.  Calves are soft, nontender.   Electrocardiogram reveals sinus rhythm with a heart rate of 67, inferior Q  waves.  No significant change from previous tracings.   IMPRESSION:  1.  Chest pain, improved on nitroglycerin patch.  2.  Chronic stable angina.  3.  Coronary artery disease.      1.  Status post coronary artery bypass graft with patent grafts on          catheterization January 2007.      2.  Nonischemic Myoview May 2007.  4.  Preserved left ventricular function.  5.  Hypertension.  6.  Hypercholesterolemia.  7.  Chronic abdominal pain.  PLAN:  The patient seems to be doing better since starting on the  nitroglycerin patch.  She is somewhat concerned that it is a 0.6 mg patch  and not 0.4 mg.  She seems to be tolerating it well, but after much  discussion, it has been decided to go ahead and reduce her back to 0.4 mg.  She knows if she develops any recurrent symptoms, she should call us and we  could certainly go back up on her dose.  I discussed this briefly with Dr.  Samule Ohm who agrees.  Will bring her back in followup with Dr. Samule Ohm in the  next 4 months.                                  Sandy Newcomer, PA-C    SW/MedQ  DD:  11/23/2005  DT:  11/23/2005  Job #:  [id number]   cc:   [name], MD

## 2010-09-22 NOTE — Letter (Signed)
January 15, 2006      RE:  [name], [name]  MRN:  [id number]  /  DOB:  01-18-1928   To Whom It May Concern:   There is a medical necessity for Dr. Coolidge Breeze to obtain dental prosthesis  due to the fact that she has been suffering with severe gastrointestinal  illness.  Please assist.    Sincerely,      Sandy Quint. Plotnikov, MD   AVP/MedQ  DD:  01/15/2006  DT:  01/16/2006  Job #:  [id number]

## 2010-09-22 NOTE — Assessment & Plan Note (Signed)
[facility]                            CARDIOLOGY OFFICE NOTE   Sandy, Young                 MRN:          [id number]  DATE:07/05/2006                            DOB:          December 01, 1927    PRIMARY CARE PHYSICIAN:  [name], M.D.   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  She suffered inferior  myocardial infarction in 2004.  I treated her with emergent angioplasty  followed by CABG for revascularization of the remaining vessels.  Her EF  is normal.  She has had chronic stable angina for the past couple of  years, as well as some episodes of atypical chest discomfort.  An  adenosine Cardiolite in May 2000 demonstrated no evidence of ischemia or  infarction.  We have talked about catheterization but she has refused.  We have therefore managed things medically with greater or lesser  success, depending on the time.   She was seen in the ER last week with influenza.  She says she has  recovered.  Potassium was found to be 2.7 and potassium was initiated.  She says that she has not had any diarrhea.  She has had an exacerbation  of her abdominal discomfort as well as daily nausea that she attributes  to the potassium.   CURRENT MEDICATIONS:  1. Methadone 20 mg twice daily.  2. Indapamide 25 mg daily.  3. Toprol-XL 25 mg daily.  4. Omeprazole 20 mg daily.  5. Lorazepam 1 mg p.r.n.  6. Ambien 10 mg daily.  7. NitroQuick p.r.n.  8. Hydroxyzine 25 mg daily.  9. Aspirin 81 mg daily.  10.Promethazine 25 mg daily.  11.Lotrel.  12.Nitroglycerin patch 0.6 mg per hour.  13.KCl 20 mEq daily.   PHYSICAL EXAMINATION:  GENERAL:  She is mildly ill-appearing in no  distress with a heart rate of 69, blood pressure 121/76 and weight of  188 pounds.  Weight is down 3 pounds from January.  NECK:  She has no jugular venous distention, thyromegaly, or  lymphadenopathy.  LUNGS:  Clear to auscultation.  Respiratory effort is normal.  HEART:  She has a nonpalpable point of maximal cardiac impulse.  There  is a regular rate and rhythm without murmur, rub or gallop.  ABDOMEN:  Soft, nondistended, nontender.  There is no  hepatosplenomegaly.  Bowel sounds are normal.  EXTREMITIES:  Warm without clubbing, cyanosis, edema or ulceration.   Electrocardiogram demonstrates normal sinus rhythm with nonspecific ST-T  abnormalities, both inferiorly and anteriorly.   IMPRESSION/RECOMMENDATION:  1. Chronic stable angina:  Pattern seems stable.  Continue aspirin,      beta blocker, and topical nitrate.  2. Prior myocardial infarction.  As per #1.  3. Hypokalemia:  She does take indapamide.  She thinks the potassium      is making her nauseated, which it may well be.  Will check BMET      today and potentially discontinue the potassium.  4. The patient says she is not tolerating a switch from brand-name to      generic metoprolol  succinate.  She cannot be specific about what is      bothering her.  She has agreed to leave it alone while we let her      get over the flu and then sort things out.     Salvadore Farber, MD  Electronically Signed    WED/MedQ  DD: 07/05/2006  DT: 07/05/2006  Job #: [id number]   cc:   Sandy Quint. Plotnikov, MD

## 2010-09-22 NOTE — Discharge Summary (Signed)
[name], [name]          ACCOUNT NO.:  [id number]   MEDICAL RECORD NO.:  [id number]          PATIENT TYPE:  OBV   LOCATION:  5733                         FACILITY:  [facility]   PHYSICIAN:  Bruce Rexene Edison. Swords, MD    DATE OF BIRTH:  February 16, 1928   DATE OF ADMISSION:  12/08/2005  DATE OF DISCHARGE:  12/09/2005                                 DISCHARGE SUMMARY   DISCHARGE DIAGNOSES:  1. Chronic abdominal pain.  2. Possible pyelonephritis versus nephrolithiasis (left hydronephrosis      with perinephric stranding).  3. History of coronary artery disease.  4. Diverticulitis, status post left hemicolectomy.  5. Uterine fibroids.  6. Hypertension.  7. Hyperlipidemia.   DISCHARGE MEDICATIONS:  1. Dilaudid at her usual home dose.  2. Duragesic patch at her usual home dose.  3. Prilosec 20 mg p.o. daily.  4. Ambien 10 mg p.o. q.h.s.  5. _____________ 2.5 mg 1/2 tablet daily.  6. Aspirin 81 mg p.o. daily.  7. Toprol XL 25 mg 1/2 tablet daily.  8. Transdermal nitroglycerin 0.6 mg.   ALLERGIES:  She has multiple intolerances to PENICILLIN, CIPRO, NOVOCAIN,  DOXYCYCLINE, and IV CONTRAST.  Dr. Drue Novel has listed in his note that she is  allergic to SULFA, but she says that she can take trimethoprim  sulfamethoxazole.   FOLLOWUP:  Dr. Posey Rea tomorrow.   CONDITION ON DISCHARGE:  Improved.   HOSPITAL COURSE:  The patient was admitted to the hospital service on November 27, 2005.  See Dr. Leta Jungling admission note.  Her symptoms resolved quickly in  the hospital.  She did have a CAT scan that demonstrated left hydronephrosis  (minimal), and perinephric stranding, either consistent with kidney stone or  pyelonephritis.  I think it is best to consider this pyelonephritis and  treat with antibiotics for 10 days.  She has followup with Dr. Posey Rea  tomorrow.  I have told her if she worsens she should come back to the  emergency department immediately.     Bruce Rexene Edison Swords, MD  Electronically  Signed    BHS/MEDQ  D:  12/09/2005  T:  12/09/2005  Job:  [id number]

## 2010-09-22 NOTE — H&P (Signed)
[name], [name]          ACCOUNT NO.:  [id number]   MEDICAL RECORD NO.:  [id number]          PATIENT TYPE:  AMB   LOCATION:  SDC                           FACILITY:  [facility]   PHYSICIAN:  James A. Ashley Royalty, M.D.DATE OF BIRTH:  Sep 13, 1927   DATE OF ADMISSION:  04/08/2006  DATE OF DISCHARGE:                              HISTORY & PHYSICAL   HISTORY OF PRESENT ILLNESS:  This is a 77 year old female who presented  to me on or about February 19, 2006, complaining of occasional lower  abdominal discomfort.  She does not speak good [place] and the history  was obtained from her husband who does speak some [place].  Examination  on that day revealed a cervical polyp, the origin of which could not be  completely delineated.  Subsequent ultrasound was performed at [facility] on February 25, 2006.  Two central fibroids were noted on the  ultrasound measuring 2.3 and 2.5 cm in greatest diameter, respectively,  both of which displaced the endometrium.  The ovaries were considered  normal for a postmenopausal patient.  Sonohysterogram was attempted at  [facility], but was unsuccessful.  The patient is hence  for diagnostic/operative hysteroscopy and dilatation and curettage.   MEDICATIONS:  1. Methyldopa.  2. Indapamide.  3. Topzol.  4. Omezazole.  5. Nitozoquick.  6. Aspirin.   PAST MEDICAL HISTORY:  1. Diverticulosis.  2. Coronary artery disease, status post coronary artery bypass graft      in August of 2004.  3. Status post myocardial infarction in 2004.  4. Hiatal hernia.  5. History of adrenal adenoma.  6. History of hypertension.  7. History of dyslipidemia.  8. History of multiple drug intolerances.  9. Fibroadenoma of the left breast.   PAST SURGICAL HISTORY:  As above.  No other specific history could be  successfully ferreted out.   ALLERGIES:  SIPZO, PENICILLIN, METRONIDAZOLE, POTASSIUM.   FAMILY HISTORY:  Positive for colon cancer.   SOCIAL  HISTORY:  The patient denies the use of tobacco or significant  alcohol.   REVIEW OF SYSTEMS:  Noncontributory.   PHYSICAL EXAMINATION:  GENERAL:  The patient is a well-developed, well-  nourished, pleasant female in no acute distress.  She does not speak  substantial English at all.  VITAL SIGNS:  Afebrile, vital signs stable.  CHEST:  Lungs are clear.  HEART:  Regular rate and rhythm.  ABDOMEN:  Soft and nontender without masses or organomegaly.  PELVIC:  Please see most recent office examination.  External genitalia  within normal limits.  Vagina and cervix are without gross lesions.  There is a polyp present emanating from the cervix.  Bimanual  examination reveals the uterus to be normal size, shape, and contour and  no adnexal masses palpable.   IMPRESSION:  1. Fibroid uterus.  2. Diverticulosis.  3. Coronary artery disease - status post coronary artery bypass graft      in 2004.  4. Status post myocardial infarction in 2004.  5. Hiatal hernia.  6. History of adrenal adenoma.  7. Hypertension.  8. Dyslipidemia.  9. Multiple drug intolerances.  10.Left fibroadenoma (  breast).  11.Cervical polyp.   PLAN:  Diagnostic/operative hysteroscopy.  Dilatation and curettage.  Risks, benefits, complications, and alternatives were fully discussed  with the patient.  She states she understands and accepts.  Her husband  and previously by an interpreter, Eugenie Birks.  Questions invited  and answered.      James A. Ashley Royalty, M.D.  Electronically Signed     JAM/MEDQ  D:  04/08/2006  T:  04/08/2006  Job:  [id number]

## 2010-09-22 NOTE — Cardiovascular Report (Signed)
NAME:  BEANCA, KIESTER                    ACCOUNT NO.:  [id number]   MEDICAL RECORD NO.:  [id number]                   PATIENT TYPE:  INP   LOCATION:  2925                                 FACILITY:  [facility]   PHYSICIAN:  Salvadore Farber, M.D.             DATE OF BIRTH:  29-Apr-1928   DATE OF PROCEDURE:  12/30/2002  DATE OF DISCHARGE:                              CARDIAC CATHETERIZATION   PROCEDURE:  Left heart catheterization, left ventriculography, thoracic  aortography, placement of intraaortic balloon pump, placement of temporary  pacing wire, balloon angioplasty of the right coronary artery.   INDICATIONS:  Ms. Sherrard is a 77 year old lady without prior history of  cardiac disease who presents with inferior myocardial infarction.  She had  the onset of pain approximately two hours before presenting to the emergency  room.  Electrocardiogram demonstrated inferior ST elevations.  She was  bradycardic, but hemodynamically stable.  She was brought urgently to  catheterization laboratory for catheterization and with an eye to  percutaneous revascularization.   PROCEDURAL TECHNIQUE:  Informed consent was obtained.  Under 1% procaine  local anesthesia a 7-French sheath was placed in the right femoral artery  and a 6-French sheath in the right femoral vein using the modified Seldinger  technique.  Diagnostic angiography and ventriculography were performed using  JL4, JR4, and pigtail catheters.  The case then turned to intervention.   Images demonstrated occlusion of the mid RCA with faint left to right  collaterals.  There was severe disease in both the LAD and circumflex as  well.  Ejection fraction was preserved with inferior akinesis.   Anticoagulation was initiated with heparin and eptifibatide to achieve an  ACT of greater than 200 seconds.  A JR4 guide was advanced over a wire and  engaged in the ostium of the right coronary artery.  A Luge wire was  advanced beyond  the lesion without difficulty.  TIMI 3 flow was established  with passage of the wire alone.  Upon reestablishment of flow the patient  had bradycardia and hypotension which responded to atropine bolus.  Temporary pacing wire was then placed.  Capture was confirmed.   The lesion of the mid RCA was then treated with 3.0 x 20 mm Maverick balloon  at 8 atmospheres for one minute.  Final angiogram demonstrated less than 20%  stenosis, possible nonflow limiting dissection without compromise of the  lumen.  Flow was TIMI 3 to the distal vessel.  Because of plans for CABG, no  stent was placed.   After completion of the angioplasty systolic blood pressure was  approximately 90 mmHg.  Due to the severity of her residual disease and the  need to maintain good flow through the angioplasty site, intra-aortic  counterpulsation balloon was placed.   After reperfusion was established, the rhythm remained stable.  Therefore,  the pacing wire was removed.  The patient tolerated the procedure well and  was transferred to the  cardiac intensive care unit in stable condition with  intraaortic balloon pump in place.   COMPLICATIONS:  None.   FINDINGS:  1. LV 123/9/18.  EF 60% with inferior akinesis.  2. Left main:  Ostial 40% stenosis.  3. LAD:  Moderate sized vessel giving rise to three diagonal branches.  The     proximal LAD has 70% stenosis spanning the takeoff of the moderate sized     first diagonal.  The mid LAD has a focal 80% stenosis after the takeoff     of the first diagonal.  There is a 60% stenosis after the takeoff of the     third diagonal.  The second diagonal has a 50% stenosis in its proximal     portion.  4. Circumflex:  Moderate sized vessel giving rise to a single obtuse     marginal.  There is a 90% stenosis of the proximal vessel.  5. RCA:  A large, dominant vessel.  There is a 30% ostial stenosis.  The     vessel was occluded with thrombus in its mid portion.  After balloon      angioplasty there was less than 20% residual stenosis at this site.     However, downstream in the mid and distal vessel there were serial 80,     80, and 95% stenoses before the takeoff of the PDA.   IMPRESSION/PLAN:  A 77 year old lady presenting with acute inferior  myocardial infarction in the setting of severe multivessel coronary artery  disease.  She underwent successful reperfusion of the infarct artery using  balloon angioplasty.  Will consult CVTS for consideration of CABG.  Will  plan on continuing intraaortic balloon pump support, heparin, and Integrilin  until just prior to CABG.                                               Salvadore Farber, M.D.    WED/MEDQ  D:  12/30/2002  T:  12/30/2002  Job:  [id number]   cc:   Georgina Quint. Plotnikov, M.D. [facility]

## 2010-09-22 NOTE — H&P (Signed)
NAME:  [name], [name]                    ACCOUNT NO.:  [id number]   MEDICAL RECORD NO.:  [id number]                   PATIENT TYPE:  INP   LOCATION:  2925                                 FACILITY:  [facility]   PHYSICIAN:  [place] Bing, M.D.               DATE OF BIRTH:  13-Jun-1927   DATE OF ADMISSION:  12/30/2002  DATE OF DISCHARGE:                                HISTORY & PHYSICAL   REFERRING PHYSICIAN:  Georgina Quint. Plotnikov, M.D. 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  Historical details are unclear due to  the language barrier.  The patient apparently has at least suspected angina  since she takes nitroglycerin.  She also apparently has a history of  hypertension for which she is treated with indapamide.  These are the only  two medications that she reports using.  On arrival in the emergency  department, she was started on nitroglycerin and aspirin.  She was  bradycardic, so beta-blockers were not given.  She had a mildly widened  mediastinum on her chest x-ray, so heparin was withheld.  With this minimal  treatment, her chest discomfort decreased dramatically; however, inferior ST  segment elevation persisted.   PAST MEDICAL HISTORY:  Unknown except as noted above.   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  She immigrated approximately five years ago.  She has a daughter who lives  locally.  She is married and lives with her husband.   FAMILY HISTORY AND REVIEW OF SYSTEMS:  Unobtainable.   PHYSICAL EXAMINATION:  GENERAL:  Stoic, overweight woman.  VITAL SIGNS:  The heart rate is 55 and regular, blood pressure 105/50,  respirations 22, temperature 96.9.  HEENT:  Sallow complexion.  NECK:  Mild jugular venous  distention; normal carotid upstrokes.  LUNGS:  Few bibasilar rales.  CARDIAC:  Modest systolic ejection murmur.  ABDOMEN:  Soft and nontender; no bruits.  EXTREMITIES:  1+ distal pulses; no edema.   EKG:  Sinus bradycardia; 3-4 mm of inferior ST segment elevation with  reciprocal changes in V1 and V2.   INITIAL LABORATORY DATA:  Notable for creatinine of 1.5, potassium 3 and BUN  27.   Chest x-ray:  AP film; left basilar atelectasis; upper mediastinum is  prominent, including some ectasia of the aorta.   Brief bedside echocardiogram:  Normal left ventricular size; LVH; good  septal motion with akinesis of the posterior wall; sclerosis/calcification  of the proximal aorta; aortic root is normal in size with no evidence for  dissection; aortic valvular sclerosis with good mobility of the leaflets;  normal mitral valve.    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  Her diagnosis and treatment  options were discussed with her and her husband via a Nurse, learning disability.  She  understands that there is risk involved in acute intervention, but the risk  of nonintervening is greater and agrees to proceed.  Urgent coronary  angiography and intervention will be undertaken by Dr. Samule Ohm.                                                 [place] Bing, M.D.    RR/MEDQ  D:  12/30/2002  T:  12/30/2002  Job:  [id number]   cc:   Georgina Quint. Plotnikov, M.D. [facility]

## 2010-09-22 NOTE — Discharge Summary (Signed)
NAME:  [name], [name]                    ACCOUNT NO.:  [id number]   MEDICAL RECORD NO.:  [id number]                   PATIENT TYPE:  INP   LOCATION:  5741                                 FACILITY:  [facility]   PHYSICIAN:  Jimmye Norman III, M.D.               DATE OF BIRTH:  Aug 28, 1927   DATE OF ADMISSION:  10/21/2003  DATE OF DISCHARGE:  10/27/2003                                 DISCHARGE SUMMARY   DISCHARGE DIAGNOSIS:  Diverticulosis with chronic diverticulitis.   PRINCIPAL PROCEDURE:  Left hemicolectomy with primary anastomosis.   SURGEON:  Dr. Lindie Spruce.   DISCHARGE MEDICATIONS:  In addition to her preoperative medicines she will  be taking Darvocet-N 100 to take as needed for pain.   DISCHARGE DIET:  Post colectomy and diverticulitis-type diet with no pitted  foods, small seeds.   CONDITION:  Stable.   FOLLOW UP:  She is to follow up to see me on the 28th at 10:30 a.m. for a  followup.  Most of her staples have been removed.  She can shower and pat  her wound dry.   DISCHARGE ACTIVITIES:  No lifting greater than 25 pounds.  Can ambulate as  tolerated.  No driving for at least a week.   BRIEF SUMMARY OF HOSPITAL COURSE:  The patient was admitted the day of  surgery on the 16th for a colectomy after she had had a preoperative  evaluation including Gastrografin barium study and a CT scan.  She did not  have much of diverticulitis, but at the time of surgery was found to have  multiple adhesions which were taken down.  Postoperatively she did extremely  well, was treated with Reglan and albumin in the perioperative period.  Had  a DVT study done on postoperative day #4 because of leg swelling and some  post popliteal pain.  This was negative for any DVT.  Her maximal  temperature in her postoperative period was about 100.6.  She is afebrile at  the time of discharge, and her maximal temperature in the last 24 hours was  99.2.  Her wound looks viable with no evidence of  infection.  There is no  drainage.  Most of the staples have been removed and Steri-strips applied.  She  has got excellent bowel sounds with the one exception of having some  abdominal cramping with defecation yesterday, has been pretty much pain-  free, treated with Darvocet-N 100.  She will follow up to see me on the 28th  at 10:30 a.m.                                                Kathrin Ruddy, M.D.    JW/MEDQ  D:  10/27/2003  T:  10/28/2003  Job:  [id number]   cc:   Casimiro Needle  Esther Hardy, M.D. [facility]   Aleksei V. Plotnikov, M.D. [facility]

## 2010-09-22 NOTE — Cardiovascular Report (Signed)
NAMEEVANNA, WASHINTON          ACCOUNT NO.:  [id number]   MEDICAL RECORD NO.:  [id number]          PATIENT TYPE:  OIB   LOCATION:  2899                         FACILITY:  [facility]   PHYSICIAN:  Salvadore Farber, M.D. LHCDATE OF BIRTH:  1928-01-29   DATE OF PROCEDURE:  05/16/2005  DATE OF DISCHARGE:                              CARDIAC CATHETERIZATION   PROCEDURE:  Left heart catheterization, left heart catheterization, coronary  and bypass graft angiography, Star device closure of the right common  femoral arteriotomy site.   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  She underwent  emergent coronary artery bypass grafting after I restored flow to the  culprit lesion in the RCA using balloon angioplasty.  Since then, she had  done well until approximately two months ago.  Over the past couple of  months, she has had exertional chest discomfort, worse with walking up a  hill and accompanied by dyspnea.  She has had no pain at rest and has not  had any paroxysmal nocturnal dyspnea or orthopnea.  Due to the classic  nature of her symptoms she is referred for diagnostic angiography.   PROCEDURE TECHNIQUE:  Informed consent was obtained.  Due to preexisting  renal dysfunction, she was prehydrated with sodium bicarbonate per protocol.  Under 1% lidocaine and local anesthesia, a 5-French sheath was placed in the  right common femoral artery using the modified Seldinger technique.  Diagnostic angiography was performed using JL-4 and JR-4 catheters for the  native coronaries, JR-4 for each of the vein grafts, and LIMA catheter for  the left internal mammary artery.  Left heart catheterization was performed  using a pigtail catheter.  Left ventriculography was not performed to  minimize contrast use.  Finally, the arteriotomy was closed using a Star  close device. Complete hemostasis was obtained.   She was then transferred to  the holding room in stable condition.   COMPLICATIONS:  None.   FINDINGS:  1.  LV: 144/03/2023.  2.  Left main:  Ostial 30% stenosis.  3.  LAD:  90% stenosis of the proximal vessel and 80% stenosis of the mid      vessel.  There are two diagonals.  The vein graft to the second diagonal      was widely patent.  The LIMA to LAD is widely patent.  4.  Circumflex:  Moderate size vessel giving rise to a single obtuse      marginal.  The proximal vessel has a long 80% stenosis.  The vein graft      to the marginal was widely patent.  5.  RCA:  Vessel was occluded in its mid section. The vein graft to the PDA      is widely patent and supplies the PLD in a retrograde fashion.  6.  Left subclavian:  Angiographically normal.   IMPRESSION/PLAN:  All of her grafts are widely patent.  I suspect her angina  is related to hypertension.  Medical compliance is questionable at best.  I  have emphasized compliance with her aspirin and  beta blocker.  I will also  adding Imdur.      Salvadore Farber, M.D. [facility]  Electronically Signed     WED/MEDQ  D:  05/16/2005  T:  05/16/2005  Job:  [id number]   cc:   Alex (?)

## 2010-09-22 NOTE — Op Note (Signed)
NAME:  [name], [name]                    ACCOUNT NO.:  [id number]   MEDICAL RECORD NO.:  [id number]                   PATIENT TYPE:  INP   LOCATION:  2314                                 FACILITY:  [facility]   PHYSICIAN:  Salvatore Decent. Cornelius Moras, M.D.              DATE OF BIRTH:  1927-09-20   DATE OF PROCEDURE:  01/01/2003  DATE OF DISCHARGE:                                 OPERATIVE REPORT   PREOPERATIVE DIAGNOSIS:  Severe 3-vessel coronary artery disease, status  post acute myocardial infarction.   POSTOPERATIVE DIAGNOSIS:  Severe 3-vessel coronary artery disease, status  post acute myocardial infarction.   PROCEDURE:  Median sternotomy for coronary artery bypass grafting x4 with  endoscopic vein harvest from left thigh (left internal mammary artery to the  distal left anterior descending coronary artery, saphenous vein graft to the  1st diagonal branch, saphenous vein graft to the circumflex marginal branch,  saphenous vein graft to the posterior descending coronary artery).   SURGEON:  Salvatore Decent. Cornelius Moras, M.D.   ASSISTANT:  Toribio Harbour, N.P.   ANESTHESIA:  General.   INDICATIONS FOR PROCEDURE:  The patient is a 77 year old obese female from  [place], followed  by Dr. Posey Rea and referred by Dr. Samule Ohm for  management  of coronary artery disease. The patient was admitted on December 30, 2002, with an acute myocardial infarction. Findings at the time of  catheterization were notable for severe 3-vessel coronary artery disease  with acute occlusion of the right coronary artery. The patient underwent  angioplasty of the right coronary artery with restoration of antegrade flow.  She has remained hemodynamically stable since then. An intraaortic balloon  pump was placed at the time of catheterization. A full consultation note has  been dictated previously and the patient now presents for elective surgical  revascularization. The patient and her husband have  provided full  informed  consent through the use of an interpreter for explanation of the risks and  benefits of surgery. All of their questions have been addressed.   DESCRIPTION OF PROCEDURE:  The patient was brought to the operating room on  the above mentioned date and placed in the supine position on the operating  table. Central monitoring was established by the anesthesia service under  the care and direction of Dr. Jean Rosenthal. Specifically, a Swann-Ganz catheter  was placed through the right internal jugular approach. A radial arterial  line was placed. Intravenous antibiotics were administered. The existing  intraaortic balloon pump was continuously monitored and verified for  appropriate function. Following  induction with general endotracheal  anesthesia, a Foley catheter was placed. The patient's chest,  abdomen, both  groins and both lower extremities were prepped and draped in a sterile  manner.   A median sternotomy incision was performed and the left internal mammary  artery, dissected from the chest wall and prepared for bypass grafting. The  left internal mammary artery is a good quality  conduit, although a very  small  caliber. It has adequate forward flow. Simultaneously the saphenous  vein was obtained from the patient's left thigh using endoscopic vein  harvest technique. An additional segment of saphenous vein is obtained from  the upper portion of the left lower leg using several open longitudinal  incisions. The majority of the vein conduit is good quality, although the  small  portion of the vein from below the knee is somewhat small  caliber.  The patient is heparinized systemically.   The pericardium is opened. The ascending aorta is normal in appearance. The  heart is normal in appearance. The ascending aorta and the right atrium are  cannulated for cardiopulmonary bypass. Adequate heparinization is verified.   Cardiopulmonary bypass is begun and  the surface of the heart   is inspected.  Overall the heart appears essentially normal. Distal sites are selected for  coronary artery bypass grafting. Portions of the saphenous vein and the left  internal mammary artery are trimmed to appropriate lengths. A temperature  probe is placed in the left ventricular septum. A cardioplegia catheter is  placed in the ascending aorta.   The patient is cooled to 32 degrees systemic temperature. The aortic cross  clamp  is applied and cardioplegia is delivered in an antegrade fashion  through the aortic root. Ice saline slush is applied for topical  hypothermia. The initial cardioplegic arrest and myocardial cooling are felt  to be excellent. Repeat doses of cardioplegia are administered  intermittently throughout the cross clamp portion of the operation, both  through the aortic root and down the subsequently placed vein grafts to  maintain septal temperature  below 15 degrees centigrade. The following  distal  coronary anastomoses are performed:   1. The posterior descending coronary artery is grafted with a saphenous vein     graft in an end-to-side fashion. This coronary measures 1.5 mm in     diameter and is of good quality.  2. The circumflex marginal branch is grafted with a saphenous vein graft in     an end-to-side fashion. This coronary measures 1.7 mm in diameter and is     of good quality.  3. The  1st diagonal branch off the left anterior descending coronary artery     is grafted with a saphenous vein graft in an end-to-side fashion. This     coronary measures 1.53mm in diameter and is of fair quality. The saphenous     vein conduit to the diagonal branch is somewhat small  caliber, but     otherwise  good quality.  4. The distal left anterior descending coronary artery is grafted with the     left internal mammary artery in an end-to-side fashion. This coronary     measures 1.5 mm at the site of the distal  bypass and is of good quality.  All 3 proximal  saphenous vein anastomoses are performed directly to the  ascending aorta prior to removal of the aortic cross clamp. The septal  temperature  is noted to rise rapidly and dramatically upon reperfusion of  the left internal mammary artery. The aortic cross clamp  is removed after a  total cross clamp  time of 62 minutes. All air is evacuated from the aortic  root prior to removal of the cross clamp.   The heart begins to beat spontaneously without need for cardioversion. All  proximal  and distal  anastomoses are inspected for hemostasis and  appropriate graft  orientation. Epicardial pacing wires are fixed to the  right ventricular outflow tract into the right atrial appendage. The patient  is rewarmed to 37 degrees centigrade temperature. The patient is weaned from  cardiopulmonary bypass without difficulty. The patient's rhythm at  separation from bypass is normal sinus rhythm. Atrial pacing is employed to  increase  the heart rate. No inotropic support is required. Total  cardiopulmonary bypass time of the operation is 77 minutes.   The venous and arterial cannulae are removed uneventfully. Protamine is  administered to reversed anticoagulation. The mediastinum and the left chest  are irrigated with saline solution containing vancomycin. Meticulous  surgical hemostasis is ascertained. The mediastinum and the left chest are  drained with 3 chest tubes placed through separate stab incisions  inferiorly. The median sternotomy is closed in a routine fashion. The lower  extremity incisions were closed in multiple layers in a routine fashion. All  skin  incisions are closed with subcuticular skin closures.   The patient tolerated the procedure well and is transported to the surgical  intensive care unit in stable condition. There were no interoperative  complications. All sponge, instrument and needle counts were verified  correct  at the completion of the operation. No blood products  were  administered.                                               Salvatore Decent. Cornelius Moras, M.D.    CHO/MEDQ  D:  01/01/2003  T:  01/02/2003  Job:  [id number]   cc:   Salvadore Farber, M.D.   Georgina Quint. Plotnikov, M.D. [facility]

## 2010-09-22 NOTE — Discharge Summary (Signed)
   NAME:  [name], [name]                    ACCOUNT NO.:  [id number]   MEDICAL RECORD NO.:  [id number]                   PATIENT TYPE:  INP   LOCATION:  2038                                 FACILITY:  [facility]   PHYSICIAN:  Salvatore Decent. Cornelius Moras, M.D.              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  Her urinalysis came back as negative, however, urine  culture did return this morning, result positive for Pseudomonas sensitive  to Cipro.  Dr. Orvan July recommendation was to go ahead and treat this with  Cipro for a seven-day course.  He has also recommended followup in one week  with Dr. Vonita Moss.  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 Vonita Moss for  recurrent UTIs.  She is otherwise doing very well and ready for discharge  home this morning.  There have been no other medication changes from the  previous dictation except for the addition of Cipro 250 mg p.o. b.i.d. for  seven days.   Please send copy of original discharge summary, Job [id number], to Dr. Vonita Moss  at his office.      N.P.                                      Salvatore Decent. Cornelius Moras, M.D.    Hadley Pen  D:  01/08/2003  T:  01/09/2003  Job:  [id number]   cc:   Dr. Shannan Harper, M.D.

## 2010-09-22 NOTE — Assessment & Plan Note (Signed)
[facility]                            CARDIOLOGY OFFICE NOTE   [name], [name]                 MRN:          [id number]  DATE:05/21/2006                            DOB:          [birth year]    PRIMARY CARE PHYSICIAN:  Sandy Quint. Plotnikov, MD.   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  She suffered inferior  myocardial infarction in 1994.  I treated her with emergent angioplasty  followed by coronary artery bypass grafting for revascularization of the  remaining vessels.  Ejection fraction is normal.   She has had chronic stable angina for the past couple of years, as well  as some episodes of atypical chest discomfort.  Chest pain has seemed to  bother her more at times than at others.  An adenosine Cardiolite in May  2007 demonstrated ejection fraction of 64% with no evidence of ischemia  or infarction.   She presents today with complaints of some more chest pain over the past  several months.  However, when questioning, she has had 2 episodes over  the past month.  This is actually less than I recall her having  previously.  Dr. Posey Rea had started her on beta blocker (Toprol XL 25  mg twice per day).  She says she feels a little bit better after that.  Chest pain always begins with exertion and typically lasts for 15  minutes or so.   CURRENT MEDICATIONS:  1. Methadone 20 mg twice per day.  2. Indapamide 20 mg per day.  3. Toprol XL 25 mg twice per day.  4. Omeprazole 20 mg per day.  5. Lorazepam 1 mg p.r.n.  6. Ambien 10 mg per day.  7. Nitroglycerin sublingual p.r.n.  8. Hydroxyzine 25 mg per day.  9. Aspirin 81 mg per day.  10.Trimethoprim 100 mg per day.  11.Promethazine 25 mg per day.  12.Lotrel.  13.Nitroglycerin patch 0.6 mg per hour.   ALLERGIES:  PENICILLIN, SULFA, IV CONTRAST, DOXYCYCLINE, CIPROFLOXACIN.   PHYSICAL EXAMINATION:  She is  obese but otherwise well-appearing, in no  distress with heart rate 72, blood pressure 112/70, and weight of 191  pounds.  She has no jugular venous distention, thyromegaly, or lymphadenopathy.  LUNGS:  Clear to auscultation.  She has a nonpalpable point of maximal cardiac impulse.  She has a  regular rate and rhythm without murmur, rub, gallop.  ABDOMEN:  Soft, nondistended, nontender.  There is no  hepatosplenomegaly.  Bowel sounds are normal.  EXTREMITIES:  Warm without edema.   IMPRESSION/RECOMMENDATION:  Chronic stable angina.  Appears well  controlled.  Medication compliance is always somewhat questionable for  her.  Continue current medical regimen.  Follow up in 6 months.     Salvadore Farber, MD  Electronically Signed    WED/MedQ  DD: 05/21/2006  DT: 05/22/2006  Job #: [id number]   cc:   Sandy Quint. Plotnikov, MD

## 2010-09-22 NOTE — Discharge Summary (Signed)
NAME:  [name], [name]                    ACCOUNT NO.:  [id number]   MEDICAL RECORD NO.:  [id number]                   PATIENT TYPE:  INP   LOCATION:  2038                                 FACILITY:  [facility]   PHYSICIAN:  Salvatore Decent. Cornelius Moras, M.D.              DATE OF BIRTH:  [birth year]   DATE OF ADMISSION:  12/30/2002  DATE OF DISCHARGE:  01/08/2003                                 DISCHARGE SUMMARY   ADMITTING DIAGNOSIS:  Acute myocardial infarction.   PAST MEDICAL HISTORY:  1. Hypertension.  2. Hyperlipidemia.  3. On admission, the patient denied any previous history of coronary     disease, stroke, diabetes, renal insufficiency.  4. Does report recurrent urinary tract infections in the past.   SURGICAL HISTORY:  1. Ovarian and uterine fibroid tumor excised.  2. History of fibroadenoma of the left breast.   ALLERGIES:  Sensitive to PENICILLIN; it causes a rash.  NOVOCAIN causes  swelling.   DISCHARGE DIAGNOSIS:  Severe three-vessel coronary artery disease, status  post acute myocardial infarction, status post coronary artery bypass graft.   BRIEF HISTORY:  Ms. 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  She moved to this country approximately five  years ago.  The morning of December 30, 2002, she was awakened from her sleep  with severe substernal chest pain; this radiated to her throat and left arm  and was associated with nausea and vomiting.  She presented to the emergency  department at [facility], where EKG revealed findings consistent  with acute inferior MI.  She was evaluated in the emergency department by  Dr.  Bing.  After examination of Dr. Coolidge Breeze, as well as  reviewing the available records, he recommended proceeding with urgent  cardiac catheterization.  The procedure, risks and benefits of this  procedure were discussed in detail with Dr. Coolidge Breeze and her husband via  a Nurse, learning disability.  She  agreed to proceed with catheterization.   HOSPITAL COURSE:  December 30, 2002, cardiac catheterization by Dr. Salvadore Farber revealed severe three-vessel coronary artery disease with preserved  left ventricular function, ejection fraction estimated to be 60%.  As her  lesions were not amenable to PCI, cardiac surgery consult was requested.  She was evaluated later in the day by Dr. Purcell Nails.  After  examination of the patient and review of the available records, Dr. Cornelius Moras  agreed that proceeding with coronary artery bypass grafting was the  preferred treatment choice for this woman.  Again, the procedure, risks and  benefits were discussed with Dr. Coolidge Breeze, her husband and daughter, all  via an interpreter.  She agreed to proceed with surgery and she was  scheduled for Friday, January 01, 2003.   December 30, 2002, preoperative arterial evaluation included carotid Doppler  studies which revealed no significant carotid artery disease.  Upper  extremity evaluation included an Allen's test; these  were abnormal  bilaterally; next, lower extremity ABIs greater than 1.0 bilaterally.  Dr.  Coolidge Breeze remained stable while waiting for surgery.   January 01, 2003, she underwent the following surgical procedure with Dr.  Tressie Stalker:  Coronary artery bypass grafting x4.  Grafts placed at time  of the procedure:  Left internal mammary artery grafted to the left anterior  descending artery, saphenous vein was grafted to the diagonal artery,  saphenous vein was grafted to the circumflex artery, saphenous vein was  grafted to the posterior descending artery.  Vein was harvested from the  right thigh via the endo-vein harvesting technique through the mid-calf with  an open surgical technique.  She tolerated this procedure reasonable well,  transferring in stable condition to the SICU.  She remained hemodynamically  stable in the immediate postoperative period and she was extubated several   hours after arrival in the intensive care unit.  Postoperative day 1, she  had a brief episode of atrial fibrillation which converted to normal sinus  rhythm with treatment with IV Cardizem.  Cardizem was changed to oral (p.o.)  on postoperative day 2.  She remained in normal sinus rhythm and Cardizem  was discontinued January 04, 2003.  She has maintained normal sinus rhythm  for the remainder of her hospitalization.   She has also had a low-grade fever, max of 102.9 on the evening of  postoperative day 2.  UA was negative, blood cultures negative and sputums  have been negative.  It is felt that her fever is most likely due to  atelectasis.  She has been encouraged to continue to increase her use of  incentive spirometer as well as increasing her mobility.   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  On the morning of January 07, 2003, it is postoperative day 6,  her vital signs are stable with blood pressure 90/60.  She continues to have  a low-grade temperature of 100.  Her room air saturation is 88-90%.  Her  heart is maintaining normal sinus rhythm at 77 beats per minute.  Her lungs  are clear.  She is tolerating a diet.  Her bowel and bladder functions are  within normal limits for her.  Her incisions are all healing well.  There is  no lower extremity edema.  She is ambulating with minimal assistance and her  pain control is adequate.  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  Arrangements will be made with a translator to review  all discharge instructions.   CONDITION ON DISCHARGE:  Condition on discharge is improved.   INSTRUCTIONS ON DISCHARGE:   MEDICATIONS:  1. Aspirin 325 mg p.o. daily.  2. Lopressor 25 mg p.o. b.i.d.  3. Altace 2.5 mg daily, unless her systolic blood pressure remains below     100. 4. Lasix 40 mg daily for  five days.  5. Potassium chloride 20 mEq p.o. daily for five days.  6. Zocor 40 mg p.o. daily.  7. For pain management, she may have Ultram 50 mg one to two p.o. q.6 h.     p.r.n.   ACTIVITY:  She has been asked to refrain from any driving or heavy lifting,  pushing or pulling; also instructed to continue her breathing exercises and  daily walking.   DIET:  Her diet should continue to be a heart-healthy diet.  WOUND CARE:  She is to shower daily with mild soap and water.  If her  incisions are red, hot, swollen or draining or if she has a fever greater  than 101 degrees Fahrenheit, she is to call Dr. Orvan July office.    FOLLOWUP:  1. Dr. Dietrich Pates should see her in his office in approximately two weeks; an     appointment will be made for her prior to her discharge.  2. Dr. Cornelius Moras would like to see her at the [facility] office on Monday, February 08, 2003, at 12:30 p.m.      Toribio Harbour, N.P.                  Salvatore Decent. Cornelius Moras, M.D.    CTK/MEDQ  D:  01/07/2003  T:  01/09/2003  Job:  [id number]   cc:   Salvatore Decent. Cornelius Moras, M.D.  [address]  [place]  [place] [address]  Fax: [phone]   [place] Bing, M.D.   Georgina Quint. Plotnikov, M.D. [facility]   Larey Dresser, M.D.

## 2010-09-22 NOTE — H&P (Signed)
NAME:  [name], [name]                    ACCOUNT NO.:  [id number]   MEDICAL RECORD NO.:  [id number]                   PATIENT TYPE:  INP   LOCATION:  5150                                 FACILITY:  [facility]   PHYSICIAN:  Georgina Quint. Plotnikov, M.D. [facility]      DATE OF BIRTH:  03/03/1928   DATE OF ADMISSION:  10/07/2003  DATE OF DISCHARGE:                                HISTORY & PHYSICAL   CHIEF COMPLAINT:  Abdominal pain, nausea and vomiting, weight loss.   HISTORY OF PRESENT ILLNESS:  The patient is a 77 year old patient with three  months of increasing abdominal pain, weight loss, and nausea and vomiting,  status post recent hospitalization.  During the last hospitalization, she  was treated empirically for presumed diverticulitis with antibiotics which  has not helped the symptoms.  Developed rash when put on Sulfa.  Went home  off antibiotics.  Her abdominal pain seems to be aggravated by meals and  bowel movements.  She has had to take increasing amount of narcotics with no  relief.  Denies blood in the stool, diarrhea.  No chest pain, syncope, or  GERD.   PAST MEDICAL HISTORY:   FAMILY HISTORY:   ALLERGIES:   SOCIAL HISTORY:   REVIEW OF SYSTEMS:  As per Sep 23, 2003, H&P.   CURRENT MEDICATIONS:  None except for Vicodin.   PHYSICAL EXAMINATION:  GENERAL:  She appears chronically ill.  VITAL SIGNS:  Blood pressure 164/84, pulse 68, temperature 98.4, weight 171  pounds (down from 173) during that visit.  HEENT:  Slightly dry oral mucosa.  NECK:  Supple.  LUNGS:  Clear.  No wheezes or rales.  HEART:  S1 and S2.  No murmurs, rubs, or gallops.  ABDOMEN:  Soft.  Nondistended.  Tender in the left half.  No masses felt. No  rebound symptoms.  EXTREMITIES:  Lower extremities without edema.  She is alert, oriented, and  appropriate. Denies being depressed, however, admits to being depressed due  to the situation with her abdominal pain.   LABORATORY DATA:  Recent barium  enema study showed extensive diverticular  disease with muscular thickening in the sigmoid colon and descending colon.  There was no evidence of stricture or mass.   ASSESSMENT:  1. Chronic abdominal pain with worsening symptoms likely due to diverticular     disease.  I do not see any other pathology at this point to be     responsible for her severe symptoms.  Will obtain surgical and GI     consultation.  Obtain lab work including sed rate, LDH, 24-hour urine for     cortisol, urine for heavy metals, repeat CBC and chemistry.  2. Weight loss due to problem #1.  Plan as above.  Will start IV fluids and     clear liquid diet.  3. Nausea and vomiting.  Will use Phenergan p.r.n.  4. Multiple drug allergies.  This makes the management of her problems even  more complicated.  Currently she is off all medicines except for Vicodin.  5. Coronary artery disease.  Would like to restart Toprol XL 25 mg daily.  6. Hypertension.  Plan as above.                                                Georgina Quint. Plotnikov, M.D. [facility]    AVP/MEDQ  D:  10/07/2003  T:  10/07/2003  Job:  [id number]   cc:   Lina Sar, M.D. [facility]   Salvadore Farber, M.D. [facility]  [address]  [place]  [place] [address]

## 2010-09-22 NOTE — Consult Note (Signed)
NAME:  [name], PLANT                    ACCOUNT NO.:  [id number]   MEDICAL RECORD NO.:  [id number]                   PATIENT TYPE:  INP   LOCATION:  3731                                 FACILITY:  [facility]   PHYSICIAN:  Iva Boop, M.D. [facility]           DATE OF BIRTH:  March 14, 1928   DATE OF CONSULTATION:  09/24/2003  DATE OF DISCHARGE:                                   CONSULTATION   REQUESTING PHYSICIAN:  Georgina Quint. Plotnikov, M.D.   REASON FOR CONSULTATION:  Abdominal pain and weight loss.   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  I know her from previous workup.  She had been treated by me for  Blastocystis hominis, and recently, because of weight loss, abdominal pain,  nausea and vomiting, I performed upper GI endoscopy and colonoscopy.  They  showed diverticulosis and a small hiatal hernia.  She had two tiny polyps  that were benign.  She continued to have problems.  CT abdomen and pelvis  was performed which showed diverticulosis without diverticulitis and an  abnormal uterus.  A pelvic ultrasound showed a uterine fibroid.  She does  have an adrenal adenoma.  She had complained of flushing, but urine 5HIAA  studies were normal.  I had asked her to go back to see Dr. Posey Rea for  further evaluation, which she had, and she has had persistent nausea and  vomiting and abdominal pain in the periumbilical area as well as the left  lower quadrant.  The patient periumbilical pain seems somewhat new to me.  She had a CT scan yesterday; the results are not yet in the computer, but I  will check on those.  She denies overt depression, though I thought she  might have had that.   PAST MEDICAL HISTORY:  1. Coronary artery bypass grafting after acute myocardial infarction in     August 2004.  Her symptoms began after that.  2. Hypertension.  3. Dyslipidemia.  4. Recurrent urinary tract infections.  5. As above.   DRUG ALLERGIES:   Sensitivity to beta blocker and previous antibiotic.   SOCIAL HISTORY:  Married, lives with her husband, immigrated here.  No  alcohol or tobacco as best I can tell.  She has a daughter here in town.   REVIEW OF SYSTEMS:  As per HPI.  There has been no fever reported.  It is  clear that she has worsening problems after she eats.  There is a language  barrier that makes it somewhat difficult.  In addition, there numerous drug  intolerances to OXYCONTIN, NOVOCAINE, DOXYCYCLINE, CIPRO, PENICILLIN, SULFA,  DEMEROL, PRAVACHOL, LOVASTATIN, HYAMINE which have all caused flushing  (those last three).  She has lost from 200 down to about 170 pounds in  several months.  She feels very weak.  She tells me her skin turgor is  reduced.   PHYSICAL EXAMINATION:  GENERAL:  Pleasant, overweight  to obese.  No acute  distress.  VITAL SIGNS:  Temperature 98.8, pulse 49, respirations 18, blood pressure  94/48.  HEENT:  Eyes anicteric.  ENT:  Normal mouth and pharynx.  NECK:  Supple, no mass.  CHEST: Clear.  HEART:  Normal S1 and S2.  No gallop or rub.  ABDOMEN:  Soft, diffusely tender, more so in the left lower quadrant with  periumbilical tenderness today as well.  No mass.  EXTREMITIES:  No cyanosis, clubbing, or edema.  NEUROLOGIC:  She appears alert and oriented x 3 as best I can tell.  There  is a language barrier.   LABORATORY DATA:  Sed rate 28.  Hemoglobin 11.9, hematocrit 35.  BMET  normal.  LFTs normal.  Albumin 3.1.  Troponin negative.  CK-MB negative.  UA  negative.   IMPRESSION:  Abdominal pain, weight loss, unclear etiology.  I had thought  she might have some depression before.  She has an adrenal adenoma and a  uterine fibroid, neither of which I think are causing problems.  She does  have severe diverticulosis.  On occasion, I have seen patients with  diverticulitis that does not show on CT scan or colonoscopy and respond only  to resection of the colon.  She is asking for a  laparoscopy or a laparotomy.  I think other considerations would be mesenteric ischemia, and I think that  ought to be excluded.  It is a bit atypical for that in that she has pain  outside of eating, but she clearly describes exacerbation of symptoms with  eating, and weight loss is present.  Further plans pending that evaluation.   I appreciate the opportunity to care for this patient.                                               Iva Boop, M.D. [facility]    CEG/MEDQ  D:  09/24/2003  T:  09/26/2003  Job:  [id number]   cc:   Georgina Quint. Plotnikov, M.D. [facility]

## 2010-09-22 NOTE — Op Note (Signed)
NAME:  Sandy, Young                    ACCOUNT NO.:  [id number]   MEDICAL RECORD NO.:  [id number]                   PATIENT TYPE:  INP   LOCATION:  5741                                 FACILITY:  [facility]   PHYSICIAN:  Jimmye Norman III, M.D.               DATE OF BIRTH:  [birth year]   DATE OF PROCEDURE:  10/21/2003  DATE OF DISCHARGE:                                 OPERATIVE REPORT   PREOPERATIVE DIAGNOSIS:  Severe sigmoid and left colonic diverticulosis.   POSTOPERATIVE DIAGNOSIS:  Severe sigmoid and left colonic diverticulosis  with omental and small bowel adhesion in the midline abdomen.   PROCEDURE:  1. Exploratory laparotomy.  2. Left hemicolectomy.  3. Enterolysis.   SURGEON:  Jimmye Norman, M.D.   ASSISTANT:  Anselm Pancoast. Zachery Dakins, M.D.   ANESTHESIA:  General endotracheal anesthesia.   ESTIMATED BLOOD LOSS:  150 mL.   COMPLICATIONS:  None.   CONDITION:  Good.   INDICATIONS FOR PROCEDURE:  The patient is a 77 year old female with a  history of severe pelvic pain and diverticulosis noted in the entire left  colon including the sigmoid.  She comes in now for an elective left  hemicolectomy.   FINDINGS:  The patient had extensive diverticulosis extending up to the  splenic flexure.  She also had extensive omental adhesions to the anterior  abdominal wall down into the pelvis.  The splenic flexure appeared to be  very tight and difficult to mobilize.  The small bowel, the liver, the  spleen, and the gallbladder were all completely normal.   OPERATION:  The patient was taken to the operating room and placed on the  table in supine position.  After an adequate endotracheal anesthetic was  administered, she was prepped and draped in the usual sterile manner  exposing the midline of the abdomen.  The patient had a previous history of  a lower abdominal procedure including a fibroid resection.  We made a  midline incision from just above the umbilicus to down below it  using a #10  blade and then we extended it proximally because of the need to mobilize the  splenic flexure later on in the case.  Once we got in the initial incision,  we took it down to and through the midline fascia using electrocautery.  We  opened the peritoneal cavity.  It was noted that on the left side of the  abdomen and the right side, there were significant adhesions to the midline  incision.  These were taken down using electrocautery.  Once we did this, we  were able to see the redundant diverticular colon which we ultimately  resected.  The proximal rectum and distal sigmoid were free of diverticular  disease.  We mobilized the sigmoid colon and the left colon along the line  of Toldt.  It was done in a way that we were able to visualize the left  ureter very well and also  we could separate the colon from the lateral walls  with minimal difficulty.  We took it all the way up to the splenic flexure  and subsequently went to the transverse colon and took the omentum off the  transverse colon and then mobilized that towards the splenic flexure.  There  was a small capsular tear in the spleen with this procedure, however, this  was easily controlled with pressure and cauterization.   We were able to take down the splenic flexure using electrocautery and  subsequently transect the colon in the distal transverse colon.  This was  done using an Allen clamp, a spring clamp, and a knife.  Distally at the  rectum sigmoid colon junction, the colon was transected using the GIA-55  stapler.  The intervening mesentery was dissected using Kelly clamps and 2-0  silk ties.  We visualized the ureter and saw that it was well posterior to  our line of resection.  Once we had taken all blood vessels with 2-0 silk  ties, the colon was sent out as a specimen.  We copiously irrigated with  saline after we changed out gloves.  Once this was removed, we did a primary  anastomosis between the mid distal  transversae colon and the distal sigmoid  and rectum.  This was a handsewn anastomosis done in two layers, outside  layer of 3-0 silk Lambert sutures and the full thickness with 3-0 Vicryl.  Once this was done and completed, we closed off the mesentery using  interrupted sutures of 3-0 silk.  We copiously irrigated with saline.  We  closed the abdomen.  All needle counts were correct as were the sponge  counts.  The skin was closed using running #1 PDS suture and the skin was  closed using stainless steel staples.  All needle counts, sponge counts, and  instrument counts were correct.  The patient was taken to the recovery room  in stable condition.                                               Kathrin Ruddy, M.D.    JW/MEDQ  D:  10/21/2003  T:  10/21/2003  Job:  [id number]

## 2010-09-22 NOTE — Consult Note (Signed)
NAME:  [name], [name]                    ACCOUNT NO.:  [id number]   MEDICAL RECORD NO.:  [id number]                   PATIENT TYPE:  INP   LOCATION:  5150                                 FACILITY:  [facility]   PHYSICIAN:  Jimmye Norman III, M.D.               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  She had  a recent hospitalization for this part and was treated empirically for  diverticulitis, but it has not helped her symptoms.  She developed a rash  from the antibiotic treatment and was readmitted on October 07, 2003, with  continued lower abdominal pain.  Apparently she has a history of uterine  fibroids also which might be part of her symptom complex.   I examined the patient and saw her yesterday on October 07, 2003, at which time  she was found to have some mild lower abdominal pain, but no rebound or  guarding.  She was afebrile and her white blood cell count was normal.  I  reviewed her barium air contrast enema, which demonstrated extensive  diverticulosis along the left side, but no evidence of obstruction or masses  or anything to indicate active diverticulitis.   I spoke with Cornell Barman, P.A., for [facility], who indicated to me that a  workup for uterine fibroids had been suggested and should take place prior  to surgical intervention for her diverticular disease.  I do not think that  it is unreasonable to go ahead and do a colectomy with her extensive  disease, however, with possible symptomatic fibroids in place, she could  continue to have her pain post colectomy and have to undergo a second  procedure.  With this being in mind, I would suggest that an OB/GYN  consultation be obtained prior to surgical intervention for colon disease  and consideration of combined hysterectomy along with a sigmoid or a left  colectomy be made.  In this way, both possible causes of upper and lower  abdominal pain would be treated at the same time.                                               Kathrin Ruddy, M.D.    JW/MEDQ  D:  10/08/2003  T:  10/09/2003  Job:  [id number]   cc:   Iva Boop, M.D. [facility]

## 2010-09-22 NOTE — Discharge Summary (Signed)
NAME:  [name], [name]                    ACCOUNT NO.:  [id number]   MEDICAL RECORD NO.:  [id number]                   PATIENT TYPE:  INP   LOCATION:  5150                                 FACILITY:  [facility]   PHYSICIAN:  Rosalyn Gess. Norins, M.D. [facility]         DATE OF BIRTH:  [birth year]   DATE OF ADMISSION:  10/07/2003  DATE OF DISCHARGE:  10/08/2003                           DISCHARGE SUMMARY - REFERRING   DISCHARGE DIAGNOSES:  1. Chronic abdominal pain.  2. Anorexia.  3. Weight loss.  4. Uterine fibroids.  5. Hypertension.  6. Coronary artery disease.   HISTORY OF PRESENT ILLNESS:  The patient is a 77 year old [name] female who  is well known to this service.  She was most recently hospitalized on Sep 23, 2003, through Sep 28, 2003, with abdominal pain.  She was seen by her  primary care physician on October 07, 2003.  He described a three-month-history  of increasing abdominal pain, weight loss, nausea and vomiting.  He noted  that her abdominal pain appears to be aggravated by meals and by bowel  movements.  She does have extensive diverticular disease.  A workup during  her previous hospitalization also revealed uterine fibroids.  The patient  denied any melena or diarrhea.  She denied any chest pain, syncope, or  gastroesophageal reflux disease.   PAST MEDICAL HISTORY:  1. Coronary artery disease, status post coronary artery bypass graft surgery     after an acute inferior myocardial infarction in August 2004.  2. Diverticulosis.  3. Status post endoscopy in 2005, revealing a hiatal herniae.  4. Status post colonoscopy in 2005, confirming diverticulosis.  5. Uterine fibroids diagnosed by a pelvic ultrasound.  6. Adrenal adenoma found on pelvic ultrasound, a 5HIAA was normal.  7. Hypertension.  8. Dyslipidemia.  9. Multiple drug intolerances.  10.      Fibroadenomas of the left breast.   HOSPITAL COURSE:  #1 - GASTROINTESTINAL:  As noted, the patient has  progressive  ongoing abdominal pain.  The patient was hospitalized in May of  2005, with onset of pain.  As noted, she had an endoscopy and a colonoscopy  during that admission, revealing a hiatal hernia and diverticulosis.  A CT  of the abdomen and pelvis revealed diverticulosis and abnormal uterus.  This  prompted a pelvic ultrasound, which revealed uterine fibroids and an adrenal  adenoma.  This prompted checking a 5HIAA, which was normal.  She had a CT  angiogram during that admission that was negative for mesenteric ischemia.  She also had a small bowel follow-through which was normal.  The etiology of  her pain was not clear.  She was set up for an outpatient barium enema.  This was done, and again confirmed diverticular disease.  The patient was  readmitted at this time for a surgical evaluation.  She was seen by Dr.  Jimmye Norman, who reviewed her studies.  He did feel that she had extensive  diverticular  disease, and may benefit from a colectomy; however, he was not  convinced that her uterine fibroids were also contributing.  Ideally, he  would like to pursue a colectomy and a hysterectomy at the same time.  Therefore arrangements have been made for the patient to be discharged, and  follow up with Dr. Rudy Jew. Ashley Royalty with gynecology.   DISCHARGE LABORATORY DATA:  BUN 7, creatinine 1.1, glucose 105.  Sedimentation rate 14, lipase 20, hepatic function profile was normal except  for a total bilirubin of 1.3 and a direct bilirubin of 0.4.  LDH was 263.  CBC with differential was normal.  A 24-hour urine for heavy metal is  pending.  A free cortisol is also pending.   DISCHARGE MEDICATIONS:  Toprol XL 25 mg daily.   FOLLOWUP:  She is scheduled to follow up with Dr. Rudy Jew. Ashley Royalty on  Monday, October 11, 2003, at 12:15 p.m., and with Dr. Jimmye Norman on Tuesday,  October 12, 2003, at 10:30 a.m.      Cornell Barman, P.A. [facility]                  Michael E. Norins, M.D. [facility]    LC/MEDQ  D:   10/08/2003  T:  10/08/2003  Job:  [id number]

## 2010-09-22 NOTE — Assessment & Plan Note (Signed)
[facility]                            CARDIOLOGY OFFICE NOTE   Sandy, Young                 MRN:          [id number]  DATE:08/15/2006                            DOB:          December 08, 1927    PRIMARY CARE PHYSICIAN:  [name].   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  She suffered an  inferior myocardial infarction in 2004.  I opened a right coronary  emergently and then sent her for CABG for revascularization of the  remaining vessels.  Her EF is normal.  Ever since then, she has been  troubled by chest discomfort that is probably chronic stable angina.  An  adenosine Cardiolite in May of 2007 did not, however, demonstrate any  ischemia.  We have, therefore, managed things medically with sometimes  good control of her symptoms and sometimes less.  Given her multiple  chronic pains, I have never been entirely clear that her chest pain is  angina.   CURRENT MEDICATIONS:  1. Methadone 20 mg twice daily.  2. Indapamide 25 mg daily.  3. Lorazepam 1 mg p.r.n.  4. NitroQuick.  5. Hydroxyzine 25 mg daily.  6. Aspirin 81 mg daily.  7. Promethazine 25 mg daily.  8. Nitro patch 0.6 mg per hour.  9. Metoprolol succinate 25 mg daily.   PHYSICAL EXAM:  She is overweight, but otherwise well-appearing, in no  distress.  Heart rate 65, blood pressure 100/60, and weight of 185 pounds.  She has no jugular venous distension, thyromegaly, or lymphadenopathy.  LUNGS:  Clear to auscultation.  She has a nondisplaced point of maximal impulse.  There is a regular  rate and rhythm without murmur, rub, or gallop.  ABDOMEN:  Soft, nondistended, nontender.  There is no  hepatosplenomegaly.  Bowel sounds are normal.  EXTREMITIES:  Warm without clubbing, cyanosis, edema, or ulceration.  Carotid pulses are 2+ bilaterally without bruit.   ELECTROCARDIOGRAM:   Demonstrates normal sinus rhythm with inferior  infarct and nonspecific ST-T wave abnormalities.   IMPRESSION/RECOMMENDATIONS:  1. Coronary disease, remarkably asymptomatic over the past month.      Will, therefore, continue aspirin, beta blocker, and topical      nitrate.  2. Prior myocardial infarction.  3. Followup.  We will ask her to follow up with Dr. Myrtis Young in 6 months'      time.  I have also referred her husband, [name], to Dr. Myrtis Young.     Sandy Farber, MD  Electronically Signed    [name]  DD: 08/15/2006  DT: 08/15/2006  Job #: [id number]

## 2010-09-22 NOTE — Consult Note (Signed)
NAME:  Sandy, Young                    ACCOUNT NO.:  [id number]   MEDICAL RECORD NO.:  [id number]                   PATIENT TYPE:  INP   LOCATION:  2925                                 FACILITY:  [facility]   PHYSICIAN:  Salvatore Decent. Cornelius Moras, M.D.              DATE OF BIRTH:  1927/10/03   DATE OF CONSULTATION:  12/30/2002  DATE OF DISCHARGE:                                   CONSULTATION   REQUESTING PHYSICIAN:  Salvadore Farber, M.D.   PRIMARY CARE PHYSICIAN:  Georgina Quint. Plotnikov, M.D. [facility]   REASON FOR CONSULTATION:  Severe three vessel coronary artery disease status  post acute myocardial infarction.   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  She has no previous cardiac history, but risk factors  including history of hypertension and hyperlipidemia.  She describes a two  month history of new onset symptoms of exertional angina.  Early this  morning she was awoke from her sleep with severe substernal chest pain  radiating to her throat and left arm associated with nausea and vomiting.  She promptly presented to the emergency room where EKG revealed findings  consistent with acute inferior myocardial infarction.  She was brought  directly to the cardiac catheterization laboratory by Salvadore Farber,  M.D. where catheterization demonstrated severe three vessel coronary artery  disease as well as occlusion of the mid right coronary artery.  Left  ventricular function was reasonably well preserved with ejection fraction  estimated 60% with inferior akinesis.  There was no mitral regurgitation.  The patient underwent angioplasty of the right coronary artery with  restoration of TIMI 3 flow.  However, there was severe residual disease in  the distal right coronary artery as well as severe high grade obstruction  involving the left anterior descending  coronary artery and the left  circumflex vessel.  The patient had continued chest pain in the  catheterization laboratory prompting placement of intraaortic balloon pump.  Since then she has remained entirely stable and without any symptoms of  chest pain.  Cardiac surgical consultation has been requested.   REVIEW OF SYSTEMS:  Obtained through an interpreter Cecille Rubin) and is  notable for the following:  GENERAL:  The patient reports feeling well  otherwise with exception of chronic bowel complaints.  She has good appetite  and has not been losing weight.  CARDIAC:  The patient reports exertional  substernal chest tightness and pressure that developed within the last one  to two months.  This has always been associated with physical activity and  relieved by rest up until last night.  She denies any shortness of breath  other than with activity or at rest.  She reports no history of PND,  orthopnea, nor lower extremity edema.  She denies any palpitations or  syncope.  RESPIRATORY:  Negative.  The patient denies productive cough or  shortness of breath.  INFECTIOUS:  Negative.  The patient denies recent  fevers.  GASTROINTESTINAL:  Notable for chronic intermittent abdominal  discomfort which the patient attributes to history of diverticulosis.  She  intermittently takes Senna for constipation and Imodium for diarrhea.  She  reports no unusual problems recently and she specifically denies any history  of hematemesis, hematochezia, or melena.  MUSCULOSKELETAL:  Negative.  The  patient ambulates without difficulty.  NEUROLOGIC:  Negative.  The patient  denies symptoms suggestive of previous stroke or TIA.  ENDOCRINE:  Negative.  The patient denies known history of diabetes.   PAST MEDICAL HISTORY:  1. Hypertension.  2. Hyperlipidemia.  3. The patient denies any known previous history of coronary artery disease,     stroke, or diabetes.  4. She is not aware of any history of renal  insufficiency, although baseline     serum creatinine 1.5 at time of admission.  5. The patient does report history of recurrent urinary tract infections in     the past.   PAST SURGICAL HISTORY:  Ovarian and uterine fibroid tumors that were removed  as well as history of fibroadenoma of the left breast.   FAMILY HISTORY:  Negative.  Notable for the absence of early onset coronary  artery disease.   SOCIAL HISTORY:  The patient is married and lives with her husband and  daughter in [place].  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  She is a nonsmoker.   MEDICATIONS PRIOR TO ADMISSION:  1. Indapamide for hypertension.  2. Vitamin C supplement.   ALLERGIES:  The patient reports sensitivities to PENICILLIN that causes rash  and NOVOCAINE which causes swelling.   PHYSICAL EXAMINATION:  GENERAL:  Obese, but well-appearing white female who  appears her stated age in no acute distress.  She is currently flat in bed  in the intensive care unit with intraaortic balloon pump through the right  femoral artery position.  VITAL SIGNS:  She is in normal sinus rhythm and is currently normotensive  and afebrile.  HEENT:  Grossly unrevealing.  NECK:  Supple.  There is no cervical or supraclavicular lymphadenopathy.  CHEST:  Auscultation of the chest demonstrates clear and symmetrical breath  sounds bilaterally.  No wheezes or rhonchi are noted.  CARDIOVASCULAR:  Regular rate and rhythm.  No murmurs, rubs, or gallops are  appreciated.  There is an old scar on the left anterior chest from previous  breast biopsy.  ABDOMEN:  Obese, but soft and nontender.  Bowel sounds are present.  EXTREMITIES:  Warm and well perfused.  There is no lower extremity edema.  Distal pulses are palpable in both lower legs at the ankles.  There is no  sign of venous insufficiency or thrombophlebitis.  RECTAL:  Deferred.  GENITOURINARY:  Deferred. NEUROLOGIC:   Grossly nonfocal.   DIAGNOSTIC TESTS:  Cardiac catheterization performed early this morning by  Salvadore Farber, M.D. has been reviewed.  This demonstrates severe three  vessel coronary artery disease including 70% proximal stenosis of left  anterior descending coronary artery followed by 80% stenosis of the mid left  anterior descending coronary artery.  There is 90% proximal stenosis of a  medium sized first diagonal branch.  There is 90% proximal stenosis of the  left circumflex coronary artery which gives rise to a single large  circumflex marginal branch.  There is  100% occlusion of the mid right  coronary artery.  Angioplasty was performed restoring normal TIMI 3  antegrade flow to the vessel.  There is diffuse distal disease in the distal  right coronary artery.  The posterior descending coronary artery appears to  be fairly large and graftable target for bypass grafting.  There is right  dominant coronary circulation.  Left ventricular function is notable for  inferior wall akinesis but overall global function is reasonably well  preserved with ejection fraction estimated 60%.   Baseline laboratory data are notable for hemoglobin 12.9, hematocrit 38%,  platelet count 220,000, white blood count 8000 prior to catheterization.  Baseline serum creatinine is 1.5.   IMPRESSION:  Severe three vessel coronary artery disease status post acute  myocardial infarction with interruption of the acute infarction and  restoration of normal antegrade flow via angioplasty of the culprit vessel.  The patient has severe residual three vessel coronary artery disease and  will benefit from elective coronary artery bypass grafting.   PLAN:  I have outlined options at length with Dr. Coolidge Breeze, her husband,  and daughter communicating through an interpreter.  They understand and  accept all associated risks of surgery including, but not limited to, risk  of death, stroke, myocardial infarction,  congestive heart failure,  respiratory failure, pneumonia, acute renal failure, bleeding requiring  blood transfusion, arrhythmia, infection, and recurrent coronary artery  disease.  All their questions have been addressed.  We tentatively plan to  proceed with surgery first case Friday, August 27.                                               Salvatore Decent. Cornelius Moras, M.D.    CHO/MEDQ  D:  12/30/2002  T:  12/31/2002  Job:  [id number]   cc:   Salvadore Farber, M.D.   Georgina Quint. Plotnikov, M.D. [facility]

## 2010-09-22 NOTE — Assessment & Plan Note (Signed)
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  We have evaluated her now  for the past 2 years with CT scan of the abdomen, small bowel follow  through.  She underwent left hemicolectomy for diverticulitis in June  2005.  She also had a last colonoscopy in January 2007 with findings of  adenomatous polyp, but essentially normal left colon anatomy and normal  appearing anastomosis.  She is dependent on narcotics  and with only  partial  relief her the pain.  The pain is  continuous, worse when she  is on her feet.  It is partly relieved by taking an enema or evacuating  her colon..  She does take laxatives because the narcotics make her  constipated.  She is currently on Methadone 10 mg 6-8 tablets a day and  Fentanyl patch 50 mcg every 3 days.  She feels that she needs stronger  analgesic.  Other medications include  NitroQuick 0.4 mg, Hydroxyzine 25  mg p.r.n., aspirin 81 mg p.o. daily, Trimethoprim 100 mg p.o. daily,  Promethazine 25 mg p.r.n. and Nitro patch 0.6 mg, Methadone 10 mg 6-8  tablets a day, 2 1/2 at a time, Lorazepam 1 mg p.r.n.   PHYSICAL EXAMINATION:  Blood pressure 122/82, pulse 52, weight 190  pounds.  The patient was overweight.  LUNGS:  Clear to auscultation.  HEART:  Normal S1, S2.  ABDOMEN:  Soft with a well healed surgical scar of decreased muscle  tone, very tender in the left lower quadrant, but no rebound, no  palpable mass, right lower quadrant was normal, suprapubic area was  normal, upper abdomen epigastrium was normal.  RECTAL:  Not done today.   IMPRESSION:  A 77 year old white female with chronic pelvic and rectal  pain as well as sigmoid colon pain  with normal exam of the left colon.  Last CT scan  July 2006 showed some stranding around the left colon most  likely related to scar tissue from the surgery. I think she has a  chronic pain syndrome and  dependency on narcotics.   PLAN:  I would like another opinion as to her pelvic pain .I would like  to refer her  to GI motility clinic at  [facility].  She may need rectal manometry, also possibly a bio feedback  and a team approach to her pelvic pain.  Today, I will switch her from  Methadone to Dilaudid 2 mg twice a day in addition to the Fentanyl  patch.  I would like to repeat a CT scan of the pelvis since it has been  2 years, but she is really hesitant to that since she claims to have  been allergic to IV contrast.     Hedwig Morton. Juanda Chance, MD  Electronically Signed   DMB/MedQ  DD: 08/29/2006  DT: 08/29/2006  Job #: [id number]   cc:   Georgina Quint. Plotnikov, MD

## 2010-10-03 ENCOUNTER — Other Ambulatory Visit: Payer: Self-pay | Admitting: Internal Medicine

## 2010-10-06 ENCOUNTER — Other Ambulatory Visit: Payer: Self-pay | Admitting: Internal Medicine

## 2010-10-25 ENCOUNTER — Other Ambulatory Visit (INDEPENDENT_AMBULATORY_CARE_PROVIDER_SITE_OTHER): Payer: Medicare Other

## 2010-10-25 ENCOUNTER — Encounter: Payer: Self-pay | Admitting: Internal Medicine

## 2010-10-25 ENCOUNTER — Ambulatory Visit (INDEPENDENT_AMBULATORY_CARE_PROVIDER_SITE_OTHER): Payer: Medicare Other | Admitting: Internal Medicine

## 2010-10-25 ENCOUNTER — Telehealth: Payer: Self-pay | Admitting: Internal Medicine

## 2010-10-25 DIAGNOSIS — R5381 Other malaise: Secondary | ICD-10-CM

## 2010-10-25 DIAGNOSIS — R109 Unspecified abdominal pain: Secondary | ICD-10-CM

## 2010-10-25 DIAGNOSIS — R5383 Other fatigue: Secondary | ICD-10-CM

## 2010-10-25 DIAGNOSIS — E538 Deficiency of other specified B group vitamins: Secondary | ICD-10-CM

## 2010-10-25 DIAGNOSIS — R0789 Other chest pain: Secondary | ICD-10-CM

## 2010-10-25 LAB — BASIC METABOLIC PANEL
CO2: 29 mEq/L (ref 19–32)
Chloride: 101 mEq/L (ref 96–112)
Glucose, Bld: 98 mg/dL (ref 70–99)
Potassium: 4.3 mEq/L (ref 3.5–5.1)
Sodium: 135 mEq/L (ref 135–145)

## 2010-10-25 LAB — COMPREHENSIVE METABOLIC PANEL
Albumin: 3.6 g/dL (ref 3.5–5.2)
BUN: 18 mg/dL (ref 6–23)
CO2: 29 mEq/L (ref 19–32)
Calcium: 9 mg/dL (ref 8.4–10.5)
Chloride: 101 mEq/L (ref 96–112)
Glucose, Bld: 98 mg/dL (ref 70–99)
Potassium: 4.3 mEq/L (ref 3.5–5.1)
Total Protein: 5.9 g/dL — ABNORMAL LOW (ref 6.0–8.3)

## 2010-10-25 LAB — VITAMIN B12: Vitamin B-12: 432 pg/mL (ref 211–911)

## 2010-10-25 MED ORDER — METHADONE HCL 10 MG PO TABS: 10.0000 mg | ORAL_TABLET | Freq: Every day | ORAL | Status: DC | PRN

## 2010-10-25 MED ORDER — ZOLPIDEM TARTRATE 10 MG PO TABS: 10.0000 mg | ORAL_TABLET | Freq: Every evening | ORAL | Status: DC | PRN

## 2010-10-25 MED ORDER — METOPROLOL SUCCINATE ER 25 MG PO TB24: 25.0000 mg | ORAL_TABLET | Freq: Two times a day (BID) | ORAL | Status: DC

## 2010-10-25 MED ORDER — DIVALPROEX SODIUM 250 MG PO DR TAB: 250.0000 mg | DELAYED_RELEASE_TABLET | Freq: Two times a day (BID) | ORAL | Status: DC

## 2010-10-25 MED ORDER — DIPHENHYDRAMINE HCL 25 MG PO CAPS: 25.0000 mg | ORAL_CAPSULE | Freq: Four times a day (QID) | ORAL | Status: DC | PRN

## 2010-10-25 NOTE — Telephone Encounter (Signed)
Stacey , please, inform the patient: labs are OK   Please, keep  next office visit appointment.   Thank you !   

## 2010-10-25 NOTE — Progress Notes (Signed)
  Subjective:    Patient ID: [name], female    DOB: 02-12-1928, 77 y.o.   MRN: [id number]  HPI    The patient is here to follow up on chronic depression, anxiety, chronic severe abd pain up to 10/10 at times, breast cancer, UTIs and chronic mild angina symptoms, compulsive enema use. She stopped Depakote for ? reason...   Review of Systems  Constitutional: Positive for diaphoresis and fatigue. Negative for chills, activity change and unexpected weight change.  HENT: Negative for drooling, mouth sores, neck pain, postnasal drip and ear discharge.   Eyes: Negative for photophobia and redness.  Respiratory: Negative for cough, shortness of breath and wheezing.   Cardiovascular: Negative for chest pain and leg swelling.  Gastrointestinal: Positive for nausea, abdominal pain, diarrhea, constipation and abdominal distention. Negative for blood in stool.  Genitourinary: Positive for pelvic pain. Negative for dysuria.  Musculoskeletal: Negative for gait problem.  Neurological: Negative for facial asymmetry and speech difficulty.  Psychiatric/Behavioral: Positive for behavioral problems, confusion, sleep disturbance and dysphoric mood. Negative for suicidal ideas, hallucinations, self-injury and agitation. The patient is nervous/anxious.        Objective:   Physical Exam  Constitutional: She is oriented to person, place, and time. She appears well-developed. No distress.  HENT:  Head: Normocephalic.  Eyes: Pupils are equal, round, and reactive to light. Right eye exhibits no discharge. Left eye exhibits no discharge. No scleral icterus.  Neck: No JVD present. No thyromegaly present.  Cardiovascular: Normal rate.  Exam reveals no gallop and no friction rub.   Murmur heard. Pulmonary/Chest: No stridor. She has no wheezes.  Abdominal: She exhibits no distension and no mass. There is tenderness. There is no guarding.  Genitourinary: Guaiac negative stool.  Musculoskeletal: She  exhibits no edema and no tenderness.  Neurological: She is alert and oriented to person, place, and time. She has normal reflexes. She displays normal reflexes. No cranial nerve deficit. Coordination normal.  Skin: She is not diaphoretic. No erythema. No pallor.  Psychiatric: Her behavior is normal. Thought content normal.       Flat affect          Assessment & Plan:  ABDOMINAL PAIN, CHRONIC  Cont Rx as before. Chronic pain syndrome.  Potential benefits of a long term opioids use as well as potential risks (i.e. addiction risk, apnea etc) and complications (i.e. Somnolence, constipation and others) were explained to the patient and were aknowledged. It is a very complex case. Her chart, data from recent tests were reviewed. Meds refilled.  Potential benefits of a long term opioids use as well as potential risks (i.e. addiction risk, apnea etc) and complications (i.e. Somnolence, constipation and others) were explained to the patient and were aknowledged.   DEPRESSION Cont Rx  GERD On Rx  HYPERLIPIDEMIA Cont Rx  SYNCOPE  She had it once and was hospitalized  UTI  On Gentamicin  CAD  Occasional angina  A complex case. Hospital records reviewed.

## 2010-10-26 NOTE — Telephone Encounter (Signed)
Pt's husband informed.

## 2010-10-29 ENCOUNTER — Encounter: Payer: Self-pay | Admitting: Internal Medicine

## 2010-11-24 ENCOUNTER — Telehealth: Payer: Self-pay | Admitting: *Deleted

## 2010-11-24 NOTE — Telephone Encounter (Signed)
Faxed home health referral to fax number [phone] attn: Ripley Fraise per the pt's daughter requests.

## 2010-12-06 ENCOUNTER — Other Ambulatory Visit: Payer: Self-pay | Admitting: Internal Medicine

## 2010-12-20 DIAGNOSIS — Z0279 Encounter for issue of other medical certificate: Secondary | ICD-10-CM

## 2010-12-25 ENCOUNTER — Emergency Department (HOSPITAL_COMMUNITY): Payer: Medicare Other

## 2010-12-25 ENCOUNTER — Inpatient Hospital Stay (HOSPITAL_COMMUNITY)
Admission: EM | Admit: 2010-12-25 | Discharge: 2011-01-03 | DRG: 163 | Disposition: A | Discharge status: Skilled Nursing Facility | Payer: Medicare Other | Attending: Internal Medicine | Admitting: Internal Medicine

## 2010-12-25 DIAGNOSIS — N760 Acute vaginitis: Secondary | ICD-10-CM | POA: Diagnosis not present

## 2010-12-25 DIAGNOSIS — B961 Klebsiella pneumoniae [K. pneumoniae] as the cause of diseases classified elsewhere: Secondary | ICD-10-CM | POA: Diagnosis present

## 2010-12-25 DIAGNOSIS — J90 Pleural effusion, not elsewhere classified: Secondary | ICD-10-CM | POA: Diagnosis present

## 2010-12-25 DIAGNOSIS — N390 Urinary tract infection, site not specified: Secondary | ICD-10-CM | POA: Diagnosis present

## 2010-12-25 DIAGNOSIS — I2510 Atherosclerotic heart disease of native coronary artery without angina pectoris: Secondary | ICD-10-CM

## 2010-12-25 DIAGNOSIS — S2239XA Fracture of one rib, unspecified side, initial encounter for closed fracture: Secondary | ICD-10-CM | POA: Diagnosis present

## 2010-12-25 DIAGNOSIS — W19XXXA Unspecified fall, initial encounter: Secondary | ICD-10-CM

## 2010-12-25 DIAGNOSIS — S271XXA Traumatic hemothorax, initial encounter: Secondary | ICD-10-CM

## 2010-12-25 DIAGNOSIS — K5900 Constipation, unspecified: Secondary | ICD-10-CM | POA: Diagnosis present

## 2010-12-25 DIAGNOSIS — I252 Old myocardial infarction: Secondary | ICD-10-CM

## 2010-12-25 DIAGNOSIS — Z853 Personal history of malignant neoplasm of breast: Secondary | ICD-10-CM

## 2010-12-25 DIAGNOSIS — R0902 Hypoxemia: Secondary | ICD-10-CM

## 2010-12-25 DIAGNOSIS — R5381 Other malaise: Secondary | ICD-10-CM | POA: Diagnosis present

## 2010-12-25 DIAGNOSIS — I059 Rheumatic mitral valve disease, unspecified: Secondary | ICD-10-CM

## 2010-12-25 DIAGNOSIS — J189 Pneumonia, unspecified organism: Secondary | ICD-10-CM | POA: Diagnosis present

## 2010-12-25 DIAGNOSIS — E871 Hypo-osmolality and hyponatremia: Secondary | ICD-10-CM | POA: Diagnosis present

## 2010-12-25 DIAGNOSIS — Z951 Presence of aortocoronary bypass graft: Secondary | ICD-10-CM

## 2010-12-25 DIAGNOSIS — I6789 Other cerebrovascular disease: Secondary | ICD-10-CM | POA: Diagnosis not present

## 2010-12-25 DIAGNOSIS — D62 Acute posthemorrhagic anemia: Secondary | ICD-10-CM | POA: Diagnosis not present

## 2010-12-25 DIAGNOSIS — J9383 Other pneumothorax: Principal | ICD-10-CM | POA: Diagnosis present

## 2010-12-25 DIAGNOSIS — J9600 Acute respiratory failure, unspecified whether with hypoxia or hypercapnia: Secondary | ICD-10-CM | POA: Diagnosis present

## 2010-12-25 DIAGNOSIS — J309 Allergic rhinitis, unspecified: Secondary | ICD-10-CM | POA: Diagnosis present

## 2010-12-25 DIAGNOSIS — Z01810 Encounter for preprocedural cardiovascular examination: Secondary | ICD-10-CM

## 2010-12-25 DIAGNOSIS — Z87440 Personal history of urinary (tract) infections: Secondary | ICD-10-CM

## 2010-12-25 LAB — CBC
HCT: 22.3 % — ABNORMAL LOW (ref 36.0–46.0)
Hemoglobin: 7.8 g/dL — ABNORMAL LOW (ref 12.0–15.0)
Hemoglobin: 8.1 g/dL — ABNORMAL LOW (ref 12.0–15.0)
MCH: 31.7 pg (ref 26.0–34.0)
MCHC: 35 g/dL (ref 30.0–36.0)
Platelets: 334 10*3/uL (ref 150–400)
RBC: 2.54 MIL/uL — ABNORMAL LOW (ref 3.87–5.11)
WBC: 10.2 10*3/uL (ref 4.0–10.5)

## 2010-12-25 LAB — BASIC METABOLIC PANEL
BUN: 15 mg/dL (ref 6–23)
CO2: 25 mEq/L (ref 19–32)
Calcium: 8.3 mg/dL — ABNORMAL LOW (ref 8.4–10.5)
Glucose, Bld: 151 mg/dL — ABNORMAL HIGH (ref 70–99)
Sodium: 125 mEq/L — ABNORMAL LOW (ref 135–145)

## 2010-12-25 LAB — COMPREHENSIVE METABOLIC PANEL
ALT: 12 U/L (ref 0–35)
AST: 17 U/L (ref 0–37)
AST: 16 U/L (ref 0–37)
Albumin: 2.6 g/dL — ABNORMAL LOW (ref 3.5–5.2)
Albumin: 2.8 g/dL — ABNORMAL LOW (ref 3.5–5.2)
Alkaline Phosphatase: 125 U/L — ABNORMAL HIGH (ref 39–117)
CO2: 28 mEq/L (ref 19–32)
Calcium: 8.5 mg/dL (ref 8.4–10.5)
Chloride: 89 mEq/L — ABNORMAL LOW (ref 96–112)
Chloride: 88 mEq/L — ABNORMAL LOW (ref 96–112)
Creatinine, Ser: 1.11 mg/dL — ABNORMAL HIGH (ref 0.50–1.10)
Glucose, Bld: 150 mg/dL — ABNORMAL HIGH (ref 70–99)
Potassium: 4.7 mEq/L (ref 3.5–5.1)
Potassium: 4.5 mEq/L (ref 3.5–5.1)
Sodium: 123 mEq/L — ABNORMAL LOW (ref 135–145)
Sodium: 122 mEq/L — ABNORMAL LOW (ref 135–145)
Total Bilirubin: 0.4 mg/dL (ref 0.3–1.2)
Total Bilirubin: 0.4 mg/dL (ref 0.3–1.2)

## 2010-12-25 LAB — DIFFERENTIAL
Basophils Absolute: 0 10*3/uL (ref 0.0–0.1)
Basophils Relative: 0 % (ref 0–1)
Eosinophils Absolute: 0 10*3/uL (ref 0.0–0.7)
Lymphocytes Relative: 8 % — ABNORMAL LOW (ref 12–46)
Monocytes Absolute: 1.2 10*3/uL — ABNORMAL HIGH (ref 0.1–1.0)
Monocytes Relative: 9 % (ref 3–12)
Neutro Abs: 9.8 10*3/uL — ABNORMAL HIGH (ref 1.7–7.7)
Neutro Abs: 8.3 10*3/uL — ABNORMAL HIGH (ref 1.7–7.7)
Neutrophils Relative %: 82 % — ABNORMAL HIGH (ref 43–77)

## 2010-12-25 LAB — POCT I-STAT TROPONIN I: Troponin i, poc: 0 ng/mL (ref 0.00–0.08)

## 2010-12-25 LAB — URINALYSIS, ROUTINE W REFLEX MICROSCOPIC
Bilirubin Urine: NEGATIVE
Ketones, ur: NEGATIVE mg/dL
Nitrite: NEGATIVE
Specific Gravity, Urine: 1.018 (ref 1.005–1.030)
Urobilinogen, UA: 0.2 mg/dL (ref 0.0–1.0)

## 2010-12-25 LAB — APTT: aPTT: 25 seconds (ref 24–37)

## 2010-12-25 LAB — MRSA PCR SCREENING: MRSA by PCR: NEGATIVE

## 2010-12-25 LAB — URINE MICROSCOPIC-ADD ON

## 2010-12-25 LAB — OCCULT BLOOD, POC DEVICE: Fecal Occult Bld: NEGATIVE

## 2010-12-25 LAB — PROTIME-INR: Prothrombin Time: 13.6 seconds (ref 11.6–15.2)

## 2010-12-25 LAB — LIPASE, BLOOD: Lipase: 7 U/L — ABNORMAL LOW (ref 11–59)

## 2010-12-25 LAB — PREPARE RBC (CROSSMATCH)

## 2010-12-25 NOTE — Consult Note (Signed)
NAMENYELLI, Sandy NO.:  [id number]  MEDICAL RECORD NO.:  [id number]  LOCATION:  3301                         FACILITY:  [facility]  PHYSICIAN:  Salvatore Decent. Dorris Fetch, M.D.DATE OF BIRTH:  06/26/1927  DATE OF CONSULTATION:  12/25/2010 DATE OF DISCHARGE:                                CONSULTATION   REASON FOR CONSULTATION:  Right hemothorax.  [name] is an 77 year old woman of [name] descent who speaks very limited [place].  She presented to the emergency room approximately 1:30 this morning complaining of chest discomfort, shortness of breath, general fatigue.  She was found to be anemic.  An abdominal film was done.  A right pleural effusion was noted on that.  Chest CT was done. It showed a complex right hemothorax which was rather large.  She apparently fell 3 days ago on her right side and has a bruise on her right flank.  She has been started on antibiotics to treat urinary tract infection which is a chronic recurrent problem as well as possible pneumonia.  The patient currently is in no acute distress.  She was hypotensive in the 80s on arrival but improved to the 120s-130s with normal saline.  PAST MEDICAL HISTORY:  Significant for frequent falls, coronary artery disease status post coronary bypass grafting in 2004 by Dr. Cornelius Moras, chronic abdominal pain, nausea, diverticulosis, previous left hemicolectomy, gastroesophageal reflux, hyponatremia and hypokalemia, recurrent urinary tract infections, hyperlipidemia, colonic polyps, past history of tuberculosis, vitamin D deficiency, early stage breast cancer, lumpectomy, likely tamoxifen in 2011.  MEDICATIONS: 1. Phenergan 25-50 mg 4 times daily as needed. 2,  Methadone 10 mg 5 times daily as needed. 1. Depakote. 2. Divalproex 250 mg b.i.d. 3. Vitamin D 50,000 units monthly. 4. Indapamide 2.5 mg tablets, half a tablet daily. 5. Ambien 5 mg at bedtime p.r.n. 6. Trimethoprim 100 mg daily. 7.  Triamcinolone 0.1% b.i.d. 8. Reglan 5 mg t.i.d. before meals. 9. Potassium chloride 10 mEq daily. 10.Omeprazole 20 mg b.i.d. 11.Toprol 25 mg b.i.d. 12.Lactulose 30 mL daily as needed. 13.Benadryl 25 mg every 6 hours as needed. 14.Lorazepam 1 mg every 8 hours as needed.  ALLERGIES:  She has an extensive allergy list including LIDOCAINE, PENICILLIN, DOXYCYCLINE, CIPROFLOXACIN, MEPERIDINE, SULFA, PROCAINE, NALBUPHINE, IV CONTRAST, and ERYTHROMYCIN.  FAMILY HISTORY:  Noncontributory.  SOCIAL HISTORY:  She lives in retirement home with her husband, takes care of her own activities of daily living.  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  REVIEW OF SYSTEMS:  Difficult to obtain secondary to language issues.  PHYSICAL EXAMINATION:  GENERAL:  [name] is an 77 year old frail- appearing woman in no acute distress. VITAL SIGNS:  Blood pressure is 158/76, pulse 86, respirations are 20. NEUROLOGICAL:  She appears alert and oriented.  There are no focal neuro deficits. HEENT:  Unremarkable.  Head is normocephalic, atraumatic. NECK:  Trachea is midline. CHEST:  Absent breath sounds right base. CARDIAC:  Regular rate and rhythm.  Normal S1, S2.  No murmurs, rubs, or gallops.  She has well-healed midline sternal incision. ABDOMEN:  Soft, nontender.  There is ecchymosis in the right lateral costal margin.  There is no peripheral edema.  LABORATORY DATA:  MRSA screen is negative.  PTT 25.  PT 13.6.  Sodium 123, potassium 4.7, BUN 19, creatinine 1.24, glucose 150, CK 34 with MB of 2.4.  White count 12.2, hematocrit 22.3, and platelets 338,000. Urinalysis shows 11-20 white cells, many bacteria, no leukocyte esterase.  IMPRESSION:  [name] is an 77 year old woman who fell 3 days ago.  She now presents with anemia and shortness of breath.  She has a large complex right pleural effusion which likely represents a hemathorax.  Certainly will go with physical exam of the  ecchymosis at the right costal margin.  Unfortunately at this point 3 days out, I do not think chest tube at the bedside would effectively drain this.  She really needs a VATS to evacuate the hemathorax and place chest tubes. Certainly given her advanced age and complex medical history, I think the only risk that is different from a bedside chest tube is those associated with general anesthesia.  She has allergies to multiple local anesthetics.  I think it would be difficult to achieve adequate local anesthesia for a bedside placement in any event.  I discussed this with the patient's family.  We will try to see in 1 week and get this procedure scheduled.     Salvatore Decent Dorris Fetch, M.D.     SCH/MEDQ  D:  12/25/2010  T:  12/25/2010  Job:  [id number]  Electronically Signed by Charlett Lango M.D. on 12/25/2010 01:45:38 PM

## 2010-12-26 ENCOUNTER — Inpatient Hospital Stay (HOSPITAL_COMMUNITY): Payer: Medicare Other

## 2010-12-26 ENCOUNTER — Other Ambulatory Visit: Payer: Self-pay | Admitting: Cardiothoracic Surgery

## 2010-12-26 DIAGNOSIS — R0902 Hypoxemia: Secondary | ICD-10-CM

## 2010-12-26 DIAGNOSIS — S271XXA Traumatic hemothorax, initial encounter: Secondary | ICD-10-CM

## 2010-12-26 DIAGNOSIS — J90 Pleural effusion, not elsewhere classified: Secondary | ICD-10-CM

## 2010-12-26 DIAGNOSIS — W19XXXA Unspecified fall, initial encounter: Secondary | ICD-10-CM

## 2010-12-26 LAB — DIFFERENTIAL
Lymphocytes Relative: 11 % — ABNORMAL LOW (ref 12–46)
Lymphs Abs: 1 10*3/uL (ref 0.7–4.0)
Monocytes Relative: 14 % — ABNORMAL HIGH (ref 3–12)
Neutro Abs: 6.1 10*3/uL (ref 1.7–7.7)
Neutrophils Relative %: 72 % (ref 43–77)

## 2010-12-26 LAB — BLOOD GAS, ARTERIAL
Acid-Base Excess: 1.4 mmol/L (ref 0.0–2.0)
Bicarbonate: 25.2 mEq/L — ABNORMAL HIGH (ref 20.0–24.0)
Drawn by: 331001
O2 Content: 10 L/min
O2 Saturation: 98.6 %
Patient temperature: 98.6
TCO2: 26.3 mmol/L (ref 0–100)
pCO2 arterial: 37.7 mmHg (ref 35.0–45.0)
pH, Arterial: 7.439 — ABNORMAL HIGH (ref 7.350–7.400)
pO2, Arterial: 157 mmHg — ABNORMAL HIGH (ref 80.0–100.0)

## 2010-12-26 LAB — BASIC METABOLIC PANEL
CO2: 27 mEq/L (ref 19–32)
Calcium: 8.8 mg/dL (ref 8.4–10.5)
Creatinine, Ser: 0.84 mg/dL (ref 0.50–1.10)
Glucose, Bld: 118 mg/dL — ABNORMAL HIGH (ref 70–99)
Sodium: 130 mEq/L — ABNORMAL LOW (ref 135–145)

## 2010-12-26 LAB — CBC
HCT: 29.3 % — ABNORMAL LOW (ref 36.0–46.0)
Hemoglobin: 10.2 g/dL — ABNORMAL LOW (ref 12.0–15.0)
MCH: 30.4 pg (ref 26.0–34.0)
MCV: 87.2 fL (ref 78.0–100.0)
RBC: 3.36 MIL/uL — ABNORMAL LOW (ref 3.87–5.11)

## 2010-12-26 LAB — VITAMIN B12: Vitamin B-12: 503 pg/mL (ref 211–911)

## 2010-12-26 LAB — IRON AND TIBC

## 2010-12-27 ENCOUNTER — Inpatient Hospital Stay (HOSPITAL_COMMUNITY): Payer: Medicare Other

## 2010-12-27 LAB — POCT I-STAT 3, ART BLOOD GAS (G3+)
Acid-Base Excess: 2 mmol/L (ref 0.0–2.0)
Bicarbonate: 25.8 mEq/L — ABNORMAL HIGH (ref 20.0–24.0)
O2 Saturation: 99 %
Patient temperature: 99.1
TCO2: 27 mmol/L (ref 0–100)

## 2010-12-27 LAB — BASIC METABOLIC PANEL
BUN: 8 mg/dL (ref 6–23)
CO2: 26 mEq/L (ref 19–32)
Calcium: 8.3 mg/dL — ABNORMAL LOW (ref 8.4–10.5)
Chloride: 94 mEq/L — ABNORMAL LOW (ref 96–112)
Creatinine, Ser: 0.68 mg/dL (ref 0.50–1.10)
GFR calc Af Amer: >60 mL/min (ref >60)
GFR calc non Af Amer: >60 mL/min (ref >60)
Glucose, Bld: 143 mg/dL — ABNORMAL HIGH (ref 70–99)
Potassium: 3.7 mEq/L (ref 3.5–5.1)
Sodium: 125 mEq/L — ABNORMAL LOW (ref 135–145)

## 2010-12-27 LAB — CBC
HCT: 26 % — ABNORMAL LOW (ref 36.0–46.0)
Hemoglobin: 9 g/dL — ABNORMAL LOW (ref 12.0–15.0)
MCH: 30.4 pg (ref 26.0–34.0)
MCHC: 34.6 g/dL (ref 30.0–36.0)
MCV: 87.8 fL (ref 78.0–100.0)
Platelets: 220 10*3/uL (ref 150–400)
RBC: 2.96 MIL/uL — ABNORMAL LOW (ref 3.87–5.11)
RDW: 14.9 % (ref 11.5–15.5)
WBC: 8.9 10*3/uL (ref 4.0–10.5)

## 2010-12-27 LAB — MAGNESIUM: Magnesium: 1.9 mg/dL (ref 1.5–2.5)

## 2010-12-27 LAB — URINE CULTURE
Colony Count: >=100000
Culture  Setup Time: 201208200404

## 2010-12-27 LAB — PHOSPHORUS: Phosphorus: 2.7 mg/dL (ref 2.3–4.6)

## 2010-12-28 ENCOUNTER — Inpatient Hospital Stay (HOSPITAL_COMMUNITY): Payer: Medicare Other

## 2010-12-28 ENCOUNTER — Telehealth: Payer: Self-pay | Admitting: *Deleted

## 2010-12-28 DIAGNOSIS — R0902 Hypoxemia: Secondary | ICD-10-CM

## 2010-12-28 DIAGNOSIS — S271XXA Traumatic hemothorax, initial encounter: Secondary | ICD-10-CM

## 2010-12-28 DIAGNOSIS — W19XXXA Unspecified fall, initial encounter: Secondary | ICD-10-CM

## 2010-12-28 DIAGNOSIS — J90 Pleural effusion, not elsewhere classified: Secondary | ICD-10-CM

## 2010-12-28 LAB — COMPREHENSIVE METABOLIC PANEL
ALT: 12 U/L (ref 0–35)
AST: 23 U/L (ref 0–37)
Albumin: 2.1 g/dL — ABNORMAL LOW (ref 3.5–5.2)
Alkaline Phosphatase: 111 U/L (ref 39–117)
BUN: 10 mg/dL (ref 6–23)
CO2: 27 mEq/L (ref 19–32)
Calcium: 8.4 mg/dL (ref 8.4–10.5)
Chloride: 91 mEq/L — ABNORMAL LOW (ref 96–112)
Creatinine, Ser: 0.85 mg/dL (ref 0.50–1.10)
GFR calc Af Amer: >60 mL/min (ref >60)
GFR calc non Af Amer: >60 mL/min (ref >60)
Glucose, Bld: 127 mg/dL — ABNORMAL HIGH (ref 70–99)
Potassium: 3.8 mEq/L (ref 3.5–5.1)
Sodium: 127 mEq/L — ABNORMAL LOW (ref 135–145)
Total Bilirubin: 0.3 mg/dL (ref 0.3–1.2)
Total Protein: 5.1 g/dL — ABNORMAL LOW (ref 6.0–8.3)

## 2010-12-28 LAB — CARDIAC PANEL(CRET KIN+CKTOT+MB+TROPI)
CK, MB: 1.8 ng/mL (ref 0.3–4.0)
CK, MB: 2.2 ng/mL (ref 0.3–4.0)
Total CK: 160 U/L (ref 7–177)
Troponin I: <0.3 ng/mL (ref <0.30)

## 2010-12-28 LAB — CBC
HCT: 25.8 % — ABNORMAL LOW (ref 36.0–46.0)
Hemoglobin: 8.8 g/dL — ABNORMAL LOW (ref 12.0–15.0)
MCH: 30.6 pg (ref 26.0–34.0)
MCHC: 34.1 g/dL (ref 30.0–36.0)
MCV: 89.6 fL (ref 78.0–100.0)
Platelets: 213 10*3/uL (ref 150–400)
RBC: 2.88 MIL/uL — ABNORMAL LOW (ref 3.87–5.11)
RDW: 14.7 % (ref 11.5–15.5)
WBC: 10.4 10*3/uL (ref 4.0–10.5)

## 2010-12-28 LAB — MAGNESIUM: Magnesium: 1.9 mg/dL (ref 1.5–2.5)

## 2010-12-28 LAB — PHOSPHORUS: Phosphorus: 4 mg/dL (ref 2.3–4.6)

## 2010-12-28 LAB — LACTIC ACID, PLASMA: Lactic Acid, Venous: 1.6 mmol/L (ref 0.5–2.2)

## 2010-12-28 NOTE — Telephone Encounter (Signed)
Spoke w/daughter, FMLA forms are ready for pick up.

## 2010-12-29 ENCOUNTER — Inpatient Hospital Stay (HOSPITAL_COMMUNITY): Payer: Medicare Other

## 2010-12-29 DIAGNOSIS — R55 Syncope and collapse: Secondary | ICD-10-CM

## 2010-12-29 LAB — COMPREHENSIVE METABOLIC PANEL
ALT: 40 U/L — ABNORMAL HIGH (ref 0–35)
AST: 49 U/L — ABNORMAL HIGH (ref 0–37)
Albumin: 2.2 g/dL — ABNORMAL LOW (ref 3.5–5.2)
Alkaline Phosphatase: 123 U/L — ABNORMAL HIGH (ref 39–117)
BUN: 13 mg/dL (ref 6–23)
CO2: 29 mEq/L (ref 19–32)
Calcium: 8.4 mg/dL (ref 8.4–10.5)
Chloride: 97 mEq/L (ref 96–112)
Creatinine, Ser: 0.74 mg/dL (ref 0.50–1.10)
GFR calc Af Amer: >60 mL/min (ref >60)
GFR calc non Af Amer: >60 mL/min (ref >60)
Glucose, Bld: 112 mg/dL — ABNORMAL HIGH (ref 70–99)
Potassium: 3.7 mEq/L (ref 3.5–5.1)
Sodium: 130 mEq/L — ABNORMAL LOW (ref 135–145)
Total Bilirubin: 0.3 mg/dL (ref 0.3–1.2)
Total Protein: 5.4 g/dL — ABNORMAL LOW (ref 6.0–8.3)

## 2010-12-29 LAB — CBC
HCT: 27.9 % — ABNORMAL LOW (ref 36.0–46.0)
Hemoglobin: 9.6 g/dL — ABNORMAL LOW (ref 12.0–15.0)
MCH: 30.9 pg (ref 26.0–34.0)
MCHC: 34.4 g/dL (ref 30.0–36.0)
MCV: 89.7 fL (ref 78.0–100.0)
Platelets: 233 10*3/uL (ref 150–400)
RBC: 3.11 MIL/uL — ABNORMAL LOW (ref 3.87–5.11)
RDW: 14.6 % (ref 11.5–15.5)
WBC: 9 10*3/uL (ref 4.0–10.5)

## 2010-12-29 LAB — CROSSMATCH
ABO/RH(D): O NEG
Unit division: 0
Unit division: 0

## 2010-12-29 LAB — TISSUE CULTURE: Culture: NO GROWTH

## 2010-12-29 LAB — CARDIAC PANEL(CRET KIN+CKTOT+MB+TROPI): Relative Index: 1.6 (ref 0.0–2.5)

## 2010-12-29 LAB — BODY FLUID CULTURE

## 2010-12-30 ENCOUNTER — Inpatient Hospital Stay (HOSPITAL_COMMUNITY): Payer: Medicare Other

## 2010-12-30 LAB — BASIC METABOLIC PANEL
Calcium: 8.2 mg/dL — ABNORMAL LOW (ref 8.4–10.5)
Chloride: 97 mEq/L (ref 96–112)
Creatinine, Ser: 0.68 mg/dL (ref 0.50–1.10)
GFR calc Af Amer: >60 mL/min (ref >60)
Sodium: 129 mEq/L — ABNORMAL LOW (ref 135–145)

## 2010-12-30 LAB — CBC
MCV: 90.1 fL (ref 78.0–100.0)
Platelets: 226 10*3/uL (ref 150–400)
RDW: 14.5 % (ref 11.5–15.5)
WBC: 7.1 10*3/uL (ref 4.0–10.5)

## 2010-12-30 LAB — MAGNESIUM: Magnesium: 1.9 mg/dL (ref 1.5–2.5)

## 2010-12-31 ENCOUNTER — Inpatient Hospital Stay (HOSPITAL_COMMUNITY): Payer: Medicare Other

## 2010-12-31 LAB — CULTURE, BLOOD (ROUTINE X 2): Culture: NO GROWTH

## 2010-12-31 LAB — COMPREHENSIVE METABOLIC PANEL
ALT: 33 U/L (ref 0–35)
Albumin: 1.9 g/dL — ABNORMAL LOW (ref 3.5–5.2)
Alkaline Phosphatase: 102 U/L (ref 39–117)
Potassium: 3.4 mEq/L — ABNORMAL LOW (ref 3.5–5.1)
Sodium: 128 mEq/L — ABNORMAL LOW (ref 135–145)
Total Protein: 4.7 g/dL — ABNORMAL LOW (ref 6.0–8.3)

## 2010-12-31 LAB — DIFFERENTIAL
Basophils Absolute: 0 10*3/uL (ref 0.0–0.1)
Basophils Relative: 1 % (ref 0–1)
Eosinophils Relative: 9 % — ABNORMAL HIGH (ref 0–5)
Monocytes Absolute: 0.8 10*3/uL (ref 0.1–1.0)
Neutro Abs: 3.6 10*3/uL (ref 1.7–7.7)

## 2010-12-31 LAB — CBC
MCHC: 34.2 g/dL (ref 30.0–36.0)
RDW: 14.2 % (ref 11.5–15.5)

## 2010-12-31 LAB — ANAEROBIC CULTURE

## 2010-12-31 NOTE — Progress Notes (Signed)
[name], TULLIUS NO.:  [id number]  MEDICAL RECORD NO.:  [id number]  LOCATION:  2006                         FACILITY:  [facility]  PHYSICIAN:  Talmage Nap, MD  DATE OF BIRTH:  11-26-1927                                PROGRESS NOTE   ADMITTING PHYSICIAN: Carlota Raspberry, MD  PRIMARY CARE PHYSICIAN: Georgina Quint. Plotnikov, MD  CONSULTANTS INVOLVED IN THE CASE: 1. Cardiothoracic surgeon, Dr. Romeo Apple. 2. Cardiology, Dr. Marca Ancona. 3. [facility].  [facility] PHYSICIANS INVOLVING CASE: 1. Jeoffrey Massed, MD 2. Thad Ranger, MD 3. Talmage Nap, MD  DIAGNOSES: 1. Fall with right hemopneumothorax status post video-assisted     thoracostomy/evacuation of clot/decortication right lower lobe. 2. Coronary artery disease status post coronary artery bypass graft. 3. Hypertension. 4. Anemia status post packed RBC transfusion. 5. Urinary tract infection. 6. Vulvar vaginitis. 7. Irritable bladder/bladder dyskinesia. 8. Hyperlipidemia. 9. Chronic constipation. 10.History of breast cancer. 11.Allergic rhinitis. 12.Hyponatremia. 13.Hypokalemia. 14.Chronic constipation. 15.Deconditioning.  The patient is an 77 year old Caucasian female [name] who does not speak very fluent English was admitted to the hospital on December 25, 2010 with complaints of weakness and fatigue.  The patient was said to have had a fall at home 3 days prior to this admission and subsequently sustained blunt trauma to the thorax.  She, however, complained of on and off episodes of right-sided chest pain with occasional shortness of breath and in the emergency room the patient was found to be hypotensive and after IV fluid resuscitation, blood pressure became stabilized and also a chest x-ray done showed right hemothorax and subsequently the patient was admitted to step-down and Cardiothoracic Surgery was consulted.  Extensive hospital course in this patient, please refer  to progress note dictated by Dr. Thad Ranger on December 28, 2010.  The patient was, however, seen by me for the very first time in this index admission on December 30, 2010 and during this encounter the patient complained about whitish vaginal discharge and irritation.  On examination, the patient was essentially unremarkable.  Her vital signs, blood pressure 169/66, pulse 70, respiratory rate 16, temperature is 98.9.  LABORATORY DATA: Complete blood count with differential done on December 30, 2010 showed WBC of 7.1, hemoglobin of 8.3, hematocrit of 24.6, MCV of 90.1, platelet count of 226,000.  Basic metabolic panel showed sodium of 129, potassium of 3.7, chloride of 97 with a bicarb of 29, glucose is 94, BUN is 12, creatinine 0.68, magnesium level is 1.9, phosphorus level is 2.4, and a repeat complete blood count with differential done on December 31, 2010 showed WBC of 5.9, hemoglobin 8.1, hematocrit 23.7, MCV 89.20, platelet count of 218,000.  Comprehensive metabolic panel shows sodium of 120, potassium of 3.4, chloride of 93 with a bicarb of 31, glucose is 96, BUN 11, creatinine 0.78.  LFTs are essentially normal.  Total protein 12.7, albumin is 1.9, calcium is 8.1, magnesium level is 1.9.  Imaging studies done include chest x-ray done on December 30, 2010 showed right effusion. We malpositioned central venous catheter and a repeat chest x-ray done on December 31, 2010, report still pending.  HOSPITAL COURSE: The patient had evacuation of right hemothorax as well as decortication of the right  lower lobe done by the cardiothoracic surgeon and her blood pressure was maintained by IV fluid normal saline.  The patient was, however, stabilized in step-down and subsequently transferred to general medical floor.  She was also started on treatment for UTI with IV Rocephin.  At that time, the patient was seen by me for the very first time which was on December 30, 2010.  Flomax 0.4 mg was added to  the patient's regimen because of the irritable bladder and she was also given Diflucan 150 mg p.o. stat and also miconazole cream to be applied to the vulvar region.  So far the patient has remained clinically stable.  She was reevaluated by me today again which is December 31, 2010, complained about constipation.  Her vital signs, blood pressure is 142/78, pulse 56, respiratory 18, temperature is 98.6, however, a review of her hematologic indices showed the patient to have a hemoglobin of about 8.1.  It is important to mention that on my first encounter with the patient which was on December 30, 2010 Diovan 160 mg p.o. daily as well as Lopressor 25 mg p.o. b.i.d. was added to the patient's regimen. The plan today which is December 31, 2010 is to type and cross and transfuse the patient with 1 unit of packed RBC and also Physical Therapy to be consulted for gradual ambulation of the patient.  CURRENT MEDICATIONS: 1. Dulcolax 10 mg p.o. daily. 2. Rocephin 1 gram IV q.24. 3. Depakote 250 mg p.o. b.i.d. 4. Colace 100 mg p.o. b.i.d. 5. Lactulose 30 mL p.o. daily. 6. Methadone 10 mg p.o. 5 times daily. 7. Reglan 5 mg p.o. t.i.d. 8. Lopressor 25 mg p.o. b.i.d. 9. Miconazole cream, apply vaginally b.i.d. 10.Benicar that is olmesartan 20 mg p.o. daily. 11.Ditropan 2.5 mg p.o. b.i.d. 12.Pantoprazole 40 mg p.o. b.i.d. 13.MiraLax 17 grams p.o. at bedtime. 14.Flomax 0.4 mg p.o. at bedtime. 15.Triamcinolone acetonide cream applied topically b.i.d. 16.Benadryl 25 mg p.o. q.6. 17.Nitroglycerin 0.4 mg sublingual p.r.n. 18.Zofran 4 mg p.o. q.6. 19.Tramadol 50 mg p.o. q.6. 20.Ambien 5 mg p.o. at bedtime.  The patient will be followed and evaluated on daily basis and time of discharge will be determined by the rounding physician.     Talmage Nap, MD     CN/MEDQ  D:  12/31/2010  T:  12/31/2010  Job:  [id number]  Electronically Signed by Talmage Nap  on 12/31/2010 05:17:28 PM

## 2011-01-01 ENCOUNTER — Inpatient Hospital Stay (HOSPITAL_COMMUNITY): Payer: Medicare Other

## 2011-01-01 LAB — DIFFERENTIAL
Lymphocytes Relative: 13 % (ref 12–46)
Lymphs Abs: 0.8 10*3/uL (ref 0.7–4.0)
Neutro Abs: 4.1 10*3/uL (ref 1.7–7.7)
Neutrophils Relative %: 64 % (ref 43–77)

## 2011-01-01 LAB — CROSSMATCH: Unit division: 0

## 2011-01-01 LAB — COMPREHENSIVE METABOLIC PANEL
AST: 21 U/L (ref 0–37)
Alkaline Phosphatase: 116 U/L (ref 39–117)
BUN: 10 mg/dL (ref 6–23)
CO2: 34 mEq/L — ABNORMAL HIGH (ref 19–32)
Chloride: 90 mEq/L — ABNORMAL LOW (ref 96–112)
Creatinine, Ser: 0.75 mg/dL (ref 0.50–1.10)
GFR calc non Af Amer: >60 mL/min (ref >60)
Potassium: 3 mEq/L — ABNORMAL LOW (ref 3.5–5.1)
Total Bilirubin: 0.4 mg/dL (ref 0.3–1.2)

## 2011-01-01 LAB — CBC
HCT: 30.5 % — ABNORMAL LOW (ref 36.0–46.0)
MCV: 86.2 fL (ref 78.0–100.0)
RBC: 3.54 MIL/uL — ABNORMAL LOW (ref 3.87–5.11)
WBC: 6.4 10*3/uL (ref 4.0–10.5)

## 2011-01-01 LAB — MAGNESIUM: Magnesium: 1.9 mg/dL (ref 1.5–2.5)

## 2011-01-02 ENCOUNTER — Inpatient Hospital Stay (HOSPITAL_COMMUNITY): Payer: Medicare Other

## 2011-01-02 LAB — CBC
MCH: 29.6 pg (ref 26.0–34.0)
Platelets: 268 10*3/uL (ref 150–400)
RBC: 3.61 MIL/uL — ABNORMAL LOW (ref 3.87–5.11)
RDW: 14.6 % (ref 11.5–15.5)

## 2011-01-02 LAB — COMPREHENSIVE METABOLIC PANEL
ALT: 20 U/L (ref 0–35)
AST: 21 U/L (ref 0–37)
Albumin: 2.3 g/dL — ABNORMAL LOW (ref 3.5–5.2)
CO2: 31 mEq/L (ref 19–32)
Calcium: 9 mg/dL (ref 8.4–10.5)
Creatinine, Ser: 0.75 mg/dL (ref 0.50–1.10)
Sodium: 131 mEq/L — ABNORMAL LOW (ref 135–145)
Total Protein: 5.5 g/dL — ABNORMAL LOW (ref 6.0–8.3)

## 2011-01-02 LAB — DIFFERENTIAL
Basophils Relative: 0 % (ref 0–1)
Eosinophils Absolute: 0.7 10*3/uL (ref 0.0–0.7)
Eosinophils Relative: 10 % — ABNORMAL HIGH (ref 0–5)
Monocytes Relative: 16 % — ABNORMAL HIGH (ref 3–12)
Neutrophils Relative %: 58 % (ref 43–77)

## 2011-01-03 LAB — COMPREHENSIVE METABOLIC PANEL
ALT: 17 U/L (ref 0–35)
Alkaline Phosphatase: 111 U/L (ref 39–117)
BUN: 12 mg/dL (ref 6–23)
CO2: 31 mEq/L (ref 19–32)
Calcium: 8.6 mg/dL (ref 8.4–10.5)
GFR calc Af Amer: >60 mL/min (ref >60)
GFR calc non Af Amer: >60 mL/min (ref >60)
Glucose, Bld: 93 mg/dL (ref 70–99)
Sodium: 133 mEq/L — ABNORMAL LOW (ref 135–145)
Total Protein: 5.2 g/dL — ABNORMAL LOW (ref 6.0–8.3)

## 2011-01-03 LAB — DIFFERENTIAL
Lymphocytes Relative: 16 % (ref 12–46)
Lymphs Abs: 1 10*3/uL (ref 0.7–4.0)
Monocytes Absolute: 0.8 10*3/uL (ref 0.1–1.0)
Monocytes Relative: 12 % (ref 3–12)
Neutro Abs: 3.7 10*3/uL (ref 1.7–7.7)
Neutrophils Relative %: 59 % (ref 43–77)

## 2011-01-03 LAB — CBC
HCT: 30 % — ABNORMAL LOW (ref 36.0–46.0)
Hemoglobin: 10.1 g/dL — ABNORMAL LOW (ref 12.0–15.0)
MCH: 30.1 pg (ref 26.0–34.0)
MCHC: 33.7 g/dL (ref 30.0–36.0)
RBC: 3.36 MIL/uL — ABNORMAL LOW (ref 3.87–5.11)

## 2011-01-03 LAB — MAGNESIUM: Magnesium: 2 mg/dL (ref 1.5–2.5)

## 2011-01-03 NOTE — Op Note (Signed)
[name], [name] NO.:  [id number]  MEDICAL RECORD NO.:  [id number]  LOCATION:  2550                         FACILITY:  [facility]  PHYSICIAN:  Kerin Perna, M.D.  DATE OF BIRTH:  11-16-1927  DATE OF PROCEDURE:  12/26/2010 DATE OF DISCHARGE:                              OPERATIVE REPORT   OPERATION: 1. Right vats (video-assisted thoracoscopic surgery) with     minithoracotomy. 2. Evacuation of right pleural space hematoma and decortication of     right lower lobe. 3. Placement On-Q wound irrigation analgesia system.  SURGEON:  Kerin Perna, MD  ASSISTANT:  Doree Fudge, PA-C  PREOPERATIVE DIAGNOSIS:  Right hemothorax with clotted and non-clotted blood entrapping the right lower lobe.  POSTOPERATIVE DIAGNOSIS:  Right hemothorax with clotted and non-clotted blood entrapping the right lower lobe.  ANESTHESIA:  General.  INDICATIONS:  The patient is an 77 year old Caucasian female ([name]- speaking) who sustained a fall at home 3 days previously when she tripped and fell on the right side.  She presented with right-sided pain and a right bruise over her right lower thorax with shortness of breath and a drop in hematocrit from 36-22.  A chest x-ray showed large right pleural effusion.  This was followed up with a CT scan of the chest which showed this to be a complex collection of probable blood which occupied a large portion of the right hemithorax.  The right lower lobe was collapsed from extrinsic compression.  Thoracic surgical evaluation treatment was requested.  I evaluated the patient in her hospital room and reviewed results of the chest x-ray and CT scan.  I felt that she would benefit from a right VATS evacuation of hemothorax and probable decortication of the right lower lobe as well.  She understood the indications, benefits, alternatives and risks of surgery and agreed to proceed with operation as planned.  The patient's daughter  served as her interpreter during our preoperative discussion of the recommendation for surgery and the expectations for recovery and potential risks.  PROCEDURE:  The patient was brought to the operating room and placed supine on the table where general anesthesia was induced.  A double- lumen endotracheal tube was positioned by the anesthesiologist and the patient was turned right side up.  A proper time-out was performed to confirm proper patient and proper site.  The right chest was prepped and draped as a sterile field.  A small 1-inch incision was made in the fifth interspace just anterior to the tip of the scapula and the VATS camera was inserted.  There was a large amount of clotted and non- clotted blood inferiorly in the right hemithorax with loculated pockets. For that reason, the camera was removed and the incision was slightly extended for a mini thoracotomy.  The ribs were gently spread but not divided.  The lung was inspected and found to have no abnormalities. The pleural surface appeared to be smooth without evidence of pathology.However, there was a large hemothorax which was loculated in the subpleural portion between the right hemidiaphragm and the right lower lobe compressing the right lower lobe.  There was also large amount of clotted blood in this area extending down the posterior sulcus.  This was all removed with suction as well as mechanical removal of the clotted blood.  The right hemidiaphragm was carefully inspected, and there was no evidence of diaphragmatic injury or rupture.  There was no active bleeding noted.  After the blood was evacuated, the lung was again inspected and no pathology was noted.  A culture of the fluid and blood clot was sent as well as of the pleura.  Next, the right lung was inflated under direct vision with good expansion to fill the right hemithorax.  A 32-French Bard catheter was placed posteriorly in the right hemithorax and  brought out through a separate stab wound and secured to the skin with silk suture.  The incision was then closed with #2 Vicryl pericostal sutures.  The muscle layers were closed with interrupted #1 Vicryl.  The subcutaneous and skin were closed with running Vicryl.  An On-Q catheter was placed just beneath the incision above the chest tube site, secured to the skin and connected to a reservoir of 0.5% Marcaine.  The patient was extubated in the OR and returned to recovery room in stable condition.     Kerin Perna, M.D.     PV/MEDQ  D:  12/26/2010  T:  12/26/2010  Job:  [id number]  Electronically Signed by Kerin Perna M.D. on 01/03/2011 09:33:27 AM

## 2011-01-11 NOTE — Discharge Summary (Signed)
NAMECLAY, [name] NO.:  [id number]  MEDICAL RECORD NO.:  [id number]  LOCATION:  2006                         FACILITY:  [facility]  PHYSICIAN:  Ramiro Harvest, MD    DATE OF BIRTH:  12-Feb-1928  DATE OF ADMISSION:  12/25/2010 DATE OF DISCHARGE:  01/03/2011                              DISCHARGE SUMMARY   ADDENDUM:  This is an addendum to prior progress notes that were done for the rest of the hospitalization.  Please see progress note dictated on December 28, 2010, per Dr. Isidoro Donning of job [id number] and also see progress note dictated by Dr. Beverly Gust on December 31, 2010, of job #[id number].  DISCHARGE DIAGNOSES: 1. Right hemi pneumothorax status post video-assisted     thoracostomy/evacuation of clot/decortication of right lower lobe. 2. Coronary artery disease status post coronary artery bypass graft. 3. Hypertension. 4. Urinary retention.  The patient will be discharged with a Foley     catheter. 5. Anemia secondary to right hemi pneumothorax status post 3 units     packed red blood cells. 6. Urinary tract infection status post treatment. 7. Valvular vaginitis. 8. Irritable bladder/bladder dyskinesia. 9. Hyperlipidemia. 10.Chronic constipation. 11.History of breast cancer. 12.Allergic rhinitis. 13.Hyponatremia, improved. 14.Hypokalemia, improved. 15.Deconditioning. 16.Chronic abdominal pain.  DISCHARGE MEDICATIONS: 1. Tylenol 650 mg p.o. q.4 h p.r.n. pain. 2. Dulcolax 10 mg p.o. daily. 3. Colace 100 mg p.o. b.i.d. 4. Fleet Enema 133 mL rectally daily as needed. 5. Folic acid 1 mg p.o. daily. 6. Iron complex 150 mg p.o. daily. 7. Monistat apply vaginally twice daily. 8. Nutritional supplement liquid Resource Breeze Orange 240 mL p.o.     b.i.d. 9. Benicar 20 mg 1-1/2 tablets p.o. daily. 10.Oxybutynin 5 mg, 2.5 mg p.o. b.i.d. 11.MiraLax 17 g p.o. at bedtime. 12.Senokot-S tab 1 tablet p.o. daily p.r.n. 13.Flomax 0.4 mg p.o. daily. 14.Tramadol 50 mg 1-2  tablets p.o. q.6 h p.r.n. pain. 15.Ambien 5 mg p.o. at bedtime p.r.n. 16.Benadryl 25 mg p.o. q.6 h p.r.n. 17.Depakote ER 250 mg p.o. b.i.d. 18.Lactulose 30-60 mL p.o. daily p.r.n. 19.Lorazepam 1 mg p.o. q.8 h p.r.n. 20.Methadone 10 mg p.o. 5 times daily p.r.n. 21.Nitroglycerin sublingual 0.4 mg p.o. q.5 minutes x3 p.r.n. chest     pain. 22.Omeprazole 20 mg p.o. b.i.d. 23.Potassium chloride 10 mEq p.o. daily. 24.Promethazine 25 mg 1-2 tablets p.o. q.i.d. p.r.n. 25.Reglan 5 mg p.o. t.i.d. 26.Trimethoprim 100 mg p.o. daily. 27.Triamcinolone topical ointment, apply topically twice daily. 28.Vitamin D 50,000 units p.o. monthly.  DISPOSITION AND FOLLOWUP:  The patient will be discharged to skilled nursing facility.  The patient is to follow up with Dr. Donata [name] of [facility] on January 17, 2011, at 12:30 p.m.  The patient is to use soap and water for wound care at the chest tube site and the skilled nursing facility is to remove the chest tube sutures on January 07, 2011, per [facility] recommendations.  The patient is to also follow up with PCP in 1-2 weeks.  The patient will need her blood pressure reassessed.  Her indapamide and her metoprolol were discontinued secondary to bradycardia as well as hyponatremia.  The patient's Benicar has been increased to 30 mg daily.  Her blood pressure will need to be  reassessed per MD at the nursing facility and medications readjusted appropriately.  The patient will need a BMET and a CBC checked 1 week post discharge to follow up on electrolytes, renal function as well as her hemoglobin.  The patient will be discharged with a Foley catheter secondary to urinary retention. The patient is on oxybutynin and Flomax.  The patient will need a voiding trial in a few days.  If the patient is still unable to void, the patient may benefit from outpatient urology referral which can be done per MD at the facility or per the patient's PCP.  The patient is to follow up  with her cardiologist, Dr. [name], as scheduled.  CONSULTATIONS DONE DURING THIS HOSPITALIZATION:  A Cardiology consultation was done.  The patient was seen in consultation by Dr. Marca Ancona on December 25, 2010.  The patient was also seen by Dr. Dorris Fetch of [facility] on December 25, 2010.  For the rest of the hospitalization, please see prior progress notes dictated per Dr. Isidoro Donning of job [phone] and progress note dictated by Dr. Beverly Gust of job [id number].  HOSPITAL COURSE:  I personally took care of the patient on the day of discharge on January 03, 2011, at which point in time, the patient was in stable condition.  The patient had a bowel movement with bowel regimen that was given.  The patient was complaining of some lower abdominal pain which family stated that this had been chronic in nature and had been occurring for greater than 60 years.  A bladder scan was done.  The patient was noted to have greater than 999 mL and as such a Foley catheter has been placed with a leg bag.  The patient is on Ditropan as well as Flomax.  The patient will be discharged to nursing home with a Foley catheter leg bag.  The patient will need a voiding trial in a few days.  If the patient is still unable to void may need to refer the patient to urology outpatient for further evaluation and management of her urinary retention.  The patient was also noted to have a heart rate in the 50s and as such her metoprolol was discontinued per [facility] on January 03, 2011.  The patient will follow up with her PCP as well as her cardiologist.  The rest of the patient's her chronic issues have remained stable during this time.  For the rest the hospitalization, please refer to prior progress notes as dictated above.  On day of discharge, vital signs are temperature of 97.8, pulse of 54, respirations 19, blood pressure 168/74, and saturating 97% on room air.  DISCHARGE LABORATORY DATA:  Magnesium of 2.  CMET:  Sodium  133, potassium 3.7, chloride 95, bicarb 31, glucose 93, BUN 12, and creatinine 0.81.  Bilirubin 0.3, alk phosphatase 111, AST 17, ALT 17, protein 5.2, albumin 2.2, and calcium of 8.6.  CBC with a white count of 6.3, hemoglobin 10.1, hematocrit 30.0, and platelet count of 269 with an ANC of 3.7.  It has been a pleasure taking care of Ms. Day.     Ramiro Harvest, MD     DT/MEDQ  D:  01/03/2011  T:  01/03/2011  Job:  [id number]  cc:   Georgina Quint. Plotnikov, MD Marca Ancona, MD Kerin Perna, M.D. Salvatore Decent Dorris Fetch, M.D. Maxwell Caul, M.D. Luis Abed, MD, [facility]  Electronically Signed by Ramiro Harvest MD on 01/11/2011 12:21:22 PM

## 2011-01-11 NOTE — Discharge Summary (Signed)
  NAMEMARILLYN, [name] NO.:  [id number]  MEDICAL RECORD NO.:  [id number]  LOCATION:  2006                         FACILITY:  [facility]  PHYSICIAN:  Ramiro Harvest, MD    DATE OF BIRTH:  July 08, 1927  DATE OF ADMISSION:  12/25/2010 DATE OF DISCHARGE:  01/03/2011                              DISCHARGE SUMMARY   This is an addendum to a prior discharge summary done per Dr. Janee Morn of job number [id number].  ADDITIONAL DISCHARGE MEDICATIONS:  Potassium chloride should read as 20 mEq p.o. b.i.d. not 10 mEq p.o. daily.     Ramiro Harvest, MD     DT/MEDQ  D:  01/03/2011  T:  01/03/2011  Job:  [id number]  cc:   Georgina Quint. Plotnikov, MD Marca Ancona, MD Luis Abed, MD, [facility] Kerin Perna, M.D.  Electronically Signed by Ramiro Harvest MD on 01/11/2011 12:21:10 PM

## 2011-01-15 ENCOUNTER — Other Ambulatory Visit: Payer: Self-pay | Admitting: Cardiothoracic Surgery

## 2011-01-15 DIAGNOSIS — D381 Neoplasm of uncertain behavior of trachea, bronchus and lung: Secondary | ICD-10-CM

## 2011-01-16 DIAGNOSIS — J942 Hemothorax: Secondary | ICD-10-CM | POA: Insufficient documentation

## 2011-01-17 ENCOUNTER — Encounter: Payer: Self-pay | Admitting: Cardiothoracic Surgery

## 2011-01-17 ENCOUNTER — Ambulatory Visit (INDEPENDENT_AMBULATORY_CARE_PROVIDER_SITE_OTHER): Payer: Self-pay | Admitting: Cardiothoracic Surgery

## 2011-01-17 ENCOUNTER — Ambulatory Visit
Admission: RE | Admit: 2011-01-17 | Discharge: 2011-01-17 | Disposition: A | Discharge status: Home / Self Care | Payer: Medicare Other | Source: Ambulatory Visit | Attending: Cardiothoracic Surgery | Admitting: Cardiothoracic Surgery

## 2011-01-17 VITALS — BP 135/80 | HR 82 | Resp 20 | Ht 65.0 in | Wt 145.0 lb

## 2011-01-17 DIAGNOSIS — D381 Neoplasm of uncertain behavior of trachea, bronchus and lung: Secondary | ICD-10-CM

## 2011-01-17 DIAGNOSIS — J90 Pleural effusion, not elsewhere classified: Secondary | ICD-10-CM

## 2011-01-17 DIAGNOSIS — Z09 Encounter for follow-up examination after completed treatment for conditions other than malignant neoplasm: Secondary | ICD-10-CM

## 2011-01-17 DIAGNOSIS — J942 Hemothorax: Secondary | ICD-10-CM

## 2011-01-17 NOTE — Patient Instructions (Signed)
OK to return home. We will set-up out patient Physical activity.

## 2011-01-17 NOTE — Progress Notes (Signed)
HPI The patient is an 77 year old female who returns to the office after right vats for a hemopneumothorax resulting from a fall and causing a rib fracture as well. She required a right vats and decortication and has done well. She is recovered at the [facility] skilled nursing facility. She is ambulating has not sustained a serious fall. Her complaints are mainly related to chronic constipation. She still has postthoracotomy pain on the right. Overall her condition is significantly improved and her strength is better.   Current Outpatient Prescriptions  Medication Sig Dispense Refill  . conjugated estrogens (PREMARIN) vaginal cream Place 1 g vaginally 2 (two) times a week.        . diphenhydrAMINE (BENADRYL) 25 mg capsule Take 1 capsule (25 mg total) by mouth every 6 (six) hours as needed for itching.  60 capsule  3  . divalproex (DEPAKOTE) 250 MG EC tablet Take 1 tablet (250 mg total) by mouth 2 (two) times daily.  60 tablet  11  . indapamide (LOZOL) 2.5 MG tablet Take 1/2 tablet daily as directed  15 tablet  3  . KLOR-CON M10 10 MEQ tablet TAKE 1 TABLET BY MOUTH 2 TIMES A DAY  60 tablet  5  . Lactulose 20 GM/30ML SOLN Take 30-60 mLs by mouth as needed.        Marland Kitchen LORazepam (ATIVAN) 1 MG tablet Take 1 tablet (1 mg total) by mouth every 8 (eight) hours as needed.  90 tablet  5  . methadone (DOLOPHINE) 10 MG tablet Take 1 tablet (10 mg total) by mouth 5 (five) times daily as needed. Fill on or after 01/20/11. Once every 30 days  150 tablet  0  . metoCLOPramide (REGLAN) 5 MG tablet TAKE 1 TABLET BY MOUTH 3 TIMES A DAY BEFORE MEALS  90 tablet  2  . metoprolol succinate (TOPROL-XL) 25 MG 24 hr tablet TAKE 1 TABLET BY MOUTH EVERY DAY  30 tablet  5  . nitroGLYCERIN (NITROSTAT) 0.4 MG SL tablet Place 1 tablet (0.4 mg total) under the tongue as needed.  20 tablet  6  . olmesartan (BENICAR) 20 MG tablet Take 20 mg by mouth daily. 1/1/2 tablet every day       . omeprazole (PRILOSEC) 20 MG capsule Take 20 mg  by mouth 2 (two) times daily.        . polysaccharide iron (NIFEREX) 150 MG CAPS capsule Take 150 mg by mouth daily.        . potassium chloride (KLOR-CON) 10 MEQ CR tablet Take 10 mEq by mouth daily.       . promethazine (PHENERGAN) 25 MG tablet Take 1-2 tablets (25-50 mg total) by mouth 4 (four) times daily as needed. For nausea  60 tablet  5  . triamcinolone (KENALOG) 0.1 % ointment Apply topically 2 (two) times daily.        Marland Kitchen trimethoprim (TRIMPEX) 100 MG tablet Take 1 tablet (100 mg total) by mouth daily.  30 tablet  11  . Vitamin D, Ergocalciferol, (DRISDOL) 50000 UNITS CAPS Take 50,000 Units by mouth every 30 (thirty) days.        Marland Kitchen zolpidem (AMBIEN) 10 MG tablet Take 1 tablet (10 mg total) by mouth at bedtime as needed.  30 tablet  5     Review of Systems: She denies fever productive cough or shortness of breath.   Physical Exam  Blood pressure 1 revivable rate pulse 80 regular saturation 96% on room air She is an elderly white  female and wheelchair. She has a Sport and exercise psychologist in the room. Breath sounds are clear and equal. The right vats incision is well-healed. Cardiac rhythm is regular without gallop or murmur. Neurologic status is tacked in she walked 150 feet pushing a wheelchair in the office.  Diagnostic Tests:  Chest x-ray today shows only a very minimal right pleural effusion with clear lung fields.  Impression:  Stable course following her right vats and evacuation of hemothorax.  Plan:  She returned home to the care of her husband. He will return in 3 weeks for followup chest x-ray. I spent several minutes discussing the risks she would have upon returning home and her husband clearly feels that he could take care of her safely at her current level of strength and activity.

## 2011-01-19 ENCOUNTER — Telehealth: Payer: Self-pay | Admitting: *Deleted

## 2011-01-19 NOTE — Telephone Encounter (Signed)
Caller is req Rf  of Percocet for pt .

## 2011-01-19 NOTE — Telephone Encounter (Signed)
I informed caller of below. She states pt already has this med and will be out Tues.

## 2011-01-19 NOTE — Telephone Encounter (Signed)
I was rx methadone prior. I'm not aware of percocet Thx

## 2011-01-20 NOTE — Consult Note (Signed)
[name], HUTT NO.:  [id number]  MEDICAL RECORD NO.:  [id number]  LOCATION:  3301                         FACILITY:  [facility]  PHYSICIAN:  Marca Ancona, MD      DATE OF BIRTH:  05/30/1927  DATE OF CONSULTATION: DATE OF DISCHARGE:                                CONSULTATION   PRIMARY CARE PHYSICIAN:  Georgina Quint. Plotnikov, MD.  PRIMARY CARDIOLOGIST:  Luis Abed, MD, [facility]  CHIEF COMPLAINT:  Preoperative evaluation.  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  She was evaluated by Dr. Dorris Fetch and Dr. Donata Clay, and it was felt that she would need VATS procedure in a.m.  Because of her history of coronary artery disease, Cardiology is asked to evaluate her preop.  Mr. Wike speaks some English and his wife does not say any words of English, but seems to understand some simple commands.  Her husband describes a trip and fall, but denies that she had syncope with this fall.  She has had syncope associated with falls in the past, but not this time.  Her husband indicates that she does not walk that well, but is able to get from room to room in the house without difficulty.  After the fall, she denied chest pain or shortness of breath.  They were not aware of any acute injuries until after she got to the hospital.  She has had some atypical chest pain prior to admission; but per the husband, the only chest pain she has had recently has been on the right. Dr. [name] has seen her for atypical chest pain that was left-sided, but did not feel it was cardiac.  She does not to appear to be in acute distress at this time, but does state that the Foley catheter is painful.  PAST MEDICAL HISTORY: 1. Status post aortocoronary bypass surgery at the time of  an MI in     2004 with LIMA to LAD, SVG to diagonal, SVG to OM, SVG to PDA. 2. Status post cardiac catheterization last in January 2007 showing     LAD 90, circumflex 80, and RCA occluded, all grafts were widely     patent. 3. Minimal left ventricular dysfunction with an EF of 50-55% by     echocardiogram in 2010. 4. Remote history of syncope, possibly secondary to orthostasis. 5. Allergic rhinitis. 6. History of colon polyps and diverticulosis. 7. Gastroesophageal reflux disease. 8. Abdominal pain with motility disorder, severe, but chronic     functional.  She has possible OCD with compulsive use of enemas and     laxatives, psychotic features in 2011. 9. History of a psychosomatic disorder with GI fixation. 10.Anxiety/depression. 11.History of stress incontinence. 12.Vitamin D and B12 deficiency. 13.History of UTI. 14.History of renal insufficiency, stage III with a GFR of 42 this     admission. 15.History of breast cancer. 16.Insomnia. 17.Cerebrovascular disease with right ICA 60-79% in 2010.  PAST SURGICAL HISTORY:  She is status post cath as well as bypass grafts, uterine and ovarian  myomectomies, breast fibroadenoma surgery, left hemicolectomy, and breast lumpectomy.  ALLERGIES:  She is allergic or intolerant of BACLOFEN, BELLADONNA, CIPRO, DOXYCYCLINE, ERYTHROMYCIN, GABAPENTIN, IOHEXOL, METRONIDAZOLE, NALBUPHINE, NITROFURANTOIN, PENICILLIN, PROCAN, SUCRALFATE,  SULFA, AND IV CONTRAST.  MEDICATIONS:  Medications prior to admission include: 1. Phenergan. 2. Methadone q.4 h. 3. Depakote b.i.d. 4. Vitamin D monthly. 5. Indapamide 2.5 mg, 1/2 tablet daily. 6. Ambien 10 mg q.h.s. 7. Trimethoprim 100 mg a day. 8. Triamcinolone topical. 9. Reglan t.i.d. a.c. 10.Potassium 10 mEq daily. 11.Omeprazole b.i.d. 12.Sublingual nitroglycerin p.r.n. 13.Toprol XL 25 mg b.i.d. 14.Lactulose p.r.n. 15.Benadryl p.r.n. 16.Lorazepam p.r.n.  SOCIAL HISTORY:  She lives in [place],  [place], with her husband.  She is a retired Development worker, community.  She has a daughter who lives in town and frequently helps translate for her.  She denies alcohol, tobacco, and drug abuse.  FAMILY HISTORY:  Both of her parents are deceased, but they both had a history of coronary artery disease.  REVIEW OF SYSTEMS:  She has not had fevers or chills.  She has had weakness and fatigue.  She has some dyspnea on exertion.  She has chronic abdominal pain and problems with constipation.  She has had some chest pain, most recently right-sided.  Full 14-point review of systems is otherwise negative except as stated in the HPI.  PHYSICAL EXAMINATION:  VITAL SIGNS: Temperature is 98.4, blood pressure 145/72, heart rate 80, respiratory rate 22, O2 saturation 96% on room air. GENERAL:  She is a well-developed elderly white female in no acute distress at rest. HEENT:  Normal for age and she is edentulous. NECK:  There is no lymphadenopathy, thyromegaly, bruit, or JVD noted. CARDIOVASCULAR:  Her heart is regular in rate and rhythm with an S1-S2 and a systolic murmur is noted at a 1/6.  Distal pulses are intact in all four extremities. LUNGS:  There are decreased breath sounds two-thirds up on the right. SKIN:  She has a healing lesion on her left lower extremity and ecchymosis on her chest wall as well. ABDOMEN:  Soft and nontender with active bowel sounds. EXTREMITIES:  There is no cyanosis or clubbing and trace bilateral ankle edema is noted. MUSCULOSKELETAL:  There is no joint deformity or effusions and her right lateral and posterior chest wall is tender. NEUROLOGIC:  She appears to be alert and oriented with cranial nerves II through XII grossly intact.  LABORATORY DATA:  CT angiogram of the chest showed large right-sided pleural effusion occupying much of the right hemithorax with irregular areas of high attenuation, which could reflect a large amount of partially organized blood clots or  debris.  If debris, an underlying mass would be suspected, but there are no signs of metastatic or nodal disease, mild airway disease involving the expanded portions of the right lung, possibly pneumonia.  There are partially healed fractures of the right anterolateral fifth, sixth, and seventh ribs.  EKG is sinus rhythm, rate 77, with no acute ischemic changes.  Laboratory values; hemoglobin 8.1, hematocrit 23, WBCs 10.2, platelets 334.  Sodium 123, potassium 4.7, chloride 89, CO2 28, BUN 19, creatinine 1.24, glucose 150.  CK-MB and troponin negative x1.  IMPRESSION:  Ms. Lebon was seen today by Dr. Shirlee Latch, the patient evaluated and the data reviewed.  She has a history of coronary artery disease status post coronary artery bypass graft and patent grafts were noted in 2007.  She has chronic abdominal pain with chronic chest pain, thought to be noncardiac.  She presented with lightheadedness and lethargy  status post fall three or four days ago.  She has a right hemithorax and is planned for that in a.m.Marland Kitchen  She also has hyponatremia.  1. Preoperative cardiac evaluation:  She is independent at home,     though frail.  There is no recent chest pain other than right rib     pain after her fall with fractures.  We would avoid aspirin for now     with the hemathorax and anemia.  We agree with continuing her low     dose metoprolol as her blood pressure has now improved.  An     echocardiogram has been ordered.  We do not think she would benefit     from a stress test. 2. Fall:  It seems mechanical, not secondary to syncope as she     apparently tripped. 3. Hyponatremia:  She appears euvolemic today.  We will use gentle     saline hydration and PO fluid restriction.  Her sodium will be     followed closely.  She will need a urine sodium checked and we     would also agree with evaluation for recurrent cancer as a call.     We will discontinue the indapamide as this can affect her  sodium.     If she has problems with extra volume, a loop diuretic instead of a     thiazide diuretic may be more helpful.     Theodore Demark, PA-C   ______________________________ Marca Ancona, MD    RB/MEDQ  D:  12/25/2010  T:  12/25/2010  Job:  [id number]  Electronically Signed by Theodore Demark PA-C on 01/11/2011 03:19:36 PM Electronically Signed by Marca Ancona MD on 01/20/2011 11:30:57 PM

## 2011-01-22 NOTE — Telephone Encounter (Signed)
What is she on now and who prescribed it? Thx

## 2011-01-23 NOTE — Telephone Encounter (Signed)
Left mess for patient to call back.  

## 2011-01-25 ENCOUNTER — Telehealth: Payer: Self-pay | Admitting: *Deleted

## 2011-01-25 NOTE — Telephone Encounter (Signed)
Seems pt has bladder infection. Requesting order for Clean catch UA and In/Out cath to obtain specimen. Verbal order given.

## 2011-01-26 ENCOUNTER — Ambulatory Visit (INDEPENDENT_AMBULATORY_CARE_PROVIDER_SITE_OTHER): Payer: Medicare Other | Admitting: Internal Medicine

## 2011-01-26 ENCOUNTER — Encounter: Payer: Self-pay | Admitting: Internal Medicine

## 2011-01-26 VITALS — BP 92/52 | HR 68 | Temp 97.7°F | Resp 16 | Wt 137.0 lb

## 2011-01-26 DIAGNOSIS — J942 Hemothorax: Secondary | ICD-10-CM

## 2011-01-26 DIAGNOSIS — J90 Pleural effusion, not elsewhere classified: Secondary | ICD-10-CM

## 2011-01-26 DIAGNOSIS — F329 Major depressive disorder, single episode, unspecified: Secondary | ICD-10-CM

## 2011-01-26 DIAGNOSIS — F411 Generalized anxiety disorder: Secondary | ICD-10-CM

## 2011-01-26 DIAGNOSIS — F429 Obsessive-compulsive disorder, unspecified: Secondary | ICD-10-CM

## 2011-01-26 DIAGNOSIS — G894 Chronic pain syndrome: Secondary | ICD-10-CM

## 2011-01-26 DIAGNOSIS — D509 Iron deficiency anemia, unspecified: Secondary | ICD-10-CM

## 2011-01-26 MED ORDER — FE BISGLYCINATE-FE POLYSAC 40-20 MG PO CAPS: 1.0000 | ORAL_CAPSULE | Freq: Three times a day (TID) | ORAL | Status: DC

## 2011-01-26 MED ORDER — LORAZEPAM 1 MG PO TABS: 1.0000 mg | ORAL_TABLET | Freq: Three times a day (TID) | ORAL | Status: DC | PRN

## 2011-01-26 MED ORDER — METHADONE HCL 10 MG PO TABS: 10.0000 mg | ORAL_TABLET | Freq: Every day | ORAL | Status: DC | PRN

## 2011-01-26 NOTE — Assessment & Plan Note (Signed)
Continue with current prescription therapy as reflected on the Med list.  

## 2011-01-26 NOTE — Telephone Encounter (Signed)
Pt has OV today. Closing this phone note

## 2011-01-26 NOTE — Assessment & Plan Note (Signed)
Recovering  

## 2011-01-26 NOTE — Assessment & Plan Note (Signed)
PO iron Check labs

## 2011-01-26 NOTE — Progress Notes (Signed)
  Subjective:    Patient ID: [name], female    DOB: 1927/08/08, 77 y.o.   MRN: [id number]  HPI   The patient is here to follow up on chronic depression, anxiety, headaches and chronic abd pain symptoms controlled with medicines, OCD with enemas overuse. She was hospitalized x 11 d for hemopneumothorax.   Review of Systems  Constitutional: Positive for fatigue. Negative for fever, chills, diaphoresis, activity change, appetite change and unexpected weight change.  HENT: Negative for hearing loss, ear pain, congestion, sore throat, sneezing, mouth sores, neck pain, dental problem, voice change, postnasal drip and sinus pressure.   Eyes: Negative for pain and visual disturbance.  Respiratory: Negative for cough, chest tightness, wheezing and stridor.   Cardiovascular: Negative for chest pain, palpitations and leg swelling.  Gastrointestinal: Positive for abdominal pain and abdominal distention. Negative for nausea, vomiting, blood in stool and rectal pain.  Genitourinary: Negative for dysuria, hematuria, decreased urine volume, vaginal bleeding, vaginal discharge, difficulty urinating, vaginal pain and menstrual problem.  Musculoskeletal: Negative for back pain, joint swelling and gait problem.  Skin: Negative for color change, rash and wound.  Neurological: Negative for dizziness, tremors, syncope, speech difficulty and light-headedness.  Hematological: Negative for adenopathy.  Psychiatric/Behavioral: Positive for sleep disturbance and dysphoric mood. Negative for suicidal ideas, hallucinations, behavioral problems, confusion and decreased concentration. The patient is nervous/anxious. The patient is not hyperactive.        Objective:   Physical Exam  Constitutional: She appears well-developed. No distress.       Chron ill appearing  HENT:  Head: Normocephalic.  Right Ear: External ear normal.  Left Ear: External ear normal.  Nose: Nose normal.  Mouth/Throat:  Oropharynx is clear and moist.  Eyes: Conjunctivae are normal. Pupils are equal, round, and reactive to light. Right eye exhibits no discharge. Left eye exhibits no discharge.  Neck: Normal range of motion. Neck supple. No JVD present. No tracheal deviation present. No thyromegaly present.  Cardiovascular: Normal rate, regular rhythm and normal heart sounds.   Pulmonary/Chest: No stridor. No respiratory distress. She has no wheezes. She exhibits tenderness (R).  Abdominal: Soft. Bowel sounds are normal. She exhibits no distension and no mass. There is tenderness. There is no rebound and no guarding.  Musculoskeletal: She exhibits no edema and no tenderness.  Lymphadenopathy:    She has no cervical adenopathy.  Neurological: She displays normal reflexes. No cranial nerve deficit. She exhibits normal muscle tone. Coordination normal.  Skin: No rash noted. No erythema.  Psychiatric: She has a normal mood and affect. Her behavior is normal. Judgment and thought content normal.          Assessment & Plan:   A complex case. Hospital records, labs and tests were reviewed.

## 2011-01-29 ENCOUNTER — Other Ambulatory Visit: Payer: Self-pay | Admitting: Oncology

## 2011-01-29 ENCOUNTER — Encounter (HOSPITAL_BASED_OUTPATIENT_CLINIC_OR_DEPARTMENT_OTHER): Payer: Medicare Other | Admitting: Oncology

## 2011-01-29 DIAGNOSIS — C50919 Malignant neoplasm of unspecified site of unspecified female breast: Secondary | ICD-10-CM

## 2011-01-29 DIAGNOSIS — R52 Pain, unspecified: Secondary | ICD-10-CM

## 2011-01-29 DIAGNOSIS — C50219 Malignant neoplasm of upper-inner quadrant of unspecified female breast: Secondary | ICD-10-CM

## 2011-01-29 LAB — CBC WITH DIFFERENTIAL/PLATELET
EOS%: 7.5 % — ABNORMAL HIGH (ref 0.0–7.0)
HGB: 10.6 g/dL — ABNORMAL LOW (ref 11.6–15.9)
MCH: 30.8 pg (ref 25.1–34.0)
MCV: 89.9 fL (ref 79.5–101.0)
MONO%: 9.3 % (ref 0.0–14.0)
NEUT#: 4.5 10*3/uL (ref 1.5–6.5)
RBC: 3.43 10*6/uL — ABNORMAL LOW (ref 3.70–5.45)
RDW: 16 % — ABNORMAL HIGH (ref 11.2–14.5)
lymph#: 1.5 10*3/uL (ref 0.9–3.3)

## 2011-01-29 LAB — COMPREHENSIVE METABOLIC PANEL
ALT: 23 U/L (ref 0–35)
AST: 32 U/L (ref 0–37)
Albumin: 3.3 g/dL — ABNORMAL LOW (ref 3.5–5.2)
Alkaline Phosphatase: 125 U/L — ABNORMAL HIGH (ref 39–117)
Calcium: 8.6 mg/dL (ref 8.4–10.5)
Chloride: 101 mEq/L (ref 96–112)
Potassium: 4.3 mEq/L (ref 3.5–5.3)
Sodium: 134 mEq/L — ABNORMAL LOW (ref 135–145)
Total Protein: 5.7 g/dL — ABNORMAL LOW (ref 6.0–8.3)

## 2011-01-31 ENCOUNTER — Telehealth: Payer: Self-pay | Admitting: *Deleted

## 2011-01-31 NOTE — Telephone Encounter (Signed)
Per pharm - Niferex is on back order, they are req alt option.

## 2011-01-31 NOTE — Telephone Encounter (Signed)
Ok - pls give what they have Thx

## 2011-02-01 NOTE — Telephone Encounter (Signed)
Gave verbal to pharm, they will use generic ferrex.

## 2011-02-02 ENCOUNTER — Other Ambulatory Visit: Payer: Self-pay | Admitting: Cardiothoracic Surgery

## 2011-02-02 DIAGNOSIS — S271XXA Traumatic hemothorax, initial encounter: Secondary | ICD-10-CM

## 2011-02-02 LAB — COMPREHENSIVE METABOLIC PANEL
ALT: 21
AST: 25
Albumin: 3.5
CO2: 27
Calcium: 8.7
Chloride: 94 — ABNORMAL LOW
GFR calc Af Amer: 46 — ABNORMAL LOW
GFR calc non Af Amer: 38 — ABNORMAL LOW
Sodium: 129 — ABNORMAL LOW

## 2011-02-02 LAB — URINALYSIS, ROUTINE W REFLEX MICROSCOPIC
Bilirubin Urine: NEGATIVE
Hgb urine dipstick: NEGATIVE
Ketones, ur: NEGATIVE
Protein, ur: NEGATIVE
Urobilinogen, UA: 0.2

## 2011-02-02 LAB — CBC
MCHC: 33.5
Platelets: 204
RBC: 4.14
WBC: 7.3

## 2011-02-02 LAB — DIFFERENTIAL
Eosinophils Absolute: 0.3
Eosinophils Relative: 5
Lymphs Abs: 1.6
Monocytes Absolute: 0.8
Monocytes Relative: 11

## 2011-02-02 LAB — URINE MICROSCOPIC-ADD ON

## 2011-02-06 DIAGNOSIS — I25810 Atherosclerosis of coronary artery bypass graft(s) without angina pectoris: Secondary | ICD-10-CM

## 2011-02-06 DIAGNOSIS — L89309 Pressure ulcer of unspecified buttock, unspecified stage: Secondary | ICD-10-CM

## 2011-02-06 DIAGNOSIS — L8992 Pressure ulcer of unspecified site, stage 2: Secondary | ICD-10-CM

## 2011-02-06 DIAGNOSIS — I10 Essential (primary) hypertension: Secondary | ICD-10-CM

## 2011-02-07 ENCOUNTER — Encounter: Payer: Self-pay | Admitting: Cardiothoracic Surgery

## 2011-02-07 ENCOUNTER — Ambulatory Visit
Admission: RE | Admit: 2011-02-07 | Discharge: 2011-02-07 | Disposition: A | Discharge status: Home / Self Care | Payer: Medicare Other | Source: Ambulatory Visit | Attending: Cardiothoracic Surgery | Admitting: Cardiothoracic Surgery

## 2011-02-07 ENCOUNTER — Ambulatory Visit (INDEPENDENT_AMBULATORY_CARE_PROVIDER_SITE_OTHER): Payer: Self-pay | Admitting: Cardiothoracic Surgery

## 2011-02-07 VITALS — BP 120/71 | HR 76 | Resp 20 | Ht 65.0 in | Wt 145.0 lb

## 2011-02-07 DIAGNOSIS — Z09 Encounter for follow-up examination after completed treatment for conditions other than malignant neoplasm: Secondary | ICD-10-CM

## 2011-02-07 DIAGNOSIS — J90 Pleural effusion, not elsewhere classified: Secondary | ICD-10-CM

## 2011-02-07 DIAGNOSIS — S271XXA Traumatic hemothorax, initial encounter: Secondary | ICD-10-CM

## 2011-02-07 LAB — AFB CULTURE WITH SMEAR (NOT AT ARMC): Acid Fast Smear: NONE SEEN

## 2011-02-07 NOTE — Patient Instructions (Signed)
Continue with home physical thearapy

## 2011-02-07 NOTE — Progress Notes (Signed)
[name], Young NO.:  [id number]  MEDICAL RECORD NO.:  [id number]  LOCATION:  2017                         FACILITY:  [facility]  PHYSICIAN:  Thad Ranger, MD       DATE OF BIRTH:  1927-05-20                                PROGRESS NOTE   ADMITTING PHYSICIAN: Carlota Raspberry, MD  PRIMARY CARE PHYSICIAN: Georgina Quint. Plotnikov, MD  CONSULTANTS THIS ADMISSION.: 1. Dr.  Dorris Fetch with Cardiothoracic Surgery. 2. Dr. Marca Ancona with Cardiology.  PROCEDURES THIS ADMISSION.: Right VATS with mini thoracotomy with evacuation of right pleural space hematoma and decortication right lower lobe as well as placement of On-Q wound irrigation analgesia system by Dr. Kathlee Nations Trigt .  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  She also has chronic abdominal pain, nausea, diverticulitis, chronic bladder urgency with recurrent UTIs, who is on chronic methadone therapy.  She also has issues with chronic constipation due to the above-stated problems.  She presented to the ER with complaints of fatigue, malaise and was found to have hypotension.  In addition, she was found to be anemic with new decrease in hematocrit down to 22 from a baseline of between 32 and 35.  History obtained from the family endorses that the patient has been more fatigued and restless and more muscle spasm and having vomiting and an episode of right-sided chest pain.  Apparently, at some point prior to the onset of the symptoms, it seems the patient fell.  In the [facility] ER, the patient's blood pressure was 81/48 with a pulse of 76, respirations 14 and she was afebrile.  She was given a 500 mL bolus of normal saline and started on maintenance IV fluid and by the time Dr. Kaylyn Layer evaluated the patient, her blood pressure was between 120 and 130 systolic.  With improvement in  blood pressure, the patient's mental status improved as well.  In addition to the anemia, the patient was found to have a leukocytosis of 12,200 with a left shift, but no bandemia.  No thrombocytopenia.  No coagulopathy.  LFTs were normal. Cardiac enzymes were normal x1.  Urinalysis was consistent with a probable UTI.  CT of the head was negative.  Acute abdominal films were negative.  Chest x-ray showed what was called as a right small pleural effusion and question of right basilar and perihilar space opacification concerning for pneumonia.  Because of these findings, she was empirically started on ceftriaxone and azithromycin to cover community- acquired pneumonia as well as to cover any potential UTI.  ADMITTING LABORATORY: Admitting laboratory values white count 12,200, hemoglobin 7.8, hematocrit 22.3, platelets 338,000, neutrophils 80%.  Lactic acid 1.3, sodium 123, potassium 4.7, chloride 89, CO2 28, glucose 150, BUN 19, creatinine 1.24.  LFTs are normal.  Serum lipase 7.  Point of care enzymes were normal.  Urinalysis was abnormal with negative nitrite, but small amount of leukocytes, cloudy appearance, 11-20 wbc's and many bacteria.  Fecal occult blood was negative.  PT 13.6, INR 1.02 with a PTT of 25.  ADMITTING DIAGNOSTICS: 1. CT of the head December 25, 2010, no acute intracranial pathology,     mild-to-moderate cortical volume loss and diffuse small vessel     ischemic microangiopathy.  Chronic encephalomalacia within the     posterior left parietal lobe. 2. Acute abdominal series with a chest x-ray December 25, 2010, shows     the lungs to be hyperexpanded with a small right pleural effusion     question of right basilar and perihilar airspace opacification     concerning for pneumonia.  Otherwise, unremarkable bowel gas     pattern.  No free air. 3. EKG December 25, 2010, shows sinus rhythm.  No ischemic ST-T wave     changes, RR prime in leads III and aVF, ventricular rate  77, QTc     435 milliseconds.  ADMITTING DIAGNOSES: 1. Hypotension, unclear etiology possible due to acute blood loss     anemia given significant decrease from baseline hemoglobin. 2. Leukocytosis multifactorial likely related to urinary tract     infection and possibly related to stress demargination from acute     blood loss anemia and ongoing hypotension. 3. Hyponatremia and hypochloremia lower than baseline 130, suspected     related to poor p.o. intake, further evaluation indicated. 4. Question of community-acquired pneumonia versus pleural effusion.  PAST MEDICAL HISTORY: 1. CAD, MI in 2004, with cardiac cath 2007, subsequent coronary     stents; four-vessel CABG in 2004. 2. Chronic abdominal pain and nausea, multifactorial due to history of     diverticulosis, diverticulitis as well as urinary retention and     bladder urgency and recurrent UTI.  3.  GERD. 3. Hyponatremia and hypokalemia, baseline sodium 130. 4. Dyslipidemia. 5. Allergic rhinitis. 6. Colonic polyps. 7. History of prior TB. 8. Vitamin D deficiency. 9. Breast cancer post lumpectomy in 2011.  DIAGNOSTICS SINCE ADMISSION: 1. CT of the chest December 25, 2010, that demonstrates a large right-     sided pleural effusion occupying much of the right hemithorax new     from prior studies, also irregular areas of high attenuation noted     within the pleural effusion.  This is concerning for either     necrotizing pneumonia and empyema versus overnight blood clot or     debris within the pleural effusion.  No signs of metastatic or     nodal disease.  There was also found to be mild airspace disease     involving the expanded portions of the right lung raising concern     for superimposed pneumonia.  Incidental findings of diffuse     coronary artery calcifications as well as partially healed     fractures of the right anterolateral fifth, sixth and seventh ribs.     There is also a note from the radiologist that  it is possible that     one of these displaced rib fractures could have caused a large     right-sided hemothorax with subsequent organization of blood clot,     clinical correlation was recommended.  Also the radiologist makes     note that the right-sided pleural effusion would be amendable to     drainage more superiorly in order to avoid the more inferior areas     of high attenuation in case there is underlying collapsed lung. 2. Portable chest x-ray December 26, 2010, shows interval placement of     the right subclavian catheter tip of her SVC, no pneumothorax as     well as a small right pleural  effusion.  Most recent portable chest     x-ray on December 28, 2010, demonstrates right apical chest tube     stable in position.  No pneumothorax, slight interval decrease in     size of small right pleural effusion. 3. Potable chest x-ray on December 28, 2010, post removal of chest tube     is pending. 4. Two-D echocardiogram December 25, 2010, that shows LV size normal,     wall thickness normal, systolic function normal with estimated     ejection fraction 60-65%.  There was mild mitral regurgitation and     the left atrium was mildly dilated.  LABORATORY SINCE ADMISSION: MRSA PCR screening was negative.  A random sodium was obtained on December 25, 2010, that was 14.  Anemia panel on December 25, 2010, shows iron high at 190, TIBC and percent were not calculated, UIBC less than 55, vitamin B12 503, serum folate 3.4, ferritin 292.  Blood cultures from December 25, 2010, showed no growth to date.  Urine culture on December 25, 2010, shows greater than 100,000 colonies of Klebsiella pneumoniae resistant to ampicillin and TMS.  Body fluid cultures as well as cultures for AFB from pleural fluid intraoperatively as well as tissue cultures are all pending, no growth to date.  Labs as of December 28, 2010, sodium 127, potassium 3.8, chloride 91, CO2 27, glucose 127, BUN 10, creatinine 0.85.  LFTs are  normal.  Albumin is low at 2.1, magnesium is 1.9 and phosphorus is 4.0.  CBC shows a white count of 10,400, hemoglobin stable at 8.8, hematocrit 25.8, and platelets 213,000.  HOSPITAL COURSE: 1. Hypotension - multifactorial etiologies.     Initially, it was related to a combination of acute blood loss     anemia due to right hemothorax as well as due to volume depletion     from poor p.o. intake prior to admission.  Postoperatively, the     patient's blood pressure has been soft, but mostly greater than 90     systolic.  Today, post removal of chest tube and after receiving     sedation as well as several Lasix doses 24 hours prior, her blood pressure was     noted to be in the 70's systolic supine position, therefore 500 mL saline     bolus has been given and fluids have been ordered at 100 an hour     with plans to re-bolus and/or readjust pending resolution of     hypotension.  In addition, we will go ahead and hold the metoprolol     today if this is not already been done.  Otherwise, we will place     hold parameters on the metoprolol.  It was also noted yesterday     that with mobilization the patient was orthostatic with a systolic     blood pressure down to 60.  This is all consistent with volume     depletion.  Please note, hemoglobin stable. ADDENEUM: BP was minimally     responsive to aggressive hydration so she was transferred to the     ICU for further monitoring and possible initiation of pressors.     PCCM has laso been re-consulted. I spoke with Dr. Molli Knock who is familiar     with this patient.  2. Right hemothorax after fall with rib fractures.  The patient was     determined to have a right hemothorax based on CT of the chest.  Initially, Pulmonary Critical Care Medicine was consulted for     possible thoracentesis but due to the complexity of the fluid     collection and suspicion for significant amount of clotted blood     thoracentesis was not indicated.   Cardiothoracic Surgery was     consulted and the patient underwent a VATS procedure this admission     by Dr. Donata Clay, 1700 mL of old blood were removed.  She has been     stable from a cardiothoracic surgical standpoint.  She was     transferred out of ICU on December 27, 2010, to the telemetry unit.     Her chest tube was removed today by Cardiothoracic Surgery and     followup chest x-ray to rule out pneumothorax is pending.  Pain     management regarding acute pain issues from surgery or per     Cardiothoracic Surgery. 3. Acute hypoxemic respiratory failure secondary to hemothorax.  This     appears to be resolved.  The patient is on room air at 93%.  There     was some question on the CT that she may have pneumonia in the     areas of the lung not affected by the hemathorax.  She did receive     Rocephin and Zithromax and then Zinacef over 3 days and does not     appear to have any pneumonia symptoms at this time. 4. Acute blood loss anemia secondary to right hemothorax.  The patient     has received blood this admission x 2 units.  Her hemoglobin is     stable in the higher 8 range.  Her baseline hemoglobin is 10.7.  An     anemia panel was obtained that shows a mildly elevated iron level,     this is most likely due to hemolysis due to the hematoma.  Please     note that the hemothorax was suspected to be secondary to displaced     rib fractures on the right side as noted on the CT of the chest. 5. Apparent fall likely recurrent syncope.  The patient was admitted     in March with a mild syncopal episode.  At that time, it was felt     to be related to over use of pain medications in the setting of     autonomic dysfunction.  Most likely all of this is mediated by     cascade of problems related to frequent pain medication use leading     to constipation, leading to poor p.o. intake, leading to     hypovolemia, concomitant use of diuretics and antihypertensive     medications.  We  will go ahead given the patient's history of     vascular disease and check carotid Dopplers to ensure there is no     carotid disease contributing to these symptoms.  As previously     mentioned regarding hypotension, the patient has orthostatic     hypotension yesterday and has supine hypotension today, most likely     related to volume depletion.  Therefore, we will treat reversible     causes such as this.  PT and OT are also following the patient to     ensure other issues are not contributing to falls from a mechanical     standpoint.  I did have a long discussion with the patient's     husband and daughter today and he  did endorse that the patient     takes methadone 5 times a day and according to his understanding,     he feels she takes too much medication.  She also has issues     related to chronic constipation for many years and according to the     daughter enemas were best.  She was on lactulose p.r.n. at home.     We will go ahead and change that to daily dose and I have added     Colace and MiraLax.  I have also lifted a previously and post 1500     mL fluid restriction. 6. Klebsiella UTI.  This is pansensitive except for resistance to TMS     and ampicillin.  We are now on Cipro day #2. 7. Hyponatremia.  The patient has a history of chronic hyponatremia     with sodiums in the 130 most likely related to her use of thiazide     diuretic prior to admission.  She was on indapamide.  At the     present time, it is unclear as if her hyponatremia is related to     volume depletion versus SIADH.  She had a random urine sodium     checked at admission and was placed on 1500 mL fluid restriction by     Cardiology but no other SIADH labs were obtained to clarify and to     assist with obtaining a fractional excretion of sodium level.  She     has also received three doses of Lasix in the past 24 hours.     Therefore, we cannot obtain SIADH labs for at least another 48     hours.   Recommend checking these labs in the next 48 hours.  She is     also receiving IV fluid with saline due to symptomatic hypotension. 8. Hypertension and CAD.  From an ischemic standpoint, the patient has     remained stable this admission.  Echo shows no regional wall motion     abnormalities.  Enzymes have been negative and EKG has been     negative.  Her blood pressures are soft given her volume depletion     and she is normally on metoprolol.  We may need to decrease that     dose from 25 mg b.i.d. to 12.5 mg b.i.d.  We will go ahead and hold     today's dose as it had not already given.  We will also place hold     parameters of not to give unless systolic blood pressure is at     least 100 supine.  DISPOSITION: Since hypotension persists, she has been transferred to the ICU she will remain on Team 8 or pending condition she may end up on PCCM service.     Allison L. Rennis Harding, N.P.   ______________________________ Thad Ranger, MD    ALE/MEDQ  D:  12/28/2010  T:  12/28/2010  Job:  [id number]  Electronically Signed by Junious Silk N.P. on 12/28/2010 01:44:21 PM Electronically Signed by Andres Labrum RAI  on 02/07/2011 03:23:46 PM

## 2011-02-07 NOTE — Progress Notes (Signed)
HPI The patient is an 76 year old female returns for her first office visit after right vats and decortication of hemothorax. She presented after falling at home with right seventh rib fracture and a loculated hemothorax. Operative cultures were negative and she had a good result from the decortication and drainage of hemothorax. The patient has done well at home and has not fallen.  The patient is walking in homeandphysical therapy is providing rehabilitation. The surgical incisions are healing well. She still has some incisional pain and has requested more Percocet as needed.   Current Outpatient Prescriptions  Medication Sig Dispense Refill  . conjugated estrogens (PREMARIN) vaginal cream Place 1 g vaginally 2 (two) times a week.        . diphenhydrAMINE (BENADRYL) 25 mg capsule Take 1 capsule (25 mg total) by mouth every 6 (six) hours as needed for itching.  60 capsule  3  . indapamide (LOZOL) 2.5 MG tablet Take 1/2 tablet daily as directed  15 tablet  3  . KLOR-CON M10 10 MEQ tablet TAKE 1 TABLET BY MOUTH 2 TIMES A DAY  60 tablet  5  . Lactulose 20 GM/30ML SOLN Take 30-60 mLs by mouth as needed.        Marland Kitchen LORazepam (ATIVAN) 1 MG tablet Take 1 tablet (1 mg total) by mouth every 8 (eight) hours as needed.  90 tablet  1  . methadone (DOLOPHINE) 10 MG tablet Take 1 tablet (10 mg total) by mouth 5 (five) times daily as needed. Fill on or after 02/25/11. Once every 30 days  150 tablet  0  . metoCLOPramide (REGLAN) 5 MG tablet TAKE 1 TABLET BY MOUTH 3 TIMES A DAY BEFORE MEALS  90 tablet  2  . metoprolol succinate (TOPROL-XL) 25 MG 24 hr tablet TAKE 1 TABLET BY MOUTH EVERY DAY  30 tablet  5  . nitroGLYCERIN (NITROSTAT) 0.4 MG SL tablet Place 1 tablet (0.4 mg total) under the tongue as needed.  20 tablet  6  . omeprazole (PRILOSEC) 20 MG capsule Take 20 mg by mouth 2 (two) times daily.        . promethazine (PHENERGAN) 25 MG tablet Take 1-2 tablets (25-50 mg total) by mouth 4 (four) times daily as  needed. For nausea  60 tablet  5  . triamcinolone (KENALOG) 0.1 % ointment Apply topically 2 (two) times daily.        Marland Kitchen trimethoprim (TRIMPEX) 100 MG tablet Take 1 tablet (100 mg total) by mouth daily.  30 tablet  11  . zolpidem (AMBIEN) 10 MG tablet Take 1 tablet (10 mg total) by mouth at bedtime as needed.  30 tablet  5  . polysaccharide iron (NIFEREX) 150 MG CAPS capsule Take 150 mg by mouth daily.           Review of Systems: The patient denies fever. Overall strength is improved.   Physical Exam Blood pressure 120/70 pulse 76 regular oxygen saturation 95% on room air. General appearance is an elderly [name] female alert and responsive. A [name] translator to sit with the exam and interview. Breast sounds are clear and equal. The right vats incision is well-healed. Rhythm is regular murmur or gallop. There is mild leftl pedal edema   Diagnostic Tests: Chest x-ray today shows no residual oral effusion on the right. No significant airspace disease. Rib fractures appear to be healing and nondisplaced.  Impression: Stable course following right vats decortication of hemothorax following a fall at home.   Plan: The patient will  continue home PT. I'll see her back in 5 or 6 weeks with a followup chest x-ray. She was provided with a prescription for Percocet 5.0 #25 tablets.

## 2011-02-07 NOTE — H&P (Signed)
[name], [name] NO.:  [id number]  MEDICAL RECORD NO.:  [id number]  LOCATION:  [facility]                         FACILITY:  [facility]  PHYSICIAN:  Carlota Raspberry, MD         DATE OF BIRTH:  [birth year]  DATE OF ADMISSION:  12/25/2010 DATE OF DISCHARGE:                             HISTORY & PHYSICAL   PRIMARY CARE PHYSICIAN:  Georgina Quint. Plotnikov, MD  CHIEF COMPLAINT:  Fatigue and malaise and hypotension.  HISTORY OF PRESENT ILLNESS:  This 77 year old female with a history of inferior MI 2004, status post 4-vessel CABG with patent stents in 2007, chronic abdominal pain and nausea, diverticulosis/diverticulitis status post left hemicolectomy, chronic bladder urgency, and complicated history of UTIs, presents with 1-2 weeks of increased fatigue and lethargy and was found to have a hematocrit of 22.3 down from her baseline of 32-35.  Her family states that over the last couple of weeks she has been not only more fatigued but also more restless and having more muscle spasms and then today also vomited and was having an episode of right-sided chest pain.  They also noted that she appears a little bit more pale as well.  Around about 1:00 a.m. this morning, her husband found her pale, sweaty while she was sleeping and took her blood pressure and found it to be 95-100 down from its baseline in the 140s. EMS was called and they found her blood pressure to be 80s.  She was brought to [facility] where initial blood pressure was 81/48 with a pulse of 76, respiratory rate 14,and a temperature of 97.8.  She was given a bolus of 500 of normal saline and started on maintenance fluids and her blood pressures promptly improved and have been in the 120s-130s ever since. With improvement in her blood pressure came improvement with her mental status as well and she was a lot more oriented and alert.  Her workup in the emergency room has revealed a leukocytosis of 12.2 with increased  neutrophils, but no bandemia, the hematocrit as above but no thrombocytopenia and no coagulopathy.  Her chemistry panel is significant for hyponatremia and hyperchloremia with normal renal function.  Her LFTs are completely normal including a T bili (indicating no hemolysis).  She was negative for cardiac enzymes x1.  A UA showed pyuria and bacteriuria but with minimal leukocyte esterase and no nitrites.  Urine culture is currently pending.  Her EKG was unremarkable for any acute ischemic process and she is in normal sinus rhythm.  A CT of her head showed no acute process.  An acute abdominal plain film showed an unremarkable bowel gas pattern and her chest x-ray showed hyperexpanded lungs and was called as a small right pleural effusion, question of right basilar and perihilar air space opacification concerning for pneumonia.  Therefore she was started on ceftriaxone and azithromycin to cover for a potential community-acquired pneumonia and also cover any potential urinary tract infection process but was going on.  Most recent vital signs in the emergency room are 124/50, pulse 71, respiratory rate 16, and she is satting in the very high 90s on 2 liters of nasal cannula.  REVIEW OF SYSTEMS:  As above.  However, also positive for chronic constipation for which her husband helps administer laxatives and enemas.  Her husband and daughter also state that she has chronic abdominal pain and nausea and also chronic bladder urgency and bladder issues and she also has a known history of chronic complicated urinary tract infections.  However, her husband and her daughter state that both of these issues are currently at baseline.  Review of systems is negative for fevers, chills, night sweats, shortness of breath, cough, any HEENT problems, abdominal pain and most importantly there has been no sign of any external blood loss anywhere.  Review of systems is otherwise negative for all other  systems.  PAST MEDICAL HISTORY: 1. Coronary artery disease status post inferior MI in 2004 with 4-     vessel CABG with repeat cardiac cath in 2007 showing patent grafts. 2. Chronic abdominal pain and nausea. 3. Diverticulosis/diverticulitis with status post left hemicolectomy. 4. GERD. 5. History of hyponatremia and hypokalemia. 6. History of urinary retention, bladder urgency and complicated     history of urinary tract infections followed by Dr. Vonita Moss. 7. Hyperlipidemia. 8. Allergic rhinitis. 9. History of colonic polyps. 10.History of tuberculosis. 11.History of vitamin D deficiency. 12.Breast cancer:  Her daughter and husband describe that she had     early stage breast cancer in 2011 and had a lumpectomy and was     treated with what sounds like an oral medication for this.     However, our e-chart has no documentation of this.  ALLERGIES:  Are extensive and include: 1. LIDOCAINE. 2. PENICILLINS WHICH CAUSE RASH. 3. DOXYCYCLINE CAUSES RASH. 4. CIPROFLOXACIN CAUSES RASH. 5. MEPERIDINE CAUSES ITCHING. 6. SULFA CAUSES RASH. 7. PROCAINE CAUSES RASH. 8. NALBUPHINE CAUSES RASH. 9. IV CONTRAST MEDIA. 10.ERYTHROMYCIN.  Home medication list as reconciled by the pharmacy tech includes: 1. Promethazine 25 mg 1-2 tablets 4 times daily as needed. 2. Methadone 10 mg 1 tablet takes 5 times daily as needed for pain.     Family says she takes it every 4 hours. 3. Depakote. 4. Divalproex 250 mg twice daily. 5. Vitamin D 50,000 units 1 tablet monthly. 6. Indapamide 2.5 mg 0.5 tablet daily. 7. Ambien 1 tablet daily at bedtime as needed. 8. Trimethoprim 100 mg 1 tablet daily. 9. Triamcinolone topical ointment 0.1%. topical twice daily. 10.Reglan 5 mg three times daily before meals. 11.Potassium chloride 10 mEq 1 tablet daily. 12.Omeprazole 20 mg twice daily. 13.Sublingual nitroglycerin as needed. 14.Metoprolol XL 25 mg twice daily. 15.Lactulose 20 g/30 mL solution daily as  needed. 16.Benadryl 25 mg every 6 hours as needed. 17.Lorazepam 1 mg every 8 hours as needed.  SOCIAL HISTORY:  She lives in a retirement home with her elderly 55 year old husband but still lives a somewhat active life and is able to walk up and down and do some activities of daily living.  She is a never smoker.  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  She does not drink any alcohol.  She has 2 daughters and a son.  FAMILY HISTORY:  Noncontributory.  PHYSICAL EXAMINATION:  VITAL SIGNS:  She is afebrile through her stay in the emergency room.  Blood pressure at present is 132/70 and has been ranging 117-132 systolics for the past hour.  Her pulse is 71, pulse ox is 98% on 2 L of nasal cannula. GENERAL:  She is a medium-sized woman lying in the hospital stretcher. She  is quiet and looking around, but is alert and oriented and is able to answer questions and is fairly pleasant.  She is just very generally pale-looking though. HEENT:  Her sclerae are anicteric but her conjunctivitis are very noticeably pale.  Her pupils are equal, round, reactive to light and/or about 3 mm.  Her mouth is moist and normal-appearing.  There are no gross ulcerations noted in her mouth. NECK:  Supple.  There is no noted cervical lymphadenopathy.  Her carotid pulses are moderate. LUNGS:  Clear to auscultation completely on the left.  However, there is very prominent lack of breath sounds on the right going up to about her midlung field and there is dullness to percussion going up to about the same level. HEART:  Regular rate and rhythm with a few premature beats, but there are no gross murmurs or gallops.  She has a CABG scar overlying her sternum. ABDOMEN:  Soft, nontender, nondistended, and overall fairly benign although she does have minimal grimacing on palpation of her belly. EXTREMITIES:  Warm and noncyanotic, however  her fingers are quite pale as well and it is difficult to even establish capillary refill because they are so pale.  Nevertheless, they are not cool and her radial pulses are fairly easily palpable.  There is no bilateral lower extremity edema.  On her left anterior shin, there is an area of excoriation from having run into a bedpost a couple of days ago but it does not look infected and is scabbing over. SKIN:  Has no noted rashes other than that described above. NEUROLOGIC:  Grossly nonfocal.  She is alert, oriented, conversant, and moving all of her extremities spontaneously.  LABORATORY WORK:  Significant for that mentioned in the HPI with a leukocytosis of 12.2 with 80% neutrophils and no bands.  Hematocrit is down from baseline of 22.  She is not coagulopathic or thrombocytopenic. Chemistry panel was significant for a sodium 123 with a chloride of 89. Her renal function is at baseline at 19 and 1.24.  Her LFTs are normal including a T bili that was 0.4.  Her abdomen is a bit low at 2.6 and total protein is 5.3.  Lipase is normal at 7.  Her lactate was 1.3.  Her cardiac enzymes are negative times x1.  UA shows small leuks, negative nitrites, 11-20 WBCs, and 0-2 RBCs, and many bacteria.  She has been typed and crossmatched for 2 units.  She was negative for fecal occult blood.  She has 1 urine culture pending.  Radiography is per the HPI.  IMPRESSION:  This 77 year old female with a history of inferior myocardial infarction in 2004, status post four-vessel CABG, chronic abdominal pain and nausea, chronic bladder urgency and a complicated history of chronic UTIs, presents with 1-2 weeks of fatigue and right- sided chest pain and found to have acute anemia with a hematocrit of 22.3 with no apparent external blood loss. 1. Acute anemia.  She has had no apparent external blood loss.  She     does not appear to be hemolyzing with a T bili of 0.4.  She is not     coagulopathic,  thrombocytopenic, and was not taking any     antiplatelet agents or any anticoagulants.  The only potential     source of blood loss is what appears to be a large right-sided     pleural effusion on her chest x-ray.  I have asked the emergency     room to get a  CT of her chest without contrast and if this does     show the blood loss, she would likely need a Interventional     Pulmonology consultation for thoracentesis.  Symptomatically, I believe that she would benefit from blood transfusion and I will speak with the family about doing that soon.  She has 2 peripheral IVs and they were only able to get small-bore 22s but at present she is hemodynamically stable for least the past hour and is looking quite well.  She does have 2 units type and crossed.  1. Leukocyte leukocytosis.  There are no reported or recorded fevers     and I am sure I am unclear if she is truly infected at this point.     Her chest x-ray was called as cannot rule out pneumonia and I think     a CT chest will help delineate this.  Her UA does show signs of     infection but looking over her previous urine microbiology, she has     a long and complicated history of bacteriuria and infections, so     some of this may be colonization.  Regardless, she has been started     on ceftriaxone and azithromycin per the ED and I think this is     reasonable for now especially given that a previous discharge     summary noted that they treated her with ceftriaxone and that she     had improved in the past on that.  Also complicating the picture is     a quite formidable allergy list.  I would follow up the urine     culture and see how that pans out as well too.  1. Hypotension.  I wonder whether this is due to acute blood loss     versus a brewing infection.  As above, she has not had any fevers     though.  At this point, she has been minimally fluid resuscitated     and was very responsive to half a liter bolus.  This  should be     discontinued to be aggressively monitored and we will admit her to     the step-down unit.  She has been in the 120-130s for least an hour     though.  1. Hyponatremia and hypochloremia.  She does run in the high 120 to     low 130s at baseline.  However, her sodium at 123 is clearly below     her baseline.  This may be due to poor p.o. intake and at this     point I would see how she responds of normal saline and follow up     these values in the morning with a.m. labs.  It is encouraging     though that she is not in renal failure as her kidneys are at     baseline.  1. Fluid, electrolytes, and nutrition.  She is status post 500 mL     normal saline and we will run maintenance at this point.  I will     also talk to the family about transfusing some blood into her.  She     can have a normal heart healthy diet and we will continue her home     potassium supplements.  1. Intravenous access.  She has two 22-gauge peripheral IVs and she     should have 2 peripherals maintained at all points as well as  an     active type and screen at all times as well.  COMMUNICATION:  Her husband and her daughter are at bedside.  CODE STATUS:  As discussed with the patient, her daughter, and her husband, she is to be a full code.  The patient will be admitted to the step-down unit to [facility].          ______________________________ Carlota Raspberry, MD    EB/MEDQ  D:  12/25/2010  T:  12/25/2010  Job:  [id number]  Electronically Signed by Carlota Raspberry MD on 02/07/2011 12:05:27 PM

## 2011-03-12 ENCOUNTER — Encounter: Payer: Self-pay | Admitting: Cardiology

## 2011-03-12 ENCOUNTER — Ambulatory Visit: Payer: Medicare Other | Admitting: Cardiology

## 2011-03-12 ENCOUNTER — Ambulatory Visit (INDEPENDENT_AMBULATORY_CARE_PROVIDER_SITE_OTHER): Payer: Medicare Other | Admitting: Cardiology

## 2011-03-12 DIAGNOSIS — I2510 Atherosclerotic heart disease of native coronary artery without angina pectoris: Secondary | ICD-10-CM

## 2011-03-12 DIAGNOSIS — J90 Pleural effusion, not elsewhere classified: Secondary | ICD-10-CM

## 2011-03-12 DIAGNOSIS — J942 Hemothorax: Secondary | ICD-10-CM

## 2011-03-12 NOTE — Assessment & Plan Note (Signed)
Patient is now completely recovered from the hemothorax that she had in fact procedure.  She is stable.  Cardiac status is stable.  No change in therapy.

## 2011-03-12 NOTE — Progress Notes (Signed)
HPI Patient is seen today for cardiology followup.  I saw her last in April, 2012.  He does have a history of coronary disease.  There is a history of CABG in the past.  Catheterization in 2007 revealed patent grafts.  Most recently she was hospitalized after falling and developing a hemothorax.  She needed a vast procedure.  Ultimately she stabilized she has seen Dr. Donata Clay in followup and she is doing well.  Currently she is not having any significant chest pain or shortness of breath.  Information is obtained through an interpreter.  Her husband is also present. Allergies  Allergen Reactions  . Baclofen     REACTION: confusion  . Belladonna   . Ciprofloxacin   . Doxycycline   . Erythromycin   . Gabapentin   . Iohexol      Code: RASH   . Metronidazole     REACTION: rash  . Nalbuphine   . Nitrofurantoin     REACTION: nausea  . Penicillins   . Procaine Hcl   . Sucralfate     REACTION: nausea  . Sulfonamide Derivatives     Current Outpatient Prescriptions  Medication Sig Dispense Refill  . conjugated estrogens (PREMARIN) vaginal cream Place 1 g vaginally 2 (two) times a week.        . diphenhydrAMINE (BENADRYL) 25 mg capsule Take 1 capsule (25 mg total) by mouth every 6 (six) hours as needed for itching.  60 capsule  3  . divalproex (DEPAKOTE) 250 MG DR tablet Take 250 mg by mouth 2 (two) times daily.        . indapamide (LOZOL) 2.5 MG tablet Take 1/2 tablet daily as directed  15 tablet  3  . KLOR-CON M10 10 MEQ tablet TAKE 1 TABLET BY MOUTH 2 TIMES A DAY  60 tablet  5  . Lactulose 20 GM/30ML SOLN Take 30-60 mLs by mouth as needed.        Marland Kitchen LORazepam (ATIVAN) 1 MG tablet Take 1 tablet (1 mg total) by mouth every 8 (eight) hours as needed.  90 tablet  1  . methadone (DOLOPHINE) 10 MG tablet Take 1 tablet (10 mg total) by mouth 5 (five) times daily as needed. Fill on or after 02/25/11. Once every 30 days  150 tablet  0  . metoCLOPramide (REGLAN) 5 MG tablet TAKE 1 TABLET BY MOUTH  3 TIMES A DAY BEFORE MEALS  90 tablet  2  . metoprolol succinate (TOPROL-XL) 25 MG 24 hr tablet TAKE 1 TABLET BY MOUTH EVERY DAY  30 tablet  5  . nitroGLYCERIN (NITROSTAT) 0.4 MG SL tablet Place 1 tablet (0.4 mg total) under the tongue as needed.  20 tablet  6  . omeprazole (PRILOSEC) 20 MG capsule Take 20 mg by mouth 2 (two) times daily.        . polysaccharide iron (NIFEREX) 150 MG CAPS capsule Take 150 mg by mouth daily.        . promethazine (PHENERGAN) 25 MG tablet Take 1-2 tablets (25-50 mg total) by mouth 4 (four) times daily as needed. For nausea  60 tablet  5  . triamcinolone (KENALOG) 0.1 % ointment Apply topically 2 (two) times daily.        Marland Kitchen trimethoprim (TRIMPEX) 100 MG tablet Take 1 tablet (100 mg total) by mouth daily.  30 tablet  11  . zolpidem (AMBIEN) 10 MG tablet Take 1 tablet (10 mg total) by mouth at bedtime as needed.  30 tablet  5    History   Social History  . Marital Status: Married    Spouse Name: N/A    Number of Children: N/A  . Years of Education: N/A   Occupational History  . RETIRED MD    Social History Main Topics  . Smoking status: Never Smoker   . Smokeless tobacco: Not on file  . Alcohol Use: No  . Drug Use: No  . Sexually Active: No   Other Topics Concern  . Not on file   Social History Narrative   Patient is an immigrant from [place]. 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 Currently living w/her husband.     Family History  Problem Relation Age of Onset  . Coronary artery disease      Female 1st degree relative <60  . Colon cancer Mother   . Arthritis Mother   . Heart disease Mother   . Heart disease Father     Past Medical History  Diagnosis Date  . CAD (coronary artery disease)     Catheterization, January, 2007 patent grafts / hospitalization October 2 010 no MI  . Syncope 03/2009    ?orthostasis  . Allergic rhinitis   . History of colon polyps   . Diverticulosis of  colon   . GERD (gastroesophageal reflux disease)   . Hyperlipidemia   . Abdominal  pain, other specified site     motility disorder, chorinic functional, severe (Dr Juanda Chance). Possible OCD w/complusive use of enemas and laxatives; psychotic features 2011  . Positive H. pylori test 2911  . Psychosomatic disorder     w/GI fixation  . Anxiety   . Depression   . Stress incontinence, female   . Vitamin D deficiency   . Vitamin B12 deficiency   . Renal insufficiency   . UTI (urinary tract infection)   . Hypokalemia     mild  . Insomnia   . Breast cancer 2011    Dr Welton Flakes  . Carotid artery disease     Doppler November, 1610, 0-39% LICA, 60-79% R. ICA.Marland Kitchen increased velocity... possibly from serpentine vessel  . Ejection fraction     50-55%, no wall motion abnormalities, echo, November, 2010  . Edema   . Hemothorax on right     Past Surgical History  Procedure Date  . Coronary artery bypass graft   . Myomectomy     Uterine and ovarian  . Breast fibroadenoma surgery   . Hemicolectomy 2005    Left  . Breast lumpectomy 2011  . Right vats (video-assisted thoracoscopic surgery) with     ROS  Patient denies fever, chills, headache, sweats, rash, change in vision, change in hearing, nausea vomiting, urinary symptoms.  All other systems are reviewed and are negative. PHYSICAL EXAM Patient is stable.  She is here with the interpreter and her husband.  Head is atraumatic.  There is no jugular venous distention.  Lungs are clear.  Respiratory effort is nonlabored.  Cardiac exam reveals an S1-S2.  There are no clicks or significant murmurs.  The abdomen is soft.  There is no peripheral edema.    No skin rashes.  She does have kyphosis of the spine. Filed Vitals:   03/12/11 1534  BP: 114/78  Pulse: 80  Height: 5\' 5"  (1.651 m)  Weight: 134 lb (60.782 kg)      ASSESSMENT & PLAN

## 2011-03-12 NOTE — Assessment & Plan Note (Signed)
Coronary disease is stable. No change in therapy. 

## 2011-03-12 NOTE — Patient Instructions (Signed)
Your physician wants you to follow-up in:  6 months. You will receive a reminder letter in the mail two months in advance. If you don't receive a letter, please call our office to schedule the follow-up appointment.   

## 2011-03-19 ENCOUNTER — Other Ambulatory Visit: Payer: Self-pay | Admitting: Cardiothoracic Surgery

## 2011-03-19 DIAGNOSIS — S271XXA Traumatic hemothorax, initial encounter: Secondary | ICD-10-CM

## 2011-03-21 ENCOUNTER — Encounter: Payer: Self-pay | Admitting: Cardiothoracic Surgery

## 2011-03-21 ENCOUNTER — Ambulatory Visit (INDEPENDENT_AMBULATORY_CARE_PROVIDER_SITE_OTHER): Payer: Self-pay | Admitting: Cardiothoracic Surgery

## 2011-03-21 ENCOUNTER — Ambulatory Visit
Admission: RE | Admit: 2011-03-21 | Discharge: 2011-03-21 | Disposition: A | Discharge status: Home / Self Care | Payer: Medicare Other | Source: Ambulatory Visit | Attending: Cardiothoracic Surgery | Admitting: Cardiothoracic Surgery

## 2011-03-21 VITALS — BP 136/79 | HR 74 | Resp 18 | Ht 65.0 in | Wt 134.0 lb

## 2011-03-21 DIAGNOSIS — Z09 Encounter for follow-up examination after completed treatment for conditions other than malignant neoplasm: Secondary | ICD-10-CM

## 2011-03-21 DIAGNOSIS — J90 Pleural effusion, not elsewhere classified: Secondary | ICD-10-CM

## 2011-03-21 DIAGNOSIS — S271XXA Traumatic hemothorax, initial encounter: Secondary | ICD-10-CM

## 2011-03-21 NOTE — Progress Notes (Signed)
HPI:                    [address]            [name] [address]          [phone]      The patient returns for a final postop visit after undergoing right baths, evacuation of hematoma and decortication of the right lower lobe. The patient is 77 years old and had surgery this past summer after she developed a pneumothorax after a fall at home. She is now fairly well recovered. She is on chronic methadone for chronic pain syndrome by Dr. Posey Rea  her primary care physician. She denies any role postthoracotomy pain syndrome at this time. She is not short of breath. A chest x-ray today shows complete resolution of the right pleural effusion and postoperative changes at the right base.  Current Outpatient Prescriptions  Medication Sig Dispense Refill  . conjugated estrogens (PREMARIN) vaginal cream Place 1 g vaginally 2 (two) times a week.        . diphenhydrAMINE (BENADRYL) 25 mg capsule Take 1 capsule (25 mg total) by mouth every 6 (six) hours as needed for itching.  60 capsule  3  . divalproex (DEPAKOTE) 250 MG DR tablet Take 250 mg by mouth 2 (two) times daily.        Marland Kitchen KLOR-CON M10 10 MEQ tablet TAKE 1 TABLET BY MOUTH 2 TIMES A DAY  60 tablet  5  . Lactulose 20 GM/30ML SOLN Take 30-60 mLs by mouth as needed.        Marland Kitchen LORazepam (ATIVAN) 1 MG tablet Take 1 tablet (1 mg total) by mouth every 8 (eight) hours as needed.  90 tablet  1  . methadone (DOLOPHINE) 10 MG tablet Take 1 tablet (10 mg total) by mouth 5 (five) times daily as needed. Fill on or after 02/25/11. Once every 30 days  150 tablet  0  . metoCLOPramide (REGLAN) 5 MG tablet TAKE 1 TABLET BY MOUTH 3 TIMES A DAY BEFORE MEALS  90 tablet  2  . metoprolol succinate (TOPROL-XL) 25 MG 24 hr tablet TAKE 1 TABLET BY MOUTH EVERY DAY  30 tablet  5  . omeprazole (PRILOSEC) 20 MG capsule Take 20 mg by mouth 2 (two) times daily.        . promethazine (PHENERGAN) 25 MG tablet Take 1-2 tablets (25-50 mg total) by mouth 4 (four)  times daily as needed. For nausea  60 tablet  5  . triamcinolone (KENALOG) 0.1 % ointment Apply topically 2 (two) times daily.        Marland Kitchen trimethoprim (TRIMPEX) 100 MG tablet Take 1 tablet (100 mg total) by mouth daily.  30 tablet  11  . zolpidem (AMBIEN) 10 MG tablet Take 1 tablet (10 mg total) by mouth at bedtime as needed.  30 tablet  5     Physical Exam: Blood pressure 110/80 pulse 70 and regular saturation 96% on room air. She is alert and comfortable. Breath sounds are clear equal bilaterally. The thoracotomy incision well-healed. Cardiac rhythm is regular.  Diagnostic Tests: Chest x-ray today shows clear lung fields no active disease.  Impression: Stable course following right baths decortication and evacuation of hemothorax August 2012. She will return to the care of her primary care physician.  Plan: Return here as needed. No pain prescriptions were provided at this office visit.

## 2011-03-27 ENCOUNTER — Other Ambulatory Visit (INDEPENDENT_AMBULATORY_CARE_PROVIDER_SITE_OTHER): Payer: Medicare Other

## 2011-03-27 DIAGNOSIS — F329 Major depressive disorder, single episode, unspecified: Secondary | ICD-10-CM

## 2011-03-27 DIAGNOSIS — F411 Generalized anxiety disorder: Secondary | ICD-10-CM

## 2011-03-27 DIAGNOSIS — J90 Pleural effusion, not elsewhere classified: Secondary | ICD-10-CM

## 2011-03-27 DIAGNOSIS — D509 Iron deficiency anemia, unspecified: Secondary | ICD-10-CM

## 2011-03-27 DIAGNOSIS — J942 Hemothorax: Secondary | ICD-10-CM

## 2011-03-27 LAB — COMPREHENSIVE METABOLIC PANEL
Alkaline Phosphatase: 126 U/L — ABNORMAL HIGH (ref 39–117)
BUN: 23 mg/dL (ref 6–23)
CO2: 26 mEq/L (ref 19–32)
Creatinine, Ser: 1.2 mg/dL (ref 0.4–1.2)
GFR: 46.72 mL/min — ABNORMAL LOW (ref >60.00)
Glucose, Bld: 95 mg/dL (ref 70–99)
Sodium: 137 mEq/L (ref 135–145)
Total Bilirubin: 0.8 mg/dL (ref 0.3–1.2)

## 2011-03-27 LAB — CBC WITH DIFFERENTIAL/PLATELET
Basophils Relative: 0.6 % (ref 0.0–3.0)
Eosinophils Relative: 12.2 % — ABNORMAL HIGH (ref 0.0–5.0)
HCT: 32.8 % — ABNORMAL LOW (ref 36.0–46.0)
Hemoglobin: 11.1 g/dL — ABNORMAL LOW (ref 12.0–15.0)
Lymphs Abs: 1.5 10*3/uL (ref 0.7–4.0)
MCV: 94.4 fl (ref 78.0–100.0)
Monocytes Absolute: 0.7 10*3/uL (ref 0.1–1.0)
Monocytes Relative: 11.9 % (ref 3.0–12.0)
RBC: 3.48 Mil/uL — ABNORMAL LOW (ref 3.87–5.11)
WBC: 6.2 10*3/uL (ref 4.5–10.5)

## 2011-03-27 LAB — TSH: TSH: 4.12 u[IU]/mL (ref 0.35–5.50)

## 2011-03-27 LAB — IBC PANEL: Iron: 106 ug/dL (ref 42–145)

## 2011-04-10 ENCOUNTER — Ambulatory Visit (INDEPENDENT_AMBULATORY_CARE_PROVIDER_SITE_OTHER): Payer: Medicare Other | Admitting: Internal Medicine

## 2011-04-10 ENCOUNTER — Telehealth: Payer: Self-pay | Admitting: *Deleted

## 2011-04-10 ENCOUNTER — Encounter: Payer: Self-pay | Admitting: Internal Medicine

## 2011-04-10 DIAGNOSIS — J90 Pleural effusion, not elsewhere classified: Secondary | ICD-10-CM

## 2011-04-10 DIAGNOSIS — I2510 Atherosclerotic heart disease of native coronary artery without angina pectoris: Secondary | ICD-10-CM

## 2011-04-10 DIAGNOSIS — G894 Chronic pain syndrome: Secondary | ICD-10-CM

## 2011-04-10 DIAGNOSIS — E538 Deficiency of other specified B group vitamins: Secondary | ICD-10-CM

## 2011-04-10 DIAGNOSIS — J942 Hemothorax: Secondary | ICD-10-CM

## 2011-04-10 MED ORDER — DIVALPROEX SODIUM 250 MG PO DR TAB: 250.0000 mg | DELAYED_RELEASE_TABLET | Freq: Two times a day (BID) | ORAL | Status: DC

## 2011-04-10 MED ORDER — ESTROGENS, CONJUGATED 0.625 MG/GM VA CREA: 1.0000 g | TOPICAL_CREAM | VAGINAL | Status: AC

## 2011-04-10 MED ORDER — TRIAMCINOLONE ACETONIDE 0.1 % EX OINT: TOPICAL_OINTMENT | Freq: Two times a day (BID) | CUTANEOUS | Status: DC

## 2011-04-10 MED ORDER — ANASTROZOLE 1 MG PO TABS: 1.0000 mg | ORAL_TABLET | Freq: Every day | ORAL | Status: DC

## 2011-04-10 MED ORDER — OMEPRAZOLE 20 MG PO CPDR: 20.0000 mg | DELAYED_RELEASE_CAPSULE | Freq: Two times a day (BID) | ORAL | Status: DC

## 2011-04-10 MED ORDER — METHADONE HCL 10 MG PO TABS: 10.0000 mg | ORAL_TABLET | Freq: Every day | ORAL | Status: DC | PRN

## 2011-04-10 MED ORDER — ZOLPIDEM TARTRATE 10 MG PO TABS: 10.0000 mg | ORAL_TABLET | Freq: Every evening | ORAL | Status: DC | PRN

## 2011-04-10 MED ORDER — FERROUS SULFATE 325 (65 FE) MG PO TABS: 325.0000 mg | ORAL_TABLET | Freq: Every day | ORAL | Status: DC

## 2011-04-10 NOTE — Telephone Encounter (Signed)
called patient to inform patient of the new date and time on 07-23-2011 starting at 10:30am patient will need an russian interpreter called and left message on the interpreter's voicemail request to set patient up to have an russian interpreter come with

## 2011-04-10 NOTE — Progress Notes (Signed)
Subjective:    Patient ID: [name], female    DOB: May 11, 1927, 77 y.o.   MRN: [id number]  HPI  HPI  Patient is seen today for PC followup: she does have a history of coronary disease. There is a history of CABG in the past. Catheterization in 2007 revealed patent grafts. Most recently she was hospitalized after falling and developing a hemothorax. She needed a vast procedure. Ultimately she stabilized she has seen Dr. Donata Clay in followup and she is doing well. Currently she is not having any significant chest pain or shortness of breath. Information is obtained through an interpreter. Her husband is also present  The patient is here to follow up on chronic paranoid depression, anxiety,  and chronic moderate rectal pain symptoms controlled with medicines, diet and exercise.   Review of Systems  Constitutional: Positive for chills and fatigue. Negative for activity change, appetite change and unexpected weight change.  HENT: Negative for congestion, mouth sores and sinus pressure.   Eyes: Negative for visual disturbance.  Respiratory: Negative for cough and chest tightness.   Gastrointestinal: Positive for constipation. Negative for nausea, abdominal pain, diarrhea and blood in stool.  Genitourinary: Negative for frequency, difficulty urinating and vaginal pain.  Musculoskeletal: Negative for back pain and gait problem.  Skin: Negative for pallor and rash.  Neurological: Negative for dizziness, tremors, weakness, numbness and headaches.  Psychiatric/Behavioral: Positive for behavioral problems and dysphoric mood. Negative for suicidal ideas, hallucinations, confusion and sleep disturbance. The patient is nervous/anxious.        Objective:   Physical Exam  Constitutional: She appears well-developed. No distress.       Chron ill appearing  HENT:  Head: Normocephalic.  Right Ear: External ear normal.  Left Ear: External ear normal.  Nose: Nose normal.  Mouth/Throat:  Oropharynx is clear and moist.  Eyes: Conjunctivae are normal. Pupils are equal, round, and reactive to light. Right eye exhibits no discharge. Left eye exhibits no discharge.  Neck: Normal range of motion. Neck supple. No JVD present. No tracheal deviation present. No thyromegaly present.  Cardiovascular: Normal rate, regular rhythm and normal heart sounds.   Pulmonary/Chest: No stridor. No respiratory distress. She has no wheezes.  Abdominal: Soft. Bowel sounds are normal. She exhibits no distension and no mass. There is tenderness (mild). There is no rebound and no guarding.  Musculoskeletal: She exhibits no edema and no tenderness.  Lymphadenopathy:    She has no cervical adenopathy.  Neurological: She displays normal reflexes. No cranial nerve deficit. She exhibits normal muscle tone. Coordination normal.  Skin: No rash noted. No erythema.  Psychiatric: Her behavior is normal. Thought content normal.   Lab Results  Component Value Date   WBC 6.2 03/27/2011   HGB 11.1* 03/27/2011   HCT 32.8* 03/27/2011   PLT 209.0 03/27/2011   GLUCOSE 95 03/27/2011   CHOL 302* 10/07/2009   TRIG 274.0* 10/07/2009   HDL 51.50 10/07/2009   LDLDIRECT 195.8 10/07/2009   LDLCALC  Value: 280        Total Cholesterol/HDL:CHD Risk Coronary Heart Disease Risk Table                     Men   Women  1/2 Average Risk   3.4   3.3  Average Risk       5.0   4.4  2 X Average Risk   9.6   7.1  3 X Average Risk  23.4   11.0  Use the calculated Patient Ratio above and the CHD Risk Table to determine the patient's CHD Risk.        ATP III CLASSIFICATION (LDL):  <100     mg/dL   Optimal  562-130  mg/dL   Near or Above                    Optimal  130-159  mg/dL   Borderline  865-784  mg/dL   High  >696     mg/dL   Very High* 29/09/2839   ALT 19 03/27/2011   AST 21 03/27/2011   NA 137 03/27/2011   K 4.1 03/27/2011   CL 103 03/27/2011   CREATININE 1.2 03/27/2011   BUN 23 03/27/2011   CO2 26 03/27/2011   TSH 4.12 03/27/2011     INR 1.02 12/25/2010          Assessment & Plan:

## 2011-04-11 ENCOUNTER — Encounter: Payer: Self-pay | Admitting: Internal Medicine

## 2011-04-11 NOTE — Assessment & Plan Note (Signed)
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 addiction risk, apnea etc) and complications (i.e. Somnolence, constipation and others) were explained to the patient and were aknowledged. Discussed

## 2011-04-11 NOTE — Assessment & Plan Note (Signed)
S/P   Right vats (video-assisted thoracoscopic surgery) with  minithoracotomy. Evacuation of right pleural space hematoma and decortication of  right lower lobe. 12/26/2010 Dr Donata Clay Much improved

## 2011-04-11 NOTE — Assessment & Plan Note (Signed)
Continue with current prescription therapy as reflected on the Med list.  

## 2011-05-08 ENCOUNTER — Other Ambulatory Visit: Payer: Self-pay | Admitting: Internal Medicine

## 2011-05-09 ENCOUNTER — Other Ambulatory Visit: Payer: Self-pay | Admitting: *Deleted

## 2011-05-09 DIAGNOSIS — I6529 Occlusion and stenosis of unspecified carotid artery: Secondary | ICD-10-CM

## 2011-05-11 ENCOUNTER — Encounter (INDEPENDENT_AMBULATORY_CARE_PROVIDER_SITE_OTHER): Payer: Medicare Other | Admitting: *Deleted

## 2011-05-11 DIAGNOSIS — I6529 Occlusion and stenosis of unspecified carotid artery: Secondary | ICD-10-CM

## 2011-05-28 ENCOUNTER — Telehealth: Payer: Self-pay | Admitting: *Deleted

## 2011-05-28 NOTE — Telephone Encounter (Signed)
Rf req for lorazepam 1 mg 1 po q 8 hours. Ok to Rf in AVP's absence?

## 2011-05-29 MED ORDER — LORAZEPAM 1 MG PO TABS: 1.0000 mg | ORAL_TABLET | Freq: Three times a day (TID) | ORAL | Status: DC | PRN

## 2011-05-29 NOTE — Telephone Encounter (Signed)
yes

## 2011-06-12 ENCOUNTER — Encounter: Payer: Self-pay | Admitting: Internal Medicine

## 2011-06-12 ENCOUNTER — Ambulatory Visit (INDEPENDENT_AMBULATORY_CARE_PROVIDER_SITE_OTHER): Payer: Medicare Other | Admitting: Internal Medicine

## 2011-06-12 DIAGNOSIS — I959 Hypotension, unspecified: Secondary | ICD-10-CM

## 2011-06-12 DIAGNOSIS — G894 Chronic pain syndrome: Secondary | ICD-10-CM

## 2011-06-12 DIAGNOSIS — F1920 Other psychoactive substance dependence, uncomplicated: Secondary | ICD-10-CM

## 2011-06-12 DIAGNOSIS — N39498 Other specified urinary incontinence: Secondary | ICD-10-CM

## 2011-06-12 DIAGNOSIS — E538 Deficiency of other specified B group vitamins: Secondary | ICD-10-CM

## 2011-06-12 DIAGNOSIS — R55 Syncope and collapse: Secondary | ICD-10-CM

## 2011-06-12 MED ORDER — METHADONE HCL 10 MG PO TABS: 10.0000 mg | ORAL_TABLET | Freq: Every day | ORAL | Status: DC | PRN

## 2011-06-12 MED ORDER — AMYLASE-LIPASE-PROTEASE 66.4-20-75 MU PO CPEP: 1.0000 | ORAL_CAPSULE | Freq: Three times a day (TID) | ORAL | Status: DC

## 2011-06-12 NOTE — Assessment & Plan Note (Signed)
Continue with current prescription therapy as reflected on the Med list.  

## 2011-06-12 NOTE — Progress Notes (Signed)
Patient ID: [name], female   DOB: 31-Jul-1927, 78 y.o.   MRN: [id number]  Subjective:    Patient ID: [name], female    DOB: 02-08-1928, 78 y.o.   MRN: [id number]  Dysuria  Associated symptoms include chills and frequency. Pertinent negatives include no nausea.  Urinary Frequency  Associated symptoms include chills and frequency. Pertinent negatives include no nausea.    HPI   The patient is here to follow up on chronic paranoid depression, anxiety,  and chronic moderate rectal pain symptoms controlled with medicines, diet and exercise.   Review of Systems  Constitutional: Positive for chills and fatigue. Negative for activity change, appetite change and unexpected weight change.  HENT: Negative for congestion, mouth sores and sinus pressure.   Eyes: Negative for visual disturbance.  Respiratory: Negative for cough and chest tightness.   Gastrointestinal: Positive for constipation. Negative for nausea, abdominal pain, diarrhea and blood in stool.  Genitourinary: Positive for dysuria and frequency. Negative for difficulty urinating and vaginal pain.  Musculoskeletal: Negative for back pain and gait problem.  Skin: Negative for pallor and rash.  Neurological: Negative for dizziness, tremors, weakness, numbness and headaches.  Psychiatric/Behavioral: Positive for behavioral problems and dysphoric mood. Negative for suicidal ideas, hallucinations, confusion and sleep disturbance. The patient is nervous/anxious.        Objective:   Physical Exam  Constitutional: She appears well-developed. No distress.       Chron ill appearing  HENT:  Head: Normocephalic.  Right Ear: External ear normal.  Left Ear: External ear normal.  Nose: Nose normal.  Mouth/Throat: Oropharynx is clear and moist.  Eyes: Conjunctivae are normal. Pupils are equal, round, and reactive to light. Right eye exhibits no discharge. Left eye exhibits no discharge.  Neck: Normal range of  motion. Neck supple. No JVD present. No tracheal deviation present. No thyromegaly present.  Cardiovascular: Normal rate, regular rhythm and normal heart sounds.   Pulmonary/Chest: No stridor. No respiratory distress. She has no wheezes.  Abdominal: Soft. Bowel sounds are normal. She exhibits no distension and no mass. There is tenderness (mild). There is no rebound and no guarding.  Musculoskeletal: She exhibits no edema and no tenderness.  Lymphadenopathy:    She has no cervical adenopathy.  Neurological: She displays normal reflexes. No cranial nerve deficit. She exhibits normal muscle tone. Coordination normal.  Skin: No rash noted. No erythema.  Psychiatric: Her behavior is normal. Thought content normal.           Assessment & Plan:

## 2011-06-15 ENCOUNTER — Telehealth: Payer: Self-pay | Admitting: *Deleted

## 2011-06-15 MED ORDER — PANCRELIPASE (LIP-PROT-AMYL) 24000-76000 UNITS PO CPEP: 1.0000 | ORAL_CAPSULE | Freq: Three times a day (TID) | ORAL | Status: DC

## 2011-06-15 NOTE — Telephone Encounter (Signed)
Pangestyme is not available. Only avail is Creon 12, Creaon 24 or Pancreage 21. Please advise are any of these acceptable?

## 2011-06-15 NOTE — Telephone Encounter (Signed)
Creon 24 Done

## 2011-06-21 ENCOUNTER — Other Ambulatory Visit: Payer: Self-pay | Admitting: Internal Medicine

## 2011-07-03 ENCOUNTER — Other Ambulatory Visit: Payer: Self-pay | Admitting: Internal Medicine

## 2011-07-10 ENCOUNTER — Telehealth: Payer: Self-pay | Admitting: Oncology

## 2011-07-10 NOTE — Telephone Encounter (Signed)
S/w the pt's husband and he is aware of the r/s appt time on 07/23/2011 from 10:30am to 3:00pm. S/w luwanna in the clinical socail work dept to make her aware of the appt time change on 07/23/2011

## 2011-07-20 ENCOUNTER — Telehealth: Payer: Self-pay | Admitting: *Deleted

## 2011-07-20 NOTE — Telephone Encounter (Signed)
Rf req for Lorazepam 1 mg 1 po q 8 hr prn. Ok to Rf?

## 2011-07-22 NOTE — Telephone Encounter (Signed)
OK to fill this prescription with additional refills x5 Thank you!  

## 2011-07-23 ENCOUNTER — Other Ambulatory Visit (HOSPITAL_BASED_OUTPATIENT_CLINIC_OR_DEPARTMENT_OTHER): Payer: Medicare Other | Admitting: Lab

## 2011-07-23 ENCOUNTER — Encounter: Payer: Self-pay | Admitting: Oncology

## 2011-07-23 ENCOUNTER — Telehealth: Payer: Self-pay | Admitting: *Deleted

## 2011-07-23 ENCOUNTER — Ambulatory Visit (HOSPITAL_BASED_OUTPATIENT_CLINIC_OR_DEPARTMENT_OTHER): Payer: Medicare Other | Admitting: Oncology

## 2011-07-23 VITALS — BP 161/82 | HR 72 | Temp 98.4°F | Ht 65.0 in | Wt 134.4 lb

## 2011-07-23 DIAGNOSIS — Z170 Estrogen receptor positive status [ER+]: Secondary | ICD-10-CM

## 2011-07-23 DIAGNOSIS — Z853 Personal history of malignant neoplasm of breast: Secondary | ICD-10-CM

## 2011-07-23 DIAGNOSIS — C50219 Malignant neoplasm of upper-inner quadrant of unspecified female breast: Secondary | ICD-10-CM

## 2011-07-23 DIAGNOSIS — R52 Pain, unspecified: Secondary | ICD-10-CM

## 2011-07-23 LAB — CBC WITH DIFFERENTIAL/PLATELET
BASO%: 0.5 % (ref 0.0–2.0)
Eosinophils Absolute: 0.7 10*3/uL — ABNORMAL HIGH (ref 0.0–0.5)
HCT: 34.8 % (ref 34.8–46.6)
LYMPH%: 24.1 % (ref 14.0–49.7)
MCHC: 33.5 g/dL (ref 31.5–36.0)
MONO#: 0.6 10*3/uL (ref 0.1–0.9)
NEUT#: 3.5 10*3/uL (ref 1.5–6.5)
NEUT%: 54.7 % (ref 38.4–76.8)
Platelets: 216 10*3/uL (ref 145–400)
RBC: 3.74 10*6/uL (ref 3.70–5.45)
WBC: 6.3 10*3/uL (ref 3.9–10.3)
lymph#: 1.5 10*3/uL (ref 0.9–3.3)

## 2011-07-23 LAB — COMPREHENSIVE METABOLIC PANEL
ALT: 14 U/L (ref 0–35)
CO2: 28 mEq/L (ref 19–32)
Calcium: 9.1 mg/dL (ref 8.4–10.5)
Chloride: 96 mEq/L (ref 96–112)
Glucose, Bld: 121 mg/dL — ABNORMAL HIGH (ref 70–99)
Sodium: 131 mEq/L — ABNORMAL LOW (ref 135–145)
Total Protein: 6.6 g/dL (ref 6.0–8.3)

## 2011-07-23 MED ORDER — LORAZEPAM 1 MG PO TABS: 1.0000 mg | ORAL_TABLET | Freq: Three times a day (TID) | ORAL | Status: DC | PRN

## 2011-07-23 NOTE — Progress Notes (Signed)
OFFICE PROGRESS NOTE  CC  Sandy Primes, MD, MD [address] [facility] [address] [place] [address] Dr. 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  #2 patient was then begun on Arimidex 1 mg daily starting in January 2012.  CURRENT THERAPY:Arimidex 1 mg daily  INTERVAL HISTORY: [name] 78 y.o. female returns for followup visit at 6 months. Clinically she is doing well she has been very compliant with her therapy. She is taking it on a very consistent and daily basis. She is complaining of some breast pain on the left side as well as in the left axilla. My examination does not reveal any evidence of any masses or lymphadenopathy. She does tell me that she fell a few months ago and she is still having some rib pain on that side. She otherwise denies any fevers chills night sweats headaches shortness of breath chest pains palpitations she has no nausea or vomiting no peripheral paresthesias no bleeding no vaginal discharge. Remainder of the 10 point review of systems is negative.  MEDICAL HISTORY: Past Medical History  Diagnosis Date  . CAD (coronary artery disease)     Catheterization, January, 2007 patent grafts / hospitalization October 2 010 no MI  . Syncope 03/2009    ?orthostasis  . Allergic rhinitis   . History of colon polyps   . Diverticulosis of colon   . GERD (gastroesophageal reflux disease)   . Hyperlipidemia   . Abdominal  pain, other specified site     motility disorder, chorinic functional, severe (Dr Juanda Chance). Possible OCD w/complusive use of enemas and laxatives; psychotic features 2011  . Positive H. pylori test 2911  . Psychosomatic disorder     w/GI fixation  . Anxiety   . Depression   .  Stress incontinence, female   . Vitamin d deficiency   . Vitamin B12 deficiency   . Renal insufficiency   . UTI (urinary tract infection)   . Hypokalemia     mild  . Insomnia   . Breast cancer 2011    Dr Welton Flakes  . Carotid artery disease     Doppler November, 6045, 0-39% LICA, 60-79% R. ICA.Marland Kitchen increased velocity... possibly from serpentine vessel  . Ejection fraction     50-55%, no wall motion abnormalities, echo, November, 2010  . Edema   . Hemothorax on right     ALLERGIES:  is allergic to baclofen; belladonna; ciprofloxacin; doxycycline; erythromycin; gabapentin; iohexol; metronidazole; nalbuphine; nitrofurantoin; penicillins; procaine hcl; sucralfate; and sulfonamide derivatives.  MEDICATIONS:  Current Outpatient Prescriptions  Medication Sig Dispense Refill  . amylase-lipase-protease (PANGESTYME CN-20) 66.08-24-73 MU per capsule Take 1 capsule by mouth 3 (three) times daily with meals.      . conjugated estrogens (PREMARIN) vaginal cream Place 0.5 Applicatorfuls vaginally 2 (two) times a week.  42.5 g  5  . diphenhydrAMINE (BENADRYL) 25 mg capsule Take 1 capsule (25 mg total) by mouth every 6 (six) hours as needed for itching.  60 capsule  3  . divalproex (DEPAKOTE) 250 MG DR tablet Take 1 tablet (250 mg total) by mouth 2 (two) times daily.  60 tablet  11  . ferrous sulfate 325 (65 FE) MG tablet Take 1 tablet (325 mg total) by mouth daily.  30 tablet  3  . KLOR-CON M10 10 MEQ tablet TAKE 1 TABLET BY MOUTH 2 TIMES A DAY  60 tablet  5  . Lactulose 20 GM/30ML SOLN Take 30-60 mLs by mouth as needed.        Marland Kitchen LORazepam (ATIVAN) 1 MG tablet Take 1 tablet (1 mg total) by mouth every 8 (eight) hours as needed.  90 tablet  5  . methadone (DOLOPHINE) 10 MG tablet Take 1 tablet (10 mg total) by mouth 5 (five) times daily as needed. Fill on or after 07/26/11. Once every 30 days  150 tablet  0  . metoCLOPramide (REGLAN) 5 MG tablet TAKE 1 TABLET BY MOUTH 3 TIMES A DAY BEFORE MEALS  90 tablet  0  .  metoprolol succinate (TOPROL-XL) 25 MG 24 hr tablet TAKE 1 TABLET BY MOUTH EVERY DAY  30 tablet  5  . omeprazole (PRILOSEC) 20 MG capsule Take 1 capsule (20 mg total) by mouth 2 (two) times daily.  60 capsule  11  . Pancrelipase, Lip-Prot-Amyl, (CREON) 24000 UNITS CPEP Take 1 capsule (24,000 Units total) by mouth 3 (three) times daily with meals.  90 capsule  5  . promethazine (PHENERGAN) 25 MG tablet TAKE 1 TO 2 TABLETS BY MOUTH 4 TIMES A DAY AS NEEDED FOR NAUSEA  60 tablet  5  . triamcinolone ointment (KENALOG) 0.1 % Apply topically 2 (two) times daily.  90 g  3  . trimethoprim (TRIMPEX) 100 MG tablet TAKE 1 TABLET BY MOUTH EVERY DAY  30 tablet  5  . zolpidem (AMBIEN) 10 MG tablet Take 1 tablet (10 mg total) by mouth at bedtime as needed.  30 tablet  5  . anastrozole (ARIMIDEX) 1 MG tablet Take 1 mg by mouth daily.      Marland Kitchen DISCONTD: LORazepam (ATIVAN) 1 MG tablet Take 1 tablet (1 mg total) by mouth every 8 (eight) hours as needed.  90 tablet  1    SURGICAL HISTORY:  Past Surgical History  Procedure Date  . Coronary artery bypass graft   . Myomectomy     Uterine and ovarian  . Breast fibroadenoma surgery   . Hemicolectomy 2005    Left  . Breast lumpectomy 2011  . Right vats (video-assisted thoracoscopic surgery) with     REVIEW OF SYSTEMS:  Pertinent items are noted in HPI.   PHYSICAL EXAMINATION: General appearance: alert, cooperative and appears stated age Lymph nodes: Cervical, supraclavicular, and axillary nodes normal. Resp: clear to auscultation bilaterally and normal percussion bilaterally Back: symmetric, no curvature. ROM normal. No CVA tenderness. Cardio: regular rate and rhythm, S1, S2 normal, no murmur, click, rub or gallop GI: soft, non-tender; bowel sounds normal; no masses,  no organomegaly Extremities: extremities normal, atraumatic, no cyanosis or edema Neurologic: Alert and oriented X 3, normal strength and tone. Normal symmetric reflexes. Normal coordination and  gait Bilateral breast examination: Bilateral breast exam is performed there are no masses nipple discharge no axillary lymphadenopathy is noted in either of the breasts. ECOG PERFORMANCE STATUS: 1 - Symptomatic but completely ambulatory  Blood pressure 161/82, pulse 72, temperature 98.4 F (36.9 C), temperature source Oral, height 5\' 5"  (1.651 m), weight 134 lb 6.4 oz (60.963 kg).  LABORATORY DATA: Lab Results  Component Value Date   WBC 6.3 07/23/2011   HGB 11.7 07/23/2011   HCT 34.8 07/23/2011   MCV 93.1 07/23/2011   PLT 216 07/23/2011      Chemistry      Component Value Date/Time  NA 131* 07/23/2011 1505   K 3.9 07/23/2011 1505   CL 96 07/23/2011 1505   CO2 28 07/23/2011 1505   BUN 19 07/23/2011 1505   CREATININE 1.08 07/23/2011 1505      Component Value Date/Time   CALCIUM 9.1 07/23/2011 1505   ALKPHOS 113 07/23/2011 1505   AST 20 07/23/2011 1505   ALT 14 07/23/2011 1505   BILITOT 0.5 07/23/2011 1505       RADIOGRAPHIC STUDIES:  No results found.  ASSESSMENT: 78 year old female with  #1 stage I invasive ductal carcinoma of the left breast status post lumpectomy in December 2011. This was then followed by Arimidex 1 mg daily starting in January 2012. Overall she is doing well she has no evidence of recurrent disease.  #2 left-sided pain most likely due to her falls I am unable to find any palpable masses or any tenderness on palpation.   PLAN:   #1 patient will continue Arimidex on a daily basis overall she is doing well with it.  #2 musculoskeletal pain I have offered to do a bone scan to rule out any metastatic disease. However at this time the patient declines. Which I do agree with as I my index of suspicion is very low that she is suffering from any kind of metastatic bony disease do to her breast cancer.  #3 patient will be seen back in 6 months time. However she knows to call me sooner if she needs to be some seen sooner or if any other issues arise. Patient was seen  by a company min of a translator today.   All questions were answered. The patient knows to call the clinic with any problems, questions or concerns. We can certainly see the patient much sooner if necessary.  I spent 25 minutes counseling the patient face to face. The total time spent in the appointment was 30 minutes.    Drue Second, MD Medical/Oncology [facility] [phone] (beeper) [phone] (Office)  07/23/2011, 5:00 PM

## 2011-07-23 NOTE — Telephone Encounter (Signed)
gave patient appointment for 03-03-2012 starting at 9:00am printed out calendar and gave to the patient

## 2011-07-23 NOTE — Patient Instructions (Signed)
1. Continue Arimidex.  2. Follow up in 6 months.  3. Call with any problems

## 2011-07-23 NOTE — Telephone Encounter (Signed)
Done

## 2011-08-10 ENCOUNTER — Other Ambulatory Visit: Payer: Self-pay | Admitting: Internal Medicine

## 2011-08-14 ENCOUNTER — Encounter (HOSPITAL_COMMUNITY): Payer: Self-pay | Admitting: Emergency Medicine

## 2011-08-14 ENCOUNTER — Other Ambulatory Visit (INDEPENDENT_AMBULATORY_CARE_PROVIDER_SITE_OTHER): Payer: Medicare Other

## 2011-08-14 ENCOUNTER — Ambulatory Visit (INDEPENDENT_AMBULATORY_CARE_PROVIDER_SITE_OTHER): Payer: Medicare Other | Admitting: Internal Medicine

## 2011-08-14 ENCOUNTER — Encounter: Payer: Self-pay | Admitting: Internal Medicine

## 2011-08-14 ENCOUNTER — Emergency Department (HOSPITAL_COMMUNITY)
Admission: EM | Admit: 2011-08-14 | Discharge: 2011-08-14 | Disposition: A | Discharge status: Home / Self Care | Payer: Medicare Other | Attending: Emergency Medicine | Admitting: Emergency Medicine

## 2011-08-14 VITALS — BP 130/90 | HR 88 | Temp 98.3°F | Resp 16 | Wt 131.0 lb

## 2011-08-14 DIAGNOSIS — F3289 Other specified depressive episodes: Secondary | ICD-10-CM

## 2011-08-14 DIAGNOSIS — F329 Major depressive disorder, single episode, unspecified: Secondary | ICD-10-CM

## 2011-08-14 DIAGNOSIS — F429 Obsessive-compulsive disorder, unspecified: Secondary | ICD-10-CM

## 2011-08-14 DIAGNOSIS — F1920 Other psychoactive substance dependence, uncomplicated: Secondary | ICD-10-CM

## 2011-08-14 DIAGNOSIS — R259 Unspecified abnormal involuntary movements: Secondary | ICD-10-CM

## 2011-08-14 DIAGNOSIS — IMO0002 Reserved for concepts with insufficient information to code with codable children: Secondary | ICD-10-CM | POA: Insufficient documentation

## 2011-08-14 DIAGNOSIS — R252 Cramp and spasm: Secondary | ICD-10-CM

## 2011-08-14 DIAGNOSIS — F29 Unspecified psychosis not due to a substance or known physiological condition: Secondary | ICD-10-CM | POA: Insufficient documentation

## 2011-08-14 DIAGNOSIS — N3090 Cystitis, unspecified without hematuria: Secondary | ICD-10-CM

## 2011-08-14 DIAGNOSIS — Z79899 Other long term (current) drug therapy: Secondary | ICD-10-CM | POA: Insufficient documentation

## 2011-08-14 DIAGNOSIS — E538 Deficiency of other specified B group vitamins: Secondary | ICD-10-CM | POA: Insufficient documentation

## 2011-08-14 DIAGNOSIS — F341 Dysthymic disorder: Secondary | ICD-10-CM | POA: Insufficient documentation

## 2011-08-14 DIAGNOSIS — N390 Urinary tract infection, site not specified: Secondary | ICD-10-CM | POA: Insufficient documentation

## 2011-08-14 DIAGNOSIS — F411 Generalized anxiety disorder: Secondary | ICD-10-CM

## 2011-08-14 DIAGNOSIS — Z9889 Other specified postprocedural states: Secondary | ICD-10-CM | POA: Insufficient documentation

## 2011-08-14 DIAGNOSIS — I2510 Atherosclerotic heart disease of native coronary artery without angina pectoris: Secondary | ICD-10-CM | POA: Insufficient documentation

## 2011-08-14 DIAGNOSIS — R10819 Abdominal tenderness, unspecified site: Secondary | ICD-10-CM | POA: Insufficient documentation

## 2011-08-14 DIAGNOSIS — G894 Chronic pain syndrome: Secondary | ICD-10-CM

## 2011-08-14 DIAGNOSIS — K219 Gastro-esophageal reflux disease without esophagitis: Secondary | ICD-10-CM | POA: Insufficient documentation

## 2011-08-14 DIAGNOSIS — F32A Depression, unspecified: Secondary | ICD-10-CM

## 2011-08-14 DIAGNOSIS — E785 Hyperlipidemia, unspecified: Secondary | ICD-10-CM | POA: Insufficient documentation

## 2011-08-14 DIAGNOSIS — Z853 Personal history of malignant neoplasm of breast: Secondary | ICD-10-CM | POA: Insufficient documentation

## 2011-08-14 DIAGNOSIS — R609 Edema, unspecified: Secondary | ICD-10-CM | POA: Insufficient documentation

## 2011-08-14 DIAGNOSIS — R109 Unspecified abdominal pain: Secondary | ICD-10-CM | POA: Insufficient documentation

## 2011-08-14 DIAGNOSIS — F0390 Unspecified dementia without behavioral disturbance: Secondary | ICD-10-CM | POA: Insufficient documentation

## 2011-08-14 LAB — URINALYSIS, ROUTINE W REFLEX MICROSCOPIC
Ketones, ur: NEGATIVE mg/dL
Nitrite: POSITIVE — AB
Protein, ur: NEGATIVE mg/dL
Urobilinogen, UA: 0.2 mg/dL (ref 0.0–1.0)

## 2011-08-14 LAB — URINE MICROSCOPIC-ADD ON

## 2011-08-14 LAB — CBC WITH DIFFERENTIAL/PLATELET
Basophils Absolute: 0 K/uL (ref 0.0–0.1)
Basophils Relative: 0.4 % (ref 0.0–3.0)
Eosinophils Absolute: 0.4 K/uL (ref 0.0–0.7)
Eosinophils Relative: 6.4 % — ABNORMAL HIGH (ref 0.0–5.0)
HCT: 33.8 % — ABNORMAL LOW (ref 36.0–46.0)
Hemoglobin: 11.2 g/dL — ABNORMAL LOW (ref 12.0–15.0)
Lymphocytes Relative: 23.1 % (ref 12.0–46.0)
Lymphs Abs: 1.5 K/uL (ref 0.7–4.0)
MCHC: 33.2 g/dL (ref 30.0–36.0)
MCV: 95.6 fl (ref 78.0–100.0)
Monocytes Absolute: 0.7 K/uL (ref 0.1–1.0)
Monocytes Relative: 10.9 % (ref 3.0–12.0)
Neutro Abs: 3.8 K/uL (ref 1.4–7.7)
Neutrophils Relative %: 59.2 % (ref 43.0–77.0)
Platelets: 233 K/uL (ref 150.0–400.0)
RBC: 3.53 Mil/uL — ABNORMAL LOW (ref 3.87–5.11)
RDW: 14.3 % (ref 11.5–14.6)
WBC: 6.4 K/uL (ref 4.5–10.5)

## 2011-08-14 LAB — BASIC METABOLIC PANEL
CO2: 29 mEq/L (ref 19–32)
Calcium: 9.1 mg/dL (ref 8.4–10.5)
Chloride: 92 mEq/L — ABNORMAL LOW (ref 96–112)
Glucose, Bld: 87 mg/dL (ref 70–99)
Potassium: 4.2 mEq/L (ref 3.5–5.1)
Sodium: 129 mEq/L — ABNORMAL LOW (ref 135–145)

## 2011-08-14 LAB — HEPATIC FUNCTION PANEL
ALT: 18 U/L (ref 0–35)
AST: 20 U/L (ref 0–37)
Albumin: 4 g/dL (ref 3.5–5.2)
Alkaline Phosphatase: 99 U/L (ref 39–117)
Bilirubin, Direct: 0.1 mg/dL (ref 0.0–0.3)
Total Bilirubin: 0.7 mg/dL (ref 0.3–1.2)
Total Protein: 6.5 g/dL (ref 6.0–8.3)

## 2011-08-14 LAB — TSH: TSH: 1.82 u[IU]/mL (ref 0.35–5.50)

## 2011-08-14 MED ORDER — GENTAMICIN SULFATE 40 MG/ML IJ SOLN
90.0000 mg | Freq: Once | INTRAMUSCULAR | Status: AC
  Administered 2011-08-14: 90 mg via INTRAMUSCULAR
  Filled 2011-08-14: qty 4

## 2011-08-14 MED ORDER — CYANOCOBALAMIN 1000 MCG/ML IJ SOLN
1000.0000 ug | Freq: Once | INTRAMUSCULAR | Status: AC
  Administered 2011-08-14: 1000 ug via INTRAMUSCULAR

## 2011-08-14 MED ORDER — PHENAZOPYRIDINE HCL 200 MG PO TABS
200.0000 mg | ORAL_TABLET | Freq: Three times a day (TID) | ORAL | Status: DC
  Administered 2011-08-14: 200 mg via ORAL
  Filled 2011-08-14: qty 1

## 2011-08-14 MED ORDER — METHADONE HCL 10 MG PO TABS: 10.0000 mg | ORAL_TABLET | Freq: Every day | ORAL | Status: DC | PRN

## 2011-08-14 MED ORDER — LORAZEPAM 1 MG PO TABS
ORAL_TABLET | ORAL | Status: AC
  Filled 2011-08-14: qty 2

## 2011-08-14 MED ORDER — LORAZEPAM 1 MG PO TABS
2.0000 mg | ORAL_TABLET | Freq: Once | ORAL | Status: AC
  Administered 2011-08-14: 2 mg via ORAL

## 2011-08-14 MED ORDER — GENTAMICIN SULFATE 40 MG/ML IJ SOLN: 2.5000 mg/kg | Freq: Once | INTRAVENOUS | Status: DC

## 2011-08-14 MED ORDER — DIAZEPAM 5 MG PO TABS: 5.0000 mg | ORAL_TABLET | Freq: Four times a day (QID) | ORAL | Status: DC | PRN

## 2011-08-14 NOTE — Assessment & Plan Note (Signed)
Obsessive use of enemas - s/p multiple unsuccessful attempts to treat. She has declined psychiatric help multiple times. Poor prognosis: enema overuse leads to abd pain and a chronic use of methadone  Continue with current prescription therapy as reflected on the Med list.

## 2011-08-14 NOTE — Assessment & Plan Note (Signed)
Refractory, chronic and resistant She was in ER this am - IV Genta

## 2011-08-14 NOTE — Assessment & Plan Note (Signed)
Continue with current prescription therapy as reflected on the Med list.  

## 2011-08-14 NOTE — Progress Notes (Signed)
Patient ID: [name], female   DOB: May 17, 1927, 78 y.o.   MRN: [id number] Patient ID: [name], female   DOB: [birth year], 78 y.o.   MRN: [id number]  Subjective:    Patient ID: [name], female    DOB: [birth year], 78 y.o.   MRN: [id number]  Dysuria  Associated symptoms include chills and frequency. Pertinent negatives include no nausea.  Urinary Frequency  Associated symptoms include chills and frequency. Pertinent negatives include no nausea.    HPI   The patient is here to follow up on chronic paranoid depression, anxiety,  and chronic moderate rectal pain symptoms controlled with medicines, diet and exercise. She was seen in ER this am. C/o spasms in legs and arms at nights; not sleeping  BP Readings from Last 3 Encounters:  08/14/11 130/90  08/14/11 128/56  07/23/11 161/82   Wt Readings from Last 3 Encounters:  08/14/11 131 lb (59.421 kg)  08/14/11 130 lb 4.8 oz (59.104 kg)  07/23/11 134 lb 6.4 oz (60.963 kg)     BP 130/90  Pulse 88  Temp(Src) 98.3 F (36.8 C) (Oral)  Resp 16  Wt 131 lb (59.421 kg)  Past Medical History  Diagnosis Date  . CAD (coronary artery disease)     Catheterization, January, 2007 patent grafts / hospitalization October 2 010 no MI  . Syncope 03/2009    ?orthostasis  . Allergic rhinitis   . History of colon polyps   . Diverticulosis of colon   . GERD (gastroesophageal reflux disease)   . Hyperlipidemia   . Abdominal  pain, other specified site     motility disorder, chorinic functional, severe (Dr Juanda Chance). Possible OCD w/complusive use of enemas and laxatives; psychotic features 2011  . Positive H. pylori test 2911  . Psychosomatic disorder     w/GI fixation  . Anxiety   . Depression   . Stress incontinence, female   . Vitamin d deficiency   . Vitamin B12 deficiency   . Renal insufficiency   . UTI (urinary tract infection)   . Hypokalemia     mild  . Insomnia   . Breast cancer 2011    Dr Welton Flakes  .  Carotid artery disease     Doppler November, 8469, 0-39% LICA, 60-79% R. ICA.Marland Kitchen increased velocity... possibly from serpentine vessel  . Ejection fraction     50-55%, no wall motion abnormalities, echo, November, 2010  . Edema   . Hemothorax on right    Past Surgical History  Procedure Date  . Coronary artery bypass graft   . Myomectomy     Uterine and ovarian  . Breast fibroadenoma surgery   . Hemicolectomy 2005    Left  . Breast lumpectomy 2011  . Right vats (video-assisted thoracoscopic surgery) with     reports that she has never smoked. She does not have any smokeless tobacco history on file. She reports that she does not drink alcohol or use illicit drugs. family history includes Arthritis in her mother; Colon cancer in her mother; Coronary artery disease in an unspecified family member; and Heart disease in her father and mother. Allergies  Allergen Reactions  . Baclofen     REACTION: confusion  . Belladonna   . Ciprofloxacin   . Doxycycline   . Erythromycin   . Gabapentin   . Iohexol      Code: RASH   . Metoclopramide     Clonic spasms  . Metronidazole     REACTION: rash  .  Nalbuphine   . Nitrofurantoin     REACTION: nausea  . Penicillins   . Procaine Hcl   . Sucralfate     REACTION: nausea  . Sulfonamide Derivatives    Current Outpatient Prescriptions on File Prior to Visit  Medication Sig Dispense Refill  . anastrozole (ARIMIDEX) 1 MG tablet TAKE 1 TABLET BY MOUTH EVERY DAY  90 tablet  3  . conjugated estrogens (PREMARIN) vaginal cream Place 0.5 Applicatorfuls vaginally 2 (two) times a week.  42.5 g  5  . diphenhydrAMINE (BENADRYL) 25 mg capsule Take 1 capsule (25 mg total) by mouth every 6 (six) hours as needed for itching.  60 capsule  3  . divalproex (DEPAKOTE) 250 MG DR tablet Take 1 tablet (250 mg total) by mouth 2 (two) times daily.  60 tablet  11  . ferrous sulfate 325 (65 FE) MG tablet Take 1 tablet (325 mg total) by mouth daily.  30 tablet  3  .  KLOR-CON M10 10 MEQ tablet TAKE 1 TABLET BY MOUTH 2 TIMES A DAY  60 tablet  5  . Lactulose 20 GM/30ML SOLN Take 30-60 mLs by mouth as needed.        . methadone (DOLOPHINE) 10 MG tablet Take 1 tablet (10 mg total) by mouth 5 (five) times daily as needed. Fill on or after 07/26/11. Once every 30 days  150 tablet  0  . metoprolol succinate (TOPROL-XL) 25 MG 24 hr tablet TAKE 1 TABLET BY MOUTH EVERY DAY  30 tablet  5  . omeprazole (PRILOSEC) 20 MG capsule Take 1 capsule (20 mg total) by mouth 2 (two) times daily.  60 capsule  11  . Pancrelipase, Lip-Prot-Amyl, (CREON) 24000 UNITS CPEP Take 1 capsule (24,000 Units total) by mouth 3 (three) times daily with meals.  90 capsule  5  . promethazine (PHENERGAN) 25 MG tablet TAKE 1 TO 2 TABLETS BY MOUTH 4 TIMES A DAY AS NEEDED FOR NAUSEA  60 tablet  5  . triamcinolone ointment (KENALOG) 0.1 % Apply topically 2 (two) times daily.  90 g  3  . trimethoprim (TRIMPEX) 100 MG tablet TAKE 1 TABLET BY MOUTH EVERY DAY  30 tablet  5  . diazepam (VALIUM) 5 MG tablet Take 1 tablet (5 mg total) by mouth every 6 (six) hours as needed for anxiety or sleep.  120 tablet  3   No current facility-administered medications on file prior to visit.      Review of Systems  Constitutional: Positive for chills and fatigue. Negative for activity change, appetite change and unexpected weight change.  HENT: Negative for congestion, mouth sores and sinus pressure.   Eyes: Negative for visual disturbance.  Respiratory: Negative for cough and chest tightness.   Gastrointestinal: Positive for constipation. Negative for nausea, abdominal pain, diarrhea and blood in stool.  Genitourinary: Positive for dysuria and frequency. Negative for difficulty urinating and vaginal pain.  Musculoskeletal: Negative for back pain and gait problem.  Skin: Negative for pallor and rash.  Neurological: Negative for dizziness, tremors, weakness, numbness and headaches.  Psychiatric/Behavioral: Positive for  behavioral problems and dysphoric mood. Negative for suicidal ideas, hallucinations, confusion and sleep disturbance. The patient is nervous/anxious.        Objective:   Physical Exam  Constitutional: She appears well-developed. No distress.       Chron ill appearing  HENT:  Head: Normocephalic.  Right Ear: External ear normal.  Left Ear: External ear normal.  Nose: Nose normal.  Mouth/Throat: Oropharynx  is clear and moist.  Eyes: Conjunctivae are normal. Pupils are equal, round, and reactive to light. Right eye exhibits no discharge. Left eye exhibits no discharge.  Neck: Normal range of motion. Neck supple. No JVD present. No tracheal deviation present. No thyromegaly present.  Cardiovascular: Normal rate, regular rhythm and normal heart sounds.   Pulmonary/Chest: No stridor. No respiratory distress. She has no wheezes.  Abdominal: Soft. Bowel sounds are normal. She exhibits no distension and no mass. There is tenderness (mild). There is no rebound and no guarding.  Musculoskeletal: She exhibits no edema and no tenderness.  Lymphadenopathy:    She has no cervical adenopathy.  Neurological: She displays normal reflexes. No cranial nerve deficit. She exhibits normal muscle tone. Coordination normal.  Skin: No rash noted. No erythema.  Psychiatric: Her behavior is normal. Thought content normal.     Lab Results  Component Value Date   WBC 6.3 07/23/2011   HGB 11.7 07/23/2011   HCT 34.8 07/23/2011   PLT 216 07/23/2011   GLUCOSE 121* 07/23/2011   CHOL 302* 10/07/2009   TRIG 274.0* 10/07/2009   HDL 51.50 10/07/2009   LDLDIRECT 195.8 10/07/2009   LDLCALC  Value: 280        Total Cholesterol/HDL:CHD Risk Coronary Heart Disease Risk Table                     Men   Women  1/2 Average Risk   3.4   3.3  Average Risk       5.0   4.4  2 X Average Risk   9.6   7.1  3 X Average Risk  23.4   11.0        Use the calculated Patient Ratio above and the CHD Risk Table to determine the patient's CHD Risk.         ATP III CLASSIFICATION (LDL):  <100     mg/dL   Optimal  161-096  mg/dL   Near or Above                    Optimal  130-159  mg/dL   Borderline  045-409  mg/dL   High  >811     mg/dL   Very High* 91/08/7827   ALT 14 07/23/2011   AST 20 07/23/2011   NA 131* 07/23/2011   K 3.9 07/23/2011   CL 96 07/23/2011   CREATININE 1.08 07/23/2011   BUN 19 07/23/2011   CO2 28 07/23/2011   TSH 4.12 03/27/2011   INR 1.02 12/25/2010         Assessment & Plan:   A very complex case.Marland KitchenMarland Kitchen

## 2011-08-14 NOTE — Assessment & Plan Note (Signed)
4/13 clonic due to reglan D/c reglan Start Diazepam

## 2011-08-14 NOTE — ED Notes (Signed)
QMV:[id number]<NG> Expected date:08/14/11<BR> Expected time: 1:22 AM<BR> Means of arrival:Ambulance<BR> Comments:<BR> Altered mental status

## 2011-08-14 NOTE — Assessment & Plan Note (Signed)
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 addiction risk, apnea etc) and complications (i.e. Somnolence, constipation and others) were explained to the patient and were aknowledged.

## 2011-08-14 NOTE — ED Notes (Signed)
Pt's family to desk, requesting to see MD. RN informed Dr. Norlene Campbell notified. Family informed of status.

## 2011-08-14 NOTE — Assessment & Plan Note (Signed)
Chronic due to GI pain. Unable to reduce or to d/c  Potential benefits of a long term opioids use as well as potential risks (i.e. addiction risk, apnea etc) and complications (i.e. Somnolence, constipation and others) were explained to the patient and were aknowledged.

## 2011-08-14 NOTE — ED Notes (Signed)
Pt calm; no s/s of distress.

## 2011-08-14 NOTE — Assessment & Plan Note (Signed)
Risks associated with treatment noncompliance were discussed. Compliance was encouraged. Restart Vit B12

## 2011-08-14 NOTE — Discharge Instructions (Signed)
Please follow up as scheduled with Dr Posey Rea and with the [facility] as scheduled this week.  You were given a dose of Gentamycin today in the ER for your urinary tract infection.  This will need to continue--discuss with your doctors.  ???????????? ??????????? ????????????? ????? (Urinary Tract Infection) ???????? ????????????? ????? (??????????? ???????) ????? ????????? ? ?????? ???????. ???????? ???????? ?????? (??????), ???????? ????? (???????????) ? ???????? ???????? (?????????) - ??????? ???????? ??????????? ???????. ?????? ????? ???????????? ??????????? ????????, ???? ?? ?????? ?????????????. ?????????? ????????? ?????? ???? ??????? ?????????????. ?? ????? ????????????? ???? ????? ??? ????? ????????? ???? ????? ?????? ?????? ????????, ?? ????????? ????????? ?????? ???? ??????????? ???? ???????, ????? ???????? ????????? ? ?????????, ??? ????? ????????? ?????????? ???????.  ???????????? ?? ????? ? ???????? ????????  ????? ?????????? ???? ? ???? ????????, ????? ???? ???? ??? ?????????? ??? ??????-??????. ???????? ??????? ?????????? ???, ?????? ???????? ?????????? ????.   ?? ???????????? ???????, ?????????? ??????, ?? ????? ???, ???????????? ???????, ????????? ??? ????? ??????? ??????????? ???????? ??????.   ???????? ??????? ????? ???????? ? ??????????? ????????.   ?????? ???????? ??????????? ??? ????????? ??????????? ??? ????, ???????????, ??? ????????? ??? ?????????? ????? ????.  ????? ????????????? ???????? ? ????????? ????? ????????:  ????? ????????. ????????? ????????????? ????? ?????????? ?????? ? ??????????????.   ????? ????? ???????? ??????? ??????????? ?????????? ?? ???? ?? ??????, ????????? ????????? ??????? ????????? ??????.   ??????????? ??????? ?????? ?? ? ????? ?????.  ????????? ??????????? ???????????? ?? ??? ?????????? ???????????? ????? ???????? ?? ????? ??????? ??????. ???? ???? ?????????? ?? ?????? ?? ????? ?????? ??????? ??????, ???????????? ? ?????????????? ??????  ? ?????, ????? ?????? ??????????. ?? ????????, ??? ? ??? ??????? ?????????? ?????? ??????, ??? ? ???? ?? ???????? ???? ??? ????????????? ???????. ?????, ????? ?? ?????????????? ???????? ??? ?????????? ?????? ????????????.  ?????????? ? ?????, ????:  ? ??? ????????? ???? ? ?????.   ??? ??????? ?????? 3 ???????, ? ????/??? ???????????, ?????????? ?????????, ????????? 100,5 F (38,1 C) ? ???????????? ?????? ?????? ???.   ???????? ?? ?????????? ?? ?????? ????. ?????????? ? ????? ??????, ???? ???????? ??????????.  ?????????? ?????????? ? ?????, ????:  ??????? ???? ? ???? ????? ??? ? ???? ??????.   ??????? ?????.   ? ??? ?????????? ???????????.   ??? ??????? ?????? 3 ???????, ? ????/??? ???????????, ?????????? ?????????, ????????? 102 F (38,9 C).   ??? ??????? ?????? 3 ???????, ? ????/??? ???????????, ?????????? ?????????, ????????? 100,4 F (38 C).   ??????? ? ?????.   ??? ?????????????? ?????????? ???????? ??????????? ? ??????.  ?? ??????:  ?? ????????? ?????? ?????????? ?? ????? ????? ???????.   ?????? ?????????????? ????????? ????? ??????? ?? ????? ??????????.   ?????????? ?????????? ?? ??????????? ??????? ???????? ????????????.  Document Released: 04/23/2005 Document Revised: 04/12/2011 [facility] Patient Information 2012 [place], [place].

## 2011-08-14 NOTE — Assessment & Plan Note (Signed)
Obsessive use of enemas - s/p multiple unsuccessful attempts to treat. She has declined psychiatric help multiple times. Poor prognosis: enema overuse leads to abd pain and a chronic use of methadone  Will obtain Psych cons w/Dr Tonita Phoenix

## 2011-08-14 NOTE — ED Provider Notes (Signed)
History     CSN: [id number]  Arrival date & time 08/14/11  0131   First MD Initiated Contact with Patient 08/14/11 0441      Chief Complaint  Patient presents with  . Urinary Tract Infection    pt with recent dx of uti, now presents with increased confusion for past 2 days per family.    (Consider location/radiation/quality/duration/timing/severity/associated sxs/prior treatment) HPI 78 year old female presents to emergency department with her family with report of worsening agitation, confusion and concern for worsening urinary tract infection. Daughter reports patient was treated 2 weeks ago by urology for her urinary tract infection. Patient with multiple allergies and resistance in prior UTIs, and was treated with 5 shots of gentamicin. Patient has had several months of fidgety behavior where she cannot settle in any one spot it isn't worse over the last 2 weeks and worse yet over the last 3 days. Family reports she is sleeping during the day and awake at night, over the last 3 days they have been unable to manage her due to her agitation at times. Patient has an appointment with Dr.Plotnikov later this morning, also due to see urology later this week. Patient has had no fevers. She is eating and drinking well. No nausea vomiting diarrhea. She is not complaining of headache. Patient has had some lower abdominal pain Past Medical History  Diagnosis Date  . CAD (coronary artery disease)     Catheterization, January, 2007 patent grafts / hospitalization October 2 010 no MI  . Syncope 03/2009    ?orthostasis  . Allergic rhinitis   . History of colon polyps   . Diverticulosis of colon   . GERD (gastroesophageal reflux disease)   . Hyperlipidemia   . Abdominal  pain, other specified site     motility disorder, chorinic functional, severe (Dr Juanda Chance). Possible OCD w/complusive use of enemas and laxatives; psychotic features 2011  . Positive H. pylori test 2911  . Psychosomatic disorder       w/GI fixation  . Anxiety   . Depression   . Stress incontinence, female   . Vitamin d deficiency   . Vitamin B12 deficiency   . Renal insufficiency   . UTI (urinary tract infection)   . Hypokalemia     mild  . Insomnia   . Breast cancer 2011    Dr Welton Flakes  . Carotid artery disease     Doppler November, 8469, 0-39% LICA, 60-79% R. ICA.Marland Kitchen increased velocity... possibly from serpentine vessel  . Ejection fraction     50-55%, no wall motion abnormalities, echo, November, 2010  . Edema   . Hemothorax on right     Past Surgical History  Procedure Date  . Coronary artery bypass graft   . Myomectomy     Uterine and ovarian  . Breast fibroadenoma surgery   . Hemicolectomy 2005    Left  . Breast lumpectomy 2011  . Right vats (video-assisted thoracoscopic surgery) with     Family History  Problem Relation Age of Onset  . Coronary artery disease      Female 1st degree relative <60  . Colon cancer Mother   . Arthritis Mother   . Heart disease Mother   . Heart disease Father     History  Substance Use Topics  . Smoking status: Never Smoker   . Smokeless tobacco: Not on file  . Alcohol Use: No    OB History    Grav Para Term Preterm Abortions TAB SAB Ect Mult  Living                  Review of Systems  Unable to perform ROS: Dementia    Allergies  Baclofen; Belladonna; Ciprofloxacin; Doxycycline; Erythromycin; Gabapentin; Iohexol; Metronidazole; Nalbuphine; Nitrofurantoin; Penicillins; Procaine hcl; Sucralfate; and Sulfonamide derivatives  Home Medications   Current Outpatient Rx  Name Route Sig Dispense Refill  . ANASTROZOLE 1 MG PO TABS  TAKE 1 TABLET BY MOUTH EVERY DAY 90 tablet 3  . ESTROGENS, CONJUGATED 0.625 MG/GM VA CREA Vaginal Place 0.5 Applicatorfuls vaginally 2 (two) times a week. 42.5 g 5  . DIPHENHYDRAMINE HCL 25 MG PO CAPS Oral Take 1 capsule (25 mg total) by mouth every 6 (six) hours as needed for itching. 60 capsule 3  . DIVALPROEX SODIUM 250 MG  PO TBEC Oral Take 1 tablet (250 mg total) by mouth 2 (two) times daily. 60 tablet 11  . FERROUS SULFATE 325 (65 FE) MG PO TABS Oral Take 1 tablet (325 mg total) by mouth daily. 30 tablet 3  . KLOR-CON M10 10 MEQ PO TBCR  TAKE 1 TABLET BY MOUTH 2 TIMES A DAY 60 tablet 5  . LACTULOSE 20 GM/30ML PO SOLN Oral Take 30-60 mLs by mouth as needed.      Marland Kitchen LORAZEPAM 1 MG PO TABS Oral Take 1 tablet (1 mg total) by mouth every 8 (eight) hours as needed. 90 tablet 5  . METHADONE HCL 10 MG PO TABS Oral Take 1 tablet (10 mg total) by mouth 5 (five) times daily as needed. Fill on or after 07/26/11. Once every 30 days 150 tablet 0    Please fill on or after 09/19/10   . METOCLOPRAMIDE HCL 5 MG PO TABS  TAKE 1 TABLET BY MOUTH 3 TIMES A DAY BEFORE MEALS 90 tablet 0  . METOPROLOL SUCCINATE ER 25 MG PO TB24  TAKE 1 TABLET BY MOUTH EVERY DAY 30 tablet 5  . OMEPRAZOLE 20 MG PO CPDR Oral Take 1 capsule (20 mg total) by mouth 2 (two) times daily. 60 capsule 11  . PANCRELIPASE (LIP-PROT-AMYL) 24000 UNITS PO CPEP Oral Take 1 capsule (24,000 Units total) by mouth 3 (three) times daily with meals. 90 capsule 5  . PROMETHAZINE HCL 25 MG PO TABS  TAKE 1 TO 2 TABLETS BY MOUTH 4 TIMES A DAY AS NEEDED FOR NAUSEA 60 tablet 5  . TRIAMCINOLONE ACETONIDE 0.1 % EX OINT Topical Apply topically 2 (two) times daily. 90 g 3  . TRIMETHOPRIM 100 MG PO TABS  TAKE 1 TABLET BY MOUTH EVERY DAY 30 tablet 5  . ZOLPIDEM TARTRATE 10 MG PO TABS Oral Take 1 tablet (10 mg total) by mouth at bedtime as needed. 30 tablet 5    BP 128/56  Pulse 67  Temp(Src) 98.3 F (36.8 C) (Oral)  Resp 18  Ht 5\' 5"  (1.651 m)  Wt 130 lb 4.8 oz (59.104 kg)  BMI 21.68 kg/m2  SpO2 98%  Physical Exam  Constitutional: She appears well-developed and well-nourished. No distress.  HENT:  Head: Normocephalic and atraumatic.  Neck: Normal range of motion. Neck supple. No JVD present.  Cardiovascular: Normal rate, regular rhythm, normal heart sounds and intact distal  pulses.   Pulmonary/Chest: Effort normal and breath sounds normal. No stridor. No respiratory distress. She has no wheezes. She has no rales. She exhibits no tenderness.  Abdominal: Soft. Bowel sounds are normal. She exhibits no distension and no mass. There is tenderness (mild tenderness in pubic region). There is  no rebound and no guarding.  Musculoskeletal: She exhibits edema. She exhibits no tenderness.  Lymphadenopathy:    She has no cervical adenopathy.  Neurological: She is alert.  Skin: Skin is warm and dry. She is not diaphoretic.    ED Course  Procedures (including critical care time)  Labs Reviewed  URINALYSIS, ROUTINE W REFLEX MICROSCOPIC - Abnormal; Notable for the following:    Nitrite POSITIVE (*)    Leukocytes, UA SMALL (*)    All other components within normal limits  URINE MICROSCOPIC-ADD ON - Abnormal; Notable for the following:    Squamous Epithelial / LPF FEW (*)    Bacteria, UA MANY (*)    All other components within normal limits  URINE CULTURE   No results found.   1. Urinary tract infection       MDM  78 year old female with worsening of her underlying dementia/delirium with signs of urinary tract infection. Family reports patient's behavior slightly improved wall in the emergency department. Patient with multiple allergies to most antibiotics I would like to treat her urinary tract with. As she has been treated with gentamicin before, we'll give a dose of this. Patient has been given Ativan to help with her agitation as well. Family is comfortable taking her home to followup with Dr. Jennette Banker cough in the morning.        Olivia Mackie, MD 08/14/11 [phone]

## 2011-08-14 NOTE — Assessment & Plan Note (Signed)
Discussed D/c Lorazepam and Ambien. Start Diazepam to help w/clonic events

## 2011-08-14 NOTE — ED Notes (Signed)
Family to desk requesting to see MD. MD aware. Pt resting in bed; no acute distress noted.

## 2011-08-15 ENCOUNTER — Telehealth: Payer: Self-pay | Admitting: Internal Medicine

## 2011-08-15 LAB — URINE CULTURE: Colony Count: >=100000

## 2011-08-15 NOTE — Telephone Encounter (Signed)
Pt's daughter Jearld Adjutant informed.

## 2011-08-15 NOTE — Telephone Encounter (Signed)
[name], please, inform patient's dtr that all labs are ok except for low sodium - needs to cut back on enemas Thx

## 2011-08-18 NOTE — ED Notes (Signed)
Chart back from the EDP office; Rx for Cipro 500 mg

## 2011-08-21 ENCOUNTER — Encounter (HOSPITAL_COMMUNITY): Payer: Self-pay

## 2011-08-21 ENCOUNTER — Emergency Department (HOSPITAL_COMMUNITY)
Admission: EM | Admit: 2011-08-21 | Discharge: 2011-08-21 | Disposition: A | Discharge status: Home / Self Care | Payer: Medicare Other | Attending: Emergency Medicine | Admitting: Emergency Medicine

## 2011-08-21 ENCOUNTER — Emergency Department (INDEPENDENT_AMBULATORY_CARE_PROVIDER_SITE_OTHER)
Admission: EM | Admit: 2011-08-21 | Discharge: 2011-08-21 | Disposition: A | Discharge status: Nursing Home (Includes ICF) | Payer: Medicare Other | Source: Home / Self Care

## 2011-08-21 DIAGNOSIS — N390 Urinary tract infection, site not specified: Secondary | ICD-10-CM

## 2011-08-21 DIAGNOSIS — Z853 Personal history of malignant neoplasm of breast: Secondary | ICD-10-CM | POA: Insufficient documentation

## 2011-08-21 DIAGNOSIS — F341 Dysthymic disorder: Secondary | ICD-10-CM | POA: Insufficient documentation

## 2011-08-21 DIAGNOSIS — I2510 Atherosclerotic heart disease of native coronary artery without angina pectoris: Secondary | ICD-10-CM | POA: Insufficient documentation

## 2011-08-21 DIAGNOSIS — K219 Gastro-esophageal reflux disease without esophagitis: Secondary | ICD-10-CM | POA: Insufficient documentation

## 2011-08-21 LAB — CBC
HCT: 33.5 % — ABNORMAL LOW (ref 36.0–46.0)
Hemoglobin: 11.6 g/dL — ABNORMAL LOW (ref 12.0–15.0)
MCH: 31.7 pg (ref 26.0–34.0)
MCHC: 34.6 g/dL (ref 30.0–36.0)

## 2011-08-21 LAB — POCT URINALYSIS DIP (DEVICE)
Bilirubin Urine: NEGATIVE
Glucose, UA: NEGATIVE mg/dL
Ketones, ur: NEGATIVE mg/dL
Nitrite: POSITIVE — AB
Protein, ur: NEGATIVE mg/dL
Specific Gravity, Urine: 1.02 (ref 1.005–1.030)
Urobilinogen, UA: 0.2 mg/dL (ref 0.0–1.0)
pH: 5.5 (ref 5.0–8.0)

## 2011-08-21 LAB — URINALYSIS, ROUTINE W REFLEX MICROSCOPIC
Bilirubin Urine: NEGATIVE
Hgb urine dipstick: NEGATIVE
Ketones, ur: NEGATIVE mg/dL
Specific Gravity, Urine: 1.017 (ref 1.005–1.030)
Urobilinogen, UA: 0.2 mg/dL (ref 0.0–1.0)
pH: 5.5 (ref 5.0–8.0)

## 2011-08-21 LAB — BASIC METABOLIC PANEL
BUN: 17 mg/dL (ref 6–23)
Chloride: 95 mEq/L — ABNORMAL LOW (ref 96–112)
Creatinine, Ser: 0.99 mg/dL (ref 0.50–1.10)
Glucose, Bld: 103 mg/dL — ABNORMAL HIGH (ref 70–99)
Potassium: 4.3 mEq/L (ref 3.5–5.1)

## 2011-08-21 LAB — URINE MICROSCOPIC-ADD ON

## 2011-08-21 MED ORDER — DEXTROSE 5 % IV SOLN
1.0000 g | Freq: Once | INTRAVENOUS | Status: AC
  Administered 2011-08-21: 1 g via INTRAVENOUS
  Filled 2011-08-21: qty 10

## 2011-08-21 MED ORDER — CEFUROXIME AXETIL 500 MG PO TABS: 500.0000 mg | ORAL_TABLET | Freq: Two times a day (BID) | ORAL | Status: AC

## 2011-08-21 MED ORDER — PHENAZOPYRIDINE HCL 200 MG PO TABS: 200.0000 mg | ORAL_TABLET | Freq: Three times a day (TID) | ORAL | Status: AC

## 2011-08-21 MED ORDER — ONDANSETRON HCL 4 MG/2ML IJ SOLN
4.0000 mg | Freq: Once | INTRAMUSCULAR | Status: AC
  Administered 2011-08-21: 4 mg via INTRAVENOUS
  Filled 2011-08-21: qty 2

## 2011-08-21 MED ORDER — MORPHINE SULFATE 2 MG/ML IJ SOLN
2.0000 mg | Freq: Once | INTRAMUSCULAR | Status: AC
  Administered 2011-08-21: 2 mg via INTRAVENOUS
  Filled 2011-08-21: qty 1

## 2011-08-21 NOTE — ED Provider Notes (Addendum)
History     CSN: [id number]  Arrival date & time 08/21/11  1414   None     Chief Complaint  Patient presents with  . Urinary Tract Infection    (Consider location/radiation/quality/duration/timing/severity/associated sxs/prior treatment) HPI History is obtained from patient and from patient's daughter. History from patient was obtained using medical interpreter from language line. Patient complains of dysuria and pain at urethral meatus worse with urination since today patient seen at [facility] today 7 here for further evaluation. Seen for urinary tract infection in the emergency department April 9 receive one dose of gentamicin. Symptoms were improved until today. She admits to nausea no vomiting no fever no flank pain no other complaint no other associated symptoms pain is worse with urination not improved with remaining Past Medical History  Diagnosis Date  . CAD (coronary artery disease)     Catheterization, January, 2007 patent grafts / hospitalization October 2 010 no MI  . Syncope 03/2009    ?orthostasis  . Allergic rhinitis   . History of colon polyps   . Diverticulosis of colon   . GERD (gastroesophageal reflux disease)   . Hyperlipidemia   . Abdominal  pain, other specified site     motility disorder, chorinic functional, severe (Dr Juanda Chance). Possible OCD w/complusive use of enemas and laxatives; psychotic features 2011  . Positive H. pylori test 2911  . Psychosomatic disorder     w/GI fixation  . Anxiety   . Depression   . Stress incontinence, female   . Vitamin d deficiency   . Vitamin B12 deficiency   . Renal insufficiency   . UTI (urinary tract infection)   . Hypokalemia     mild  . Insomnia   . Breast cancer 2011    Dr Welton Flakes  . Carotid artery disease     Doppler November, 1478, 0-39% LICA, 60-79% R. ICA.Marland Kitchen increased velocity... possibly from serpentine vessel  . Ejection fraction     50-55%, no wall motion abnormalities, echo, November,  2010  . Edema   . Hemothorax on right     Past Surgical History  Procedure Date  . Coronary artery bypass graft   . Myomectomy     Uterine and ovarian  . Breast fibroadenoma surgery   . Hemicolectomy 2005    Left  . Breast lumpectomy 2011  . Right vats (video-assisted thoracoscopic surgery) with     Family History  Problem Relation Age of Onset  . Coronary artery disease      Female 1st degree relative <60  . Colon cancer Mother   . Arthritis Mother   . Heart disease Mother   . Heart disease Father     History  Substance Use Topics  . Smoking status: Never Smoker   . Smokeless tobacco: Not on file  . Alcohol Use: No    OB History    Grav Para Term Preterm Abortions TAB SAB Ect Mult Living                  Review of Systems  Gastrointestinal: Positive for nausea.  Genitourinary: Positive for dysuria.    Allergies  Baclofen; Belladonna; Ciprofloxacin; Doxycycline; Erythromycin; Gabapentin; Iohexol; Metoclopramide; Metronidazole; Nalbuphine; Nitrofurantoin; Penicillins; Procaine hcl; Sucralfate; and Sulfonamide derivatives  Home Medications   Current Outpatient Rx  Name Route Sig Dispense Refill  . ANASTROZOLE 1 MG PO TABS  TAKE 1 TABLET BY MOUTH EVERY DAY 90 tablet 3  . ESTROGENS, CONJUGATED 0.625 MG/GM VA CREA  Vaginal Place 0.5 Applicatorfuls vaginally 2 (two) times a week. 42.5 g 5  . DIAZEPAM 5 MG PO TABS Oral Take 1 tablet (5 mg total) by mouth every 6 (six) hours as needed for anxiety or sleep. 120 tablet 3  . DIPHENHYDRAMINE HCL 25 MG PO CAPS Oral Take 1 capsule (25 mg total) by mouth every 6 (six) hours as needed for itching. 60 capsule 3  . DIVALPROEX SODIUM 250 MG PO TBEC Oral Take 1 tablet (250 mg total) by mouth 2 (two) times daily. 60 tablet 11  . KLOR-CON M10 10 MEQ PO TBCR  TAKE 1 TABLET BY MOUTH 2 TIMES A DAY 60 tablet 5  . LACTULOSE 20 GM/30ML PO SOLN Oral Take 30-60 mLs by mouth as needed.      Marland Kitchen LORATADINE 10 MG PO TABS Oral Take 10 mg by  mouth daily.    Marland Kitchen METHADONE HCL 10 MG PO TABS Oral Take 1 tablet (10 mg total) by mouth 5 (five) times daily as needed. Fill on or after 09/25/11. Once every 30 days 150 tablet 0    Please fill on or after 09/19/10   . METOPROLOL SUCCINATE ER 25 MG PO TB24  TAKE 1 TABLET BY MOUTH EVERY DAY 30 tablet 5  . OMEPRAZOLE 20 MG PO CPDR Oral Take 1 capsule (20 mg total) by mouth 2 (two) times daily. 60 capsule 11  . PANCRELIPASE (LIP-PROT-AMYL) 24000 UNITS PO CPEP Oral Take 1 capsule (24,000 Units total) by mouth 3 (three) times daily with meals. 90 capsule 5  . PROMETHAZINE HCL 25 MG PO TABS  TAKE 1 TO 2 TABLETS BY MOUTH 4 TIMES A DAY AS NEEDED FOR NAUSEA 60 tablet 5  . TRIAMCINOLONE ACETONIDE 0.1 % EX OINT Topical Apply topically 2 (two) times daily. 90 g 3  . TRIMETHOPRIM 100 MG PO TABS  TAKE 1 TABLET BY MOUTH EVERY DAY 30 tablet 5    BP 128/75  Pulse 80  Temp(Src) 98.1 F (36.7 C) (Oral)  Resp 20  SpO2 98%  Physical Exam  Nursing note and vitals reviewed. Constitutional: She appears well-developed and well-nourished.  HENT:  Head: Normocephalic and atraumatic.  Eyes: Conjunctivae are normal. Pupils are equal, round, and reactive to light.  Neck: Neck supple. No tracheal deviation present. No thyromegaly present.  Cardiovascular: Normal rate and regular rhythm.   No murmur heard. Pulmonary/Chest: Effort normal and breath sounds normal.  Abdominal: Soft. Bowel sounds are normal. She exhibits no distension and no mass. There is tenderness. There is no rebound and no guarding.       Tender at suprapubic area  Musculoskeletal: Normal range of motion. She exhibits no edema and no tenderness.  Neurological: She is alert. Coordination normal.  Skin: Skin is warm and dry. No rash noted.  Psychiatric: She has a normal mood and affect.    ED Course  Procedures (including critical care time)  Labs Reviewed - No data to display No results found.   No diagnosis found.  Results for orders  placed during the hospital encounter of 08/21/11  URINALYSIS, ROUTINE W REFLEX MICROSCOPIC      Component Value Range   Color, Urine YELLOW  YELLOW    APPearance CLOUDY (*) CLEAR    Specific Gravity, Urine 1.017  1.005 - 1.030    pH 5.5  5.0 - 8.0    Glucose, UA NEGATIVE  NEGATIVE (mg/dL)   Hgb urine dipstick NEGATIVE  NEGATIVE    Bilirubin Urine NEGATIVE  NEGATIVE  Ketones, ur NEGATIVE  NEGATIVE (mg/dL)   Protein, ur NEGATIVE  NEGATIVE (mg/dL)   Urobilinogen, UA 0.2  0.0 - 1.0 (mg/dL)   Nitrite POSITIVE (*) NEGATIVE    Leukocytes, UA MODERATE (*) NEGATIVE   BASIC METABOLIC PANEL      Component Value Range   Sodium 130 (*) 135 - 145 (mEq/L)   Potassium 4.3  3.5 - 5.1 (mEq/L)   Chloride 95 (*) 96 - 112 (mEq/L)   CO2 24  19 - 32 (mEq/L)   Glucose, Bld 103 (*) 70 - 99 (mg/dL)   BUN 17  6 - 23 (mg/dL)   Creatinine, Ser 4.09  0.50 - 1.10 (mg/dL)   Calcium 9.2  8.4 - 81.1 (mg/dL)   GFR calc non Af Amer 52 (*) >90 (mL/min)   GFR calc Af Amer 60 (*) >90 (mL/min)  CBC      Component Value Range   WBC 7.0  4.0 - 10.5 (K/uL)   RBC 3.66 (*) 3.87 - 5.11 (MIL/uL)   Hemoglobin 11.6 (*) 12.0 - 15.0 (g/dL)   HCT 91.4 (*) 78.2 - 46.0 (%)   MCV 91.5  78.0 - 100.0 (fL)   MCH 31.7  26.0 - 34.0 (pg)   MCHC 34.6  30.0 - 36.0 (g/dL)   RDW 95.6  21.3 - 08.6 (%)   Platelets 174  150 - 400 (K/uL)  URINE MICROSCOPIC-ADD ON      Component Value Range   Squamous Epithelial / LPF RARE  RARE    WBC, UA 11-20  <3 (WBC/hpf)   RBC / HPF 0-2  <3 (RBC/hpf)   Bacteria, UA MANY (*) RARE    No results found.   MDM  Prior urine culture from April 9 was sensitive to cephalosporins Plan give one dose Rocephin IV prior to discharge Prescription Ceftin 500 mg twice a day for one week Pyridium 200 mg tid for 2 day Followup Dr.Plotnikov Urine sent for c and s Diagnosis; urinary tract infection        Doug Sou, MD 08/21/11 [id number]  Doug Sou, MD 08/21/11 2118

## 2011-08-21 NOTE — ED Notes (Signed)
Per Dr. Ethelda Chick administer rocephin and will treat rash if patient has a reaction to this medication.

## 2011-08-21 NOTE — Discharge Instructions (Signed)
Your urine culture from your previous visit to the ER showed that it should respond to Ceftin and Rocephin antibiotics You were treated with an initial dose of IV Rocephin and we will send you home with a prescription for Ceftin to take by mouth. Please take ALL of the Ceftin until gone. You've also been given a prescription for pyridum, a medication to help with the burning sensation with urination. Plan to call Dr. Loren Racer office in the morning to make a follow up appointment within the next 2 days. If you run a high fever, are feeling confused, have nausea or vomiting, or have any concerns about worsening condition, please return to the ER immediately.  ???? ???? ???????? ?? ?????? ??????????? ?????? ? ER ???????, ??? ?? ?????? ??????????? ?? Ceftin ? Rocephin ???????????? ?? ???????? ????????? ???? IV Rocephin ? ?? ?????? ??? ????? ? ???????????? ??? Ceftin ????? ? ???. ??????????, ??? Ceftin ???? ???. ?? ????? ???? ???? ??????????? ??? pyridum, ???????, ????? ?????? ? ????????? ?????? ??? ??????????????. ???? ????????? ? ???? ??????? ?????????? ?????, ????? ??????? ?? ?????????? ? ??????? ????????? 2 ????. ???? ?? ??????????? ??????? ???????????, ????????? ??????, ???? ??????? ??? ?????, ??? ? ??? ???? ????????????? ?? ?????? ????????? ?????????, ????????? ? ER ??????????.   ???????????? ??????????? ????????????? ????? (Urinary Tract Infection) ???????? ????????????? ????? (??????????? ???????) ????? ????????? ? ?????? ???????. ???????? ???????? ?????? (??????), ???????? ????? (???????????) ? ???????? ???????? (?????????) - ??????? ???????? ??????????? ???????. ?????? ????? ???????????? ??????????? ????????, ???? ?? ?????? ?????????????. ?????????? ????????? ?????? ???? ??????? ?????????????. ?? ????? ????????????? ???? ????? ??? ????? ????????? ???? ????? ?????? ?????? ????????, ?? ????????? ????????? ?????? ???? ??????????? ???? ???????, ????? ???????? ????????? ? ?????????, ??? ????? ?????????  ?????????? ???????.  ???????????? ?? ????? ? ???????? ????????  ????? ?????????? ???? ? ???? ????????, ????? ???? ???? ??? ?????????? ??? ??????-??????. ???????? ??????? ?????????? ???, ?????? ???????? ?????????? ????.   ?? ???????????? ???????, ?????????? ??????, ?? ????? ???, ???????????? ???????, ????????? ??? ????? ??????? ??????????? ???????? ??????.   ???????? ??????? ????? ???????? ? ??????????? ????????.   ?????? ???????? ??????????? ??? ????????? ??????????? ??? ????, ???????????, ??? ????????? ??? ?????????? ????? ????.  ????? ????????????? ???????? ? ????????? ????? ????????:  ????? ????????. ????????? ????????????? ????? ?????????? ?????? ? ??????????????.   ????? ????? ???????? ??????? ??????????? ?????????? ?? ???? ?? ??????, ????????? ????????? ??????? ????????? ??????.   ??????????? ??????? ?????? ?? ? ????? ?????.  ????????? ??????????? ???????????? ?? ??? ?????????? ???????????? ????? ???????? ?? ????? ??????? ??????. ???? ???? ?????????? ?? ?????? ?? ????? ?????? ??????? ??????, ???????????? ? ?????????????? ?????? ? ?????, ????? ?????? ??????????. ?? ????????, ??? ? ??? ??????? ?????????? ?????? ??????, ??? ? ???? ?? ???????? ???? ??? ????????????? ???????. ?????, ????? ?? ?????????????? ???????? ??? ?????????? ?????? ????????????.  ?????????? ? ?????, ????:  ? ??? ????????? ???? ? ?????.   ??? ??????? ?????? 3 ???????, ? ????/??? ???????????, ?????????? ?????????, ????????? 100,5 F (38,1 C) ? ???????????? ?????? ?????? ???.   ???????? ?? ?????????? ?? ?????? ????. ?????????? ? ????? ??????, ???? ???????? ??????????.  ?????????? ?????????? ? ?????, ????:  ??????? ???? ? ???? ????? ??? ? ???? ??????.   ??????? ?????.   ? ??? ?????????? ???????????.   ??? ??????? ?????? 3 ???????, ? ????/??? ???????????, ?????????? ?????????, ????????? 102 F (38,9 C).   ??? ??????? ?????? 3 ???????, ? ????/??? ???????????, ?????????? ?????????, ????????? 100,4 F (38 C).    ??????? ? ?????.   ??? ?????????????? ?????????? ???????? ??????????? ? ??????.  ?? ??????:  ?? ????????? ?????? ?????????? ?? ????? ????? ???????.   ?????? ?????????????? ????????? ????? ??????? ?? ????? ??????????.   ?????????? ?????????? ?? ??????????? ??????? ???????? ????????????.  Document Released: 04/23/2005 Document Revised: 04/12/2011 [facility] Patient Information 2012 [place], [place].

## 2011-08-21 NOTE — ED Notes (Signed)
C/o painful urination since today, reports chronic UTIs. Husband states spoke with urologist and they told her to come in for possible ultrasound and "drop counter".

## 2011-08-21 NOTE — ED Provider Notes (Signed)
History     CSN: [id number]  Arrival date & time 08/21/11  1136   None     Chief Complaint  Patient presents with  . Urinary Tract Infection    (Consider location/radiation/quality/duration/timing/severity/associated sxs/prior treatment) HPI Comments: 78 yo female presents today with her husband. Hx of recurrent UTI, and reports has worsening dysuria, and "has become too much today." No fever or chills. Also has a hx of chronic abdominal pain - unchanged. No nausea or vomiting.  Pt has multiple drug allergies, and previously her UTIs have been treated in the ED with Gentamycin per my chart review.    Past Medical History  Diagnosis Date  . CAD (coronary artery disease)     Catheterization, January, 2007 patent grafts / hospitalization October 2 010 no MI  . Syncope 03/2009    ?orthostasis  . Allergic rhinitis   . History of colon polyps   . Diverticulosis of colon   . GERD (gastroesophageal reflux disease)   . Hyperlipidemia   . Abdominal  pain, other specified site     motility disorder, chorinic functional, severe (Dr Juanda Chance). Possible OCD w/complusive use of enemas and laxatives; psychotic features 2011  . Positive H. pylori test 2911  . Psychosomatic disorder     w/GI fixation  . Anxiety   . Depression   . Stress incontinence, female   . Vitamin d deficiency   . Vitamin B12 deficiency   . Renal insufficiency   . UTI (urinary tract infection)   . Hypokalemia     mild  . Insomnia   . Breast cancer 2011    Dr Welton Flakes  . Carotid artery disease     Doppler November, 0865, 0-39% LICA, 60-79% R. ICA.Marland Kitchen increased velocity... possibly from serpentine vessel  . Ejection fraction     50-55%, no wall motion abnormalities, echo, November, 2010  . Edema   . Hemothorax on right     Past Surgical History  Procedure Date  . Coronary artery bypass graft   . Myomectomy     Uterine and ovarian  . Breast fibroadenoma surgery   . Hemicolectomy 2005    Left  . Breast lumpectomy  2011  . Right vats (video-assisted thoracoscopic surgery) with     Family History  Problem Relation Age of Onset  . Coronary artery disease      Female 1st degree relative <60  . Colon cancer Mother   . Arthritis Mother   . Heart disease Mother   . Heart disease Father     History  Substance Use Topics  . Smoking status: Never Smoker   . Smokeless tobacco: Not on file  . Alcohol Use: No    OB History    Grav Para Term Preterm Abortions TAB SAB Ect Mult Living                  Review of Systems  Unable to perform ROS: Dementia    Allergies  Baclofen; Belladonna; Ciprofloxacin; Doxycycline; Erythromycin; Gabapentin; Iohexol; Metoclopramide; Metronidazole; Nalbuphine; Nitrofurantoin; Penicillins; Procaine hcl; Sucralfate; and Sulfonamide derivatives  Home Medications   Current Outpatient Rx  Name Route Sig Dispense Refill  . ANASTROZOLE 1 MG PO TABS  TAKE 1 TABLET BY MOUTH EVERY DAY 90 tablet 3  . ESTROGENS, CONJUGATED 0.625 MG/GM VA CREA Vaginal Place 0.5 Applicatorfuls vaginally 2 (two) times a week. 42.5 g 5  . DIAZEPAM 5 MG PO TABS Oral Take 1 tablet (5 mg total) by mouth every 6 (six) hours  as needed for anxiety or sleep. 120 tablet 3  . DIPHENHYDRAMINE HCL 25 MG PO CAPS Oral Take 1 capsule (25 mg total) by mouth every 6 (six) hours as needed for itching. 60 capsule 3  . DIVALPROEX SODIUM 250 MG PO TBEC Oral Take 1 tablet (250 mg total) by mouth 2 (two) times daily. 60 tablet 11  . FERROUS SULFATE 325 (65 FE) MG PO TABS Oral Take 1 tablet (325 mg total) by mouth daily. 30 tablet 3  . KLOR-CON M10 10 MEQ PO TBCR  TAKE 1 TABLET BY MOUTH 2 TIMES A DAY 60 tablet 5  . LACTULOSE 20 GM/30ML PO SOLN Oral Take 30-60 mLs by mouth as needed.      . METHADONE HCL 10 MG PO TABS Oral Take 1 tablet (10 mg total) by mouth 5 (five) times daily as needed. Fill on or after 09/25/11. Once every 30 days 150 tablet 0    Please fill on or after 09/19/10   . METOPROLOL SUCCINATE ER 25 MG PO  TB24  TAKE 1 TABLET BY MOUTH EVERY DAY 30 tablet 5  . OMEPRAZOLE 20 MG PO CPDR Oral Take 1 capsule (20 mg total) by mouth 2 (two) times daily. 60 capsule 11  . PANCRELIPASE (LIP-PROT-AMYL) 24000 UNITS PO CPEP Oral Take 1 capsule (24,000 Units total) by mouth 3 (three) times daily with meals. 90 capsule 5  . PROMETHAZINE HCL 25 MG PO TABS  TAKE 1 TO 2 TABLETS BY MOUTH 4 TIMES A DAY AS NEEDED FOR NAUSEA 60 tablet 5  . TRIAMCINOLONE ACETONIDE 0.1 % EX OINT Topical Apply topically 2 (two) times daily. 90 g 3  . TRIMETHOPRIM 100 MG PO TABS  TAKE 1 TABLET BY MOUTH EVERY DAY 30 tablet 5    BP 143/86  Pulse 84  Temp(Src) 97.8 F (36.6 C) (Oral)  Resp 20  SpO2 96%  Physical Exam  Nursing note and vitals reviewed. Constitutional: She appears well-developed and well-nourished. No distress.  Cardiovascular: Normal rate, regular rhythm and normal heart sounds.   Pulmonary/Chest: Effort normal and breath sounds normal. No respiratory distress.  Abdominal: Soft. Bowel sounds are normal. She exhibits no distension and no mass. There is tenderness in the suprapubic area.  Skin: Skin is warm and dry.    ED Course  Procedures (including critical care time)  Labs Reviewed  POCT URINALYSIS DIP (DEVICE) - Abnormal; Notable for the following:    Hgb urine dipstick TRACE (*)    Nitrite POSITIVE (*)    Leukocytes, UA SMALL (*) Biochemical Testing Only. Please order routine urinalysis from main lab if confirmatory testing is needed.   All other components within normal limits   No results found.   1. UTI (lower urinary tract infection)       MDM  Transfer to [facility] due to hx of recurrent UTI, multiple antibiotic allergies, and previously treated with Gentamycin. Pt and husband became agitated when I advised them of transfer to ED for treatment. They both stood up to leave stating that they were going home. After much encouragement they agreed to transfer.         Melody Comas, [place] 08/21/11  1410

## 2011-08-21 NOTE — ED Provider Notes (Signed)
Medical screening examination/treatment/procedure(s) were performed by non-physician practitioner and as supervising physician I was immediately available for consultation/collaboration.  Leslee Home, M.D.   Reuben Likes, MD 08/21/11 [phone]

## 2011-08-21 NOTE — ED Notes (Signed)
Pharmacy called regarding administration of Rocephin and patient having allergies to other medications. Pharmacy advised not to give if patient has anaphylactic reaction to penicillin and rocephin may be given if reaction is rash or nausea and should ask EDP to advise.  Daughter translated for patient, patient states she develops rash with penicillins.

## 2011-08-21 NOTE — ED Notes (Signed)
Sent from ucc for recurrent uti

## 2011-08-21 NOTE — ED Notes (Signed)
Pt with poor english, husband at bedside used for translation

## 2011-08-21 NOTE — ED Provider Notes (Signed)
5:45 PM Signout from Dr. Ethelda Chick. Pt who has been seen multiple times recently for UTI. Patient does have multiple allergies to antibiotics, so UTI treatment was complicated by antibiotic selection. She was last treated with a dose of IV gentamicin but was not sent home with further by mouth antibiotics. Urine culture showed sensitivities to Rocephin as well as Ceftin. Patient is to receive dose of IV Rocephin here. Anticipate discharge home after this with prescription for by mouth Ceftin. Patient does have an allergy to penicillin, which was apparently a rash. She denies any history of anaphylaxis with this.  8:15 PM Patient has been observed in the ED for over 2 hours now after receiving IV Rocephin. She has no evidence for rash, throat or tongue swelling. She denies any difficulty breathing. Patient and husband are ready to go home at this point. I did discuss the findings with them. Prescription written for Ceftin as well as pyridium. Patient is to followup with Dr. Posey Rea, her PCP, in the next one to 2 days for a recheck. Discussed reasons to return to the ED sooner. ED discharge instructions provided in English as well as [name] to ensure patient comprehension.  Grant Fontana, [place] 08/21/11 2227

## 2011-08-22 NOTE — ED Provider Notes (Signed)
Medical screening examination/treatment/procedure(s) were conducted as a shared visit with non-physician practitioner(s) and myself.  I personally evaluated the patient during the encounter  Doug Sou, MD 08/22/11 1656

## 2011-08-23 ENCOUNTER — Encounter (INDEPENDENT_AMBULATORY_CARE_PROVIDER_SITE_OTHER): Payer: Self-pay | Admitting: General Surgery

## 2011-08-23 ENCOUNTER — Ambulatory Visit (INDEPENDENT_AMBULATORY_CARE_PROVIDER_SITE_OTHER): Payer: Medicare Other | Admitting: General Surgery

## 2011-08-23 VITALS — BP 128/80 | HR 88 | Temp 98.6°F | Resp 18 | Ht 65.0 in | Wt 132.6 lb

## 2011-08-23 DIAGNOSIS — Z853 Personal history of malignant neoplasm of breast: Secondary | ICD-10-CM

## 2011-08-23 LAB — URINE CULTURE

## 2011-08-23 NOTE — Progress Notes (Signed)
Subjective:     Patient ID: [name], female   DOB: 1928/04/18, 78 y.o.   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 . She is following up with medical oncology a 6 month intervals right now. She has had a fall for which she had a thoracic surgery procedure. She also had another fall for which she has some left rib pain right now. This sounds like it is related to a fall. She reports no problems with her breasts at all. She is not had a mammogram since surgery.  Review of Systems  Constitutional: Positive for unexpected weight change. Negative for fever and chills.  HENT: Negative for hearing loss, congestion, sore throat, trouble swallowing and voice change.   Eyes: Negative for visual disturbance.  Respiratory: Negative for cough and wheezing.   Cardiovascular: Positive for leg swelling. Negative for chest pain and palpitations.  Gastrointestinal: Positive for abdominal pain. Negative for nausea, vomiting, diarrhea, constipation, blood in stool, abdominal distention and anal bleeding.  Genitourinary: Negative for hematuria, vaginal bleeding and difficulty urinating.  Musculoskeletal: Negative for arthralgias.  Skin: Negative for rash and wound.  Neurological: Negative for seizures, syncope and headaches.  Hematological: Negative for adenopathy. Does not bruise/bleed easily.  Psychiatric/Behavioral: Negative for confusion.       Objective:   Physical Exam  Vitals reviewed. Constitutional: She appears well-developed and well-nourished.  Pulmonary/Chest: Right breast exhibits no inverted nipple, no mass, no nipple discharge, no skin change and no tenderness. Left breast exhibits no inverted nipple, no mass, no nipple discharge, no skin change and no tenderness. Breasts are symmetrical.    Lymphadenopathy:    She has no cervical adenopathy.   She has no axillary adenopathy.       Right: No supraclavicular adenopathy present.       Left: No supraclavicular adenopathy present.       Assessment:     Stage I left breast cancer s/p lumpectomy    Plan:     She has no clinical evidence of recurrence today She needs to get a mammogram which we are ordering today She is seeing medical oncology at 6 month intervals and is hard for them to get to appts so I will plan on seeing her as needed.

## 2011-08-24 NOTE — ED Notes (Signed)
+   Urine  Treated per protocol MD; Sensitive to same 

## 2011-08-29 ENCOUNTER — Ambulatory Visit
Admission: RE | Admit: 2011-08-29 | Discharge: 2011-08-29 | Disposition: A | Discharge status: Home / Self Care | Payer: Medicare Other | Source: Ambulatory Visit | Attending: General Surgery | Admitting: General Surgery

## 2011-08-29 DIAGNOSIS — Z853 Personal history of malignant neoplasm of breast: Secondary | ICD-10-CM

## 2011-09-06 ENCOUNTER — Encounter: Payer: Self-pay | Admitting: Internal Medicine

## 2011-09-06 ENCOUNTER — Ambulatory Visit (INDEPENDENT_AMBULATORY_CARE_PROVIDER_SITE_OTHER): Payer: Medicare Other | Admitting: Internal Medicine

## 2011-09-06 DIAGNOSIS — F29 Unspecified psychosis not due to a substance or known physiological condition: Secondary | ICD-10-CM

## 2011-09-06 DIAGNOSIS — R109 Unspecified abdominal pain: Secondary | ICD-10-CM

## 2011-09-06 DIAGNOSIS — R259 Unspecified abnormal involuntary movements: Secondary | ICD-10-CM

## 2011-09-06 DIAGNOSIS — F411 Generalized anxiety disorder: Secondary | ICD-10-CM

## 2011-09-06 DIAGNOSIS — N760 Acute vaginitis: Secondary | ICD-10-CM | POA: Insufficient documentation

## 2011-09-06 DIAGNOSIS — R252 Cramp and spasm: Secondary | ICD-10-CM

## 2011-09-06 DIAGNOSIS — L299 Pruritus, unspecified: Secondary | ICD-10-CM

## 2011-09-06 DIAGNOSIS — F329 Major depressive disorder, single episode, unspecified: Secondary | ICD-10-CM

## 2011-09-06 MED ORDER — MOMETASONE FUROATE 0.1 % EX OINT: TOPICAL_OINTMENT | Freq: Two times a day (BID) | CUTANEOUS | Status: AC

## 2011-09-06 MED ORDER — INDAPAMIDE 2.5 MG PO TABS: 2.5000 mg | ORAL_TABLET | ORAL | Status: DC

## 2011-09-06 MED ORDER — ZOLPIDEM TARTRATE 10 MG PO TABS: 10.0000 mg | ORAL_TABLET | Freq: Every evening | ORAL | Status: DC | PRN

## 2011-09-06 MED ORDER — LORATADINE 10 MG PO TABS: 10.0000 mg | ORAL_TABLET | Freq: Every day | ORAL | Status: DC

## 2011-09-06 MED ORDER — VITAMIN D (ERGOCALCIFEROL) 1.25 MG (50000 UNIT) PO CAPS: 50000.0000 [IU] | ORAL_CAPSULE | ORAL | Status: AC

## 2011-09-06 MED ORDER — LORAZEPAM 1 MG PO TABS: 1.0000 mg | ORAL_TABLET | Freq: Three times a day (TID) | ORAL | Status: DC | PRN

## 2011-09-06 NOTE — Assessment & Plan Note (Signed)
Mometasone oint bid

## 2011-09-06 NOTE — Assessment & Plan Note (Signed)
She was not able to see a [name] speaking psychiatrist 5/13

## 2011-09-06 NOTE — Progress Notes (Signed)
Patient ID: [name], female   DOB: [birth year], 78 y.o.   MRN: [id number] Patient ID: [name], female   DOB: 08-Sep-1927, 78 y.o.   MRN: [id number] Patient ID: [name], female   DOB: 06-Jan-1928, 78 y.o.   MRN: [id number]  Subjective:    Patient ID: [name], female    DOB: 07-21-1927, 78 y.o.   MRN: [id number]  Dysuria  Associated symptoms include chills and frequency. Pertinent negatives include no nausea.  Urinary Frequency  Associated symptoms include chills and frequency. Pertinent negatives include no nausea.    C/o dysuria on 4/9 for bladder pain - she went to ER and given Methenamine - she developed a problem  w/vag rash and itching after the 2nd dose  C/o insomnia - current meds do not help  The patient is here to follow up on chronic paranoid depression, anxiety,  and chronic moderate rectal pain symptoms controlled with medicines, diet and exercise. She was seen in ER this am. C/o spasms in legs and arms at nights; not sleeping  BP Readings from Last 3 Encounters:  09/06/11 128/70  08/23/11 128/80  08/21/11 144/78   Wt Readings from Last 3 Encounters:  09/06/11 133 lb (60.328 kg)  08/23/11 132 lb 9.6 oz (60.147 kg)  08/14/11 131 lb (59.421 kg)     BP 128/70  Pulse 80  Temp(Src) 98.2 F (36.8 C) (Oral)  Resp 16  Wt 133 lb (60.328 kg)  Past Medical History  Diagnosis Date  . CAD (coronary artery disease)     Catheterization, January, 2007 patent grafts / hospitalization October 2 010 no MI  . Syncope 03/2009    ?orthostasis  . Allergic rhinitis   . History of colon polyps   . Diverticulosis of colon   . GERD (gastroesophageal reflux disease)   . Hyperlipidemia   . Abdominal  pain, other specified site     motility disorder, chorinic functional, severe (Dr Juanda Chance). Possible OCD w/complusive use of enemas and laxatives; psychotic features 2011  . Positive H. pylori test 2911  . Psychosomatic disorder     w/GI  fixation  . Anxiety   . Depression   . Stress incontinence, female   . Vitamin d deficiency   . Vitamin B12 deficiency   . Renal insufficiency   . UTI (urinary tract infection)   . Hypokalemia     mild  . Insomnia   . Carotid artery disease     Doppler November, 6295, 0-39% LICA, 60-79% R. ICA.Marland Kitchen increased velocity... possibly from serpentine vessel  . Ejection fraction     50-55%, no wall motion abnormalities, echo, November, 2010  . Edema   . Hemothorax on right   . Breast cancer 2011    Dr Welton Flakes- right breast   Past Surgical History  Procedure Date  . Coronary artery bypass graft   . Myomectomy     Uterine and ovarian  . Breast fibroadenoma surgery   . Hemicolectomy 2005    Left  . Breast lumpectomy 2011  . Right vats (video-assisted thoracoscopic surgery) with     reports that she has never smoked. She does not have any smokeless tobacco history on file. She reports that she does not drink alcohol or use illicit drugs. family history includes Arthritis in her mother; Colon cancer in her mother; Coronary artery disease in an unspecified family member; and Heart disease in her father and mother. Allergies  Allergen Reactions  . Baclofen  REACTION: confusion  . Belladonna   . Ciprofloxacin   . Doxycycline   . Erythromycin   . Gabapentin   . Iohexol      Code: RASH   . Metoclopramide     Clonic spasms  . Metronidazole     REACTION: rash  . Nalbuphine   . Nitrofurantoin     REACTION: nausea  . Penicillins   . Procaine Hcl   . Sucralfate     REACTION: nausea  . Sulfonamide Derivatives    Current Outpatient Prescriptions on File Prior to Visit  Medication Sig Dispense Refill  . anastrozole (ARIMIDEX) 1 MG tablet TAKE 1 TABLET BY MOUTH EVERY DAY  90 tablet  3  . conjugated estrogens (PREMARIN) vaginal cream Place 0.5 Applicatorfuls vaginally 2 (two) times a week.  42.5 g  5  . diazepam (VALIUM) 5 MG tablet Take 1 tablet (5 mg total) by mouth every 6 (six)  hours as needed for anxiety or sleep.  120 tablet  3  . diphenhydrAMINE (BENADRYL) 25 mg capsule Take 1 capsule (25 mg total) by mouth every 6 (six) hours as needed for itching.  60 capsule  3  . divalproex (DEPAKOTE) 250 MG DR tablet Take 1 tablet (250 mg total) by mouth 2 (two) times daily.  60 tablet  11  . KLOR-CON M10 10 MEQ tablet TAKE 1 TABLET BY MOUTH 2 TIMES A DAY  60 tablet  5  . Lactulose 20 GM/30ML SOLN Take 30-60 mLs by mouth as needed.        . loratadine (CLARITIN) 10 MG tablet Take 10 mg by mouth daily.      . methadone (DOLOPHINE) 10 MG tablet Take 1 tablet (10 mg total) by mouth 5 (five) times daily as needed. Fill on or after 09/25/11. Once every 30 days  150 tablet  0  . metoprolol succinate (TOPROL-XL) 25 MG 24 hr tablet TAKE 1 TABLET BY MOUTH EVERY DAY  30 tablet  5  . omeprazole (PRILOSEC) 20 MG capsule Take 1 capsule (20 mg total) by mouth 2 (two) times daily.  60 capsule  11  . Pancrelipase, Lip-Prot-Amyl, (CREON) 24000 UNITS CPEP Take 1 capsule (24,000 Units total) by mouth 3 (three) times daily with meals.  90 capsule  5  . promethazine (PHENERGAN) 25 MG tablet TAKE 1 TO 2 TABLETS BY MOUTH 4 TIMES A DAY AS NEEDED FOR NAUSEA  60 tablet  5  . triamcinolone ointment (KENALOG) 0.1 % Apply topically 2 (two) times daily.  90 g  3  . trimethoprim (TRIMPEX) 100 MG tablet TAKE 1 TABLET BY MOUTH EVERY DAY  30 tablet  5      Review of Systems  Constitutional: Positive for chills and fatigue. Negative for activity change, appetite change and unexpected weight change.  HENT: Negative for congestion, mouth sores and sinus pressure.   Eyes: Negative for visual disturbance.  Respiratory: Negative for cough and chest tightness.   Gastrointestinal: Positive for constipation. Negative for nausea, abdominal pain, diarrhea and blood in stool.  Genitourinary: Positive for dysuria and frequency. Negative for difficulty urinating and vaginal pain.  Musculoskeletal: Negative for back pain  and gait problem.  Skin: Negative for pallor and rash.  Neurological: Negative for dizziness, tremors, weakness, numbness and headaches.  Psychiatric/Behavioral: Positive for behavioral problems and dysphoric mood. Negative for suicidal ideas, hallucinations, confusion and sleep disturbance. The patient is nervous/anxious.        Objective:   Physical Exam  Constitutional: She appears  well-developed. No distress.       Chron ill appearing  HENT:  Head: Normocephalic.  Right Ear: External ear normal.  Left Ear: External ear normal.  Nose: Nose normal.  Mouth/Throat: Oropharynx is clear and moist.  Eyes: Conjunctivae are normal. Pupils are equal, round, and reactive to light. Right eye exhibits no discharge. Left eye exhibits no discharge.  Neck: Normal range of motion. Neck supple. No JVD present. No tracheal deviation present. No thyromegaly present.  Cardiovascular: Normal rate, regular rhythm and normal heart sounds.   Pulmonary/Chest: No stridor. No respiratory distress. She has no wheezes.  Abdominal: Soft. Bowel sounds are normal. She exhibits no distension and no mass. There is tenderness (mild). There is no rebound and no guarding.  Musculoskeletal: She exhibits no edema and no tenderness.  Lymphadenopathy:    She has no cervical adenopathy.  Neurological: She displays normal reflexes. No cranial nerve deficit. She exhibits normal muscle tone. Coordination normal.  Skin: No rash noted. No erythema.  Psychiatric: Her behavior is normal. Thought content normal.     Lab Results  Component Value Date   WBC 7.0 08/21/2011   HGB 11.6* 08/21/2011   HCT 33.5* 08/21/2011   PLT 174 08/21/2011   GLUCOSE 103* 08/21/2011   CHOL 302* 10/07/2009   TRIG 274.0* 10/07/2009   HDL 51.50 10/07/2009   LDLDIRECT 195.8 10/07/2009   LDLCALC  Value: 280        Total Cholesterol/HDL:CHD Risk Coronary Heart Disease Risk Table                     Men   Women  1/2 Average Risk   3.4   3.3  Average Risk        5.0   4.4  2 X Average Risk   9.6   7.1  3 X Average Risk  23.4   11.0        Use the calculated Patient Ratio above and the CHD Risk Table to determine the patient's CHD Risk.        ATP III CLASSIFICATION (LDL):  <100     mg/dL   Optimal  161-096  mg/dL   Near or Above                    Optimal  130-159  mg/dL   Borderline  045-409  mg/dL   High  >811     mg/dL   Very High* 91/08/7827   ALT 18 08/14/2011   AST 20 08/14/2011   NA 130* 08/21/2011   K 4.3 08/21/2011   CL 95* 08/21/2011   CREATININE 0.99 08/21/2011   BUN 17 08/21/2011   CO2 24 08/21/2011   TSH 1.82 08/14/2011   INR 1.02 12/25/2010         Assessment & Plan:   A very complex case.Marland KitchenMarland Kitchen

## 2011-09-06 NOTE — Assessment & Plan Note (Signed)
Continue with current prescription therapy as reflected on the Med list.  

## 2011-09-06 NOTE — Assessment & Plan Note (Signed)
Lorazepam helped

## 2011-09-06 NOTE — Assessment & Plan Note (Signed)
Chronic. 

## 2011-09-10 ENCOUNTER — Encounter: Payer: Self-pay | Admitting: Cardiology

## 2011-09-12 ENCOUNTER — Ambulatory Visit: Payer: Medicare Other | Admitting: Cardiology

## 2011-09-13 ENCOUNTER — Ambulatory Visit (INDEPENDENT_AMBULATORY_CARE_PROVIDER_SITE_OTHER): Payer: Medicare Other | Admitting: Cardiology

## 2011-09-13 ENCOUNTER — Encounter: Payer: Self-pay | Admitting: Cardiology

## 2011-09-13 VITALS — BP 122/62 | HR 66 | Ht 64.0 in | Wt 131.0 lb

## 2011-09-13 DIAGNOSIS — I779 Disorder of arteries and arterioles, unspecified: Secondary | ICD-10-CM

## 2011-09-13 DIAGNOSIS — I2510 Atherosclerotic heart disease of native coronary artery without angina pectoris: Secondary | ICD-10-CM

## 2011-09-13 DIAGNOSIS — R609 Edema, unspecified: Secondary | ICD-10-CM

## 2011-09-13 NOTE — Assessment & Plan Note (Signed)
Coronary disease is stable. No change in therapy. 

## 2011-09-13 NOTE — Assessment & Plan Note (Signed)
Her carotid disease is being followed. No change in therapy.

## 2011-09-13 NOTE — Patient Instructions (Signed)
Your physician wants you to follow-up in:  6 months. You will receive a reminder letter in the mail two months in advance. If you don't receive a letter, please call our office to schedule the follow-up appointment.   

## 2011-09-13 NOTE — Progress Notes (Signed)
HPI The patient is here for cardiology followup. Her cardiac status is stable. I have reviewed the primary care notes. She has a significant problem with depression. Her cardiac status has been stable. There is a very capable interpreter here with her.  Allergies  Allergen Reactions  . Baclofen     REACTION: confusion  . Belladonna   . Ciprofloxacin   . Doxycycline   . Erythromycin   . Gabapentin   . Iohexol      Code: RASH   . Metoclopramide     Clonic spasms  . Metronidazole     REACTION: rash  . Nalbuphine   . Nitrofurantoin     REACTION: nausea  . Penicillins   . Procaine Hcl   . Sucralfate     REACTION: nausea  . Sulfonamide Derivatives   . Methenamine Rash    Current Outpatient Prescriptions  Medication Sig Dispense Refill  . anastrozole (ARIMIDEX) 1 MG tablet TAKE 1 TABLET BY MOUTH EVERY DAY  90 tablet  3  . conjugated estrogens (PREMARIN) vaginal cream Place 0.5 Applicatorfuls vaginally 2 (two) times a week.  42.5 g  5  . diphenhydrAMINE (BENADRYL) 25 mg capsule Take 1 capsule (25 mg total) by mouth every 6 (six) hours as needed for itching.  60 capsule  3  . divalproex (DEPAKOTE) 250 MG DR tablet Take 1 tablet (250 mg total) by mouth 2 (two) times daily.  60 tablet  11  . indapamide (LOZOL) 2.5 MG tablet Take 1 tablet (2.5 mg total) by mouth every morning.  90 tablet  3  . KLOR-CON M10 10 MEQ tablet TAKE 1 TABLET BY MOUTH 2 TIMES A DAY  60 tablet  5  . Lactulose 20 GM/30ML SOLN Take 30-60 mLs by mouth as needed.        . loratadine (CLARITIN) 10 MG tablet Take 1 tablet (10 mg total) by mouth daily.  100 tablet  3  . LORazepam (ATIVAN) 1 MG tablet Take 1 tablet (1 mg total) by mouth every 8 (eight) hours as needed.  90 tablet  5  . methadone (DOLOPHINE) 10 MG tablet Take 1 tablet (10 mg total) by mouth 5 (five) times daily as needed. Fill on or after 09/25/11. Once every 30 days  150 tablet  0  . metoprolol succinate (TOPROL-XL) 25 MG 24 hr tablet TAKE 1 TABLET BY  MOUTH EVERY DAY  30 tablet  5  . mometasone (ELOCON) 0.1 % ointment Apply topically 2 (two) times daily. Apply to vaginal area  45 g  1  . omeprazole (PRILOSEC) 20 MG capsule Take 1 capsule (20 mg total) by mouth 2 (two) times daily.  60 capsule  11  . Pancrelipase, Lip-Prot-Amyl, (CREON) 24000 UNITS CPEP Take 1 capsule (24,000 Units total) by mouth 3 (three) times daily with meals.  90 capsule  5  . promethazine (PHENERGAN) 25 MG tablet TAKE 1 TO 2 TABLETS BY MOUTH 4 TIMES A DAY AS NEEDED FOR NAUSEA  60 tablet  5  . triamcinolone ointment (KENALOG) 0.1 % Apply topically 2 (two) times daily.  90 g  3  . trimethoprim (TRIMPEX) 100 MG tablet TAKE 1 TABLET BY MOUTH EVERY DAY  30 tablet  5  . Vitamin D, Ergocalciferol, (DRISDOL) 50000 UNITS CAPS Take 1 capsule (50,000 Units total) by mouth every 30 (thirty) days.  6 capsule  1  . zolpidem (AMBIEN) 10 MG tablet Take 1 tablet (10 mg total) by mouth at bedtime as needed.  90 tablet  1    History   Social History  . Marital Status: Married    Spouse Name: N/A    Number of Children: N/A  . Years of Education: N/A   Occupational History  . RETIRED MD    Social History Main Topics  . Smoking status: Never Smoker   . Smokeless tobacco: Not on file  . Alcohol Use: No  . Drug Use: No  . Sexually Active: No   Other Topics Concern  . Not on file   Social History Narrative   Patient is an immigrant from [place]. 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 Currently living w/her husband.     Family History  Problem Relation Age of Onset  . Coronary artery disease      Female 1st degree relative <60  . Colon cancer Mother   . Arthritis Mother   . Heart disease Mother   . Heart disease Father     Past Medical History  Diagnosis Date  . CAD (coronary artery disease)     Catheterization, January, 2007 patent grafts / hospitalization October 2 010 no MI  . Syncope 03/2009     ?orthostasis  . Allergic rhinitis   . History of colon polyps   . Diverticulosis of colon   . GERD (gastroesophageal reflux disease)   . Hyperlipidemia   . Abdominal  pain, other specified site     motility disorder, chorinic functional, severe (Dr Juanda Chance). Possible OCD w/complusive use of enemas and laxatives; psychotic features 2011  . Positive H. pylori test 2911  . Psychosomatic disorder     w/GI fixation  . Anxiety   . Depression   . Stress incontinence, female   . Vitamin d deficiency   . Vitamin B12 deficiency   . Renal insufficiency   . UTI (urinary tract infection)   . Hypokalemia     mild  . Insomnia   . Carotid artery disease     Doppler November, 1610, 0-39% LICA, 60-79% R. ICA.Marland Kitchen increased velocity... possibly from serpentine vessel  . Ejection fraction     50-55%, no wall motion abnormalities, echo, November, 2010  . Edema   . Hemothorax on right   . Breast cancer 2011    Dr Welton Flakes- right breast    Past Surgical History  Procedure Date  . Coronary artery bypass graft   . Myomectomy     Uterine and ovarian  . Breast fibroadenoma surgery   . Hemicolectomy 2005    Left  . Breast lumpectomy 2011  . Right vats (video-assisted thoracoscopic surgery) with     ROS Patient denies fever, chills, headache, sweats, rash, change in vision, change in hearing, chest pain, cough, nausea vomiting. She recently had some urinary symptoms that are being treated. All other systems are reviewed and are negative.  PHYSICAL EXAM   Patient is oriented to person time and place. Communication occurs through the interpreter. Lungs are clear. Respiratory effort is nonlabored. Cardiac exam reveals S1 and S2. There no clicks or significant murmurs. The abdomen is soft. There is no peripheral edema.  Filed Vitals:   09/13/11 1120  BP: 122/62  Pulse: 66  Height: 5\' 4"  (1.626 m)  Weight: 131 lb (59.421 kg)   EKG is done today and reviewed by me. There is sinus rhythm. There is no  significant change from the past.  ASSESSMENT & PLAN

## 2011-09-13 NOTE — Assessment & Plan Note (Signed)
There is no significant edema. No change in therapy.

## 2011-10-05 ENCOUNTER — Other Ambulatory Visit: Payer: Self-pay | Admitting: Internal Medicine

## 2011-10-16 ENCOUNTER — Ambulatory Visit (INDEPENDENT_AMBULATORY_CARE_PROVIDER_SITE_OTHER): Payer: Medicare Other | Admitting: Internal Medicine

## 2011-10-16 ENCOUNTER — Encounter: Payer: Self-pay | Admitting: Internal Medicine

## 2011-10-16 VITALS — BP 140/70 | HR 76 | Temp 98.0°F | Resp 16 | Wt 130.0 lb

## 2011-10-16 DIAGNOSIS — I959 Hypotension, unspecified: Secondary | ICD-10-CM

## 2011-10-16 DIAGNOSIS — N259 Disorder resulting from impaired renal tubular function, unspecified: Secondary | ICD-10-CM

## 2011-10-16 DIAGNOSIS — F411 Generalized anxiety disorder: Secondary | ICD-10-CM

## 2011-10-16 DIAGNOSIS — I2510 Atherosclerotic heart disease of native coronary artery without angina pectoris: Secondary | ICD-10-CM

## 2011-10-16 DIAGNOSIS — F329 Major depressive disorder, single episode, unspecified: Secondary | ICD-10-CM

## 2011-10-16 DIAGNOSIS — E538 Deficiency of other specified B group vitamins: Secondary | ICD-10-CM

## 2011-10-16 MED ORDER — METHADONE HCL 10 MG PO TABS: 10.0000 mg | ORAL_TABLET | Freq: Every day | ORAL | Status: DC | PRN

## 2011-10-16 MED ORDER — DIPHENHYDRAMINE HCL 25 MG PO CAPS: 25.0000 mg | ORAL_CAPSULE | Freq: Four times a day (QID) | ORAL | Status: DC | PRN

## 2011-10-16 MED ORDER — CLOTRIMAZOLE-BETAMETHASONE 1-0.05 % EX CREA: TOPICAL_CREAM | Freq: Two times a day (BID) | CUTANEOUS | Status: AC

## 2011-10-16 MED ORDER — DULOXETINE HCL 30 MG PO CPEP: 30.0000 mg | ORAL_CAPSULE | Freq: Every day | ORAL | Status: DC

## 2011-10-16 NOTE — Assessment & Plan Note (Signed)
Continue with current prescription therapy as reflected on the Med list.  

## 2011-10-16 NOTE — Progress Notes (Signed)
Subjective:    Patient ID: [name], female    DOB: 1927-07-08, 78 y.o.   MRN: [id number]  Dysuria  Associated symptoms include chills and frequency. Pertinent negatives include no nausea.  Urinary Frequency  Associated symptoms include chills and frequency. Pertinent negatives include no nausea.    C/o dysuria since 08/14/11 for bladder pain - she went to ER and given Methenamine - she developed a problem  w/vag rash and itching after the 2nd dose  C/o insomnia - current meds do not help F/u vaginitis - not better w/current creams  The patient is here to follow up on chronic paranoid depression, anxiety,  and chronic moderate rectal pain symptoms controlled with medicines, diet and exercise. She was seen in ER this am. C/o spasms in legs and arms at night; not sleeping  BP Readings from Last 3 Encounters:  10/16/11 140/70  09/13/11 122/62  09/06/11 128/70   Wt Readings from Last 3 Encounters:  10/16/11 130 lb (58.968 kg)  09/13/11 131 lb (59.421 kg)  09/06/11 133 lb (60.328 kg)     BP 140/70  Pulse 76  Temp(Src) 98 F (36.7 C) (Oral)  Resp 16  Wt 130 lb (58.968 kg)  Past Medical History  Diagnosis Date  . CAD (coronary artery disease)     Catheterization, January, 2007 patent grafts / hospitalization October 2 010 no MI  . Syncope 03/2009    ?orthostasis  . Allergic rhinitis   . History of colon polyps   . Diverticulosis of colon   . GERD (gastroesophageal reflux disease)   . Hyperlipidemia   . Abdominal  pain, other specified site     motility disorder, chorinic functional, severe (Dr Juanda Chance). Possible OCD w/complusive use of enemas and laxatives; psychotic features 2011  . Positive H. pylori test 2911  . Psychosomatic disorder     w/GI fixation  . Anxiety   . Depression   . Stress incontinence, female   . Vitamin d deficiency   . Vitamin B12 deficiency   . Renal insufficiency   . UTI (urinary tract infection)   . Hypokalemia     mild  .  Insomnia   . Carotid artery disease     Doppler November, 4098, 0-39% LICA, 60-79% R. ICA.Marland Kitchen increased velocity... possibly from serpentine vessel  . Ejection fraction     50-55%, no wall motion abnormalities, echo, November, 2010  . Edema   . Hemothorax on right   . Breast cancer 2011    Dr Welton Flakes- right breast   Past Surgical History  Procedure Date  . Coronary artery bypass graft   . Myomectomy     Uterine and ovarian  . Breast fibroadenoma surgery   . Hemicolectomy 2005    Left  . Breast lumpectomy 2011  . Right vats (video-assisted thoracoscopic surgery) with     reports that she has never smoked. She does not have any smokeless tobacco history on file. She reports that she does not drink alcohol or use illicit drugs. family history includes Arthritis in her mother; Colon cancer in her mother; Coronary artery disease in an unspecified family member; and Heart disease in her father and mother. Allergies  Allergen Reactions  . Baclofen     REACTION: confusion  . Belladonna   . Ciprofloxacin   . Doxycycline   . Erythromycin   . Gabapentin   . Iohexol      Code: RASH   . Metoclopramide     Clonic spasms  .  Metronidazole     REACTION: rash  . Nalbuphine   . Nitrofurantoin     REACTION: nausea  . Penicillins   . Procaine Hcl   . Sucralfate     REACTION: nausea  . Sulfonamide Derivatives   . Methenamine Rash   Current Outpatient Prescriptions on File Prior to Visit  Medication Sig Dispense Refill  . anastrozole (ARIMIDEX) 1 MG tablet TAKE 1 TABLET BY MOUTH EVERY DAY  90 tablet  3  . conjugated estrogens (PREMARIN) vaginal cream Place 0.5 Applicatorfuls vaginally 2 (two) times a week.  42.5 g  5  . diphenhydrAMINE (BENADRYL) 25 mg capsule Take 1 capsule (25 mg total) by mouth every 6 (six) hours as needed for itching.  60 capsule  3  . divalproex (DEPAKOTE) 250 MG DR tablet Take 1 tablet (250 mg total) by mouth 2 (two) times daily.  60 tablet  11  . indapamide (LOZOL)  2.5 MG tablet Take 1 tablet (2.5 mg total) by mouth every morning.  90 tablet  3  . KLOR-CON M10 10 MEQ tablet TAKE 1 TABLET BY MOUTH 2 TIMES A DAY  60 tablet  5  . Lactulose 20 GM/30ML SOLN Take 30-60 mLs by mouth as needed.        . loratadine (CLARITIN) 10 MG tablet Take 1 tablet (10 mg total) by mouth daily.  100 tablet  3  . LORazepam (ATIVAN) 1 MG tablet Take 1 tablet (1 mg total) by mouth every 8 (eight) hours as needed.  90 tablet  5  . methadone (DOLOPHINE) 10 MG tablet Take 1 tablet (10 mg total) by mouth 5 (five) times daily as needed. Fill on or after 09/25/11. Once every 30 days  150 tablet  0  . metoprolol succinate (TOPROL-XL) 25 MG 24 hr tablet TAKE 1 TABLET BY MOUTH EVERY DAY  30 tablet  5  . mometasone (ELOCON) 0.1 % ointment Apply topically 2 (two) times daily. Apply to vaginal area  45 g  1  . omeprazole (PRILOSEC) 20 MG capsule Take 1 capsule (20 mg total) by mouth 2 (two) times daily.  60 capsule  11  . Pancrelipase, Lip-Prot-Amyl, (CREON) 24000 UNITS CPEP Take 1 capsule (24,000 Units total) by mouth 3 (three) times daily with meals.  90 capsule  5  . promethazine (PHENERGAN) 25 MG tablet TAKE 1 TO 2 TABLETS BY MOUTH 4 TIMES A DAY AS NEEDED FOR NAUSEA  60 tablet  5  . triamcinolone ointment (KENALOG) 0.1 % Apply topically 2 (two) times daily.  90 g  3  . trimethoprim (TRIMPEX) 100 MG tablet TAKE 1 TABLET BY MOUTH EVERY DAY  30 tablet  5  . Vitamin D, Ergocalciferol, (DRISDOL) 50000 UNITS CAPS Take 1 capsule (50,000 Units total) by mouth every 30 (thirty) days.  6 capsule  1  . zolpidem (AMBIEN) 10 MG tablet Take 1 tablet (10 mg total) by mouth at bedtime as needed.  90 tablet  1      Review of Systems  Constitutional: Positive for chills and fatigue. Negative for activity change, appetite change and unexpected weight change.  HENT: Negative for congestion, mouth sores and sinus pressure.   Eyes: Negative for visual disturbance.  Respiratory: Negative for cough and chest  tightness.   Gastrointestinal: Positive for constipation. Negative for nausea, abdominal pain, diarrhea and blood in stool.  Genitourinary: Positive for dysuria and frequency. Negative for difficulty urinating and vaginal pain.  Musculoskeletal: Negative for back pain and gait problem.  Skin: Negative for pallor and rash.  Neurological: Negative for dizziness, tremors, weakness, numbness and headaches.  Psychiatric/Behavioral: Positive for behavioral problems, sleep disturbance, dysphoric mood and decreased concentration. Negative for suicidal ideas, hallucinations, confusion and agitation. The patient is nervous/anxious.        Objective:   Physical Exam  Constitutional: She appears well-developed. No distress.       Chron ill appearing  HENT:  Head: Normocephalic.  Right Ear: External ear normal.  Left Ear: External ear normal.  Nose: Nose normal.  Mouth/Throat: Oropharynx is clear and moist.  Eyes: Conjunctivae are normal. Pupils are equal, round, and reactive to light. Right eye exhibits no discharge. Left eye exhibits no discharge.  Neck: Normal range of motion. Neck supple. No JVD present. No tracheal deviation present. No thyromegaly present.  Cardiovascular: Normal rate, regular rhythm and normal heart sounds.   Pulmonary/Chest: No stridor. No respiratory distress. She has no wheezes.  Abdominal: Soft. Bowel sounds are normal. She exhibits no distension and no mass. There is tenderness (mild). There is no rebound and no guarding.  Musculoskeletal: She exhibits no edema and no tenderness.  Lymphadenopathy:    She has no cervical adenopathy.  Neurological: She displays normal reflexes. No cranial nerve deficit. She exhibits normal muscle tone. Coordination normal.  Skin: No rash noted. No erythema.  Psychiatric: Her behavior is normal. Thought content normal.       depressed   Erythema and dryness of the vagina  Lab Results  Component Value Date   WBC 7.0 08/21/2011   HGB  11.6* 08/21/2011   HCT 33.5* 08/21/2011   PLT 174 08/21/2011   GLUCOSE 103* 08/21/2011   CHOL 302* 10/07/2009   TRIG 274.0* 10/07/2009   HDL 51.50 10/07/2009   LDLDIRECT 195.8 10/07/2009   LDLCALC  Value: 280        Total Cholesterol/HDL:CHD Risk Coronary Heart Disease Risk Table                     Men   Women  1/2 Average Risk   3.4   3.3  Average Risk       5.0   4.4  2 X Average Risk   9.6   7.1  3 X Average Risk  23.4   11.0        Use the calculated Patient Ratio above and the CHD Risk Table to determine the patient's CHD Risk.        ATP III CLASSIFICATION (LDL):  <100     mg/dL   Optimal  086-578  mg/dL   Near or Above                    Optimal  130-159  mg/dL   Borderline  469-629  mg/dL   High  >528     mg/dL   Very High* 41/07/2438   ALT 18 08/14/2011   AST 20 08/14/2011   NA 130* 08/21/2011   K 4.3 08/21/2011   CL 95* 08/21/2011   CREATININE 0.99 08/21/2011   BUN 17 08/21/2011   CO2 24 08/21/2011   TSH 1.82 08/14/2011   INR 1.02 12/25/2010         Assessment & Plan:

## 2011-10-16 NOTE — Assessment & Plan Note (Signed)
We will try Cymbalta. Risks discussed

## 2011-10-16 NOTE — Assessment & Plan Note (Signed)
Monitoring

## 2011-10-16 NOTE — Assessment & Plan Note (Addendum)
Will try Lotrisone. May need Premarin

## 2011-11-27 ENCOUNTER — Other Ambulatory Visit: Payer: Self-pay | Admitting: Internal Medicine

## 2011-12-04 ENCOUNTER — Other Ambulatory Visit: Payer: Self-pay | Admitting: Internal Medicine

## 2011-12-06 ENCOUNTER — Other Ambulatory Visit: Payer: Self-pay | Admitting: Internal Medicine

## 2011-12-12 ENCOUNTER — Other Ambulatory Visit: Payer: Self-pay | Admitting: Internal Medicine

## 2011-12-24 ENCOUNTER — Ambulatory Visit (INDEPENDENT_AMBULATORY_CARE_PROVIDER_SITE_OTHER): Payer: Medicare Other | Admitting: Internal Medicine

## 2011-12-24 ENCOUNTER — Encounter: Payer: Self-pay | Admitting: Internal Medicine

## 2011-12-24 VITALS — BP 140/84 | HR 80 | Temp 98.7°F | Resp 16 | Wt 124.0 lb

## 2011-12-24 DIAGNOSIS — E538 Deficiency of other specified B group vitamins: Secondary | ICD-10-CM

## 2011-12-24 DIAGNOSIS — I779 Disorder of arteries and arterioles, unspecified: Secondary | ICD-10-CM

## 2011-12-24 DIAGNOSIS — I2510 Atherosclerotic heart disease of native coronary artery without angina pectoris: Secondary | ICD-10-CM

## 2011-12-24 DIAGNOSIS — F329 Major depressive disorder, single episode, unspecified: Secondary | ICD-10-CM

## 2011-12-24 DIAGNOSIS — R109 Unspecified abdominal pain: Secondary | ICD-10-CM

## 2011-12-24 DIAGNOSIS — R55 Syncope and collapse: Secondary | ICD-10-CM

## 2011-12-24 DIAGNOSIS — N259 Disorder resulting from impaired renal tubular function, unspecified: Secondary | ICD-10-CM

## 2011-12-24 DIAGNOSIS — F411 Generalized anxiety disorder: Secondary | ICD-10-CM

## 2011-12-24 MED ORDER — METHADONE HCL 10 MG PO TABS: 10.0000 mg | ORAL_TABLET | Freq: Every day | ORAL | Status: DC | PRN

## 2011-12-24 MED ORDER — METOPROLOL SUCCINATE ER 25 MG PO TB24: 25.0000 mg | ORAL_TABLET | Freq: Every day | ORAL | Status: DC

## 2011-12-24 MED ORDER — TAMSULOSIN HCL 0.4 MG PO CAPS: 0.4000 mg | ORAL_CAPSULE | Freq: Every day | ORAL | Status: DC

## 2011-12-24 MED ORDER — TRIMETHOPRIM 100 MG PO TABS: 100.0000 mg | ORAL_TABLET | Freq: Every day | ORAL | Status: DC

## 2011-12-24 MED ORDER — LORAZEPAM 1 MG PO TABS: 1.0000 mg | ORAL_TABLET | Freq: Three times a day (TID) | ORAL | Status: DC | PRN

## 2011-12-24 NOTE — Assessment & Plan Note (Signed)
Continue with current prescription therapy as reflected on the Med list.  

## 2011-12-24 NOTE — Progress Notes (Signed)
Subjective:    Patient ID: [name], female    DOB: April 29, 1928, 78 y.o.   MRN: [id number]  HPI  C/o dysuria since 08/14/11 for bladder pain - she went to ER and given Methenamine - she developed a problem  w/vag rash and itching after the 2nd dose - resolving C/o insomnia - current meds do not help   The patient is here to follow up on chronic paranoid depression, anxiety,  and chronic moderate rectal pain symptoms controlled with medicines, diet and exercise. She was seen in ER this am. C/o spasms in legs and arms at night; not sleeping  BP Readings from Last 3 Encounters:  12/24/11 140/84  10/16/11 140/70  09/13/11 122/62   Wt Readings from Last 3 Encounters:  12/24/11 124 lb (56.246 kg)  10/16/11 130 lb (58.968 kg)  09/13/11 131 lb (59.421 kg)     BP 140/84  Pulse 80  Temp 98.7 F (37.1 C) (Oral)  Resp 16  Wt 124 lb (56.246 kg)  Past Medical History  Diagnosis Date  . CAD (coronary artery disease)     Catheterization, January, 2007 patent grafts / hospitalization October 2 010 no MI  . Syncope 03/2009    ?orthostasis  . Allergic rhinitis   . History of colon polyps   . Diverticulosis of colon   . GERD (gastroesophageal reflux disease)   . Hyperlipidemia   . Abdominal  pain, other specified site     motility disorder, chorinic functional, severe (Dr Juanda Chance). Possible OCD w/complusive use of enemas and laxatives; psychotic features 2011  . Positive H. pylori test 2911  . Psychosomatic disorder     w/GI fixation  . Anxiety   . Depression   . Stress incontinence, female   . Vitamin d deficiency   . Vitamin B12 deficiency   . Renal insufficiency   . UTI (urinary tract infection)   . Hypokalemia     mild  . Insomnia   . Carotid artery disease     Doppler November, 4098, 0-39% LICA, 60-79% R. ICA.Marland Kitchen increased velocity... possibly from serpentine vessel  . Ejection fraction     50-55%, no wall motion abnormalities, echo, November, 2010  . Edema   .  Hemothorax on right   . Breast cancer 2011    Dr Welton Flakes- right breast   Past Surgical History  Procedure Date  . Coronary artery bypass graft   . Myomectomy     Uterine and ovarian  . Breast fibroadenoma surgery   . Hemicolectomy 2005    Left  . Breast lumpectomy 2011  . Right vats (video-assisted thoracoscopic surgery) with     reports that she has never smoked. She does not have any smokeless tobacco history on file. She reports that she does not drink alcohol or use illicit drugs. family history includes Arthritis in her mother; Colon cancer in her mother; Coronary artery disease in an unspecified family member; and Heart disease in her father and mother. Allergies  Allergen Reactions  . Baclofen     REACTION: confusion  . Belladonna   . Ciprofloxacin   . Doxycycline   . Erythromycin   . Gabapentin   . Iohexol      Code: RASH   . Metoclopramide     Clonic spasms  . Metronidazole     REACTION: rash  . Nalbuphine   . Nitrofurantoin     REACTION: nausea  . Penicillins   . Procaine Hcl   . Sucralfate  REACTION: nausea  . Sulfonamide Derivatives   . Methenamine Rash   Current Outpatient Prescriptions on File Prior to Visit  Medication Sig Dispense Refill  . anastrozole (ARIMIDEX) 1 MG tablet TAKE 1 TABLET BY MOUTH EVERY DAY  90 tablet  3  . clotrimazole-betamethasone (LOTRISONE) cream Apply topically 2 (two) times daily.  90 g  3  . conjugated estrogens (PREMARIN) vaginal cream Place 0.5 Applicatorfuls vaginally 2 (two) times a week.  42.5 g  5  . diphenhydrAMINE (BENADRYL) 25 mg capsule Take 1 capsule (25 mg total) by mouth every 6 (six) hours as needed for itching.  60 capsule  3  . diphenhydrAMINE (BENADRYL) 25 mg capsule TAKE ONE CAPSULE BY MOUTH EVERY 6 HOURS AS NEEDED ITCHING  60 capsule  1  . divalproex (DEPAKOTE) 250 MG DR tablet Take 1 tablet (250 mg total) by mouth 2 (two) times daily.  60 tablet  11  . DULoxetine (CYMBALTA) 30 MG capsule Take 1 capsule (30  mg total) by mouth daily.  30 capsule  2  . indapamide (LOZOL) 2.5 MG tablet Take 1 tablet (2.5 mg total) by mouth every morning.  90 tablet  3  . KLOR-CON M10 10 MEQ tablet TAKE 1 TABLET BY MOUTH 2 TIMES A DAY  60 tablet  5  . Lactulose 20 GM/30ML SOLN Take 30-60 mLs by mouth as needed.        . loratadine (CLARITIN) 10 MG tablet Take 1 tablet (10 mg total) by mouth daily.  100 tablet  3  . LORazepam (ATIVAN) 1 MG tablet Take 1 tablet (1 mg total) by mouth every 8 (eight) hours as needed.  90 tablet  5  . methadone (DOLOPHINE) 10 MG tablet Take 1 tablet (10 mg total) by mouth 5 (five) times daily as needed. Fill on or after 11/25/11. Once every 30 days  150 tablet  0  . metoCLOPramide (REGLAN) 5 MG tablet TAKE 1 TABLET BY MOUTH 3 TIMES A DAY BEFORE MEALS  90 tablet  1  . metoprolol succinate (TOPROL-XL) 25 MG 24 hr tablet TAKE 1 TABLET BY MOUTH EVERY DAY  30 tablet  5  . mometasone (ELOCON) 0.1 % ointment Apply topically 2 (two) times daily. Apply to vaginal area  45 g  1  . omeprazole (PRILOSEC) 20 MG capsule Take 1 capsule (20 mg total) by mouth 2 (two) times daily.  60 capsule  11  . Pancrelipase, Lip-Prot-Amyl, (CREON) 24000 UNITS CPEP Take 1 capsule (24,000 Units total) by mouth 3 (three) times daily with meals.  90 capsule  5  . promethazine (PHENERGAN) 25 MG tablet TAKE 1 TO 2 TABLETS BY MOUTH 4 TIMES A DAY AS NEEDED FOR NAUSEA  60 tablet  5  . trimethoprim (TRIMPEX) 100 MG tablet TAKE 1 TABLET BY MOUTH EVERY DAY  30 tablet  5  . Vitamin D, Ergocalciferol, (DRISDOL) 50000 UNITS CAPS Take 1 capsule (50,000 Units total) by mouth every 30 (thirty) days.  6 capsule  1  . zolpidem (AMBIEN) 10 MG tablet Take 1 tablet (10 mg total) by mouth at bedtime as needed.  90 tablet  1      Review of Systems  Constitutional: Positive for fatigue. Negative for activity change, appetite change and unexpected weight change.  HENT: Negative for congestion, mouth sores and sinus pressure.   Eyes: Negative  for visual disturbance.  Respiratory: Negative for cough and chest tightness.   Gastrointestinal: Positive for constipation. Negative for abdominal pain, diarrhea and blood  in stool.  Genitourinary: Negative for difficulty urinating and vaginal pain.  Musculoskeletal: Negative for back pain and gait problem.  Skin: Negative for pallor and rash.  Neurological: Negative for dizziness, tremors, weakness, numbness and headaches.  Psychiatric/Behavioral: Positive for behavioral problems, disturbed wake/sleep cycle, dysphoric mood and decreased concentration. Negative for suicidal ideas, hallucinations, confusion and agitation. The patient is nervous/anxious.        Objective:   Physical Exam  Constitutional: She appears well-developed. No distress.       Chron ill appearing  HENT:  Head: Normocephalic.  Right Ear: External ear normal.  Left Ear: External ear normal.  Nose: Nose normal.  Mouth/Throat: Oropharynx is clear and moist.  Eyes: Conjunctivae are normal. Pupils are equal, round, and reactive to light. Right eye exhibits no discharge. Left eye exhibits no discharge.  Neck: Normal range of motion. Neck supple. No JVD present. No tracheal deviation present. No thyromegaly present.  Cardiovascular: Normal rate, regular rhythm and normal heart sounds.   Pulmonary/Chest: No stridor. No respiratory distress. She has no wheezes.  Abdominal: Soft. Bowel sounds are normal. She exhibits no distension and no mass. There is tenderness (mild). There is no rebound and no guarding.  Musculoskeletal: She exhibits no edema and no tenderness.  Lymphadenopathy:    She has no cervical adenopathy.  Neurological: She displays normal reflexes. No cranial nerve deficit. She exhibits normal muscle tone. Coordination normal.  Skin: No rash noted. No erythema.  Psychiatric: Her behavior is normal. Thought content normal.       depressed   Erythema and dryness of the vagina much better  Lab Results    Component Value Date   WBC 7.0 08/21/2011   HGB 11.6* 08/21/2011   HCT 33.5* 08/21/2011   PLT 174 08/21/2011   GLUCOSE 103* 08/21/2011   CHOL 302* 10/07/2009   TRIG 274.0* 10/07/2009   HDL 51.50 10/07/2009   LDLDIRECT 195.8 10/07/2009   LDLCALC  Value: 280        Total Cholesterol/HDL:CHD Risk Coronary Heart Disease Risk Table                     Men   Women  1/2 Average Risk   3.4   3.3  Average Risk       5.0   4.4  2 X Average Risk   9.6   7.1  3 X Average Risk  23.4   11.0        Use the calculated Patient Ratio above and the CHD Risk Table to determine the patient's CHD Risk.        ATP III CLASSIFICATION (LDL):  <100     mg/dL   Optimal  161-096  mg/dL   Near or Above                    Optimal  130-159  mg/dL   Borderline  045-409  mg/dL   High  >811     mg/dL   Very High* 91/08/7827   ALT 18 08/14/2011   AST 20 08/14/2011   NA 130* 08/21/2011   K 4.3 08/21/2011   CL 95* 08/21/2011   CREATININE 0.99 08/21/2011   BUN 17 08/21/2011   CO2 24 08/21/2011   TSH 1.82 08/14/2011   INR 1.02 12/25/2010         Assessment & Plan:

## 2011-12-27 ENCOUNTER — Telehealth: Payer: Self-pay | Admitting: Internal Medicine

## 2011-12-27 DIAGNOSIS — Z0279 Encounter for issue of other medical certificate: Secondary | ICD-10-CM

## 2011-12-27 NOTE — Telephone Encounter (Signed)
Came in checking on paperwork, request paperwork to be complete by 01/04/12. Request call once complete for pick up

## 2011-12-28 NOTE — Telephone Encounter (Signed)
Done. Thx.

## 2011-12-28 NOTE — Telephone Encounter (Signed)
Forms upfront/Pt's daughter informed.

## 2012-01-18 ENCOUNTER — Other Ambulatory Visit: Payer: Self-pay | Admitting: Internal Medicine

## 2012-01-23 ENCOUNTER — Other Ambulatory Visit: Payer: Self-pay | Admitting: Internal Medicine

## 2012-01-27 ENCOUNTER — Telehealth: Payer: Self-pay | Admitting: Internal Medicine

## 2012-01-28 DIAGNOSIS — Z0279 Encounter for issue of other medical certificate: Secondary | ICD-10-CM

## 2012-02-07 NOTE — Telephone Encounter (Signed)
x

## 2012-02-08 ENCOUNTER — Telehealth: Payer: Self-pay | Admitting: Internal Medicine

## 2012-02-08 MED ORDER — DIPHENHYDRAMINE HCL 25 MG PO CAPS: 25.0000 mg | ORAL_CAPSULE | Freq: Four times a day (QID) | ORAL | Status: DC | PRN

## 2012-02-08 MED ORDER — PROMETHAZINE HCL 25 MG PO TABS: 25.0000 mg | ORAL_TABLET | Freq: Three times a day (TID) | ORAL | Status: DC | PRN

## 2012-02-08 NOTE — Telephone Encounter (Signed)
Med ref

## 2012-02-14 ENCOUNTER — Other Ambulatory Visit: Payer: Self-pay | Admitting: Internal Medicine

## 2012-02-15 NOTE — Telephone Encounter (Signed)
Ok to Rf? 

## 2012-02-29 ENCOUNTER — Ambulatory Visit (INDEPENDENT_AMBULATORY_CARE_PROVIDER_SITE_OTHER): Payer: Medicare Other | Admitting: Internal Medicine

## 2012-02-29 ENCOUNTER — Other Ambulatory Visit (INDEPENDENT_AMBULATORY_CARE_PROVIDER_SITE_OTHER): Payer: Medicare Other

## 2012-02-29 ENCOUNTER — Encounter: Payer: Self-pay | Admitting: Internal Medicine

## 2012-02-29 VITALS — BP 128/78 | HR 84 | Temp 96.7°F | Resp 16 | Wt 127.0 lb

## 2012-02-29 DIAGNOSIS — I959 Hypotension, unspecified: Secondary | ICD-10-CM

## 2012-02-29 DIAGNOSIS — F411 Generalized anxiety disorder: Secondary | ICD-10-CM

## 2012-02-29 DIAGNOSIS — N259 Disorder resulting from impaired renal tubular function, unspecified: Secondary | ICD-10-CM

## 2012-02-29 DIAGNOSIS — E538 Deficiency of other specified B group vitamins: Secondary | ICD-10-CM

## 2012-02-29 DIAGNOSIS — G894 Chronic pain syndrome: Secondary | ICD-10-CM

## 2012-02-29 DIAGNOSIS — E785 Hyperlipidemia, unspecified: Secondary | ICD-10-CM

## 2012-02-29 DIAGNOSIS — F429 Obsessive-compulsive disorder, unspecified: Secondary | ICD-10-CM

## 2012-02-29 DIAGNOSIS — I2510 Atherosclerotic heart disease of native coronary artery without angina pectoris: Secondary | ICD-10-CM

## 2012-02-29 DIAGNOSIS — Z853 Personal history of malignant neoplasm of breast: Secondary | ICD-10-CM

## 2012-02-29 LAB — CBC WITH DIFFERENTIAL/PLATELET
Basophils Absolute: 0 10*3/uL (ref 0.0–0.1)
Hemoglobin: 11 g/dL — ABNORMAL LOW (ref 12.0–15.0)
Lymphocytes Relative: 19.6 % (ref 12.0–46.0)
Monocytes Relative: 10.3 % (ref 3.0–12.0)
Neutro Abs: 3.5 10*3/uL (ref 1.4–7.7)
RDW: 13.2 % (ref 11.5–14.6)

## 2012-02-29 LAB — BASIC METABOLIC PANEL
BUN: 23 mg/dL (ref 6–23)
CO2: 29 mEq/L (ref 19–32)
Calcium: 8.8 mg/dL (ref 8.4–10.5)
Creatinine, Ser: 1.1 mg/dL (ref 0.4–1.2)
Glucose, Bld: 75 mg/dL (ref 70–99)

## 2012-02-29 LAB — TSH: TSH: 3.09 u[IU]/mL (ref 0.35–5.50)

## 2012-02-29 LAB — VITAMIN B12: Vitamin B-12: 306 pg/mL (ref 211–911)

## 2012-02-29 MED ORDER — METHADONE HCL 10 MG PO TABS: 10.0000 mg | ORAL_TABLET | Freq: Every day | ORAL | Status: DC | PRN

## 2012-02-29 NOTE — Assessment & Plan Note (Signed)
Better  

## 2012-02-29 NOTE — Assessment & Plan Note (Signed)
Obsessive use of enemas - s/p multiple unsuccessful attempts to treat. She has declined psychiatric help multiple times. Poor prognosis: enema overuse leads to abd pain and a chronic use of methadone 

## 2012-02-29 NOTE — Progress Notes (Signed)
Subjective:    Patient ID: [name], female    DOB: 1927/06/25, 78 y.o.   MRN: [id number]  HPI   F/u insomnia - current meds do not help   The patient is here to follow up on chronic paranoid depression, anxiety,  and chronic moderate rectal pain symptoms controlled with medicines some. C/o chronic abd pain. C/o spasms in legs and arms at night; not sleeping  BP Readings from Last 3 Encounters:  02/29/12 128/78  12/24/11 140/84  10/16/11 140/70   Wt Readings from Last 3 Encounters:  02/29/12 127 lb (57.607 kg)  12/24/11 124 lb (56.246 kg)  10/16/11 130 lb (58.968 kg)     BP 128/78  Pulse 84  Temp 96.7 F (35.9 C) (Oral)  Resp 16  Wt 127 lb (57.607 kg)  Past Medical History  Diagnosis Date  . CAD (coronary artery disease)     Catheterization, January, 2007 patent grafts / hospitalization October 2 010 no MI  . Syncope 03/2009    ?orthostasis  . Allergic rhinitis   . History of colon polyps   . Diverticulosis of colon   . GERD (gastroesophageal reflux disease)   . Hyperlipidemia   . Abdominal  pain, other specified site     motility disorder, chorinic functional, severe (Dr Juanda Chance). Possible OCD w/complusive use of enemas and laxatives; psychotic features 2011  . Positive H. pylori test 2911  . Psychosomatic disorder     w/GI fixation  . Anxiety   . Depression   . Stress incontinence, female   . Vitamin D deficiency   . Vitamin B12 deficiency   . Renal insufficiency   . UTI (urinary tract infection)   . Hypokalemia     mild  . Insomnia   . Carotid artery disease     Doppler November, 3086, 0-39% LICA, 60-79% R. ICA.Marland Kitchen increased velocity... possibly from serpentine vessel  . Ejection fraction     50-55%, no wall motion abnormalities, echo, November, 2010  . Edema   . Hemothorax on right   . Breast cancer 2011    Dr Welton Flakes- right breast   Past Surgical History  Procedure Date  . Coronary artery bypass graft   . Myomectomy     Uterine and  ovarian  . Breast fibroadenoma surgery   . Hemicolectomy 2005    Left  . Breast lumpectomy 2011  . Right vats (video-assisted thoracoscopic surgery) with     reports that she has never smoked. She does not have any smokeless tobacco history on file. She reports that she does not drink alcohol or use illicit drugs. family history includes Arthritis in her mother; Colon cancer in her mother; Coronary artery disease in an unspecified family member; and Heart disease in her father and mother. Allergies  Allergen Reactions  . Baclofen     REACTION: confusion  . Belladonna   . Ciprofloxacin   . Doxycycline   . Erythromycin   . Gabapentin   . Iohexol      Code: RASH   . Metoclopramide     Clonic spasms  . Metronidazole     REACTION: rash  . Nalbuphine   . Nitrofurantoin     REACTION: nausea  . Penicillins   . Procaine Hcl   . Sucralfate     REACTION: nausea  . Sulfonamide Derivatives   . Methenamine Rash   Current Outpatient Prescriptions on File Prior to Visit  Medication Sig Dispense Refill  . anastrozole (ARIMIDEX) 1 MG  tablet TAKE 1 TABLET BY MOUTH EVERY DAY  90 tablet  3  . clotrimazole-betamethasone (LOTRISONE) cream Apply topically 2 (two) times daily.  90 g  3  . conjugated estrogens (PREMARIN) vaginal cream Place 0.5 Applicatorfuls vaginally 2 (two) times a week.  42.5 g  5  . [employer] ALLERGY 25 MG capsule TAKE ONE CAPSULE BY MOUTH EVERY 6 HOURS AS NEEDED ITCHING  60 capsule  1  . CYMBALTA 30 MG capsule TAKE 1 CAPSULE BY MOUTH DAILY  30 each  2  . diphenhydrAMINE (BENADRYL) 25 mg capsule Take 1 capsule (25 mg total) by mouth every 6 (six) hours as needed for itching.  60 capsule  5  . divalproex (DEPAKOTE) 250 MG DR tablet Take 1 tablet (250 mg total) by mouth 2 (two) times daily.  60 tablet  11  . indapamide (LOZOL) 2.5 MG tablet Take 1 tablet (2.5 mg total) by mouth every morning.  90 tablet  3  . KLOR-CON M10 10 MEQ tablet TAKE 1 TABLET BY MOUTH 2 TIMES A DAY  60 tablet   5  . Lactulose 20 GM/30ML SOLN Take 30-60 mLs by mouth as needed.        . loratadine (CLARITIN) 10 MG tablet Take 1 tablet (10 mg total) by mouth daily.  100 tablet  3  . LORazepam (ATIVAN) 1 MG tablet TAKE 1 TABLET BY MOUTH EVERY 8 HOURS AS NEEDED  90 tablet  4  . methadone (DOLOPHINE) 10 MG tablet Take 1 tablet (10 mg total) by mouth 5 (five) times daily as needed. Fill on or after 01/26/12. Once every 30 days  150 tablet  0  . metoCLOPramide (REGLAN) 5 MG tablet TAKE 1 TABLET BY MOUTH 3 TIMES A DAY BEFORE MEALS  90 tablet  1  . metoprolol succinate (TOPROL-XL) 25 MG 24 hr tablet Take 1 tablet (25 mg total) by mouth daily.  90 tablet  3  . mometasone (ELOCON) 0.1 % ointment Apply topically 2 (two) times daily. Apply to vaginal area  45 g  1  . omeprazole (PRILOSEC) 20 MG capsule Take 1 capsule (20 mg total) by mouth 2 (two) times daily.  60 capsule  11  . Pancrelipase, Lip-Prot-Amyl, (CREON) 24000 UNITS CPEP Take 1 capsule (24,000 Units total) by mouth 3 (three) times daily with meals.  90 capsule  5  . promethazine (PHENERGAN) 25 MG tablet Take 1 tablet (25 mg total) by mouth every 8 (eight) hours as needed for nausea.  60 tablet  5  . Tamsulosin HCl (FLOMAX) 0.4 MG CAPS Take 1 capsule (0.4 mg total) by mouth daily.  90 capsule  3  . trimethoprim (TRIMPEX) 100 MG tablet Take 1 tablet (100 mg total) by mouth daily.  90 tablet  3  . trimethoprim (TRIMPEX) 100 MG tablet TAKE 1 TABLET BY MOUTH EVERY DAY  30 tablet  5  . Vitamin D, Ergocalciferol, (DRISDOL) 50000 UNITS CAPS Take 1 capsule (50,000 Units total) by mouth every 30 (thirty) days.  6 capsule  1  . zolpidem (AMBIEN) 10 MG tablet Take 1 tablet (10 mg total) by mouth at bedtime as needed.  90 tablet  1      Review of Systems  Constitutional: Positive for fatigue. Negative for activity change, appetite change and unexpected weight change.  HENT: Negative for congestion, mouth sores and sinus pressure.   Eyes: Negative for visual  disturbance.  Respiratory: Negative for cough and chest tightness.   Gastrointestinal: Positive for constipation. Negative  for abdominal pain, diarrhea and blood in stool.  Genitourinary: Negative for difficulty urinating and vaginal pain.  Musculoskeletal: Negative for back pain and gait problem.  Skin: Negative for pallor and rash.  Neurological: Negative for dizziness, tremors, weakness, numbness and headaches.  Psychiatric/Behavioral: Positive for behavioral problems, disturbed wake/sleep cycle, dysphoric mood and decreased concentration. Negative for suicidal ideas, hallucinations, confusion and agitation. The patient is nervous/anxious.        Objective:   Physical Exam  Constitutional: She appears well-developed. No distress.       Chron ill appearing  HENT:  Head: Normocephalic.  Right Ear: External ear normal.  Left Ear: External ear normal.  Nose: Nose normal.  Mouth/Throat: Oropharynx is clear and moist.  Eyes: Conjunctivae normal are normal. Pupils are equal, round, and reactive to light. Right eye exhibits no discharge. Left eye exhibits no discharge.  Neck: Normal range of motion. Neck supple. No JVD present. No tracheal deviation present. No thyromegaly present.  Cardiovascular: Normal rate, regular rhythm and normal heart sounds.   Pulmonary/Chest: No stridor. No respiratory distress. She has no wheezes.  Abdominal: Soft. Bowel sounds are normal. She exhibits no distension and no mass. There is tenderness (mild). There is no rebound and no guarding.  Musculoskeletal: She exhibits no edema and no tenderness.  Lymphadenopathy:    She has no cervical adenopathy.  Neurological: She displays normal reflexes. No cranial nerve deficit. She exhibits normal muscle tone. Coordination normal.  Skin: No rash noted. No erythema.  Psychiatric: Her behavior is normal. Thought content normal.       depressed     Lab Results  Component Value Date   WBC 7.0 08/21/2011   HGB 11.6*  08/21/2011   HCT 33.5* 08/21/2011   PLT 174 08/21/2011   GLUCOSE 103* 08/21/2011   CHOL 302* 10/07/2009   TRIG 274.0* 10/07/2009   HDL 51.50 10/07/2009   LDLDIRECT 195.8 10/07/2009   LDLCALC  Value: 280        Total Cholesterol/HDL:CHD Risk Coronary Heart Disease Risk Table                     Men   Women  1/2 Average Risk   3.4   3.3  Average Risk       5.0   4.4  2 X Average Risk   9.6   7.1  3 X Average Risk  23.4   11.0        Use the calculated Patient Ratio above and the CHD Risk Table to determine the patient's CHD Risk.        ATP III CLASSIFICATION (LDL):  <100     mg/dL   Optimal  161-096  mg/dL   Near or Above                    Optimal  130-159  mg/dL   Borderline  045-409  mg/dL   High  >811     mg/dL   Very High* 91/08/7827   ALT 18 08/14/2011   AST 20 08/14/2011   NA 130* 08/21/2011   K 4.3 08/21/2011   CL 95* 08/21/2011   CREATININE 0.99 08/21/2011   BUN 17 08/21/2011   CO2 24 08/21/2011   TSH 1.82 08/14/2011   INR 1.02 12/25/2010         Assessment & Plan:

## 2012-02-29 NOTE — Assessment & Plan Note (Signed)
Continue with current prescription therapy as reflected on the Med list.  

## 2012-02-29 NOTE — Assessment & Plan Note (Signed)
Statin intolerant 

## 2012-02-29 NOTE — Assessment & Plan Note (Signed)
Obsessive use of enemas - s/p multiple unsuccessful attempts to treat. She has declined psychiatric help multiple times. Poor prognosis: enema overuse leads to abd pain and a chronic use of methadone

## 2012-02-29 NOTE — Assessment & Plan Note (Signed)
Monitoring

## 2012-02-29 NOTE — Patient Instructions (Signed)
Wt Readings from Last 3 Encounters:  02/29/12 127 lb (57.607 kg)  12/24/11 124 lb (56.246 kg)  10/16/11 130 lb (58.968 kg)

## 2012-03-03 ENCOUNTER — Ambulatory Visit (HOSPITAL_BASED_OUTPATIENT_CLINIC_OR_DEPARTMENT_OTHER): Payer: Medicare Other | Admitting: Oncology

## 2012-03-03 ENCOUNTER — Telehealth: Payer: Self-pay | Admitting: *Deleted

## 2012-03-03 ENCOUNTER — Other Ambulatory Visit: Payer: Medicare Other | Admitting: Lab

## 2012-03-03 VITALS — BP 137/76 | HR 74 | Temp 98.5°F | Resp 20 | Ht 64.0 in | Wt 128.7 lb

## 2012-03-03 DIAGNOSIS — Z170 Estrogen receptor positive status [ER+]: Secondary | ICD-10-CM

## 2012-03-03 DIAGNOSIS — Z853 Personal history of malignant neoplasm of breast: Secondary | ICD-10-CM

## 2012-03-03 DIAGNOSIS — C50219 Malignant neoplasm of upper-inner quadrant of unspecified female breast: Secondary | ICD-10-CM

## 2012-03-03 LAB — ALLERGEN FOOD PROFILE SPECIFIC IGE
Chicken IgE: <0.1 kU/L
Egg White IgE: <0.1 kU/L
Orange: <0.1 kU/L
Peanut IgE: <0.1 kU/L
Soybean IgE: <0.1 kU/L
Tomato IgE: <0.1 kU/L

## 2012-03-03 NOTE — Progress Notes (Signed)
OFFICE PROGRESS NOTE  CC  Sandy Primes, MD [address] [facility] [address] [place] [address] Dr. 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  #2 patient was then begun on Arimidex 1 mg daily starting in January 2012.  CURRENT THERAPY:Arimidex 1 mg daily  INTERVAL HISTORY: Senna Lape Klaus 78 y.o. female returns for followup visit at 6 months. Clinically she is doing well she has been very compliant with her therapy. She is taking it on a very consistent and daily basis. She does palpate her breasts are most every day. She is noticing normal breast tissue there are no palpable masses. She denies any hot flashes vaginal bleeding or discharge no myalgias or arthralgias. She has not had any recent hospitalizations. Remainder of the 10 point review of systems is negative. MEDICAL HISTORY: Past Medical History  Diagnosis Date  . CAD (coronary artery disease)     Catheterization, January, 2007 patent grafts / hospitalization October 2 010 no MI  . Syncope 03/2009    ?orthostasis  . Allergic rhinitis   . History of colon polyps   . Diverticulosis of colon   . GERD (gastroesophageal reflux disease)   . Hyperlipidemia   . Abdominal  pain, other specified site     motility disorder, chorinic functional, severe (Dr Juanda Chance). Possible OCD w/complusive use of enemas and laxatives; psychotic features 2011  . Positive H. pylori test 2911  . Psychosomatic disorder     w/GI fixation  . Anxiety   . Depression   . Stress incontinence, female   . Vitamin D deficiency   . Vitamin B12 deficiency   . Renal insufficiency   . UTI (urinary tract infection)   . Hypokalemia     mild  . Insomnia   . Carotid artery disease     Doppler  November, 0272, 0-39% LICA, 60-79% R. ICA.Marland Kitchen increased velocity... possibly from serpentine vessel  . Ejection fraction     50-55%, no wall motion abnormalities, echo, November, 2010  . Edema   . Hemothorax on right   . Breast cancer 2011    Dr Welton Flakes- right breast    ALLERGIES:  is allergic to baclofen; belladonna; ciprofloxacin; doxycycline; erythromycin; gabapentin; iohexol; metoclopramide; metronidazole; nalbuphine; nitrofurantoin; penicillins; procaine hcl; sucralfate; sulfonamide derivatives; and methenamine.  MEDICATIONS:  Current Outpatient Prescriptions  Medication Sig Dispense Refill  . anastrozole (ARIMIDEX) 1 MG tablet TAKE 1 TABLET BY MOUTH EVERY DAY  90 tablet  3  . clotrimazole-betamethasone (LOTRISONE) cream Apply topically 2 (two) times daily.  90 g  3  . conjugated estrogens (PREMARIN) vaginal cream Place 0.5 Applicatorfuls vaginally 2 (two) times a week.  42.5 g  5  . [facility] ALLERGY 25 MG capsule TAKE ONE CAPSULE BY MOUTH EVERY 6 HOURS AS NEEDED ITCHING  60 capsule  1  . CYMBALTA 30 MG capsule TAKE 1 CAPSULE BY MOUTH DAILY  30 each  2  . diphenhydrAMINE (BENADRYL) 25 mg capsule Take 1 capsule (25 mg total) by mouth every 6 (six) hours as needed for itching.  60 capsule  5  . divalproex (DEPAKOTE) 250 MG DR tablet Take 1 tablet (250 mg total) by mouth 2 (two) times daily.  60 tablet  11  . indapamide (LOZOL) 2.5 MG tablet  Take 1 tablet (2.5 mg total) by mouth every morning.  90 tablet  3  . KLOR-CON M10 10 MEQ tablet TAKE 1 TABLET BY MOUTH 2 TIMES A DAY  60 tablet  5  . Lactulose 20 GM/30ML SOLN Take 30-60 mLs by mouth as needed.        . loratadine (CLARITIN) 10 MG tablet Take 1 tablet (10 mg total) by mouth daily.  100 tablet  3  . methadone (DOLOPHINE) 10 MG tablet Take 1 tablet (10 mg total) by mouth 5 (five) times daily as needed. Fill on or after 03/31/12. Once every 30 days  150 tablet  0  . metoCLOPramide (REGLAN) 5 MG tablet TAKE 1 TABLET BY MOUTH 3 TIMES A DAY BEFORE  MEALS  90 tablet  1  . metoprolol succinate (TOPROL-XL) 25 MG 24 hr tablet Take 1 tablet (25 mg total) by mouth daily.  90 tablet  3  . mometasone (ELOCON) 0.1 % ointment Apply topically 2 (two) times daily. Apply to vaginal area  45 g  1  . omeprazole (PRILOSEC) 20 MG capsule Take 1 capsule (20 mg total) by mouth 2 (two) times daily.  60 capsule  11  . promethazine (PHENERGAN) 25 MG tablet Take 1 tablet (25 mg total) by mouth every 8 (eight) hours as needed for nausea.  60 tablet  5  . trimethoprim (TRIMPEX) 100 MG tablet Take 1 tablet (100 mg total) by mouth daily.  90 tablet  3  . trimethoprim (TRIMPEX) 100 MG tablet TAKE 1 TABLET BY MOUTH EVERY DAY  30 tablet  5  . Vitamin D, Ergocalciferol, (DRISDOL) 50000 UNITS CAPS Take 1 capsule (50,000 Units total) by mouth every 30 (thirty) days.  6 capsule  1  . zolpidem (AMBIEN) 10 MG tablet Take 1 tablet (10 mg total) by mouth at bedtime as needed.  90 tablet  1  . LORazepam (ATIVAN) 1 MG tablet TAKE 1 TABLET BY MOUTH EVERY 8 HOURS AS NEEDED  90 tablet  4  . Pancrelipase, Lip-Prot-Amyl, (CREON) 24000 UNITS CPEP Take 1 capsule (24,000 Units total) by mouth 3 (three) times daily with meals.  90 capsule  5  . Tamsulosin HCl (FLOMAX) 0.4 MG CAPS Take 1 capsule (0.4 mg total) by mouth daily.  90 capsule  3    SURGICAL HISTORY:  Past Surgical History  Procedure Date  . Coronary artery bypass graft   . Myomectomy     Uterine and ovarian  . Breast fibroadenoma surgery   . Hemicolectomy 2005    Left  . Breast lumpectomy 2011  . Right vats (video-assisted thoracoscopic surgery) with     REVIEW OF SYSTEMS:  Pertinent items are noted in HPI.   PHYSICAL EXAMINATION: General appearance: alert, cooperative and appears stated age Lymph nodes: Cervical, supraclavicular, and axillary nodes normal. Resp: clear to auscultation bilaterally and normal percussion bilaterally Back: symmetric, no curvature. ROM normal. No CVA tenderness. Cardio: regular rate and  rhythm, S1, S2 normal, no murmur, click, rub or gallop GI: soft, non-tender; bowel sounds normal; no masses,  no organomegaly Extremities: extremities normal, atraumatic, no cyanosis or edema Neurologic: Alert and oriented X 3, normal strength and tone. Normal symmetric reflexes. Normal coordination and gait Bilateral breast examination: Bilateral breast exam is performed there are no masses nipple discharge no axillary lymphadenopathy is noted in either of the breasts. ECOG PERFORMANCE STATUS: 1 - Symptomatic but completely ambulatory  Blood pressure 137/76, pulse 74, temperature 98.5 F (36.9 C), resp. rate 20,  height 5\' 4"  (1.626 m), weight 128 lb 11.2 oz (58.378 kg).  LABORATORY DATA: Lab Results  Component Value Date   WBC 5.9 02/29/2012   HGB 11.0* 02/29/2012   HCT 33.5* 02/29/2012   MCV 96.6 02/29/2012   PLT 197.0 02/29/2012      Chemistry      Component Value Date/Time   NA 135 02/29/2012 1127   K 4.7 02/29/2012 1127   CL 100 02/29/2012 1127   CO2 29 02/29/2012 1127   BUN 23 02/29/2012 1127   CREATININE 1.1 02/29/2012 1127      Component Value Date/Time   CALCIUM 8.8 02/29/2012 1127   ALKPHOS 99 08/14/2011 1117   AST 20 08/14/2011 1117   ALT 18 08/14/2011 1117   BILITOT 0.7 08/14/2011 1117       RADIOGRAPHIC STUDIES:  No results found.  ASSESSMENT: 78 year old female with  #1 stage I invasive ductal carcinoma of the left breast status post lumpectomy in December 2011. This was then followed by Arimidex 1 mg daily starting in January 2012. Overall she is doing well she has no evidence of recurrent disease.   PLAN:   #1 patient will continue Arimidex on a daily basis overall she is doing well with it.  #2 clinically patient is doing well she will be seen back in 6 months time. All questions were answered. The patient knows to call the clinic with any problems, questions or concerns. We can certainly see the patient much sooner if necessary.  I spent 15 minutes  counseling the patient face to face. The total time spent in the appointment was 30 minutes.    Drue Second, MD Medical/Oncology [facility] [phone] (beeper) [phone] (Office)  03/03/2012, 9:51 AM

## 2012-03-03 NOTE — Patient Instructions (Addendum)
Continue to take the ansatrozole  No evidence of cancer  I will see you back in April 2014

## 2012-03-03 NOTE — Telephone Encounter (Signed)
Gave patient appointment for 09-01-2012 starting at 11:00am

## 2012-03-26 LAB — IGG FOOD PANEL
Allergen, Milk, IgG: 13.1 ug/mL — ABNORMAL HIGH (ref <0.15)
Chicken, IgG: 0.16 ug/mL — ABNORMAL HIGH (ref <0.15)
Egg yolk, IgG: 3.3 ug/mL — ABNORMAL HIGH (ref <2.0)
Wheat, IgG: 2.03 ug/mL — ABNORMAL HIGH (ref <0.15)

## 2012-03-31 ENCOUNTER — Ambulatory Visit (INDEPENDENT_AMBULATORY_CARE_PROVIDER_SITE_OTHER): Payer: Medicare Other | Admitting: Cardiology

## 2012-03-31 ENCOUNTER — Encounter: Payer: Self-pay | Admitting: Cardiology

## 2012-03-31 VITALS — BP 136/80 | HR 74 | Wt 130.0 lb

## 2012-03-31 DIAGNOSIS — R55 Syncope and collapse: Secondary | ICD-10-CM

## 2012-03-31 DIAGNOSIS — I2510 Atherosclerotic heart disease of native coronary artery without angina pectoris: Secondary | ICD-10-CM

## 2012-03-31 DIAGNOSIS — I779 Disorder of arteries and arterioles, unspecified: Secondary | ICD-10-CM

## 2012-03-31 NOTE — Patient Instructions (Addendum)
Your physician wants you to follow-up in:  6 months. You will receive a reminder letter in the mail two months in advance. If you don't receive a letter, please call our office to schedule the follow-up appointment.   

## 2012-03-31 NOTE — Progress Notes (Signed)
HPI Patient is seen today for followup coronary disease. There is a history of CABG. Her coronaries and and stable. She has an unstable gait. There is question of syncopal episodes but we feel that have not been cardiac in origin. Depression is a significant problem for her. Her elderly husband takes care of her.  Allergies  Allergen Reactions  . Baclofen     REACTION: confusion  . Belladonna   . Ciprofloxacin   . Doxycycline   . Erythromycin   . Gabapentin   . Iohexol      Code: RASH   . Metoclopramide     Clonic spasms  . Metronidazole     REACTION: rash  . Nalbuphine   . Nitrofurantoin     REACTION: nausea  . Penicillins   . Procaine Hcl   . Sucralfate     REACTION: nausea  . Sulfonamide Derivatives   . Methenamine Rash    Current Outpatient Prescriptions  Medication Sig Dispense Refill  . anastrozole (ARIMIDEX) 1 MG tablet TAKE 1 TABLET BY MOUTH EVERY DAY  90 tablet  3  . clotrimazole-betamethasone (LOTRISONE) cream Apply topically 2 (two) times daily.  90 g  3  . conjugated estrogens (PREMARIN) vaginal cream Place 0.5 Applicatorfuls vaginally 2 (two) times a week.  42.5 g  5  . [facility] ALLERGY 25 MG capsule TAKE ONE CAPSULE BY MOUTH EVERY 6 HOURS AS NEEDED ITCHING  60 capsule  1  . CYMBALTA 30 MG capsule TAKE 1 CAPSULE BY MOUTH DAILY  30 each  2  . diphenhydrAMINE (BENADRYL) 25 mg capsule Take 1 capsule (25 mg total) by mouth every 6 (six) hours as needed for itching.  60 capsule  5  . divalproex (DEPAKOTE) 250 MG DR tablet Take 1 tablet (250 mg total) by mouth 2 (two) times daily.  60 tablet  11  . indapamide (LOZOL) 2.5 MG tablet Take 1 tablet (2.5 mg total) by mouth every morning.  90 tablet  3  . KLOR-CON M10 10 MEQ tablet TAKE 1 TABLET BY MOUTH 2 TIMES A DAY  60 tablet  5  . Lactulose 20 GM/30ML SOLN Take 30-60 mLs by mouth as needed.        . loratadine (CLARITIN) 10 MG tablet Take 1 tablet (10 mg total) by mouth daily.  100 tablet  3  . LORazepam (ATIVAN) 1 MG  tablet TAKE 1 TABLET BY MOUTH EVERY 8 HOURS AS NEEDED  90 tablet  4  . methadone (DOLOPHINE) 10 MG tablet Take 1 tablet (10 mg total) by mouth 5 (five) times daily as needed. Fill on or after 03/31/12. Once every 30 days  150 tablet  0  . metoCLOPramide (REGLAN) 5 MG tablet TAKE 1 TABLET BY MOUTH 3 TIMES A DAY BEFORE MEALS  90 tablet  1  . metoprolol succinate (TOPROL-XL) 25 MG 24 hr tablet Take 1 tablet (25 mg total) by mouth daily.  90 tablet  3  . mometasone (ELOCON) 0.1 % ointment Apply topically 2 (two) times daily. Apply to vaginal area  45 g  1  . omeprazole (PRILOSEC) 20 MG capsule Take 1 capsule (20 mg total) by mouth 2 (two) times daily.  60 capsule  11  . Pancrelipase, Lip-Prot-Amyl, (CREON) 24000 UNITS CPEP Take 1 capsule (24,000 Units total) by mouth 3 (three) times daily with meals.  90 capsule  5  . promethazine (PHENERGAN) 25 MG tablet Take 1 tablet (25 mg total) by mouth every 8 (eight) hours as  needed for nausea.  60 tablet  5  . Tamsulosin HCl (FLOMAX) 0.4 MG CAPS Take 1 capsule (0.4 mg total) by mouth daily.  90 capsule  3  . trimethoprim (TRIMPEX) 100 MG tablet Take 1 tablet (100 mg total) by mouth daily.  90 tablet  3  . trimethoprim (TRIMPEX) 100 MG tablet TAKE 1 TABLET BY MOUTH EVERY DAY  30 tablet  5  . Vitamin D, Ergocalciferol, (DRISDOL) 50000 UNITS CAPS Take 1 capsule (50,000 Units total) by mouth every 30 (thirty) days.  6 capsule  1  . zolpidem (AMBIEN) 10 MG tablet Take 1 tablet (10 mg total) by mouth at bedtime as needed.  90 tablet  1    History   Social History  . Marital Status: Married    Spouse Name: N/A    Number of Children: N/A  . Years of Education: N/A   Occupational History  . RETIRED MD    Social History Main Topics  . Smoking status: Never Smoker   . Smokeless tobacco: Not on file  . Alcohol Use: No  . Drug Use: No  . Sexually Active: No   Other Topics Concern  . Not on file   Social History Narrative   Patient is an immigrant from  [place]. 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 Currently living w/her husband.     Family History  Problem Relation Age of Onset  . Coronary artery disease      Female 1st degree relative <60  . Colon cancer Mother   . Arthritis Mother   . Heart disease Mother   . Heart disease Father     Past Medical History  Diagnosis Date  . CAD (coronary artery disease)     Catheterization, January, 2007 patent grafts / hospitalization October 2 010 no MI  . Syncope 03/2009    ?orthostasis  . Allergic rhinitis   . History of colon polyps   . Diverticulosis of colon   . GERD (gastroesophageal reflux disease)   . Hyperlipidemia   . Abdominal  pain, other specified site     motility disorder, chorinic functional, severe (Dr Juanda Chance). Possible OCD w/complusive use of enemas and laxatives; psychotic features 2011  . Positive H. pylori test 2911  . Psychosomatic disorder     w/GI fixation  . Anxiety   . Depression   . Stress incontinence, female   . Vitamin D deficiency   . Vitamin B12 deficiency   . Renal insufficiency   . UTI (urinary tract infection)   . Hypokalemia     mild  . Insomnia   . Carotid artery disease     Doppler November, 1610, 0-39% LICA, 60-79% R. ICA.Marland Kitchen increased velocity... possibly from serpentine vessel  . Ejection fraction     50-55%, no wall motion abnormalities, echo, November, 2010  . Edema   . Hemothorax on right   . Breast cancer 2011    Dr Welton Flakes- right breast    Past Surgical History  Procedure Date  . Coronary artery bypass graft   . Myomectomy     Uterine and ovarian  . Breast fibroadenoma surgery   . Hemicolectomy 2005    Left  . Breast lumpectomy 2011  . Right vats (video-assisted thoracoscopic surgery) with     Patient Active Problem List  Diagnosis  . HELICOBACTER PYLORI INFECTION  . BENIGN NEOPLASM OF COLON  . B12 DEFICIENCY  . Unspecified Vitamin D  Deficiency  . HYPERLIPIDEMIA    . HYPONATREMIA  . HYPOKALEMIA  . PSYCHOSIS  . ANXIETY  . OBSESSIVE-COMPULSIVE DISORDER  . DEPENDENCE, DRUG NOS, CONTINUOUS  . INSOMNIA, CHRONIC  . DEPRESSION  . Chronic pain syndrome  . MYOCARDIAL INFARCTION, HX OF  . SINUS BRADYCARDIA  . HYPOTENSION  . ALLERGIC RHINITIS  . GERD  . HIATAL HERNIA  . DIVERTICULOSIS, COLON  . RENAL INSUFFICIENCY  . CYSTITIS  . LUMP OR MASS IN BREAST  . PRURITUS  . URTICARIA  . TROCHANTERIC BURSITIS  . LEG PAIN  . SYMPTOM, MEMORY LOSS  . CHEST PAIN, SUBSTERNAL  . DYSPHAGIA UNSPECIFIED  . URINARY RETENTION  . STRESS INCONTINENCE  . FREQUENCY, URINARY  . ABDOMINAL PAIN, CHRONIC  . ABDOMINAL PAIN, RIGHT UPPER QUADRANT  . COLONIC POLYPS, HX OF  . BREAST CANCER, HX OF  . Syncope  . CAD (coronary artery disease)  . Hx of CABG  . Carotid artery disease  . Ejection fraction  . Edema  . Hemothorax on right  . Anemia, iron deficiency  . Spasm  . Vaginitis and vulvovaginitis    ROS   The patient does not speak much [place]. We communicated very well through the interpreter. She denies fever, chills, headache, sweats, rash, change in vision, change in hearing, cough, nausea vomiting, urinary symptoms. All other systems are reviewed and are negative.  PHYSICAL EXAM  The patient is stable. There is no jugulovenous distention. Lungs are clear. Respiratory effort is nonlabored. Cardiac exam reveals S1 and S2. There no clicks or significant murmurs. The abdomen is soft. There is no peripheral edema.  Filed Vitals:   03/31/12 0906  BP: 136/80  Pulse: 74  Weight: 130 lb (58.968 kg)   EKG is done today and reviewed by me. There are inferior Q waves. There is sinus rhythm. There is no significant change.  ASSESSMENT & PLAN

## 2012-03-31 NOTE — Assessment & Plan Note (Signed)
Coronary disease is stable. Catheterization in 2007 revealed patent grafts. Hospitalization in 2010 with chest pain revealed no MI. She is stable. No further workup at this time.

## 2012-03-31 NOTE — Assessment & Plan Note (Signed)
Her carotid disease is moderate. He is being followed carefully.

## 2012-03-31 NOTE — Assessment & Plan Note (Signed)
She has an unstable gait. There is question of syncopal episodes. We have not documented a cardiac basis. No further workup.

## 2012-04-09 ENCOUNTER — Other Ambulatory Visit: Payer: Self-pay | Admitting: Internal Medicine

## 2012-04-09 NOTE — Telephone Encounter (Signed)
Ok to Rf? 

## 2012-04-16 ENCOUNTER — Other Ambulatory Visit: Payer: Self-pay | Admitting: Internal Medicine

## 2012-04-16 NOTE — Telephone Encounter (Signed)
Pt request refill on Zolpidem tartrate 10mg , please send to [facility]

## 2012-04-16 NOTE — Telephone Encounter (Signed)
Done

## 2012-04-22 ENCOUNTER — Other Ambulatory Visit: Payer: Self-pay | Admitting: Internal Medicine

## 2012-04-28 IMAGING — CR DG CHEST 2V
2 series · 2 of 2 positions shown · non-contrast
Comparison: 01/01/2011

CLINICAL DATA: Heart surgery

CHEST - 2 VIEW

[w chest lat]
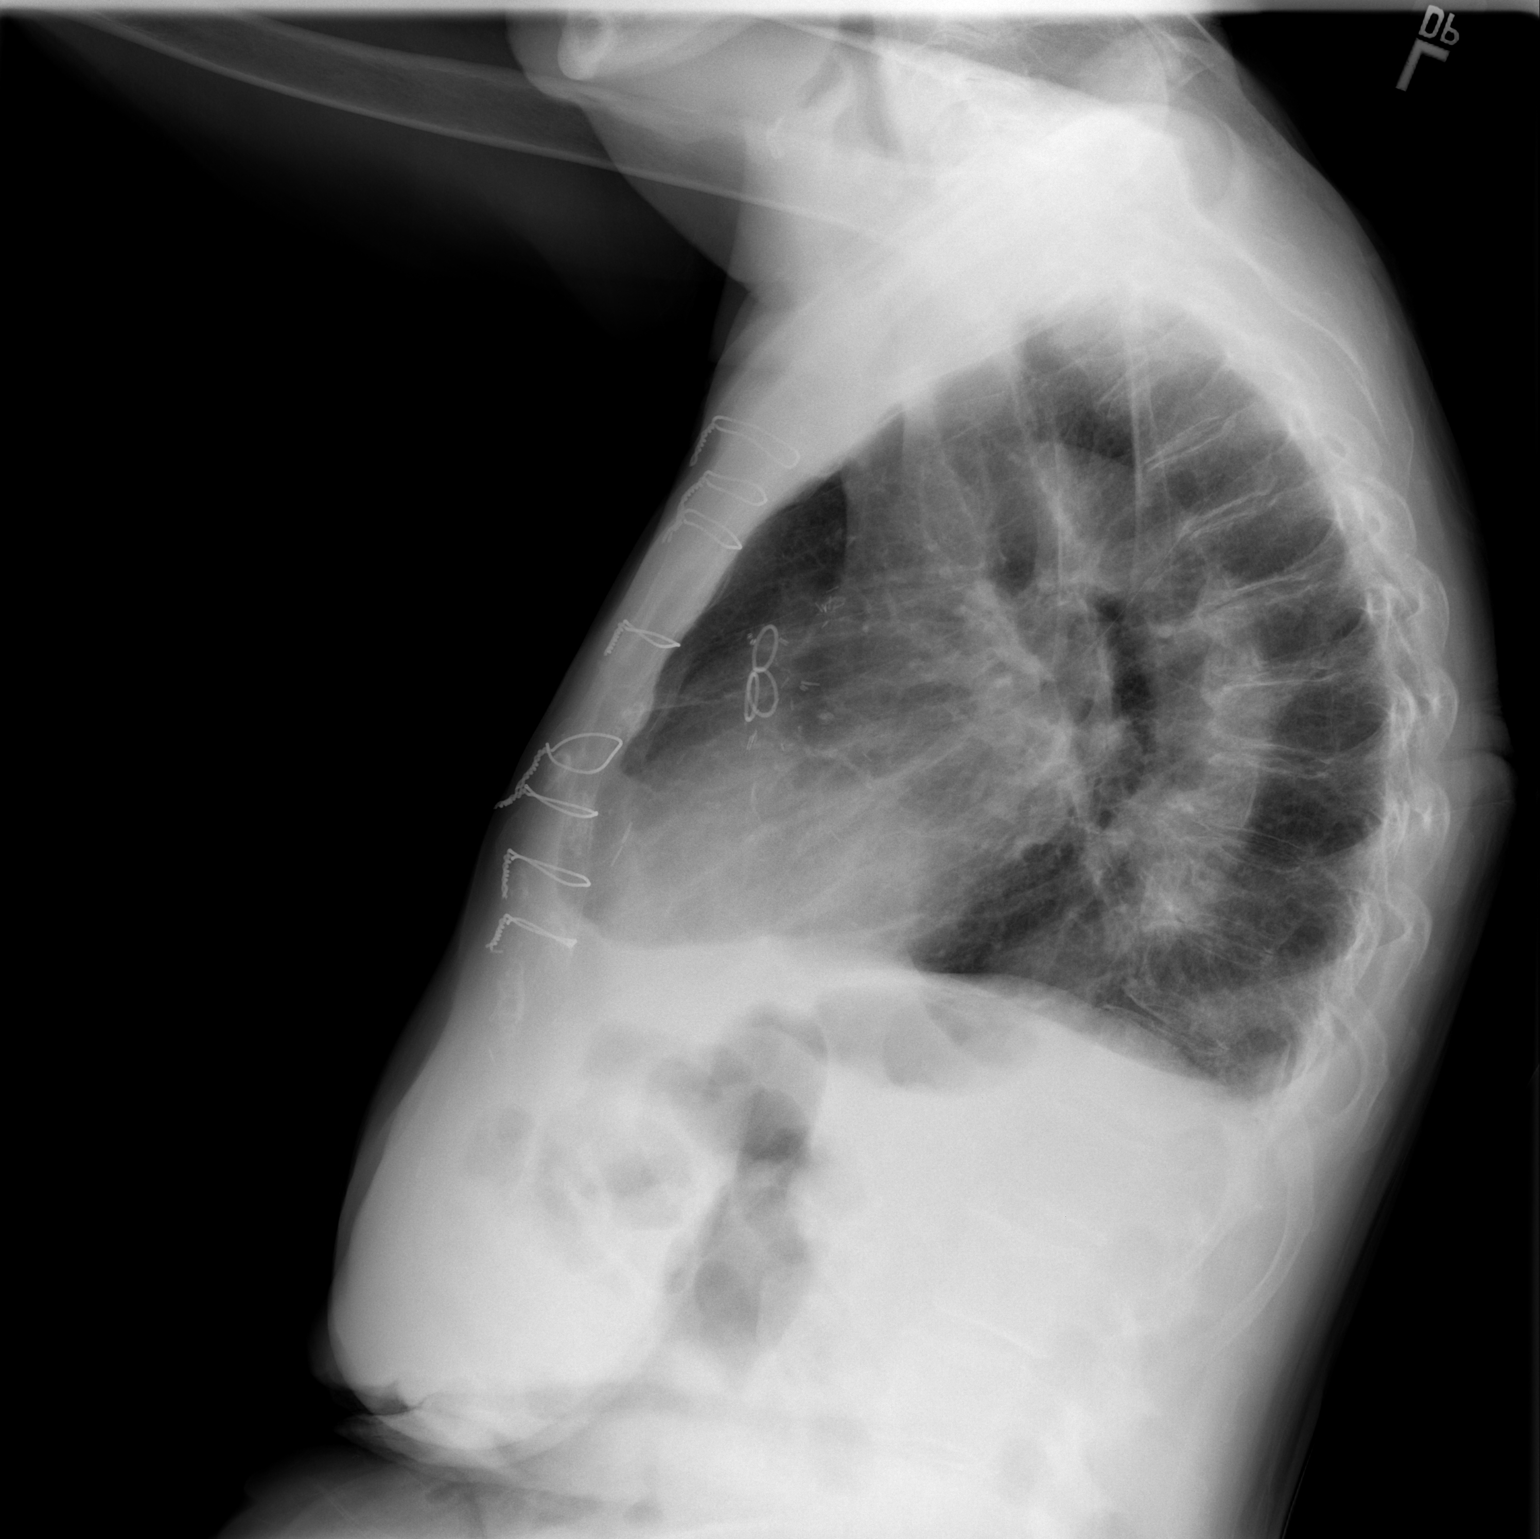

[w chest ap]
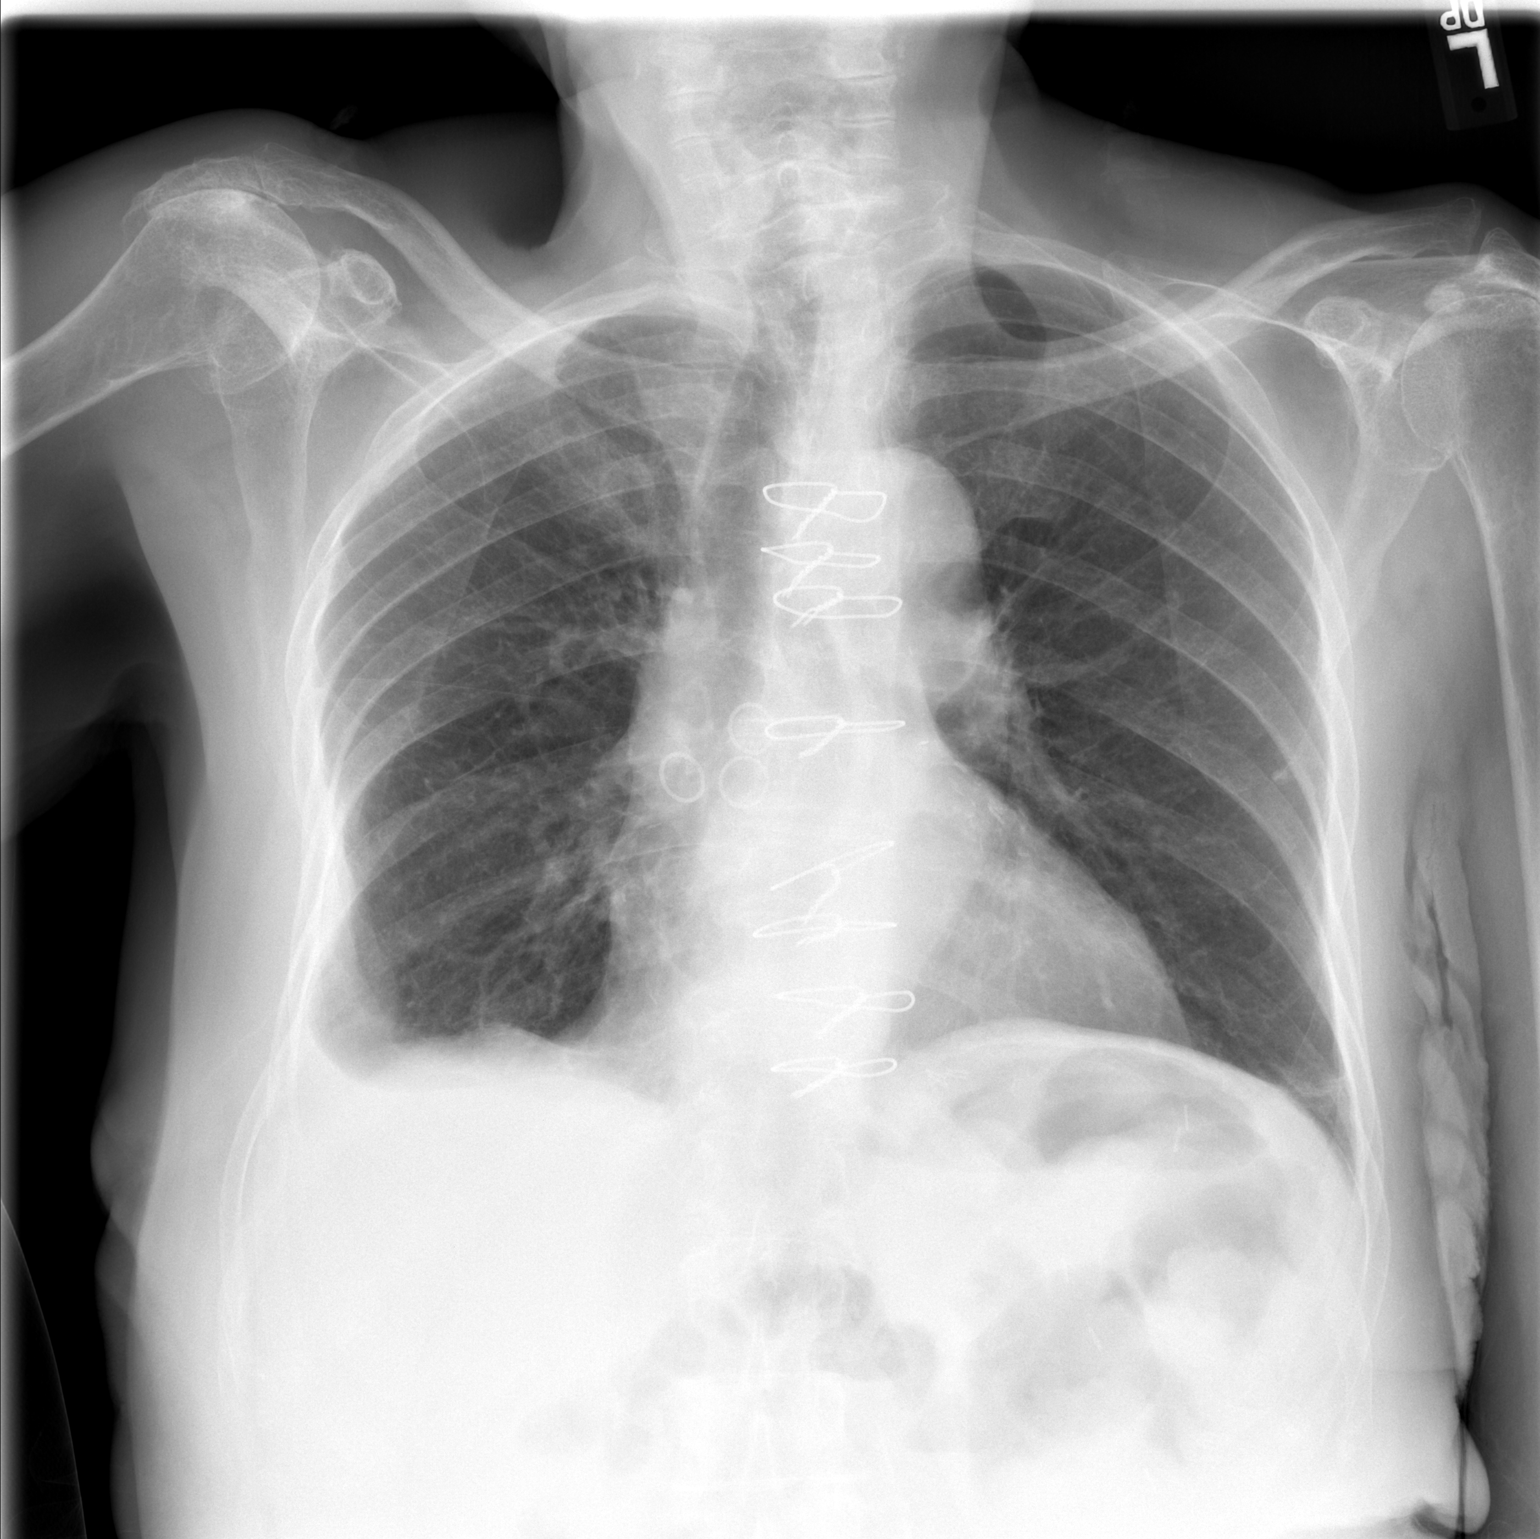

[2 of 2 positions shown; findings below may reference images not displayed]

FINDINGS: Small right pleural effusion, improved.  Minimal left
basilar scarring/atelectasis.  Stable probable tiny calcified
granuloma in the left mid lung.  No pneumothorax.

Heart is top normal in size. Postsurgical changes related to prior
CABG.

Degenerative changes of the visualized thoracolumbar spine.
Degenerative changes of the bilateral shoulders.
IMPRESSION: Small right pleural effusion, improved.

## 2012-05-13 ENCOUNTER — Telehealth: Payer: Self-pay | Admitting: Internal Medicine

## 2012-05-13 MED ORDER — DULOXETINE HCL 30 MG PO CPEP: 30.0000 mg | ORAL_CAPSULE | Freq: Every day | ORAL | Status: DC

## 2012-05-13 NOTE — Telephone Encounter (Signed)
Needs Cymbalta

## 2012-06-02 ENCOUNTER — Ambulatory Visit (INDEPENDENT_AMBULATORY_CARE_PROVIDER_SITE_OTHER): Payer: Medicare Other | Admitting: Internal Medicine

## 2012-06-02 ENCOUNTER — Encounter: Payer: Self-pay | Admitting: Internal Medicine

## 2012-06-02 ENCOUNTER — Other Ambulatory Visit: Payer: Medicare Other

## 2012-06-02 VITALS — BP 102/60 | HR 64 | Temp 97.8°F | Resp 16 | Wt 129.0 lb

## 2012-06-02 DIAGNOSIS — Z853 Personal history of malignant neoplasm of breast: Secondary | ICD-10-CM

## 2012-06-02 DIAGNOSIS — E538 Deficiency of other specified B group vitamins: Secondary | ICD-10-CM

## 2012-06-02 DIAGNOSIS — I2510 Atherosclerotic heart disease of native coronary artery without angina pectoris: Secondary | ICD-10-CM

## 2012-06-02 DIAGNOSIS — E785 Hyperlipidemia, unspecified: Secondary | ICD-10-CM

## 2012-06-02 DIAGNOSIS — E876 Hypokalemia: Secondary | ICD-10-CM

## 2012-06-02 DIAGNOSIS — F329 Major depressive disorder, single episode, unspecified: Secondary | ICD-10-CM

## 2012-06-02 DIAGNOSIS — R350 Frequency of micturition: Secondary | ICD-10-CM

## 2012-06-02 DIAGNOSIS — R109 Unspecified abdominal pain: Secondary | ICD-10-CM

## 2012-06-02 DIAGNOSIS — K219 Gastro-esophageal reflux disease without esophagitis: Secondary | ICD-10-CM

## 2012-06-02 DIAGNOSIS — F3289 Other specified depressive episodes: Secondary | ICD-10-CM

## 2012-06-02 MED ORDER — DULOXETINE HCL 30 MG PO CPEP: 30.0000 mg | ORAL_CAPSULE | Freq: Every day | ORAL | Status: DC

## 2012-06-02 MED ORDER — METHADONE HCL 10 MG PO TABS: 10.0000 mg | ORAL_TABLET | Freq: Every day | ORAL | Status: DC | PRN

## 2012-06-02 MED ORDER — ANASTROZOLE 1 MG PO TABS: 1.0000 mg | ORAL_TABLET | Freq: Every day | ORAL | Status: DC

## 2012-06-02 MED ORDER — LORAZEPAM 1 MG PO TABS: 1.0000 mg | ORAL_TABLET | Freq: Three times a day (TID) | ORAL | Status: DC | PRN

## 2012-06-02 MED ORDER — OMEPRAZOLE 20 MG PO CPDR: 20.0000 mg | DELAYED_RELEASE_CAPSULE | Freq: Two times a day (BID) | ORAL | Status: DC

## 2012-06-02 NOTE — Assessment & Plan Note (Signed)
Continue with current prescription therapy as reflected on the Med list.  

## 2012-06-02 NOTE — Assessment & Plan Note (Signed)
Poor Rx tolerance and compliance

## 2012-06-02 NOTE — Progress Notes (Signed)
Subjective:    HPI   F/u insomnia - current meds do not help   The patient is here to follow up on chronic paranoid depression, anxiety,  and chronic moderate rectal pain symptoms controlled with medicines some. C/o chronic severe abd pain. C/o spasms in legs and arms at night; not sleeping  BP Readings from Last 3 Encounters:  06/02/12 102/60  03/31/12 136/80  03/03/12 137/76   Wt Readings from Last 3 Encounters:  06/02/12 129 lb (58.514 kg)  03/31/12 130 lb (58.968 kg)  03/03/12 128 lb 11.2 oz (58.378 kg)     BP 102/60  Pulse 64  Temp 97.8 F (36.6 C) (Oral)  Resp 16  Wt 129 lb (58.514 kg)  Past Medical History  Diagnosis Date  . CAD (coronary artery disease)     Catheterization, January, 2007 patent grafts / hospitalization October 2 010 no MI  . Syncope 03/2009    ?orthostasis  . Allergic rhinitis   . History of colon polyps   . Diverticulosis of colon   . GERD (gastroesophageal reflux disease)   . Hyperlipidemia   . Abdominal  pain, other specified site     motility disorder, chorinic functional, severe (Dr Juanda Chance). Possible OCD w/complusive use of enemas and laxatives; psychotic features 2011  . Positive H. pylori test 2911  . Psychosomatic disorder     w/GI fixation  . Anxiety   . Depression   . Stress incontinence, female   . Vitamin D deficiency   . Vitamin B12 deficiency   . Renal insufficiency   . UTI (urinary tract infection)   . Hypokalemia     mild  . Insomnia   . Carotid artery disease     Doppler November, 7829, 0-39% LICA, 60-79% R. ICA.Marland Kitchen increased velocity... possibly from serpentine vessel  . Ejection fraction     50-55%, no wall motion abnormalities, echo, November, 2010  . Edema   . Hemothorax on right   . Breast cancer 2011    Dr Welton Flakes- right breast   Past Surgical History  Procedure Date  . Coronary artery bypass graft   . Myomectomy     Uterine and ovarian  . Breast fibroadenoma surgery   . Hemicolectomy 2005    Left    . Breast lumpectomy 2011  . Right vats (video-assisted thoracoscopic surgery) with     reports that she has never smoked. She does not have any smokeless tobacco history on file. She reports that she does not drink alcohol or use illicit drugs. family history includes Arthritis in her mother; Colon cancer in her mother; Coronary artery disease in an unspecified family member; and Heart disease in her father and mother. Allergies  Allergen Reactions  . Baclofen     REACTION: confusion  . Belladonna   . Ciprofloxacin   . Doxycycline   . Erythromycin   . Gabapentin   . Iohexol      Code: RASH   . Metoclopramide     Clonic spasms  . Metronidazole     REACTION: rash  . Nalbuphine   . Nitrofurantoin     REACTION: nausea  . Penicillins   . Procaine Hcl   . Sucralfate     REACTION: nausea  . Sulfonamide Derivatives   . Methenamine Rash   Current Outpatient Prescriptions on File Prior to Visit  Medication Sig Dispense Refill  . anastrozole (ARIMIDEX) 1 MG tablet TAKE 1 TABLET BY MOUTH EVERY DAY  30 tablet  1  .  conjugated estrogens (PREMARIN) vaginal cream Place 0.5 Applicatorfuls vaginally 2 (two) times a week.  42.5 g  5  . [employer] ALLERGY 25 MG capsule TAKE ONE CAPSULE BY MOUTH EVERY 6 HOURS AS NEEDED ITCHING  60 capsule  1  . diphenhydrAMINE (BENADRYL) 25 mg capsule Take 1 capsule (25 mg total) by mouth every 6 (six) hours as needed for itching.  60 capsule  5  . divalproex (DEPAKOTE) 250 MG DR tablet Take 1 tablet (250 mg total) by mouth 2 (two) times daily.  60 tablet  11  . DULoxetine (CYMBALTA) 30 MG capsule Take 1 capsule (30 mg total) by mouth daily.  30 capsule  5  . indapamide (LOZOL) 2.5 MG tablet Take 1 tablet (2.5 mg total) by mouth every morning.  90 tablet  3  . KLOR-CON M10 10 MEQ tablet TAKE 1 TABLET BY MOUTH 2 TIMES A DAY  60 tablet  5  . Lactulose 20 GM/30ML SOLN Take 30-60 mLs by mouth as needed.        . loratadine (CLARITIN) 10 MG tablet Take 1 tablet (10 mg  total) by mouth daily.  100 tablet  3  . LORazepam (ATIVAN) 1 MG tablet TAKE 1 TABLET BY MOUTH EVERY 8 HOURS AS NEEDED  90 tablet  4  . methadone (DOLOPHINE) 10 MG tablet Take 1 tablet (10 mg total) by mouth 5 (five) times daily as needed. Fill on or after 03/31/12. Once every 30 days  150 tablet  0  . metoCLOPramide (REGLAN) 5 MG tablet TAKE 1 TABLET BY MOUTH 3 TIMES A DAY BEFORE MEALS  90 tablet  1  . omeprazole (PRILOSEC) 20 MG capsule Take 1 capsule (20 mg total) by mouth 2 (two) times daily.  60 capsule  11  . promethazine (PHENERGAN) 25 MG tablet Take 1 tablet (25 mg total) by mouth every 8 (eight) hours as needed for nausea.  60 tablet  5  . Tamsulosin HCl (FLOMAX) 0.4 MG CAPS Take 1 capsule (0.4 mg total) by mouth daily.  90 capsule  3  . trimethoprim (TRIMPEX) 100 MG tablet Take 1 tablet (100 mg total) by mouth daily.  90 tablet  3  . zolpidem (AMBIEN) 10 MG tablet TAKE 1 TABLET AT BEDTIME AS NEEDED  90 tablet  1  . clotrimazole-betamethasone (LOTRISONE) cream Apply topically 2 (two) times daily.  90 g  3  . metoprolol succinate (TOPROL-XL) 25 MG 24 hr tablet Take 1 tablet (25 mg total) by mouth daily.  90 tablet  3  . mometasone (ELOCON) 0.1 % ointment Apply topically 2 (two) times daily. Apply to vaginal area  45 g  1  . Pancrelipase, Lip-Prot-Amyl, (CREON) 24000 UNITS CPEP Take 1 capsule (24,000 Units total) by mouth 3 (three) times daily with meals.  90 capsule  5  . Vitamin D, Ergocalciferol, (DRISDOL) 50000 UNITS CAPS Take 1 capsule (50,000 Units total) by mouth every 30 (thirty) days.  6 capsule  1      Review of Systems  Constitutional: Positive for fatigue. Negative for activity change, appetite change and unexpected weight change.  HENT: Negative for congestion, mouth sores and sinus pressure.   Eyes: Negative for visual disturbance.  Respiratory: Negative for cough and chest tightness.   Gastrointestinal: Positive for constipation. Negative for abdominal pain, diarrhea and  blood in stool.  Genitourinary: Negative for difficulty urinating and vaginal pain.  Musculoskeletal: Negative for back pain and gait problem.  Skin: Negative for pallor and rash.  Neurological:  Negative for dizziness, tremors, weakness, numbness and headaches.  Psychiatric/Behavioral: Positive for behavioral problems, sleep disturbance, dysphoric mood and decreased concentration. Negative for suicidal ideas, hallucinations, confusion and agitation. The patient is nervous/anxious.        Objective:   Physical Exam  Constitutional: She appears well-developed. No distress.       Chron ill appearing  HENT:  Head: Normocephalic.  Right Ear: External ear normal.  Left Ear: External ear normal.  Nose: Nose normal.  Mouth/Throat: Oropharynx is clear and moist.  Eyes: Conjunctivae normal are normal. Pupils are equal, round, and reactive to light. Right eye exhibits no discharge. Left eye exhibits no discharge.  Neck: Normal range of motion. Neck supple. No JVD present. No tracheal deviation present. No thyromegaly present.  Cardiovascular: Normal rate, regular rhythm and normal heart sounds.   Pulmonary/Chest: No stridor. No respiratory distress. She has no wheezes.  Abdominal: Soft. Bowel sounds are normal. She exhibits no distension and no mass. There is tenderness (mild). There is no rebound and no guarding.  Musculoskeletal: She exhibits no edema and no tenderness.  Lymphadenopathy:    She has no cervical adenopathy.  Neurological: She displays normal reflexes. No cranial nerve deficit. She exhibits normal muscle tone. Coordination normal.  Skin: No rash noted. No erythema.  Psychiatric: Her behavior is normal. Thought content normal.       depressed     Lab Results  Component Value Date   WBC 5.9 02/29/2012   HGB 11.0* 02/29/2012   HCT 33.5* 02/29/2012   PLT 197.0 02/29/2012   GLUCOSE 75 02/29/2012   CHOL 302* 10/07/2009   TRIG 274.0* 10/07/2009   HDL 51.50 10/07/2009   LDLDIRECT  195.8 10/07/2009   LDLCALC  Value: 280        Total Cholesterol/HDL:CHD Risk Coronary Heart Disease Risk Table                     Men   Women  1/2 Average Risk   3.4   3.3  Average Risk       5.0   4.4  2 X Average Risk   9.6   7.1  3 X Average Risk  23.4   11.0        Use the calculated Patient Ratio above and the CHD Risk Table to determine the patient's CHD Risk.        ATP III CLASSIFICATION (LDL):  <100     mg/dL   Optimal  161-096  mg/dL   Near or Above                    Optimal  130-159  mg/dL   Borderline  045-409  mg/dL   High  >811     mg/dL   Very High* 91/08/7827   ALT 18 08/14/2011   AST 20 08/14/2011   NA 135 02/29/2012   K 4.7 02/29/2012   CL 100 02/29/2012   CREATININE 1.1 02/29/2012   BUN 23 02/29/2012   CO2 29 02/29/2012   TSH 3.09 02/29/2012   INR 1.02 12/25/2010         Assessment & Plan:

## 2012-06-03 ENCOUNTER — Other Ambulatory Visit: Payer: Self-pay | Admitting: Internal Medicine

## 2012-06-03 ENCOUNTER — Other Ambulatory Visit (INDEPENDENT_AMBULATORY_CARE_PROVIDER_SITE_OTHER): Payer: Medicare Other

## 2012-06-03 DIAGNOSIS — I10 Essential (primary) hypertension: Secondary | ICD-10-CM

## 2012-06-03 DIAGNOSIS — R109 Unspecified abdominal pain: Secondary | ICD-10-CM

## 2012-06-03 LAB — CBC WITH DIFFERENTIAL/PLATELET
Basophils Relative: 0.6 % (ref 0.0–3.0)
Eosinophils Relative: 9.7 % — ABNORMAL HIGH (ref 0.0–5.0)
Hemoglobin: 11 g/dL — ABNORMAL LOW (ref 12.0–15.0)
Lymphocytes Relative: 17.6 % (ref 12.0–46.0)
Monocytes Relative: 10.2 % (ref 3.0–12.0)
Neutro Abs: 3.9 10*3/uL (ref 1.4–7.7)
RBC: 3.55 Mil/uL — ABNORMAL LOW (ref 3.87–5.11)
WBC: 6.3 10*3/uL (ref 4.5–10.5)

## 2012-06-03 LAB — COMPREHENSIVE METABOLIC PANEL
Albumin: 3.5 g/dL (ref 3.5–5.2)
BUN: 26 mg/dL — ABNORMAL HIGH (ref 6–23)
CO2: 30 mEq/L (ref 19–32)
Calcium: 8.8 mg/dL (ref 8.4–10.5)
Chloride: 101 mEq/L (ref 96–112)
Creatinine, Ser: 1.3 mg/dL — ABNORMAL HIGH (ref 0.4–1.2)
GFR: 42.79 mL/min — ABNORMAL LOW (ref >60.00)

## 2012-06-26 ENCOUNTER — Encounter (INDEPENDENT_AMBULATORY_CARE_PROVIDER_SITE_OTHER): Payer: Medicare Other

## 2012-06-26 DIAGNOSIS — I6529 Occlusion and stenosis of unspecified carotid artery: Secondary | ICD-10-CM

## 2012-06-30 ENCOUNTER — Telehealth: Payer: Self-pay | Admitting: *Deleted

## 2012-06-30 NOTE — Telephone Encounter (Signed)
She doesn't speak English Thx

## 2012-06-30 NOTE — Telephone Encounter (Signed)
Would this patient benefit from [facility] care management?

## 2012-07-08 ENCOUNTER — Encounter: Payer: Self-pay | Admitting: Cardiology

## 2012-07-31 ENCOUNTER — Ambulatory Visit (INDEPENDENT_AMBULATORY_CARE_PROVIDER_SITE_OTHER): Payer: Medicare Other | Admitting: Internal Medicine

## 2012-07-31 ENCOUNTER — Encounter: Payer: Self-pay | Admitting: Internal Medicine

## 2012-07-31 VITALS — BP 158/90 | HR 76 | Temp 96.6°F | Resp 16 | Wt 139.0 lb

## 2012-07-31 DIAGNOSIS — I959 Hypotension, unspecified: Secondary | ICD-10-CM

## 2012-07-31 DIAGNOSIS — N259 Disorder resulting from impaired renal tubular function, unspecified: Secondary | ICD-10-CM

## 2012-07-31 DIAGNOSIS — F329 Major depressive disorder, single episode, unspecified: Secondary | ICD-10-CM

## 2012-07-31 DIAGNOSIS — F411 Generalized anxiety disorder: Secondary | ICD-10-CM

## 2012-07-31 DIAGNOSIS — E538 Deficiency of other specified B group vitamins: Secondary | ICD-10-CM

## 2012-07-31 MED ORDER — METHADONE HCL 10 MG PO TABS: 10.0000 mg | ORAL_TABLET | Freq: Every day | ORAL | Status: DC | PRN

## 2012-07-31 MED ORDER — FLUOXETINE HCL 10 MG PO TABS: 10.0000 mg | ORAL_TABLET | Freq: Every day | ORAL | Status: DC

## 2012-07-31 NOTE — Assessment & Plan Note (Signed)
Continue with current prescription therapy as reflected on the Med list.  

## 2012-07-31 NOTE — Progress Notes (Signed)
Subjective:    HPI      The patient is here to follow up on chronic paranoid depression, anxiety,  and chronic moderate rectal pain symptoms controlled with medicines some. C/o chronic severe abd pain. C/o spasms in legs and arms at night; not sleeping  BP Readings from Last 3 Encounters:  07/31/12 158/90  06/02/12 102/60  03/31/12 136/80   Wt Readings from Last 3 Encounters:  07/31/12 139 lb (63.05 kg)  06/02/12 129 lb (58.514 kg)  03/31/12 130 lb (58.968 kg)     BP 158/90  Pulse 76  Temp(Src) 96.6 F (35.9 C) (Oral)  Resp 16  Wt 139 lb (63.05 kg)  BMI 23.85 kg/m2  Past Medical History  Diagnosis Date  . CAD (coronary artery disease)     Catheterization, January, 2007 patent grafts / hospitalization October 2 010 no MI  . Syncope 03/2009    ?orthostasis  . Allergic rhinitis   . History of colon polyps   . Diverticulosis of colon   . GERD (gastroesophageal reflux disease)   . Hyperlipidemia   . Abdominal  pain, other specified site     motility disorder, chorinic functional, severe (Dr Juanda Chance). Possible OCD w/complusive use of enemas and laxatives; psychotic features 2011  . Positive H. pylori test 2911  . Psychosomatic disorder     w/GI fixation  . Anxiety   . Depression   . Stress incontinence, female   . Vitamin D deficiency   . Vitamin B12 deficiency   . Renal insufficiency   . UTI (urinary tract infection)   . Hypokalemia     mild  . Insomnia   . Carotid artery disease     Doppler November, 1610, 0-39% LICA, 60-79% R. ICA.Marland Kitchen increased velocity... possibly from serpentine vessel  . Ejection fraction     50-55%, no wall motion abnormalities, echo, November, 2010  . Edema   . Hemothorax on right   . Breast cancer 2011    Dr Welton Flakes- right breast   Past Surgical History  Procedure Laterality Date  . Coronary artery bypass graft    . Myomectomy      Uterine and ovarian  . Breast fibroadenoma surgery    . Hemicolectomy  2005    Left  . Breast  lumpectomy  2011  . Right vats (video-assisted thoracoscopic surgery) with      reports that she has never smoked. She does not have any smokeless tobacco history on file. She reports that she does not drink alcohol or use illicit drugs. family history includes Arthritis in her mother; Colon cancer in her mother; Coronary artery disease in an unspecified family member; and Heart disease in her father and mother. Allergies  Allergen Reactions  . Baclofen     REACTION: confusion  . Belladonna   . Ciprofloxacin   . Doxycycline   . Erythromycin   . Gabapentin   . Iohexol      Code: RASH   . Metoclopramide     Clonic spasms  . Metronidazole     REACTION: rash  . Nalbuphine   . Nitrofurantoin     REACTION: nausea  . Penicillins   . Procaine Hcl   . Sucralfate     REACTION: nausea  . Sulfonamide Derivatives   . Methenamine Rash   Current Outpatient Prescriptions on File Prior to Visit  Medication Sig Dispense Refill  . anastrozole (ARIMIDEX) 1 MG tablet Take 1 tablet (1 mg total) by mouth daily.  90 tablet  3  . clotrimazole-betamethasone (LOTRISONE) cream Apply topically 2 (two) times daily.  90 g  3  . conjugated estrogens (PREMARIN) vaginal cream Place 0.5 Applicatorfuls vaginally 2 (two) times a week.  42.5 g  5  . [employer] ALLERGY 25 MG capsule TAKE ONE CAPSULE BY MOUTH EVERY 6 HOURS AS NEEDED ITCHING  60 capsule  1  . diphenhydrAMINE (BENADRYL) 25 mg capsule Take 1 capsule (25 mg total) by mouth every 6 (six) hours as needed for itching.  60 capsule  5  . divalproex (DEPAKOTE) 250 MG DR tablet Take 1 tablet (250 mg total) by mouth 2 (two) times daily.  60 tablet  11  . DULoxetine (CYMBALTA) 30 MG capsule Take 1 capsule (30 mg total) by mouth daily.  90 capsule  3  . indapamide (LOZOL) 2.5 MG tablet Take 1 tablet (2.5 mg total) by mouth every morning.  90 tablet  3  . KLOR-CON M10 10 MEQ tablet TAKE 1 TABLET BY MOUTH 2 TIMES A DAY  60 tablet  5  . Lactulose 20 GM/30ML SOLN Take  30-60 mLs by mouth as needed.        . loratadine (CLARITIN) 10 MG tablet Take 1 tablet (10 mg total) by mouth daily.  100 tablet  3  . LORazepam (ATIVAN) 1 MG tablet Take 1 tablet (1 mg total) by mouth every 8 (eight) hours as needed for anxiety.  270 tablet  1  . metoCLOPramide (REGLAN) 5 MG tablet TAKE 1 TABLET BY MOUTH 3 TIMES A DAY BEFORE MEALS  90 tablet  1  . metoprolol succinate (TOPROL-XL) 25 MG 24 hr tablet Take 1 tablet (25 mg total) by mouth daily.  90 tablet  3  . mometasone (ELOCON) 0.1 % ointment Apply topically 2 (two) times daily. Apply to vaginal area  45 g  1  . omeprazole (PRILOSEC) 20 MG capsule Take 1 capsule (20 mg total) by mouth 2 (two) times daily.  180 capsule  3  . Pancrelipase, Lip-Prot-Amyl, (CREON) 24000 UNITS CPEP Take 1 capsule (24,000 Units total) by mouth 3 (three) times daily with meals.  90 capsule  5  . promethazine (PHENERGAN) 25 MG tablet Take 1 tablet (25 mg total) by mouth every 8 (eight) hours as needed for nausea.  60 tablet  5  . Tamsulosin HCl (FLOMAX) 0.4 MG CAPS Take 1 capsule (0.4 mg total) by mouth daily.  90 capsule  3  . trimethoprim (TRIMPEX) 100 MG tablet Take 1 tablet (100 mg total) by mouth daily.  90 tablet  3  . Vitamin D, Ergocalciferol, (DRISDOL) 50000 UNITS CAPS Take 1 capsule (50,000 Units total) by mouth every 30 (thirty) days.  6 capsule  1  . zolpidem (AMBIEN) 10 MG tablet TAKE 1 TABLET AT BEDTIME AS NEEDED  90 tablet  1   No current facility-administered medications on file prior to visit.      Review of Systems  Constitutional: Positive for fatigue. Negative for activity change, appetite change and unexpected weight change.  HENT: Negative for congestion, mouth sores and sinus pressure.   Eyes: Negative for visual disturbance.  Respiratory: Negative for cough and chest tightness.   Gastrointestinal: Positive for constipation. Negative for abdominal pain, diarrhea and blood in stool.  Genitourinary: Negative for difficulty  urinating and vaginal pain.  Musculoskeletal: Negative for back pain and gait problem.  Skin: Negative for pallor and rash.  Neurological: Negative for dizziness, tremors, weakness, numbness and headaches.  Psychiatric/Behavioral: Positive for behavioral problems, sleep  disturbance, dysphoric mood and decreased concentration. Negative for suicidal ideas, hallucinations, confusion and agitation. The patient is nervous/anxious.        Objective:   Physical Exam  Constitutional: She appears well-developed. No distress.  Chron ill appearing  HENT:  Head: Normocephalic.  Right Ear: External ear normal.  Left Ear: External ear normal.  Nose: Nose normal.  Mouth/Throat: Oropharynx is clear and moist.  Eyes: Conjunctivae are normal. Pupils are equal, round, and reactive to light. Right eye exhibits no discharge. Left eye exhibits no discharge.  Neck: Normal range of motion. Neck supple. No JVD present. No tracheal deviation present. No thyromegaly present.  Cardiovascular: Normal rate, regular rhythm and normal heart sounds.   Pulmonary/Chest: No stridor. No respiratory distress. She has no wheezes.  Abdominal: Soft. Bowel sounds are normal. She exhibits no distension and no mass. There is tenderness (mild). There is no rebound and no guarding.  Musculoskeletal: She exhibits no edema and no tenderness.  Lymphadenopathy:    She has no cervical adenopathy.  Neurological: She displays normal reflexes. No cranial nerve deficit. She exhibits normal muscle tone. Coordination normal.  Skin: No rash noted. No erythema.  Psychiatric: Her behavior is normal. Thought content normal.  depressed     Lab Results  Component Value Date   WBC 6.3 06/02/2012   HGB 11.0* 06/02/2012   HCT 33.1* 06/02/2012   PLT 202.0 06/02/2012   GLUCOSE 66* 06/02/2012   CHOL 302* 10/07/2009   TRIG 274.0* 10/07/2009   HDL 51.50 10/07/2009   LDLDIRECT 195.8 10/07/2009   LDLCALC  Value: 280        Total Cholesterol/HDL:CHD Risk  Coronary Heart Disease Risk Table                     Men   Women  1/2 Average Risk   3.4   3.3  Average Risk       5.0   4.4  2 X Average Risk   9.6   7.1  3 X Average Risk  23.4   11.0        Use the calculated Patient Ratio above and the CHD Risk Table to determine the patient's CHD Risk.        ATP III CLASSIFICATION (LDL):  <100     mg/dL   Optimal  161-096  mg/dL   Near or Above                    Optimal  130-159  mg/dL   Borderline  045-409  mg/dL   High  >811     mg/dL   Very High* 91/08/7827   ALT 17 06/02/2012   AST 24 06/02/2012   NA 135 06/02/2012   K 4.4 06/02/2012   CL 101 06/02/2012   CREATININE 1.3* 06/02/2012   BUN 26* 06/02/2012   CO2 30 06/02/2012   TSH 3.09 02/29/2012   INR 1.02 12/25/2010         Assessment & Plan:

## 2012-07-31 NOTE — Assessment & Plan Note (Signed)
Monitoring

## 2012-07-31 NOTE — Assessment & Plan Note (Signed)
BP Readings from Last 3 Encounters:  07/31/12 158/90  06/02/12 102/60  03/31/12 136/80

## 2012-07-31 NOTE — Assessment & Plan Note (Signed)
Continue with current prescription therapy as reflected on the Med list. Doing ok.

## 2012-08-04 ENCOUNTER — Other Ambulatory Visit: Payer: Self-pay | Admitting: Internal Medicine

## 2012-08-04 DIAGNOSIS — Z853 Personal history of malignant neoplasm of breast: Secondary | ICD-10-CM

## 2012-08-15 ENCOUNTER — Telehealth: Payer: Self-pay | Admitting: *Deleted

## 2012-08-15 NOTE — Telephone Encounter (Signed)
Promethazine PA is approved 05/14/2012-08/12/2013.

## 2012-08-29 ENCOUNTER — Other Ambulatory Visit: Payer: Self-pay | Admitting: Medical Oncology

## 2012-08-29 DIAGNOSIS — Z853 Personal history of malignant neoplasm of breast: Secondary | ICD-10-CM

## 2012-09-01 ENCOUNTER — Ambulatory Visit (HOSPITAL_BASED_OUTPATIENT_CLINIC_OR_DEPARTMENT_OTHER): Payer: Medicare Other | Admitting: Oncology

## 2012-09-01 ENCOUNTER — Other Ambulatory Visit (HOSPITAL_BASED_OUTPATIENT_CLINIC_OR_DEPARTMENT_OTHER): Payer: Medicare Other | Admitting: Lab

## 2012-09-01 ENCOUNTER — Encounter: Payer: Self-pay | Admitting: Oncology

## 2012-09-01 ENCOUNTER — Telehealth: Payer: Self-pay | Admitting: *Deleted

## 2012-09-01 VITALS — BP 138/74 | HR 74 | Temp 97.8°F | Resp 20 | Ht 64.0 in | Wt 138.8 lb

## 2012-09-01 DIAGNOSIS — C50219 Malignant neoplasm of upper-inner quadrant of unspecified female breast: Secondary | ICD-10-CM

## 2012-09-01 DIAGNOSIS — Z170 Estrogen receptor positive status [ER+]: Secondary | ICD-10-CM

## 2012-09-01 DIAGNOSIS — Z853 Personal history of malignant neoplasm of breast: Secondary | ICD-10-CM

## 2012-09-01 LAB — COMPREHENSIVE METABOLIC PANEL (CC13)
ALT: 12 U/L (ref 0–55)
CO2: 26 mEq/L (ref 22–29)
Calcium: 8.9 mg/dL (ref 8.4–10.4)
Chloride: 106 mEq/L (ref 98–107)
Creatinine: 1.4 mg/dL — ABNORMAL HIGH (ref 0.6–1.1)
Glucose: 97 mg/dl (ref 70–99)
Sodium: 140 mEq/L (ref 136–145)
Total Protein: 6.6 g/dL (ref 6.4–8.3)

## 2012-09-01 LAB — CBC WITH DIFFERENTIAL/PLATELET
BASO%: 0.3 % (ref 0.0–2.0)
Eosinophils Absolute: 0.7 10*3/uL — ABNORMAL HIGH (ref 0.0–0.5)
HCT: 32.8 % — ABNORMAL LOW (ref 34.8–46.6)
HGB: 11.1 g/dL — ABNORMAL LOW (ref 11.6–15.9)
MCHC: 33.7 g/dL (ref 31.5–36.0)
MONO#: 0.7 10*3/uL (ref 0.1–0.9)
NEUT#: 5 10*3/uL (ref 1.5–6.5)
NEUT%: 65.6 % (ref 38.4–76.8)
Platelets: 169 10*3/uL (ref 145–400)
WBC: 7.6 10*3/uL (ref 3.9–10.3)
lymph#: 1.1 10*3/uL (ref 0.9–3.3)

## 2012-09-01 NOTE — Patient Instructions (Addendum)
Continue arimidex 1 mg daily  I will see you back in 1 year

## 2012-09-01 NOTE — Telephone Encounter (Signed)
appts made and printed...td 

## 2012-09-01 NOTE — Progress Notes (Signed)
OFFICE PROGRESS NOTE  CC  Sandy Primes, MD [address] [facility] [address] [place] [address] Dr. 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  #2 patient was then begun on Arimidex 1 mg daily starting in January 2012.  CURRENT THERAPY:Arimidex 1 mg daily  INTERVAL HISTORY: [name] 79 y.o. female returns for followup visit at 6 months. Clinically she is doing well she has been very compliant with her therapy. She is taking it on a very consistent and daily basis. She does palpate her breasts are most every day. She is noticing normal breast tissue there are no palpable masses. She denies any hot flashes vaginal bleeding or discharge no myalgias or arthralgias. She has not had any recent hospitalizations. Patient did sustain a fall a few weeks ago. She has bruising on her left cheek and eyes. Remainder of the 10 point review of systems is negative. MEDICAL HISTORY: Past Medical History  Diagnosis Date  . CAD (coronary artery disease)     Catheterization, January, 2007 patent grafts / hospitalization October 2 010 no MI  . Syncope 03/2009    ?orthostasis  . Allergic rhinitis   . History of colon polyps   . Diverticulosis of colon   . GERD (gastroesophageal reflux disease)   . Hyperlipidemia   . Abdominal  pain, other specified site     motility disorder, chorinic functional, severe (Dr Juanda Chance). Possible OCD w/complusive use of enemas and laxatives; psychotic features 2011  . Positive H. pylori test 2911  . Psychosomatic disorder     w/GI fixation  . Anxiety   . Depression   . Stress incontinence, female   . Vitamin D deficiency   . Vitamin B12 deficiency   . Renal insufficiency   . UTI (urinary tract  infection)   . Hypokalemia     mild  . Insomnia   . Carotid artery disease     Doppler November, 6295, 0-39% LICA, 60-79% R. ICA.Marland Kitchen increased velocity... possibly from serpentine vessel  . Ejection fraction     50-55%, no wall motion abnormalities, echo, November, 2010  . Edema   . Hemothorax on right   . Breast cancer 2011    Dr Welton Flakes- right breast    ALLERGIES:  is allergic to baclofen; belladonna; ciprofloxacin; doxycycline; erythromycin; gabapentin; iohexol; metoclopramide; metronidazole; nalbuphine; nitrofurantoin; penicillins; procaine hcl; sucralfate; sulfonamide derivatives; and methenamine.  MEDICATIONS:  Current Outpatient Prescriptions  Medication Sig Dispense Refill  . anastrozole (ARIMIDEX) 1 MG tablet Take 1 tablet (1 mg total) by mouth daily.  90 tablet  3  . clotrimazole-betamethasone (LOTRISONE) cream Apply topically 2 (two) times daily.  90 g  3  . conjugated estrogens (PREMARIN) vaginal cream Place 0.5 Applicatorfuls vaginally 2 (two) times a week.  42.5 g  5  . [employer] ALLERGY 25 MG capsule TAKE ONE CAPSULE BY MOUTH EVERY 6 HOURS AS NEEDED ITCHING  60 capsule  1  . diphenhydrAMINE (BENADRYL) 25 mg capsule Take 1 capsule (25 mg total) by mouth every 6 (six) hours as needed for itching.  60 capsule  5  . divalproex (DEPAKOTE) 250 MG DR tablet Take 1 tablet (250 mg total) by mouth 2 (two) times daily.  60 tablet  11  . DULoxetine (CYMBALTA)  30 MG capsule Take 1 capsule (30 mg total) by mouth daily.  90 capsule  3  . FLUoxetine (PROZAC) 10 MG tablet Take 1 tablet (10 mg total) by mouth daily.  30 tablet  6  . indapamide (LOZOL) 2.5 MG tablet Take 1 tablet (2.5 mg total) by mouth every morning.  90 tablet  3  . KLOR-CON M10 10 MEQ tablet TAKE 1 TABLET BY MOUTH 2 TIMES A DAY  60 tablet  5  . Lactulose 20 GM/30ML SOLN Take 30-60 mLs by mouth as needed.        . loratadine (CLARITIN) 10 MG tablet Take 1 tablet (10 mg total) by mouth daily.  100 tablet  3  . LORazepam (ATIVAN) 1  MG tablet Take 1 tablet (1 mg total) by mouth every 8 (eight) hours as needed for anxiety.  270 tablet  1  . methadone (DOLOPHINE) 10 MG tablet Take 1 tablet (10 mg total) by mouth 5 (five) times daily as needed. Fill on or after 08/29/12. Once every 30 days  150 tablet  0  . metoCLOPramide (REGLAN) 5 MG tablet TAKE 1 TABLET BY MOUTH 3 TIMES A DAY BEFORE MEALS  90 tablet  1  . metoprolol succinate (TOPROL-XL) 25 MG 24 hr tablet Take 1 tablet (25 mg total) by mouth daily.  90 tablet  3  . mometasone (ELOCON) 0.1 % ointment Apply topically 2 (two) times daily. Apply to vaginal area  45 g  1  . omeprazole (PRILOSEC) 20 MG capsule Take 1 capsule (20 mg total) by mouth 2 (two) times daily.  180 capsule  3  . Pancrelipase, Lip-Prot-Amyl, (CREON) 24000 UNITS CPEP Take 1 capsule (24,000 Units total) by mouth 3 (three) times daily with meals.  90 capsule  5  . promethazine (PHENERGAN) 25 MG tablet Take 1 tablet (25 mg total) by mouth every 8 (eight) hours as needed for nausea.  60 tablet  5  . Tamsulosin HCl (FLOMAX) 0.4 MG CAPS Take 1 capsule (0.4 mg total) by mouth daily.  90 capsule  3  . trimethoprim (TRIMPEX) 100 MG tablet Take 1 tablet (100 mg total) by mouth daily.  90 tablet  3  . Vitamin D, Ergocalciferol, (DRISDOL) 50000 UNITS CAPS Take 1 capsule (50,000 Units total) by mouth every 30 (thirty) days.  6 capsule  1  . zolpidem (AMBIEN) 10 MG tablet TAKE 1 TABLET AT BEDTIME AS NEEDED  90 tablet  1   No current facility-administered medications for this visit.    SURGICAL HISTORY:  Past Surgical History  Procedure Laterality Date  . Coronary artery bypass graft    . Myomectomy      Uterine and ovarian  . Breast fibroadenoma surgery    . Hemicolectomy  2005    Left  . Breast lumpectomy  2011  . Right vats (video-assisted thoracoscopic surgery) with      REVIEW OF SYSTEMS:  Pertinent items are noted in HPI.   PHYSICAL EXAMINATION: General appearance: alert, cooperative and appears stated  age Lymph nodes: Cervical, supraclavicular, and axillary nodes normal. Resp: clear to auscultation bilaterally and normal percussion bilaterally Back: symmetric, no curvature. ROM normal. No CVA tenderness. Cardio: regular rate and rhythm, S1, S2 normal, no murmur, click, rub or gallop GI: soft, non-tender; bowel sounds normal; no masses,  no organomegaly Extremities: extremities normal, atraumatic, no cyanosis or edema Neurologic: Alert and oriented X 3, normal strength and tone. Normal symmetric reflexes. Normal coordination and gait Bilateral breast examination: Bilateral  breast exam is performed there are no masses nipple discharge no axillary lymphadenopathy is noted in either of the breasts. ECOG PERFORMANCE STATUS: 1 - Symptomatic but completely ambulatory  Blood pressure 138/74, pulse 74, temperature 97.8 F (36.6 C), temperature source Oral, resp. rate 20, height 5\' 4"  (1.626 m), weight 138 lb 12.8 oz (62.959 kg).  LABORATORY DATA: Lab Results  Component Value Date   WBC 7.6 09/01/2012   HGB 11.1* 09/01/2012   HCT 32.8* 09/01/2012   MCV 94.6 09/01/2012   PLT 169 09/01/2012      Chemistry      Component Value Date/Time   NA 140 09/01/2012 1112   NA 135 06/02/2012 1000   K 4.2 09/01/2012 1112   K 4.4 06/02/2012 1000   CL 106 09/01/2012 1112   CL 101 06/02/2012 1000   CO2 26 09/01/2012 1112   CO2 30 06/02/2012 1000   BUN 22.1 09/01/2012 1112   BUN 26* 06/02/2012 1000   CREATININE 1.4* 09/01/2012 1112   CREATININE 1.3* 06/02/2012 1000      Component Value Date/Time   CALCIUM 8.9 09/01/2012 1112   CALCIUM 8.8 06/02/2012 1000   ALKPHOS 160* 09/01/2012 1112   ALKPHOS 103 06/02/2012 1000   AST 17 09/01/2012 1112   AST 24 06/02/2012 1000   ALT 12 09/01/2012 1112   ALT 17 06/02/2012 1000   BILITOT 0.34 09/01/2012 1112   BILITOT 0.4 06/02/2012 1000       RADIOGRAPHIC STUDIES:  No results found.  ASSESSMENT: 79 year old female with  #1 stage I invasive ductal carcinoma of the left  breast status post lumpectomy in December 2011. This was then followed by Arimidex 1 mg daily starting in January 2012. Overall she is doing well she has no evidence of recurrent disease.   PLAN:   #1 patient will continue Arimidex on a daily basis overall she is doing well with it.  #2 clinically patient is doing well she will be seen back in 12 months time . All questions were answered. The patient knows to call the clinic with any problems, questions or concerns. We can certainly see the patient much sooner if necessary.  I spent 15 minutes counseling the patient face to face. The total time spent in the appointment was 30 minutes.    Drue Second, MD Medical/Oncology [facility] [phone] (beeper) [phone] (Office)  09/01/2012, 12:10 PM

## 2012-09-04 ENCOUNTER — Ambulatory Visit
Admission: RE | Admit: 2012-09-04 | Discharge: 2012-09-04 | Disposition: A | Discharge status: Home / Self Care | Payer: Medicare Other | Source: Ambulatory Visit | Attending: Internal Medicine | Admitting: Internal Medicine

## 2012-09-04 DIAGNOSIS — Z853 Personal history of malignant neoplasm of breast: Secondary | ICD-10-CM

## 2012-09-09 ENCOUNTER — Encounter: Payer: Self-pay | Admitting: Cardiology

## 2012-09-10 ENCOUNTER — Encounter: Payer: Self-pay | Admitting: Cardiology

## 2012-09-10 ENCOUNTER — Ambulatory Visit (INDEPENDENT_AMBULATORY_CARE_PROVIDER_SITE_OTHER): Payer: Medicare Other | Admitting: Cardiology

## 2012-09-10 VITALS — BP 121/69 | HR 101 | Ht 65.0 in | Wt 133.0 lb

## 2012-09-10 DIAGNOSIS — I2510 Atherosclerotic heart disease of native coronary artery without angina pectoris: Secondary | ICD-10-CM

## 2012-09-10 DIAGNOSIS — I959 Hypotension, unspecified: Secondary | ICD-10-CM

## 2012-09-10 DIAGNOSIS — I779 Disorder of arteries and arterioles, unspecified: Secondary | ICD-10-CM

## 2012-09-10 DIAGNOSIS — R0989 Other specified symptoms and signs involving the circulatory and respiratory systems: Secondary | ICD-10-CM

## 2012-09-10 DIAGNOSIS — R9430 Abnormal result of cardiovascular function study, unspecified: Secondary | ICD-10-CM

## 2012-09-10 NOTE — Assessment & Plan Note (Signed)
Blood pressure today is stable. No change in therapy.

## 2012-09-10 NOTE — Assessment & Plan Note (Signed)
Carotid disease is being followed very carefully. No change in therapy.

## 2012-09-10 NOTE — Progress Notes (Signed)
HPI  Patient is seen today for cardiology followup. She has known coronary disease. She's post CABG in the past. She has carotid artery disease. This is being followed carefully with Dopplers. She fell and injured the left side of her face. This has recovered. Her husband was with her and she did not have syncope.  Allergies  Allergen Reactions  . Baclofen     REACTION: confusion  . Belladonna   . Ciprofloxacin   . Doxycycline   . Erythromycin   . Gabapentin   . Iohexol      Code: RASH   . Metoclopramide     Clonic spasms  . Metronidazole     REACTION: rash  . Nalbuphine   . Nitrofurantoin     REACTION: nausea  . Penicillins   . Procaine Hcl   . Sucralfate     REACTION: nausea  . Sulfonamide Derivatives   . Methenamine Rash    Current Outpatient Prescriptions  Medication Sig Dispense Refill  . anastrozole (ARIMIDEX) 1 MG tablet Take 1 tablet (1 mg total) by mouth daily.  90 tablet  3  . clotrimazole-betamethasone (LOTRISONE) cream Apply topically 2 (two) times daily.  90 g  3  . conjugated estrogens (PREMARIN) vaginal cream Place 0.5 Applicatorfuls vaginally 2 (two) times a week.  42.5 g  5  . [employer]S ALLERGY 25 MG capsule TAKE ONE CAPSULE BY MOUTH EVERY 6 HOURS AS NEEDED ITCHING  60 capsule  1  . diphenhydrAMINE (BENADRYL) 25 mg capsule Take 1 capsule (25 mg total) by mouth every 6 (six) hours as needed for itching.  60 capsule  5  . divalproex (DEPAKOTE) 250 MG DR tablet Take 1 tablet (250 mg total) by mouth 2 (two) times daily.  60 tablet  11  . DULoxetine (CYMBALTA) 30 MG capsule Take 1 capsule (30 mg total) by mouth daily.  90 capsule  3  . FLUoxetine (PROZAC) 10 MG tablet Take 1 tablet (10 mg total) by mouth daily.  30 tablet  6  . indapamide (LOZOL) 2.5 MG tablet Take 1 tablet (2.5 mg total) by mouth every morning.  90 tablet  3  . KLOR-CON M10 10 MEQ tablet TAKE 1 TABLET BY MOUTH 2 TIMES A DAY  60 tablet  5  . Lactulose 20 GM/30ML SOLN Take 30-60 mLs by mouth as  needed.        . loratadine (CLARITIN) 10 MG tablet Take 1 tablet (10 mg total) by mouth daily.  100 tablet  3  . LORazepam (ATIVAN) 1 MG tablet Take 1 tablet (1 mg total) by mouth every 8 (eight) hours as needed for anxiety.  270 tablet  1  . methadone (DOLOPHINE) 10 MG tablet Take 1 tablet (10 mg total) by mouth 5 (five) times daily as needed. Fill on or after 08/29/12. Once every 30 days  150 tablet  0  . metoCLOPramide (REGLAN) 5 MG tablet TAKE 1 TABLET BY MOUTH 3 TIMES A DAY BEFORE MEALS  90 tablet  1  . metoprolol succinate (TOPROL-XL) 25 MG 24 hr tablet Take 1 tablet (25 mg total) by mouth daily.  90 tablet  3  . omeprazole (PRILOSEC) 20 MG capsule Take 1 capsule (20 mg total) by mouth 2 (two) times daily.  180 capsule  3  . Pancrelipase, Lip-Prot-Amyl, (CREON) 24000 UNITS CPEP Take 1 capsule (24,000 Units total) by mouth 3 (three) times daily with meals.  90 capsule  5  . promethazine (PHENERGAN) 25 MG tablet Take  1 tablet (25 mg total) by mouth every 8 (eight) hours as needed for nausea.  60 tablet  5  . Tamsulosin HCl (FLOMAX) 0.4 MG CAPS Take 1 capsule (0.4 mg total) by mouth daily.  90 capsule  3  . trimethoprim (TRIMPEX) 100 MG tablet Take 1 tablet (100 mg total) by mouth daily.  90 tablet  3  . Vitamin D, Ergocalciferol, (DRISDOL) 50000 UNITS CAPS Take 1 capsule (50,000 Units total) by mouth every 30 (thirty) days.  6 capsule  1  . zolpidem (AMBIEN) 10 MG tablet TAKE 1 TABLET AT BEDTIME AS NEEDED  90 tablet  1   No current facility-administered medications for this visit.    History   Social History  . Marital Status: Married    Spouse Name: N/A    Number of Children: N/A  . Years of Education: N/A   Occupational History  . RETIRED MD    Social History Main Topics  . Smoking status: Never Smoker   . Smokeless tobacco: Not on file  . Alcohol Use: No  . Drug Use: No  . Sexually Active: No   Other Topics Concern  . Not on file   Social History Narrative   Patient is  an immigrant from [place]. 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       Currently living w/her husband.     Family History  Problem Relation Age of Onset  . Coronary artery disease      Female 1st degree relative <60  . Colon cancer Mother   . Arthritis Mother   . Heart disease Mother   . Heart disease Father     Past Medical History  Diagnosis Date  . CAD (coronary artery disease)     Catheterization, January, 2007 patent grafts / hospitalization October 2 010 no MI  . Syncope 03/2009    ?orthostasis  . Allergic rhinitis   . History of colon polyps   . Diverticulosis of colon   . GERD (gastroesophageal reflux disease)   . Hyperlipidemia   . Abdominal  pain, other specified site     motility disorder, chorinic functional, severe (Dr Juanda Chance). Possible OCD w/complusive use of enemas and laxatives; psychotic features 2011  . Positive H. pylori test 2911  . Psychosomatic disorder     w/GI fixation  . Anxiety   . Depression   . Stress incontinence, female   . Vitamin D deficiency   . Vitamin B12 deficiency   . Renal insufficiency   . UTI (urinary tract infection)   . Hypokalemia     mild  . Insomnia   . Carotid artery disease     Doppler November, 1610, 0-39% LICA, 60-79% R. ICA.Marland Kitchen increased velocity... possibly from serpentine vessel  . Ejection fraction     50-55%, no wall motion abnormalities, echo, November, 2010  . Edema   . Hemothorax on right   . Breast cancer 2011    Dr Welton Flakes- right breast    Past Surgical History  Procedure Laterality Date  . Coronary artery bypass graft    . Myomectomy      Uterine and ovarian  . Breast fibroadenoma surgery    . Hemicolectomy  2005    Left  . Breast lumpectomy  2011  . Right vats (video-assisted thoracoscopic surgery) with      Patient Active Problem List   Diagnosis Date Noted  . Vaginitis and vulvovaginitis 09/06/2011  .  Spasm 08/14/2011  . Anemia, iron  deficiency 01/26/2011  . Hemothorax on right   . Edema   . CAD (coronary artery disease)   . Hx of CABG   . Carotid artery disease   . Ejection fraction   . BREAST CANCER, HX OF 04/17/2010  . LUMP OR MASS IN BREAST 03/27/2010  . INSOMNIA, CHRONIC 11/08/2009  . HYPONATREMIA 10/26/2009  . PSYCHOSIS 10/26/2009  . OBSESSIVE-COMPULSIVE DISORDER 10/26/2009  . FREQUENCY, URINARY 10/26/2009  . HELICOBACTER PYLORI INFECTION 08/31/2009  . DYSPHAGIA UNSPECIFIED 07/07/2009  . Syncope 03/07/2009  . URINARY RETENTION 12/17/2008  . HYPOTENSION 10/19/2008  . CHEST PAIN, SUBSTERNAL 08/13/2008  . LEG PAIN 04/16/2008  . RENAL INSUFFICIENCY 03/30/2008  . CYSTITIS 01/23/2008  . ABDOMINAL PAIN, RIGHT UPPER QUADRANT 10/03/2007  . HYPOKALEMIA 08/27/2007  . Chronic pain syndrome 08/08/2007  . TROCHANTERIC BURSITIS 08/08/2007  . SINUS BRADYCARDIA 03/31/2007  . ANXIETY 03/02/2007  . DEPRESSION 03/02/2007  . SYMPTOM, MEMORY LOSS 02/27/2007  . DEPENDENCE, DRUG NOS, CONTINUOUS 01/28/2007  . B12 DEFICIENCY 11/27/2006  . Unspecified Vitamin D Deficiency 11/27/2006  . HYPERLIPIDEMIA 11/27/2006  . MYOCARDIAL INFARCTION, HX OF 11/27/2006  . ALLERGIC RHINITIS 11/27/2006  . GERD 11/27/2006  . DIVERTICULOSIS, COLON 11/27/2006  . PRURITUS 11/27/2006  . URTICARIA 11/27/2006  . STRESS INCONTINENCE 11/27/2006  . ABDOMINAL PAIN, CHRONIC 11/27/2006  . COLONIC POLYPS, HX OF 11/27/2006  . BENIGN NEOPLASM OF COLON 08/26/2003  . HIATAL HERNIA 08/26/2003    ROS   Patient denies fever, chills, headache, sweats, rash, change in vision, change in hearing, chest pain, cough, nausea vomiting, urinary symptoms. All other systems are reviewed and are negative. Review of systems was done through the interpreter.  PHYSICAL EXAM  Patient is oriented to person. Communication is through the interpreter and her husband. She appears stable. There is no jugulovenous distention. Lungs are clear. Respiratory effort is nonlabored.  Cardiac exam reveals S1 and S2. The abdomen is soft. There is no peripheral edema.  Filed Vitals:   09/10/12 0853  BP: 121/69  Pulse: 101  Height: 5\' 5"  (1.651 m)  Weight: 133 lb (60.328 kg)     ASSESSMENT & PLAN

## 2012-09-10 NOTE — Assessment & Plan Note (Signed)
She has good left Atripla function by history. No further workup needed.

## 2012-09-10 NOTE — Assessment & Plan Note (Signed)
Coronary disease is stable. No change in therapy. 

## 2012-09-10 NOTE — Patient Instructions (Addendum)
Your physician recommends that you continue on your current medications as directed. Please refer to the Current Medication list given to you today.  Your physician wants you to follow-up in: 6 months. You will receive a reminder letter in the mail two months in advance. If you don't receive a letter, please call our office to schedule the follow-up appointment.  

## 2012-09-11 ENCOUNTER — Ambulatory Visit: Payer: Medicare Other | Admitting: Cardiology

## 2012-10-01 ENCOUNTER — Encounter: Payer: Self-pay | Admitting: Internal Medicine

## 2012-10-01 ENCOUNTER — Ambulatory Visit (INDEPENDENT_AMBULATORY_CARE_PROVIDER_SITE_OTHER): Payer: Medicare Other | Admitting: Internal Medicine

## 2012-10-01 VITALS — BP 148/78 | HR 76 | Temp 98.6°F | Resp 16 | Wt 134.0 lb

## 2012-10-01 DIAGNOSIS — R109 Unspecified abdominal pain: Secondary | ICD-10-CM

## 2012-10-01 DIAGNOSIS — E538 Deficiency of other specified B group vitamins: Secondary | ICD-10-CM

## 2012-10-01 DIAGNOSIS — S0083XD Contusion of other part of head, subsequent encounter: Secondary | ICD-10-CM

## 2012-10-01 DIAGNOSIS — I959 Hypotension, unspecified: Secondary | ICD-10-CM

## 2012-10-01 DIAGNOSIS — B079 Viral wart, unspecified: Secondary | ICD-10-CM

## 2012-10-01 DIAGNOSIS — S0083XA Contusion of other part of head, initial encounter: Secondary | ICD-10-CM | POA: Insufficient documentation

## 2012-10-01 DIAGNOSIS — E559 Vitamin D deficiency, unspecified: Secondary | ICD-10-CM

## 2012-10-01 DIAGNOSIS — F429 Obsessive-compulsive disorder, unspecified: Secondary | ICD-10-CM

## 2012-10-01 DIAGNOSIS — Z5189 Encounter for other specified aftercare: Secondary | ICD-10-CM

## 2012-10-01 DIAGNOSIS — G894 Chronic pain syndrome: Secondary | ICD-10-CM

## 2012-10-01 DIAGNOSIS — L570 Actinic keratosis: Secondary | ICD-10-CM

## 2012-10-01 MED ORDER — LORAZEPAM 1 MG PO TABS: 1.0000 mg | ORAL_TABLET | Freq: Three times a day (TID) | ORAL | Status: DC | PRN

## 2012-10-01 MED ORDER — DIVALPROEX SODIUM 250 MG PO DR TAB: 250.0000 mg | DELAYED_RELEASE_TABLET | Freq: Two times a day (BID) | ORAL | Status: DC

## 2012-10-01 MED ORDER — POTASSIUM CHLORIDE CRYS ER 10 MEQ PO TBCR: 10.0000 meq | EXTENDED_RELEASE_TABLET | Freq: Two times a day (BID) | ORAL | Status: DC

## 2012-10-01 MED ORDER — ZOLPIDEM TARTRATE 10 MG PO TABS: 10.0000 mg | ORAL_TABLET | Freq: Every evening | ORAL | Status: DC | PRN

## 2012-10-01 MED ORDER — METHADONE HCL 10 MG PO TABS: 10.0000 mg | ORAL_TABLET | Freq: Every day | ORAL | Status: DC | PRN

## 2012-10-01 MED ORDER — METOPROLOL SUCCINATE ER 25 MG PO TB24: 25.0000 mg | ORAL_TABLET | Freq: Every day | ORAL | Status: DC

## 2012-10-01 NOTE — Assessment & Plan Note (Signed)
Risks associated with treatment noncompliance were discussed. Compliance was encouraged. 

## 2012-10-01 NOTE — Assessment & Plan Note (Signed)
Discussed.

## 2012-10-01 NOTE — Progress Notes (Signed)
Subjective:    HPI  She fell 1 mo ago and bruised L face Off NTG prn    The patient is here to follow up on chronic paranoid depression, anxiety,  and chronic moderate rectal pain symptoms controlled with medicines some. C/o chronic severe abd pain. C/o spasms in legs and arms at night; not sleeping  Wt Readings from Last 3 Encounters:  10/01/12 134 lb (60.782 kg)  09/10/12 133 lb (60.328 kg)  09/01/12 138 lb 12.8 oz (62.959 kg)     BP Readings from Last 3 Encounters:  10/01/12 148/78  09/10/12 121/69  09/01/12 138/74     BP 148/78  Pulse 76  Temp(Src) 98.6 F (37 C) (Oral)  Resp 16  Wt 134 lb (60.782 kg)  BMI 22.3 kg/m2   Past Medical History  Diagnosis Date  . CAD (coronary artery disease)     Catheterization, January, 2007 patent grafts / hospitalization October 2 010 no MI  . Syncope 03/2009    ?orthostasis  . Allergic rhinitis   . History of colon polyps   . Diverticulosis of colon   . GERD (gastroesophageal reflux disease)   . Hyperlipidemia   . Abdominal  pain, other specified site     motility disorder, chorinic functional, severe (Dr Juanda Chance). Possible OCD w/complusive use of enemas and laxatives; psychotic features 2011  . Positive H. pylori test 2911  . Psychosomatic disorder     w/GI fixation  . Anxiety   . Depression   . Stress incontinence, female   . Vitamin D deficiency   . Vitamin B12 deficiency   . Renal insufficiency   . UTI (urinary tract infection)   . Hypokalemia     mild  . Insomnia   . Carotid artery disease     Doppler November, 1610, 0-39% LICA, 60-79% R. ICA.Marland Kitchen increased velocity... possibly from serpentine vessel  . Ejection fraction     50-55%, no wall motion abnormalities, echo, November, 2010  . Edema   . Hemothorax on right   . Breast cancer 2011    Dr Welton Flakes- right breast   Past Surgical History  Procedure Laterality Date  . Coronary artery bypass graft    . Myomectomy      Uterine and ovarian  . Breast  fibroadenoma surgery    . Hemicolectomy  2005    Left  . Breast lumpectomy  2011  . Right vats (video-assisted thoracoscopic surgery) with      reports that she has never smoked. She does not have any smokeless tobacco history on file. She reports that she does not drink alcohol or use illicit drugs. family history includes Arthritis in her mother; Colon cancer in her mother; Coronary artery disease in an unspecified family member; and Heart disease in her father and mother. Allergies  Allergen Reactions  . Baclofen     REACTION: confusion  . Belladonna   . Ciprofloxacin   . Doxycycline   . Erythromycin   . Gabapentin   . Iohexol      Code: RASH   . Metoclopramide     Clonic spasms  . Metronidazole     REACTION: rash  . Nalbuphine   . Nitrofurantoin     REACTION: nausea  . Penicillins   . Procaine Hcl   . Sucralfate     REACTION: nausea  . Sulfonamide Derivatives   . Methenamine Rash   Current Outpatient Prescriptions on File Prior to Visit  Medication Sig Dispense Refill  . anastrozole (  ARIMIDEX) 1 MG tablet Take 1 tablet (1 mg total) by mouth daily.  90 tablet  3  . clotrimazole-betamethasone (LOTRISONE) cream Apply topically 2 (two) times daily.  90 g  3  . conjugated estrogens (PREMARIN) vaginal cream Place 0.5 Applicatorfuls vaginally 2 (two) times a week.  42.5 g  5  . [employer] ALLERGY 25 MG capsule TAKE ONE CAPSULE BY MOUTH EVERY 6 HOURS AS NEEDED ITCHING  60 capsule  1  . diphenhydrAMINE (BENADRYL) 25 mg capsule Take 1 capsule (25 mg total) by mouth every 6 (six) hours as needed for itching.  60 capsule  5  . divalproex (DEPAKOTE) 250 MG DR tablet Take 1 tablet (250 mg total) by mouth 2 (two) times daily.  60 tablet  11  . DULoxetine (CYMBALTA) 30 MG capsule Take 1 capsule (30 mg total) by mouth daily.  90 capsule  3  . indapamide (LOZOL) 2.5 MG tablet Take 1 tablet (2.5 mg total) by mouth every morning.  90 tablet  3  . KLOR-CON M10 10 MEQ tablet TAKE 1 TABLET BY MOUTH  2 TIMES A DAY  60 tablet  5  . Lactulose 20 GM/30ML SOLN Take 30-60 mLs by mouth as needed.        . loratadine (CLARITIN) 10 MG tablet Take 1 tablet (10 mg total) by mouth daily.  100 tablet  3  . LORazepam (ATIVAN) 1 MG tablet Take 1 tablet (1 mg total) by mouth every 8 (eight) hours as needed for anxiety.  270 tablet  1  . metoCLOPramide (REGLAN) 5 MG tablet TAKE 1 TABLET BY MOUTH 3 TIMES A DAY BEFORE MEALS  90 tablet  1  . metoprolol succinate (TOPROL-XL) 25 MG 24 hr tablet Take 1 tablet (25 mg total) by mouth daily.  90 tablet  3  . mometasone (ELOCON) 0.1 % ointment Apply topically 2 (two) times daily. Apply to vaginal area  45 g  1  . omeprazole (PRILOSEC) 20 MG capsule Take 1 capsule (20 mg total) by mouth 2 (two) times daily.  180 capsule  3  . Pancrelipase, Lip-Prot-Amyl, (CREON) 24000 UNITS CPEP Take 1 capsule (24,000 Units total) by mouth 3 (three) times daily with meals.  90 capsule  5  . promethazine (PHENERGAN) 25 MG tablet Take 1 tablet (25 mg total) by mouth every 8 (eight) hours as needed for nausea.  60 tablet  5  . Tamsulosin HCl (FLOMAX) 0.4 MG CAPS Take 1 capsule (0.4 mg total) by mouth daily.  90 capsule  3  . trimethoprim (TRIMPEX) 100 MG tablet Take 1 tablet (100 mg total) by mouth daily.  90 tablet  3  . Vitamin D, Ergocalciferol, (DRISDOL) 50000 UNITS CAPS Take 1 capsule (50,000 Units total) by mouth every 30 (thirty) days.  6 capsule  1  . zolpidem (AMBIEN) 10 MG tablet TAKE 1 TABLET AT BEDTIME AS NEEDED  90 tablet  1   No current facility-administered medications on file prior to visit.      Review of Systems  Constitutional: Positive for fatigue. Negative for activity change, appetite change and unexpected weight change.  HENT: Negative for congestion, mouth sores and sinus pressure.   Eyes: Negative for visual disturbance.  Respiratory: Negative for cough and chest tightness.   Gastrointestinal: Positive for constipation. Negative for abdominal pain, diarrhea  and blood in stool.  Genitourinary: Negative for difficulty urinating and vaginal pain.  Musculoskeletal: Negative for back pain and gait problem.  Skin: Negative for pallor and rash.  Neurological: Negative for dizziness, tremors, weakness, numbness and headaches.  Psychiatric/Behavioral: Positive for behavioral problems, sleep disturbance, dysphoric mood and decreased concentration. Negative for suicidal ideas, hallucinations, confusion and agitation. The patient is nervous/anxious.        Objective:   Physical Exam  Constitutional: She appears well-developed. No distress.  Chron ill appearing  HENT:  Head: Normocephalic.  Right Ear: External ear normal.  Left Ear: External ear normal.  Nose: Nose normal.  Mouth/Throat: Oropharynx is clear and moist.  Eyes: Conjunctivae are normal. Pupils are equal, round, and reactive to light. Right eye exhibits no discharge. Left eye exhibits no discharge.  Neck: Normal range of motion. Neck supple. No JVD present. No tracheal deviation present. No thyromegaly present.  Cardiovascular: Normal rate, regular rhythm and normal heart sounds.   Pulmonary/Chest: No stridor. No respiratory distress. She has no wheezes.  Abdominal: Soft. Bowel sounds are normal. She exhibits no distension and no mass. There is tenderness (mild). There is no rebound and no guarding.  Musculoskeletal: She exhibits no edema and no tenderness.  Lymphadenopathy:    She has no cervical adenopathy.  Neurological: She displays normal reflexes. No cranial nerve deficit. She exhibits normal muscle tone. Coordination normal.  Skin: No rash noted. No erythema.  Psychiatric: Her behavior is normal. Thought content normal.  depressed     Lab Results  Component Value Date   WBC 6.3 06/02/2012   HGB 11.0* 06/02/2012   HCT 33.1* 06/02/2012   PLT 202.0 06/02/2012   GLUCOSE 66* 06/02/2012   CHOL 302* 10/07/2009   TRIG 274.0* 10/07/2009   HDL 51.50 10/07/2009   LDLDIRECT 195.8 10/07/2009    LDLCALC  Value: 280        Total Cholesterol/HDL:CHD Risk Coronary Heart Disease Risk Table                     Men   Women  1/2 Average Risk   3.4   3.3  Average Risk       5.0   4.4  2 X Average Risk   9.6   7.1  3 X Average Risk  23.4   11.0        Use the calculated Patient Ratio above and the CHD Risk Table to determine the patient's CHD Risk.        ATP III CLASSIFICATION (LDL):  <100     mg/dL   Optimal  161-096  mg/dL   Near or Above                    Optimal  130-159  mg/dL   Borderline  045-409  mg/dL   High  >811     mg/dL   Very High* 91/08/7827   ALT 17 06/02/2012   AST 24 06/02/2012   NA 135 06/02/2012   K 4.4 06/02/2012   CL 101 06/02/2012   CREATININE 1.3* 06/02/2012   BUN 26* 06/02/2012   CO2 30 06/02/2012   TSH 3.09 02/29/2012   INR 1.02 12/25/2010      Procedure Note :     Procedure : Cryosurgery   Indication:  Wart(s)  Actinic keratosis(es)   Risks including unsuccessful procedure , bleeding, infection, bruising, scar, a need for a repeat  procedure and others were explained to the patient in detail as well as the benefits. Informed consent was obtained verbally.    1 lesion(s)  on L cheek   was/were treated with liquid nitrogen on a  Q-tip in a usual fasion . Band-Aid was applied and antibiotic ointment was given for a later use.   Tolerated well. Complications none.   Postprocedure instructions :     Keep the wounds clean. You can wash them with liquid soap and water. Pat dry with gauze or a Kleenex tissue  Before applying antibiotic ointment and a Band-Aid.   You need to report immediately  if  any signs of infection develop.       Assessment & Plan:

## 2012-10-01 NOTE — Assessment & Plan Note (Signed)
Continue with current prescription therapy as reflected on the Med list.  

## 2012-10-01 NOTE — Assessment & Plan Note (Signed)
NTG was d/c'd

## 2012-10-01 NOTE — Assessment & Plan Note (Signed)
5/14 L face s/p fall Resolving

## 2012-10-01 NOTE — Assessment & Plan Note (Signed)
Cryo - L cheek

## 2012-11-09 ENCOUNTER — Other Ambulatory Visit: Payer: Self-pay | Admitting: Internal Medicine

## 2012-12-02 ENCOUNTER — Encounter: Payer: Self-pay | Admitting: Internal Medicine

## 2012-12-02 ENCOUNTER — Ambulatory Visit (INDEPENDENT_AMBULATORY_CARE_PROVIDER_SITE_OTHER): Payer: Medicare Other | Admitting: Internal Medicine

## 2012-12-02 ENCOUNTER — Other Ambulatory Visit: Payer: Self-pay | Admitting: Cardiology

## 2012-12-02 ENCOUNTER — Other Ambulatory Visit (INDEPENDENT_AMBULATORY_CARE_PROVIDER_SITE_OTHER): Payer: Medicare Other

## 2012-12-02 VITALS — BP 122/70 | HR 76 | Temp 98.0°F | Resp 16 | Wt 141.0 lb

## 2012-12-02 DIAGNOSIS — F411 Generalized anxiety disorder: Secondary | ICD-10-CM

## 2012-12-02 DIAGNOSIS — F329 Major depressive disorder, single episode, unspecified: Secondary | ICD-10-CM

## 2012-12-02 DIAGNOSIS — G4700 Insomnia, unspecified: Secondary | ICD-10-CM

## 2012-12-02 DIAGNOSIS — E538 Deficiency of other specified B group vitamins: Secondary | ICD-10-CM

## 2012-12-02 DIAGNOSIS — E785 Hyperlipidemia, unspecified: Secondary | ICD-10-CM

## 2012-12-02 DIAGNOSIS — I2510 Atherosclerotic heart disease of native coronary artery without angina pectoris: Secondary | ICD-10-CM

## 2012-12-02 DIAGNOSIS — F429 Obsessive-compulsive disorder, unspecified: Secondary | ICD-10-CM

## 2012-12-02 DIAGNOSIS — I959 Hypotension, unspecified: Secondary | ICD-10-CM

## 2012-12-02 DIAGNOSIS — F3289 Other specified depressive episodes: Secondary | ICD-10-CM

## 2012-12-02 DIAGNOSIS — N259 Disorder resulting from impaired renal tubular function, unspecified: Secondary | ICD-10-CM

## 2012-12-02 LAB — CBC WITH DIFFERENTIAL/PLATELET
Eosinophils Absolute: 0.8 10*3/uL — ABNORMAL HIGH (ref 0.0–0.7)
Eosinophils Relative: 9 % — ABNORMAL HIGH (ref 0.0–5.0)
HCT: 35.4 % — ABNORMAL LOW (ref 36.0–46.0)
Lymphs Abs: 1.4 10*3/uL (ref 0.7–4.0)
MCHC: 33.4 g/dL (ref 30.0–36.0)
MCV: 95.1 fl (ref 78.0–100.0)
Monocytes Absolute: 0.9 10*3/uL (ref 0.1–1.0)
Neutrophils Relative %: 63 % (ref 43.0–77.0)
Platelets: 211 10*3/uL (ref 150.0–400.0)
RDW: 13.7 % (ref 11.5–14.6)

## 2012-12-02 LAB — BASIC METABOLIC PANEL
BUN: 32 mg/dL — ABNORMAL HIGH (ref 6–23)
CO2: 30 mEq/L (ref 19–32)
Chloride: 101 mEq/L (ref 96–112)
Creatinine, Ser: 1.6 mg/dL — ABNORMAL HIGH (ref 0.4–1.2)
Glucose, Bld: 101 mg/dL — ABNORMAL HIGH (ref 70–99)
Potassium: 5.7 mEq/L — ABNORMAL HIGH (ref 3.5–5.1)

## 2012-12-02 LAB — HEPATIC FUNCTION PANEL
ALT: 20 U/L (ref 0–35)
Bilirubin, Direct: 0.1 mg/dL (ref 0.0–0.3)
Total Bilirubin: 0.6 mg/dL (ref 0.3–1.2)

## 2012-12-02 LAB — TSH: TSH: 2.27 u[IU]/mL (ref 0.35–5.50)

## 2012-12-02 MED ORDER — TRIMETHOPRIM 100 MG PO TABS: 100.0000 mg | ORAL_TABLET | Freq: Every day | ORAL | Status: DC

## 2012-12-02 MED ORDER — METHADONE HCL 10 MG PO TABS: 10.0000 mg | ORAL_TABLET | Freq: Every day | ORAL | Status: DC | PRN

## 2012-12-02 MED ORDER — ZOLPIDEM TARTRATE 10 MG PO TABS: 10.0000 mg | ORAL_TABLET | Freq: Every evening | ORAL | Status: DC | PRN

## 2012-12-02 MED ORDER — DULOXETINE HCL 30 MG PO CPEP: 30.0000 mg | ORAL_CAPSULE | Freq: Every day | ORAL | Status: DC

## 2012-12-02 MED ORDER — PROMETHAZINE HCL 25 MG PO TABS: 25.0000 mg | ORAL_TABLET | Freq: Three times a day (TID) | ORAL | Status: DC | PRN

## 2012-12-02 MED ORDER — TAMSULOSIN HCL 0.4 MG PO CAPS: 0.4000 mg | ORAL_CAPSULE | Freq: Every day | ORAL | Status: AC

## 2012-12-02 NOTE — Assessment & Plan Note (Signed)
Continue with current prescription therapy as reflected on the Med list.  

## 2012-12-02 NOTE — Progress Notes (Signed)
Subjective:    HPI  The patient is here to follow up on chronic paranoid depression, anxiety,  and chronic moderate rectal pain symptoms controlled with medicines some. C/o chronic severe abd pain. C/o spasms in legs and arms at night; not sleeping  Wt Readings from Last 3 Encounters:  12/02/12 141 lb (63.957 kg)  10/01/12 134 lb (60.782 kg)  09/10/12 133 lb (60.328 kg)     BP Readings from Last 3 Encounters:  12/02/12 122/70  10/01/12 148/78  09/10/12 121/69     BP 122/70  Pulse 76  Temp(Src) 98 F (36.7 C) (Oral)  Resp 16  Wt 141 lb (63.957 kg)  BMI 23.46 kg/m2   Past Medical History  Diagnosis Date  . CAD (coronary artery disease)     Catheterization, January, 2007 patent grafts / hospitalization October 2 010 no MI  . Syncope 03/2009    ?orthostasis  . Allergic rhinitis   . History of colon polyps   . Diverticulosis of colon   . GERD (gastroesophageal reflux disease)   . Hyperlipidemia   . Abdominal  pain, other specified site     motility disorder, chorinic functional, severe (Dr Juanda Chance). Possible OCD w/complusive use of enemas and laxatives; psychotic features 2011  . Positive H. pylori test 2911  . Psychosomatic disorder     w/GI fixation  . Anxiety   . Depression   . Stress incontinence, female   . Vitamin D deficiency   . Vitamin B12 deficiency   . Renal insufficiency   . UTI (urinary tract infection)   . Hypokalemia     mild  . Insomnia   . Carotid artery disease     Doppler November, 1610, 0-39% LICA, 60-79% R. ICA.Marland Kitchen increased velocity... possibly from serpentine vessel  . Ejection fraction     50-55%, no wall motion abnormalities, echo, November, 2010  . Edema   . Hemothorax on right   . Breast cancer 2011    Dr Welton Flakes- right breast   Past Surgical History  Procedure Laterality Date  . Coronary artery bypass graft    . Myomectomy      Uterine and ovarian  . Breast fibroadenoma surgery    . Hemicolectomy  2005    Left  . Breast  lumpectomy  2011  . Right vats (video-assisted thoracoscopic surgery) with      reports that she has never smoked. She does not have any smokeless tobacco history on file. She reports that she does not drink alcohol or use illicit drugs. family history includes Arthritis in her mother; Colon cancer in her mother; Coronary artery disease in an unspecified family member; and Heart disease in her father and mother. Allergies  Allergen Reactions  . Baclofen     REACTION: confusion  . Belladonna   . Ciprofloxacin   . Doxycycline   . Erythromycin   . Gabapentin   . Iohexol      Code: RASH   . Metoclopramide     Clonic spasms  . Metronidazole     REACTION: rash  . Nalbuphine   . Nitrofurantoin     REACTION: nausea  . Penicillins   . Procaine Hcl   . Sucralfate     REACTION: nausea  . Sulfonamide Derivatives   . Methenamine Rash   Current Outpatient Prescriptions on File Prior to Visit  Medication Sig Dispense Refill  . anastrozole (ARIMIDEX) 1 MG tablet Take 1 tablet (1 mg total) by mouth daily.  90 tablet  3  . clotrimazole-betamethasone (LOTRISONE) cream Apply topically 2 (two) times daily.  90 g  3  . conjugated estrogens (PREMARIN) vaginal cream Place 0.5 Applicatorfuls vaginally 2 (two) times a week.  42.5 g  5  . [employer] ALLERGY 25 MG capsule TAKE ONE CAPSULE BY MOUTH EVERY 6 HOURS AS NEEDED ITCHING  60 capsule  1  . diphenhydrAMINE (BENADRYL) 25 mg capsule Take 1 capsule (25 mg total) by mouth every 6 (six) hours as needed for itching.  60 capsule  5  . divalproex (DEPAKOTE) 250 MG DR tablet Take 1 tablet (250 mg total) by mouth 2 (two) times daily.  60 tablet  11  . DULoxetine (CYMBALTA) 30 MG capsule Take 1 capsule (30 mg total) by mouth daily.  90 capsule  3  . indapamide (LOZOL) 2.5 MG tablet Take 1 tablet (2.5 mg total) by mouth every morning.  90 tablet  3  . KLOR-CON M10 10 MEQ tablet TAKE 1 TABLET BY MOUTH 2 TIMES A DAY  60 tablet  5  . Lactulose 20 GM/30ML SOLN Take  30-60 mLs by mouth as needed.        . loratadine (CLARITIN) 10 MG tablet Take 1 tablet (10 mg total) by mouth daily.  100 tablet  3  . LORazepam (ATIVAN) 1 MG tablet Take 1 tablet (1 mg total) by mouth every 8 (eight) hours as needed for anxiety.  270 tablet  1  . metoCLOPramide (REGLAN) 5 MG tablet TAKE 1 TABLET BY MOUTH 3 TIMES A DAY BEFORE MEALS  90 tablet  1  . metoprolol succinate (TOPROL-XL) 25 MG 24 hr tablet Take 1 tablet (25 mg total) by mouth daily.  90 tablet  3  . mometasone (ELOCON) 0.1 % ointment Apply topically 2 (two) times daily. Apply to vaginal area  45 g  1  . omeprazole (PRILOSEC) 20 MG capsule Take 1 capsule (20 mg total) by mouth 2 (two) times daily.  180 capsule  3  . Pancrelipase, Lip-Prot-Amyl, (CREON) 24000 UNITS CPEP Take 1 capsule (24,000 Units total) by mouth 3 (three) times daily with meals.  90 capsule  5  . promethazine (PHENERGAN) 25 MG tablet Take 1 tablet (25 mg total) by mouth every 8 (eight) hours as needed for nausea.  60 tablet  5  . Tamsulosin HCl (FLOMAX) 0.4 MG CAPS Take 1 capsule (0.4 mg total) by mouth daily.  90 capsule  3  . trimethoprim (TRIMPEX) 100 MG tablet Take 1 tablet (100 mg total) by mouth daily.  90 tablet  3  . Vitamin D, Ergocalciferol, (DRISDOL) 50000 UNITS CAPS Take 1 capsule (50,000 Units total) by mouth every 30 (thirty) days.  6 capsule  1  . zolpidem (AMBIEN) 10 MG tablet TAKE 1 TABLET AT BEDTIME AS NEEDED  90 tablet  1   No current facility-administered medications on file prior to visit.      Review of Systems  Constitutional: Positive for fatigue. Negative for activity change, appetite change and unexpected weight change.  HENT: Negative for congestion, mouth sores and sinus pressure.   Eyes: Negative for visual disturbance.  Respiratory: Negative for cough and chest tightness.   Gastrointestinal: Positive for constipation. Negative for abdominal pain, diarrhea and blood in stool.  Genitourinary: Negative for difficulty  urinating and vaginal pain.  Musculoskeletal: Negative for back pain and gait problem.  Skin: Negative for pallor and rash.  Neurological: Negative for dizziness, tremors, weakness, numbness and headaches.  Psychiatric/Behavioral: Positive for behavioral problems, sleep  disturbance, dysphoric mood and decreased concentration. Negative for suicidal ideas, hallucinations, confusion and agitation. The patient is nervous/anxious.        Objective:   Physical Exam  Constitutional: She appears well-developed. No distress.  Chron ill appearing  HENT:  Head: Normocephalic.  Right Ear: External ear normal.  Left Ear: External ear normal.  Nose: Nose normal.  Mouth/Throat: Oropharynx is clear and moist.  Eyes: Conjunctivae are normal. Pupils are equal, round, and reactive to light. Right eye exhibits no discharge. Left eye exhibits no discharge.  Neck: Normal range of motion. Neck supple. No JVD present. No tracheal deviation present. No thyromegaly present.  Cardiovascular: Normal rate, regular rhythm and normal heart sounds.   Pulmonary/Chest: No stridor. No respiratory distress. She has no wheezes.  Abdominal: Soft. Bowel sounds are normal. She exhibits no distension and no mass. There is tenderness (mild). There is no rebound and no guarding.  Musculoskeletal: She exhibits no edema and no tenderness.  Lymphadenopathy:    She has no cervical adenopathy.  Neurological: She displays normal reflexes. No cranial nerve deficit. She exhibits normal muscle tone. Coordination normal.  Skin: No rash noted. No erythema.  Psychiatric: Her behavior is normal. Thought content normal.  depressed     Lab Results  Component Value Date   WBC 6.3 06/02/2012   HGB 11.0* 06/02/2012   HCT 33.1* 06/02/2012   PLT 202.0 06/02/2012   GLUCOSE 66* 06/02/2012   CHOL 302* 10/07/2009   TRIG 274.0* 10/07/2009   HDL 51.50 10/07/2009   LDLDIRECT 195.8 10/07/2009   LDLCALC  Value: 280        Total Cholesterol/HDL:CHD Risk  Coronary Heart Disease Risk Table                     Men   Women  1/2 Average Risk   3.4   3.3  Average Risk       5.0   4.4  2 X Average Risk   9.6   7.1  3 X Average Risk  23.4   11.0        Use the calculated Patient Ratio above and the CHD Risk Table to determine the patient's CHD Risk.        ATP III CLASSIFICATION (LDL):  <100     mg/dL   Optimal  161-096  mg/dL   Near or Above                    Optimal  130-159  mg/dL   Borderline  045-409  mg/dL   High  >811     mg/dL   Very High* 91/08/7827   ALT 17 06/02/2012   AST 24 06/02/2012   NA 135 06/02/2012   K 4.4 06/02/2012   CL 101 06/02/2012   CREATININE 1.3* 06/02/2012   BUN 26* 06/02/2012   CO2 30 06/02/2012   TSH 3.09 02/29/2012   INR 1.02 12/25/2010            Assessment & Plan:

## 2012-12-02 NOTE — Assessment & Plan Note (Signed)
On labs

## 2012-12-29 ENCOUNTER — Other Ambulatory Visit: Payer: Self-pay | Admitting: Internal Medicine

## 2012-12-30 DIAGNOSIS — Z0279 Encounter for issue of other medical certificate: Secondary | ICD-10-CM

## 2013-01-06 ENCOUNTER — Other Ambulatory Visit: Payer: Medicare Other

## 2013-01-06 ENCOUNTER — Encounter (INDEPENDENT_AMBULATORY_CARE_PROVIDER_SITE_OTHER): Payer: Medicare Other

## 2013-01-06 DIAGNOSIS — I6529 Occlusion and stenosis of unspecified carotid artery: Secondary | ICD-10-CM

## 2013-01-12 ENCOUNTER — Emergency Department (HOSPITAL_COMMUNITY)
Admission: EM | Admit: 2013-01-12 | Discharge: 2013-01-12 | Disposition: A | Discharge status: Home / Self Care | Payer: Medicare Other | Attending: Emergency Medicine | Admitting: Emergency Medicine

## 2013-01-12 ENCOUNTER — Encounter (HOSPITAL_COMMUNITY): Payer: Self-pay | Admitting: Emergency Medicine

## 2013-01-12 DIAGNOSIS — I779 Disorder of arteries and arterioles, unspecified: Secondary | ICD-10-CM | POA: Insufficient documentation

## 2013-01-12 DIAGNOSIS — J309 Allergic rhinitis, unspecified: Secondary | ICD-10-CM | POA: Insufficient documentation

## 2013-01-12 DIAGNOSIS — Z853 Personal history of malignant neoplasm of breast: Secondary | ICD-10-CM | POA: Insufficient documentation

## 2013-01-12 DIAGNOSIS — F459 Somatoform disorder, unspecified: Secondary | ICD-10-CM | POA: Insufficient documentation

## 2013-01-12 DIAGNOSIS — Z8719 Personal history of other diseases of the digestive system: Secondary | ICD-10-CM | POA: Insufficient documentation

## 2013-01-12 DIAGNOSIS — N289 Disorder of kidney and ureter, unspecified: Secondary | ICD-10-CM | POA: Insufficient documentation

## 2013-01-12 DIAGNOSIS — F411 Generalized anxiety disorder: Secondary | ICD-10-CM | POA: Insufficient documentation

## 2013-01-12 DIAGNOSIS — F329 Major depressive disorder, single episode, unspecified: Secondary | ICD-10-CM | POA: Insufficient documentation

## 2013-01-12 DIAGNOSIS — Z880 Allergy status to penicillin: Secondary | ICD-10-CM | POA: Insufficient documentation

## 2013-01-12 DIAGNOSIS — G4700 Insomnia, unspecified: Secondary | ICD-10-CM | POA: Insufficient documentation

## 2013-01-12 DIAGNOSIS — Z951 Presence of aortocoronary bypass graft: Secondary | ICD-10-CM | POA: Insufficient documentation

## 2013-01-12 DIAGNOSIS — E559 Vitamin D deficiency, unspecified: Secondary | ICD-10-CM | POA: Insufficient documentation

## 2013-01-12 DIAGNOSIS — E538 Deficiency of other specified B group vitamins: Secondary | ICD-10-CM | POA: Insufficient documentation

## 2013-01-12 DIAGNOSIS — Z860100 Personal history of colon polyps, unspecified: Secondary | ICD-10-CM | POA: Insufficient documentation

## 2013-01-12 DIAGNOSIS — Z881 Allergy status to other antibiotic agents status: Secondary | ICD-10-CM | POA: Insufficient documentation

## 2013-01-12 DIAGNOSIS — N390 Urinary tract infection, site not specified: Secondary | ICD-10-CM | POA: Insufficient documentation

## 2013-01-12 DIAGNOSIS — Z888 Allergy status to other drugs, medicaments and biological substances status: Secondary | ICD-10-CM | POA: Insufficient documentation

## 2013-01-12 DIAGNOSIS — E785 Hyperlipidemia, unspecified: Secondary | ICD-10-CM | POA: Insufficient documentation

## 2013-01-12 DIAGNOSIS — F3289 Other specified depressive episodes: Secondary | ICD-10-CM | POA: Insufficient documentation

## 2013-01-12 DIAGNOSIS — Z79899 Other long term (current) drug therapy: Secondary | ICD-10-CM | POA: Insufficient documentation

## 2013-01-12 DIAGNOSIS — K219 Gastro-esophageal reflux disease without esophagitis: Secondary | ICD-10-CM | POA: Insufficient documentation

## 2013-01-12 DIAGNOSIS — R0989 Other specified symptoms and signs involving the circulatory and respiratory systems: Secondary | ICD-10-CM | POA: Insufficient documentation

## 2013-01-12 DIAGNOSIS — I2510 Atherosclerotic heart disease of native coronary artery without angina pectoris: Secondary | ICD-10-CM | POA: Insufficient documentation

## 2013-01-12 DIAGNOSIS — Z882 Allergy status to sulfonamides status: Secondary | ICD-10-CM | POA: Insufficient documentation

## 2013-01-12 LAB — URINE MICROSCOPIC-ADD ON

## 2013-01-12 LAB — COMPREHENSIVE METABOLIC PANEL
ALT: 15 U/L (ref 0–35)
Calcium: 9 mg/dL (ref 8.4–10.5)
Creatinine, Ser: 1.39 mg/dL — ABNORMAL HIGH (ref 0.50–1.10)
GFR calc Af Amer: 40 mL/min — ABNORMAL LOW (ref >90)
Glucose, Bld: 112 mg/dL — ABNORMAL HIGH (ref 70–99)
Sodium: 137 mEq/L (ref 135–145)
Total Protein: 5.9 g/dL — ABNORMAL LOW (ref 6.0–8.3)

## 2013-01-12 LAB — CBC WITH DIFFERENTIAL/PLATELET
Basophils Absolute: 0 10*3/uL (ref 0.0–0.1)
Eosinophils Absolute: 0.8 10*3/uL — ABNORMAL HIGH (ref 0.0–0.7)
Eosinophils Relative: 15 % — ABNORMAL HIGH (ref 0–5)
MCH: 31.4 pg (ref 26.0–34.0)
MCV: 94.7 fL (ref 78.0–100.0)
Platelets: 163 10*3/uL (ref 150–400)
RDW: 13.2 % (ref 11.5–15.5)

## 2013-01-12 LAB — URINALYSIS, ROUTINE W REFLEX MICROSCOPIC
Hgb urine dipstick: NEGATIVE
Ketones, ur: NEGATIVE mg/dL
Protein, ur: NEGATIVE mg/dL
Urobilinogen, UA: 0.2 mg/dL (ref 0.0–1.0)

## 2013-01-12 MED ORDER — CEFUROXIME AXETIL 250 MG PO TABS: 250.0000 mg | ORAL_TABLET | Freq: Two times a day (BID) | ORAL | Status: DC

## 2013-01-12 NOTE — ED Notes (Signed)
Pt assisted up to br to void voided in hat

## 2013-01-12 NOTE — ED Notes (Signed)
Pt s family states she has had abd pain and cannot get comfortable all night hurts to void nausea ,not vomiting has had cramps

## 2013-01-12 NOTE — ED Provider Notes (Signed)
CSN: [id number]     Arrival date & time 01/12/13  0827 History   First MD Initiated Contact with Patient 01/12/13 [phone]     Chief Complaint  Patient presents with  . Abdominal Pain   (Consider location/radiation/quality/duration/timing/severity/associated sxs/prior Treatment) Patient is a 79 y.o. female presenting with abdominal pain. The history is provided by the patient and a relative. The history is limited by a language barrier. A language interpreter was used.  Abdominal Pain Pain location:  Suprapubic Pain quality: aching and cramping   Pain quality: not bloating, not burning, no fullness, not gnawing, not heavy, not sharp and not shooting   Pain radiates to:  Does not radiate Pain severity:  Mild Onset quality:  Gradual Duration:  2 days Timing:  Intermittent Chronicity:  Recurrent Context: previous surgery   Context: not awakening from sleep, not diet changes, not medication withdrawal, not retching, not sick contacts, not suspicious food intake and not trauma   Relieved by:  Nothing Worsened by:  Nothing tried Associated symptoms: dysuria   Associated symptoms: no chills, no constipation, no cough, no diarrhea, no fatigue, no fever, no flatus, no hematemesis, no hematochezia, no hematuria, no melena, no nausea, no shortness of breath, no sore throat, no vaginal bleeding, no vaginal discharge and no vomiting   Associated symptoms comment:  Report of increased urinary frequency   Past Medical History  Diagnosis Date  . CAD (coronary artery disease)     Catheterization, January, 2007 patent grafts / hospitalization October 2 010 no MI  . Syncope 03/2009    ?orthostasis  . Allergic rhinitis   . History of colon polyps   . Diverticulosis of colon   . GERD (gastroesophageal reflux disease)   . Hyperlipidemia   . Abdominal  pain, other specified site     motility disorder, chorinic functional, severe (Dr Juanda Chance). Possible OCD w/complusive use of enemas and laxatives;  psychotic features 2011  . Positive H. pylori test 2911  . Psychosomatic disorder     w/GI fixation  . Anxiety   . Depression   . Stress incontinence, female   . Vitamin D deficiency   . Vitamin B12 deficiency   . Renal insufficiency   . UTI (urinary tract infection)   . Hypokalemia     mild  . Insomnia   . Carotid artery disease     Doppler November, 1191, 0-39% LICA, 60-79% R. ICA.Marland Kitchen increased velocity... possibly from serpentine vessel  . Ejection fraction     50-55%, no wall motion abnormalities, echo, November, 2010  . Edema   . Hemothorax on right   . Breast cancer 2011    Dr Welton Flakes- right breast   Past Surgical History  Procedure Laterality Date  . Coronary artery bypass graft    . Myomectomy      Uterine and ovarian  . Breast fibroadenoma surgery    . Hemicolectomy  2005    Left  . Breast lumpectomy  2011  . Right vats (video-assisted thoracoscopic surgery) with     Family History  Problem Relation Age of Onset  . Coronary artery disease      Female 1st degree relative <60  . Colon cancer Mother   . Arthritis Mother   . Heart disease Mother   . Heart disease Father    History  Substance Use Topics  . Smoking status: Never Smoker   . Smokeless tobacco: Not on file  . Alcohol Use: No   OB History   Grav Para  Term Preterm Abortions TAB SAB Ect Mult Living                 Review of Systems  Constitutional: Positive for activity change. Negative for fever, chills, diaphoresis, appetite change and fatigue.  HENT: Negative.  Negative for sore throat.   Eyes: Negative.  Negative for itching.  Respiratory: Negative.  Negative for cough and shortness of breath.   Cardiovascular: Negative.   Gastrointestinal: Positive for abdominal pain. Negative for nausea, vomiting, diarrhea, constipation, blood in stool, melena, hematochezia, abdominal distention, anal bleeding, flatus and hematemesis.  Endocrine: Negative.   Genitourinary: Positive for dysuria and  frequency. Negative for hematuria, flank pain, vaginal bleeding, vaginal discharge, enuresis and difficulty urinating.  Allergic/Immunologic: Negative.   Neurological: Negative.   Hematological: Negative.     Allergies  Baclofen; Belladonna; Ciprofloxacin; Doxycycline; Erythromycin; Gabapentin; Iohexol; Metoclopramide; Metronidazole; Nalbuphine; Nitrofurantoin; Penicillins; Procaine hcl; Sucralfate; Sulfonamide derivatives; and Methenamine  Home Medications   Current Outpatient Rx  Name  Route  Sig  Dispense  Refill  . anastrozole (ARIMIDEX) 1 MG tablet   Oral   Take 1 tablet (1 mg total) by mouth daily.   90 tablet   3   . conjugated estrogens (PREMARIN) vaginal cream   Vaginal   Place 0.5 Applicatorfuls vaginally 2 (two) times a week.   42.5 g   5   . [facility] ALLERGY 25 MG capsule      TAKE ONE CAPSULE BY MOUTH EVERY 6 HOURS AS NEEDED   60 capsule   5   . divalproex (DEPAKOTE) 250 MG DR tablet   Oral   Take 1 tablet (250 mg total) by mouth 2 (two) times daily.   60 tablet   11   . DULoxetine (CYMBALTA) 30 MG capsule   Oral   Take 1 capsule (30 mg total) by mouth daily.   90 capsule   3   . indapamide (LOZOL) 2.5 MG tablet   Oral   Take 2.5 mg by mouth daily as needed. Only takes if blood pressure is high         . Lactulose 20 GM/30ML SOLN   Oral   Take 30-60 mLs by mouth as needed.           . loratadine (CLARITIN) 10 MG tablet   Oral   Take 1 tablet (10 mg total) by mouth daily.   100 tablet   3   . LORazepam (ATIVAN) 1 MG tablet   Oral   Take 1 tablet (1 mg total) by mouth every 8 (eight) hours as needed for anxiety.   270 tablet   1   . methadone (DOLOPHINE) 10 MG tablet   Oral   Take 1 tablet (10 mg total) by mouth 5 (five) times daily as needed. Fill on or after 01/29/13. Once every 30 days   150 tablet   0   . metoCLOPramide (REGLAN) 5 MG tablet      TAKE 1 TABLET BY MOUTH 3 TIMES A DAY BEFORE MEALS   90 tablet   1   . metoprolol  succinate (TOPROL-XL) 25 MG 24 hr tablet   Oral   Take 1 tablet (25 mg total) by mouth daily.   90 tablet   3   . omeprazole (PRILOSEC) 20 MG capsule   Oral   Take 1 capsule (20 mg total) by mouth 2 (two) times daily.   180 capsule   3   . Pancrelipase, Lip-Prot-Amyl, (CREON) 24000 UNITS CPEP  Oral   Take 1 capsule (24,000 Units total) by mouth 3 (three) times daily with meals.   90 capsule   5   . potassium chloride (KLOR-CON M10) 10 MEQ tablet   Oral   Take 1 tablet (10 mEq total) by mouth 2 (two) times daily.   60 tablet   5   . promethazine (PHENERGAN) 25 MG tablet   Oral   Take 1 tablet (25 mg total) by mouth every 8 (eight) hours as needed for nausea.   60 tablet   5   . tamsulosin (FLOMAX) 0.4 MG CAPS   Oral   Take 1 capsule (0.4 mg total) by mouth daily.   90 capsule   3   . trimethoprim (TRIMPEX) 100 MG tablet   Oral   Take 1 tablet (100 mg total) by mouth daily.   90 tablet   3   . Vitamin D, Ergocalciferol, (DRISDOL) 50000 UNITS CAPS   Oral   Take 1 capsule (50,000 Units total) by mouth every 30 (thirty) days.   6 capsule   1   . zolpidem (AMBIEN) 10 MG tablet   Oral   Take 1 tablet (10 mg total) by mouth at bedtime as needed for sleep.   90 tablet   1   . cefUROXime (CEFTIN) 250 MG tablet   Oral   Take 1 tablet (250 mg total) by mouth 2 (two) times daily.   14 tablet   0    BP 126/64  Pulse 69  Temp(Src) 98.1 F (36.7 C) (Oral)  Resp 19  SpO2 97% Physical Exam  Constitutional: She is oriented to person, place, and time. She appears well-developed.  HENT:  Head: Normocephalic and atraumatic.  Eyes: Conjunctivae and EOM are normal. Pupils are equal, round, and reactive to light.  Neck: Normal range of motion. Neck supple.  Cardiovascular: Normal rate and regular rhythm.   Pulmonary/Chest: Effort normal and breath sounds normal.  Abdominal: Soft. Bowel sounds are normal. She exhibits no distension and no mass. There is tenderness in  the suprapubic area. There is no rigidity, no rebound, no guarding, no tenderness at McBurney's point and negative Murphy's sign.    Musculoskeletal: Normal range of motion.  Neurological: She is alert and oriented to person, place, and time. She has normal reflexes.   Results for orders placed during the hospital encounter of 01/12/13  URINE CULTURE      Result Value Range   Specimen Description URINE, RANDOM     Special Requests NONE     Culture  Setup Time       Value: 01/12/2013 17:44     Performed at [facility] Count       Value: >=100,000 COLONIES/ML     Performed at [facility]   Culture       Value: GRAM NEGATIVE RODS     Performed at [facility]   Report Status PENDING    URINALYSIS, ROUTINE W REFLEX MICROSCOPIC      Result Value Range   Color, Urine YELLOW  YELLOW   APPearance CLOUDY (*) CLEAR   Specific Gravity, Urine 1.021  1.005 - 1.030   pH 5.0  5.0 - 8.0   Glucose, UA NEGATIVE  NEGATIVE mg/dL   Hgb urine dipstick NEGATIVE  NEGATIVE   Bilirubin Urine SMALL (*) NEGATIVE   Ketones, ur NEGATIVE  NEGATIVE mg/dL   Protein, ur NEGATIVE  NEGATIVE mg/dL   Urobilinogen, UA 0.2  0.0 - 1.0 mg/dL   Nitrite POSITIVE (*) NEGATIVE   Leukocytes, UA LARGE (*) NEGATIVE  CBC WITH DIFFERENTIAL      Result Value Range   WBC 5.7  4.0 - 10.5 K/uL   RBC 3.03 (*) 3.87 - 5.11 MIL/uL   Hemoglobin 9.5 (*) 12.0 - 15.0 g/dL   HCT 16.1 (*) 09.6 - 04.5 %   MCV 94.7  78.0 - 100.0 fL   MCH 31.4  26.0 - 34.0 pg   MCHC 33.1  30.0 - 36.0 g/dL   RDW 40.9  81.1 - 91.4 %   Platelets 163  150 - 400 K/uL   Neutrophils Relative % 55  43 - 77 %   Neutro Abs 3.2  1.7 - 7.7 K/uL   Lymphocytes Relative 20  12 - 46 %   Lymphs Abs 1.1  0.7 - 4.0 K/uL   Monocytes Relative 10  3 - 12 %   Monocytes Absolute 0.6  0.1 - 1.0 K/uL   Eosinophils Relative 15 (*) 0 - 5 %   Eosinophils Absolute 0.8 (*) 0.0 - 0.7 K/uL   Basophils Relative 0  0 - 1 %   Basophils Absolute  0.0  0.0 - 0.1 K/uL  COMPREHENSIVE METABOLIC PANEL      Result Value Range   Sodium 137  135 - 145 mEq/L   Potassium 3.9  3.5 - 5.1 mEq/L   Chloride 104  96 - 112 mEq/L   CO2 24  19 - 32 mEq/L   Glucose, Bld 112 (*) 70 - 99 mg/dL   BUN 32 (*) 6 - 23 mg/dL   Creatinine, Ser 7.82 (*) 0.50 - 1.10 mg/dL   Calcium 9.0  8.4 - 95.6 mg/dL   Total Protein 5.9 (*) 6.0 - 8.3 g/dL   Albumin 3.1 (*) 3.5 - 5.2 g/dL   AST 18  0 - 37 U/L   ALT 15  0 - 35 U/L   Alkaline Phosphatase 153 (*) 39 - 117 U/L   Total Bilirubin 0.2 (*) 0.3 - 1.2 mg/dL   GFR calc non Af Amer 34 (*) >90 mL/min   GFR calc Af Amer 40 (*) >90 mL/min  URINE MICROSCOPIC-ADD ON      Result Value Range   Squamous Epithelial / LPF FEW (*) RARE   WBC, UA 11-20  <3 WBC/hpf   Bacteria, UA MANY (*) RARE     ED Course  Procedures (including critical care time) Labs Review Labs Reviewed  URINALYSIS, ROUTINE W REFLEX MICROSCOPIC - Abnormal; Notable for the following:    APPearance CLOUDY (*)    Bilirubin Urine SMALL (*)    Nitrite POSITIVE (*)    Leukocytes, UA LARGE (*)    All other components within normal limits  CBC WITH DIFFERENTIAL - Abnormal; Notable for the following:    RBC 3.03 (*)    Hemoglobin 9.5 (*)    HCT 28.7 (*)    Eosinophils Relative 15 (*)    Eosinophils Absolute 0.8 (*)    All other components within normal limits  COMPREHENSIVE METABOLIC PANEL - Abnormal; Notable for the following:    Glucose, Bld 112 (*)    BUN 32 (*)    Creatinine, Ser 1.39 (*)    Total Protein 5.9 (*)    Albumin 3.1 (*)    Alkaline Phosphatase 153 (*)    Total Bilirubin 0.2 (*)    GFR calc non Af Amer 34 (*)    GFR calc Af Denyse Dago  40 (*)    All other components within normal limits  URINE MICROSCOPIC-ADD ON - Abnormal; Notable for the following:    Squamous Epithelial / LPF FEW (*)    Bacteria, UA MANY (*)    All other components within normal limits  URINE CULTURE   Imaging Review No results found.  MDM   1. UTI  (urinary tract infection)    79 yo wf with symptoms c/w UTI. ER w/u c/w UTI. No evidence of urosepsis.  VSSAF, benign abd exam.  Will treat for UTI.  Pt has multiple allergies to antibiotics.  I consulted pharmacy to determine the best antibiotic choice - review of past treatment revealed she had taken cephalosporins in the past.  Ceftin was chosen.  ER precautions were given.  The plan was given to the pt and her family.  Doubt pyelo, SBI, Surgical abdomen, sepsis, or other emergent condition.    The patient appears reasonably screened and/or stabilized for discharge and I doubt any other medical condition or other [facility] requiring further screening, evaluation, or treatment in the ED at this time prior to discharge.     Darlys Gales, MD 01/13/13 [phone]

## 2013-01-13 LAB — URINE CULTURE

## 2013-01-15 ENCOUNTER — Ambulatory Visit (INDEPENDENT_AMBULATORY_CARE_PROVIDER_SITE_OTHER): Payer: Medicare Other | Admitting: Internal Medicine

## 2013-01-15 ENCOUNTER — Encounter: Payer: Self-pay | Admitting: Internal Medicine

## 2013-01-15 VITALS — BP 160/80 | HR 72 | Temp 97.5°F | Resp 16 | Wt 145.0 lb

## 2013-01-15 DIAGNOSIS — R1011 Right upper quadrant pain: Secondary | ICD-10-CM

## 2013-01-15 DIAGNOSIS — R413 Other amnesia: Secondary | ICD-10-CM

## 2013-01-15 DIAGNOSIS — D509 Iron deficiency anemia, unspecified: Secondary | ICD-10-CM

## 2013-01-15 DIAGNOSIS — F429 Obsessive-compulsive disorder, unspecified: Secondary | ICD-10-CM

## 2013-01-15 DIAGNOSIS — G894 Chronic pain syndrome: Secondary | ICD-10-CM

## 2013-01-15 DIAGNOSIS — F329 Major depressive disorder, single episode, unspecified: Secondary | ICD-10-CM

## 2013-01-15 DIAGNOSIS — E538 Deficiency of other specified B group vitamins: Secondary | ICD-10-CM

## 2013-01-15 DIAGNOSIS — F3289 Other specified depressive episodes: Secondary | ICD-10-CM

## 2013-01-15 DIAGNOSIS — F411 Generalized anxiety disorder: Secondary | ICD-10-CM

## 2013-01-15 NOTE — Patient Instructions (Signed)
Gluten free trial (no wheat products) for 4-6 weeks. OK to use gluten-free bread and gluten-free pasta.  Milk free trial (no milk, ice cream, cheese and yogurt) for 4-6 weeks. OK to use almond, coconut, rice or soy milk. "Almond breeze" brand tastes good.  

## 2013-01-15 NOTE — Assessment & Plan Note (Signed)
Continue with current prescription therapy as reflected on the Med list.  

## 2013-01-15 NOTE — Assessment & Plan Note (Addendum)
9/14 psychotics episodes: C/o episodic night time restlessness, confusion, meds overuse lately. D/c Reglan It is a complex case with a lot of self-overmedicating for abdominal pain and "bowel fixation" issues Take 1 extra Lorazepam prn if agitated

## 2013-01-15 NOTE — Assessment & Plan Note (Signed)
Chronic  Anemia of chronic disease Probable "european NSAIDs" overuse  Discussed

## 2013-01-15 NOTE — Assessment & Plan Note (Addendum)
9/14 psychotics episodes: C/o episodic night time restlessness, confusion, meds overuse lately. It is a complex case with a lot of self-overmedicating for abdominal pain and "bowel fixation" issues Discussed

## 2013-01-15 NOTE — Assessment & Plan Note (Signed)
It is a complex case with a lot of self-overmedicating for abdominal pain and "bowel fixation" issues

## 2013-01-15 NOTE — Progress Notes (Signed)
Subjective:    HPI F/u ED visit on 01/12/13. C/o episodic night time restlessness, confusion, meds overuse lately.  The patient is here to follow up on chronic paranoid depression, anxiety,  and chronic moderate rectal pain symptoms controlled with medicines some. C/o chronic severe abd pain. F/u spasms in legs and arms at night; not sleeping  Wt Readings from Last 3 Encounters:  01/15/13 145 lb (65.772 kg)  12/02/12 141 lb (63.957 kg)  10/01/12 134 lb (60.782 kg)     BP Readings from Last 3 Encounters:  01/15/13 160/80  01/12/13 126/64  12/02/12 122/70     BP 160/80  Pulse 72  Temp(Src) 97.5 F (36.4 C) (Oral)  Resp 16  Wt 145 lb (65.772 kg)  BMI 24.13 kg/m2   Past Medical History  Diagnosis Date  . CAD (coronary artery disease)     Catheterization, January, 2007 patent grafts / hospitalization October 2 010 no MI  . Syncope 03/2009    ?orthostasis  . Allergic rhinitis   . History of colon polyps   . Diverticulosis of colon   . GERD (gastroesophageal reflux disease)   . Hyperlipidemia   . Abdominal  pain, other specified site     motility disorder, chorinic functional, severe (Dr Juanda Chance). Possible OCD w/complusive use of enemas and laxatives; psychotic features 2011  . Positive H. pylori test 2911  . Psychosomatic disorder     w/GI fixation  . Anxiety   . Depression   . Stress incontinence, female   . Vitamin D deficiency   . Vitamin B12 deficiency   . Renal insufficiency   . UTI (urinary tract infection)   . Hypokalemia     mild  . Insomnia   . Carotid artery disease     Doppler November, 8119, 0-39% LICA, 60-79% R. ICA.Marland Kitchen increased velocity... possibly from serpentine vessel  . Ejection fraction     50-55%, no wall motion abnormalities, echo, November, 2010  . Edema   . Hemothorax on right   . Breast cancer 2011    Dr Welton Flakes- right breast   Past Surgical History  Procedure Laterality Date  . Coronary artery bypass graft    . Myomectomy     Uterine and ovarian  . Breast fibroadenoma surgery    . Hemicolectomy  2005    Left  . Breast lumpectomy  2011  . Right vats (video-assisted thoracoscopic surgery) with      reports that she has never smoked. She does not have any smokeless tobacco history on file. She reports that she does not drink alcohol or use illicit drugs. family history includes Arthritis in her mother; Colon cancer in her mother; Coronary artery disease in an unspecified family member; and Heart disease in her father and mother. Allergies  Allergen Reactions  . Baclofen     REACTION: confusion  . Belladonna   . Ciprofloxacin   . Doxycycline   . Erythromycin   . Gabapentin   . Iohexol      Code: RASH   . Metoclopramide     Clonic spasms  . Metronidazole     REACTION: rash  . Nalbuphine   . Nitrofurantoin     REACTION: nausea  . Penicillins   . Procaine Hcl   . Sucralfate     REACTION: nausea  . Sulfonamide Derivatives   . Methenamine Rash   Current Outpatient Prescriptions on File Prior to Visit  Medication Sig Dispense Refill  . anastrozole (ARIMIDEX) 1 MG tablet Take  1 tablet (1 mg total) by mouth daily.  90 tablet  3  . clotrimazole-betamethasone (LOTRISONE) cream Apply topically 2 (two) times daily.  90 g  3  . conjugated estrogens (PREMARIN) vaginal cream Place 0.5 Applicatorfuls vaginally 2 (two) times a week.  42.5 g  5  . [facility] ALLERGY 25 MG capsule TAKE ONE CAPSULE BY MOUTH EVERY 6 HOURS AS NEEDED ITCHING  60 capsule  1  . diphenhydrAMINE (BENADRYL) 25 mg capsule Take 1 capsule (25 mg total) by mouth every 6 (six) hours as needed for itching.  60 capsule  5  . divalproex (DEPAKOTE) 250 MG DR tablet Take 1 tablet (250 mg total) by mouth 2 (two) times daily.  60 tablet  11  . DULoxetine (CYMBALTA) 30 MG capsule Take 1 capsule (30 mg total) by mouth daily.  90 capsule  3  . indapamide (LOZOL) 2.5 MG tablet Take 1 tablet (2.5 mg total) by mouth every morning.  90 tablet  3  . KLOR-CON M10 10  MEQ tablet TAKE 1 TABLET BY MOUTH 2 TIMES A DAY  60 tablet  5  . Lactulose 20 GM/30ML SOLN Take 30-60 mLs by mouth as needed.        . loratadine (CLARITIN) 10 MG tablet Take 1 tablet (10 mg total) by mouth daily.  100 tablet  3  . LORazepam (ATIVAN) 1 MG tablet Take 1 tablet (1 mg total) by mouth every 8 (eight) hours as needed for anxiety.  270 tablet  1  . metoCLOPramide (REGLAN) 5 MG tablet TAKE 1 TABLET BY MOUTH 3 TIMES A DAY BEFORE MEALS  90 tablet  1  . metoprolol succinate (TOPROL-XL) 25 MG 24 hr tablet Take 1 tablet (25 mg total) by mouth daily.  90 tablet  3  . mometasone (ELOCON) 0.1 % ointment Apply topically 2 (two) times daily. Apply to vaginal area  45 g  1  . omeprazole (PRILOSEC) 20 MG capsule Take 1 capsule (20 mg total) by mouth 2 (two) times daily.  180 capsule  3  . Pancrelipase, Lip-Prot-Amyl, (CREON) 24000 UNITS CPEP Take 1 capsule (24,000 Units total) by mouth 3 (three) times daily with meals.  90 capsule  5  . promethazine (PHENERGAN) 25 MG tablet Take 1 tablet (25 mg total) by mouth every 8 (eight) hours as needed for nausea.  60 tablet  5  . Tamsulosin HCl (FLOMAX) 0.4 MG CAPS Take 1 capsule (0.4 mg total) by mouth daily.  90 capsule  3  . trimethoprim (TRIMPEX) 100 MG tablet Take 1 tablet (100 mg total) by mouth daily.  90 tablet  3  . Vitamin D, Ergocalciferol, (DRISDOL) 50000 UNITS CAPS Take 1 capsule (50,000 Units total) by mouth every 30 (thirty) days.  6 capsule  1  . zolpidem (AMBIEN) 10 MG tablet TAKE 1 TABLET AT BEDTIME AS NEEDED  90 tablet  1   No current facility-administered medications on file prior to visit.      Review of Systems  Constitutional: Positive for fatigue. Negative for activity change, appetite change and unexpected weight change.  HENT: Negative for congestion, mouth sores and sinus pressure.   Eyes: Negative for visual disturbance.  Respiratory: Negative for cough and chest tightness.   Gastrointestinal: Positive for constipation.  Negative for abdominal pain, diarrhea and blood in stool.  Genitourinary: Negative for difficulty urinating and vaginal pain.  Musculoskeletal: Negative for back pain and gait problem.  Skin: Negative for pallor and rash.  Neurological: Negative for dizziness,  tremors, weakness, numbness and headaches.  Psychiatric/Behavioral: Positive for behavioral problems, sleep disturbance, dysphoric mood and decreased concentration. Negative for suicidal ideas, hallucinations, confusion and agitation. The patient is nervous/anxious.        Objective:   Physical Exam  Constitutional: She appears well-developed. No distress.  Chron ill appearing  HENT:  Head: Normocephalic.  Right Ear: External ear normal.  Left Ear: External ear normal.  Nose: Nose normal.  Mouth/Throat: Oropharynx is clear and moist.  Eyes: Conjunctivae are normal. Pupils are equal, round, and reactive to light. Right eye exhibits no discharge. Left eye exhibits no discharge.  Neck: Normal range of motion. Neck supple. No JVD present. No tracheal deviation present. No thyromegaly present.  Cardiovascular: Normal rate, regular rhythm and normal heart sounds.   Pulmonary/Chest: No stridor. No respiratory distress. She has no wheezes.  Abdominal: Soft. Bowel sounds are normal. She exhibits no distension and no mass. There is tenderness (mild). There is no rebound and no guarding.  Musculoskeletal: She exhibits no edema and no tenderness.  Lymphadenopathy:    She has no cervical adenopathy.  Neurological: She displays normal reflexes. No cranial nerve deficit. She exhibits normal muscle tone. Coordination normal.  Skin: No rash noted. No erythema.  Psychiatric: Her behavior is normal. Thought content normal.  A/o/c depressed     Lab Results  Component Value Date   WBC 6.3 06/02/2012   HGB 11.0* 06/02/2012   HCT 33.1* 06/02/2012   PLT 202.0 06/02/2012   GLUCOSE 66* 06/02/2012   CHOL 302* 10/07/2009   TRIG 274.0* 10/07/2009   HDL  51.50 10/07/2009   LDLDIRECT 195.8 10/07/2009   LDLCALC  Value: 280        Total Cholesterol/HDL:CHD Risk Coronary Heart Disease Risk Table                     Men   Women  1/2 Average Risk   3.4   3.3  Average Risk       5.0   4.4  2 X Average Risk   9.6   7.1  3 X Average Risk  23.4   11.0        Use the calculated Patient Ratio above and the CHD Risk Table to determine the patient's CHD Risk.        ATP III CLASSIFICATION (LDL):  <100     mg/dL   Optimal  191-478  mg/dL   Near or Above                    Optimal  130-159  mg/dL   Borderline  295-621  mg/dL   High  >308     mg/dL   Very High* 65/11/8467   ALT 17 06/02/2012   AST 24 06/02/2012   NA 135 06/02/2012   K 4.4 06/02/2012   CL 101 06/02/2012   CREATININE 1.3* 06/02/2012   BUN 26* 06/02/2012   CO2 30 06/02/2012   TSH 3.09 02/29/2012   INR 1.02 12/25/2010       It is a complex case with a lot of self-overmedicating for abdominal pain and "bowel fixation" issues     Assessment & Plan:

## 2013-01-15 NOTE — Assessment & Plan Note (Signed)
Gluten free trial (no wheat products) for 4-6 weeks. OK to use gluten-free bread and gluten-free pasta.  Milk free trial (no milk, ice cream, cheese and yogurt) for 4-6 weeks. OK to use almond, coconut, rice or soy milk. "Almond breeze" brand tastes good.  

## 2013-01-15 NOTE — Assessment & Plan Note (Signed)
OK to use extra Lorazepam if agitated

## 2013-01-15 NOTE — Assessment & Plan Note (Signed)
Mild.

## 2013-01-21 ENCOUNTER — Encounter (HOSPITAL_COMMUNITY): Payer: Self-pay | Admitting: Physical Medicine and Rehabilitation

## 2013-01-21 ENCOUNTER — Emergency Department (HOSPITAL_COMMUNITY): Payer: Medicare Other

## 2013-01-21 ENCOUNTER — Emergency Department (HOSPITAL_COMMUNITY)
Admission: EM | Admit: 2013-01-21 | Discharge: 2013-01-21 | Disposition: A | Discharge status: Home / Self Care | Payer: Medicare Other | Attending: Emergency Medicine | Admitting: Emergency Medicine

## 2013-01-21 DIAGNOSIS — Z860100 Personal history of colon polyps, unspecified: Secondary | ICD-10-CM | POA: Insufficient documentation

## 2013-01-21 DIAGNOSIS — Z87448 Personal history of other diseases of urinary system: Secondary | ICD-10-CM | POA: Insufficient documentation

## 2013-01-21 DIAGNOSIS — Z8639 Personal history of other endocrine, nutritional and metabolic disease: Secondary | ICD-10-CM | POA: Insufficient documentation

## 2013-01-21 DIAGNOSIS — F3289 Other specified depressive episodes: Secondary | ICD-10-CM | POA: Insufficient documentation

## 2013-01-21 DIAGNOSIS — Z853 Personal history of malignant neoplasm of breast: Secondary | ICD-10-CM | POA: Insufficient documentation

## 2013-01-21 DIAGNOSIS — K219 Gastro-esophageal reflux disease without esophagitis: Secondary | ICD-10-CM | POA: Insufficient documentation

## 2013-01-21 DIAGNOSIS — Z951 Presence of aortocoronary bypass graft: Secondary | ICD-10-CM | POA: Insufficient documentation

## 2013-01-21 DIAGNOSIS — Z8619 Personal history of other infectious and parasitic diseases: Secondary | ICD-10-CM | POA: Insufficient documentation

## 2013-01-21 DIAGNOSIS — Z862 Personal history of diseases of the blood and blood-forming organs and certain disorders involving the immune mechanism: Secondary | ICD-10-CM | POA: Insufficient documentation

## 2013-01-21 DIAGNOSIS — Z79899 Other long term (current) drug therapy: Secondary | ICD-10-CM | POA: Insufficient documentation

## 2013-01-21 DIAGNOSIS — I2510 Atherosclerotic heart disease of native coronary artery without angina pectoris: Secondary | ICD-10-CM | POA: Insufficient documentation

## 2013-01-21 DIAGNOSIS — Z8709 Personal history of other diseases of the respiratory system: Secondary | ICD-10-CM | POA: Insufficient documentation

## 2013-01-21 DIAGNOSIS — F411 Generalized anxiety disorder: Secondary | ICD-10-CM | POA: Insufficient documentation

## 2013-01-21 DIAGNOSIS — Z880 Allergy status to penicillin: Secondary | ICD-10-CM | POA: Insufficient documentation

## 2013-01-21 DIAGNOSIS — R112 Nausea with vomiting, unspecified: Secondary | ICD-10-CM | POA: Insufficient documentation

## 2013-01-21 DIAGNOSIS — D649 Anemia, unspecified: Secondary | ICD-10-CM

## 2013-01-21 DIAGNOSIS — F329 Major depressive disorder, single episode, unspecified: Secondary | ICD-10-CM | POA: Insufficient documentation

## 2013-01-21 DIAGNOSIS — J189 Pneumonia, unspecified organism: Secondary | ICD-10-CM

## 2013-01-21 DIAGNOSIS — R5381 Other malaise: Secondary | ICD-10-CM | POA: Insufficient documentation

## 2013-01-21 DIAGNOSIS — J159 Unspecified bacterial pneumonia: Secondary | ICD-10-CM | POA: Insufficient documentation

## 2013-01-21 DIAGNOSIS — G8929 Other chronic pain: Secondary | ICD-10-CM | POA: Insufficient documentation

## 2013-01-21 DIAGNOSIS — Z87440 Personal history of urinary (tract) infections: Secondary | ICD-10-CM | POA: Insufficient documentation

## 2013-01-21 DIAGNOSIS — R531 Weakness: Secondary | ICD-10-CM

## 2013-01-21 LAB — CBC WITH DIFFERENTIAL/PLATELET
Eosinophils Relative: 9 % — ABNORMAL HIGH (ref 0–5)
HCT: 29 % — ABNORMAL LOW (ref 36.0–46.0)
Hemoglobin: 9.8 g/dL — ABNORMAL LOW (ref 12.0–15.0)
Lymphocytes Relative: 18 % (ref 12–46)
Lymphs Abs: 1.3 10*3/uL (ref 0.7–4.0)
MCV: 93.9 fL (ref 78.0–100.0)
Monocytes Absolute: 0.5 10*3/uL (ref 0.1–1.0)
Monocytes Relative: 7 % (ref 3–12)
Neutro Abs: 4.9 10*3/uL (ref 1.7–7.7)
RBC: 3.09 MIL/uL — ABNORMAL LOW (ref 3.87–5.11)
RDW: 13.3 % (ref 11.5–15.5)
WBC: 7.4 10*3/uL (ref 4.0–10.5)

## 2013-01-21 LAB — COMPREHENSIVE METABOLIC PANEL
BUN: 24 mg/dL — ABNORMAL HIGH (ref 6–23)
CO2: 28 mEq/L (ref 19–32)
Calcium: 8.3 mg/dL — ABNORMAL LOW (ref 8.4–10.5)
Chloride: 99 mEq/L (ref 96–112)
Creatinine, Ser: 1.28 mg/dL — ABNORMAL HIGH (ref 0.50–1.10)
GFR calc Af Amer: 44 mL/min — ABNORMAL LOW (ref >90)
GFR calc non Af Amer: 38 mL/min — ABNORMAL LOW (ref >90)
Glucose, Bld: 122 mg/dL — ABNORMAL HIGH (ref 70–99)
Total Bilirubin: 0.3 mg/dL (ref 0.3–1.2)

## 2013-01-21 LAB — URINALYSIS, ROUTINE W REFLEX MICROSCOPIC
Nitrite: POSITIVE — AB
Protein, ur: NEGATIVE mg/dL
Specific Gravity, Urine: 1.015 (ref 1.005–1.030)
Urobilinogen, UA: 0.2 mg/dL (ref 0.0–1.0)

## 2013-01-21 LAB — URINE MICROSCOPIC-ADD ON

## 2013-01-21 MED ORDER — CEFUROXIME AXETIL 500 MG PO TABS: 500.0000 mg | ORAL_TABLET | Freq: Two times a day (BID) | ORAL | Status: DC

## 2013-01-21 MED ORDER — SODIUM CHLORIDE 0.9 % IV BOLUS (SEPSIS): 1000.0000 mL | Freq: Once | INTRAVENOUS | Status: DC

## 2013-01-21 MED ORDER — SODIUM CHLORIDE 0.9 % IV BOLUS (SEPSIS)
500.0000 mL | Freq: Once | INTRAVENOUS | Status: AC
  Administered 2013-01-21: 500 mL via INTRAVENOUS

## 2013-01-21 MED ORDER — ONDANSETRON HCL 4 MG/2ML IJ SOLN
4.0000 mg | Freq: Once | INTRAMUSCULAR | Status: AC
  Administered 2013-01-21: 4 mg via INTRAVENOUS
  Filled 2013-01-21: qty 2

## 2013-01-21 NOTE — ED Provider Notes (Addendum)
CSN: [id number]     Arrival date & time 01/21/13  1258 History   First MD Initiated Contact with Patient 01/21/13 1329     Chief Complaint  Patient presents with  . Weakness   (Consider location/radiation/quality/duration/timing/severity/associated sxs/prior Treatment) HPI Comments: 79 yo female with chronic pain, on methadone, electrolyte hx, H pylori, ARF, breast CA, recurrent UTI presents with general weakness and mild confusion this am.  Pt felt generally weak and needed assistance to prevent falling by husband.  Daughter comfortable translating.  Recently finished UTI abx yesterday.  Denies pain or medicine changes.  No fevers or cough or abd pain.  Improved since arriving to ED.  No stroke hx or focal deficits noticed.  No head injury.    Patient is a 79 y.o. female presenting with weakness. The history is provided by the patient and a relative. The history is limited by a language barrier.  Weakness This is a new problem. Pertinent negatives include no chest pain, no abdominal pain, no headaches and no shortness of breath.    Past Medical History  Diagnosis Date  . CAD (coronary artery disease)     Catheterization, January, 2007 patent grafts / hospitalization October 2 010 no MI  . Syncope 03/2009    ?orthostasis  . Allergic rhinitis   . History of colon polyps   . Diverticulosis of colon   . GERD (gastroesophageal reflux disease)   . Hyperlipidemia   . Abdominal  pain, other specified site     motility disorder, chorinic functional, severe (Dr Juanda Chance). Possible OCD w/complusive use of enemas and laxatives; psychotic features 2011  . Positive H. pylori test 2911  . Psychosomatic disorder     w/GI fixation  . Anxiety   . Depression   . Stress incontinence, female   . Vitamin D deficiency   . Vitamin B12 deficiency   . Renal insufficiency   . UTI (urinary tract infection)   . Hypokalemia     mild  . Insomnia   . Carotid artery disease     Doppler November, 2010, 0-39%  LICA, 60-79% R. ICA.Marland Kitchen increased velocity... possibly from serpentine vessel  . Ejection fraction     50-55%, no wall motion abnormalities, echo, November, 2010  . Edema   . Hemothorax on right   . Breast cancer 2011    Dr Welton Flakes- right breast   Past Surgical History  Procedure Laterality Date  . Coronary artery bypass graft    . Myomectomy      Uterine and ovarian  . Breast fibroadenoma surgery    . Hemicolectomy  2005    Left  . Breast lumpectomy  2011  . Right vats (video-assisted thoracoscopic surgery) with     Family History  Problem Relation Age of Onset  . Coronary artery disease      Female 1st degree relative <60  . Colon cancer Mother   . Arthritis Mother   . Heart disease Mother   . Heart disease Father    History  Substance Use Topics  . Smoking status: Never Smoker   . Smokeless tobacco: Not on file  . Alcohol Use: No   OB History   Grav Para Term Preterm Abortions TAB SAB Ect Mult Living                 Review of Systems  Constitutional: Negative for fever and chills.  HENT: Negative for neck pain and neck stiffness.   Eyes: Negative for visual disturbance.  Respiratory:  Negative for shortness of breath.   Cardiovascular: Negative for chest pain.  Gastrointestinal: Positive for nausea and vomiting (once, non bloody). Negative for abdominal pain.  Genitourinary: Negative for dysuria and flank pain.  Musculoskeletal: Positive for gait problem. Negative for back pain.  Skin: Negative for rash.  Neurological: Positive for weakness. Negative for light-headedness, numbness and headaches.    Allergies  Baclofen; Belladonna; Ciprofloxacin; Doxycycline; Erythromycin; Gabapentin; Iohexol; Metoclopramide; Metronidazole; Nalbuphine; Nitrofurantoin; Penicillins; Procaine hcl; Sucralfate; Sulfonamide derivatives; and Methenamine  Home Medications   Current Outpatient Rx  Name  Route  Sig  Dispense  Refill  . anastrozole (ARIMIDEX) 1 MG tablet   Oral   Take 1  tablet (1 mg total) by mouth daily.   90 tablet   3   . conjugated estrogens (PREMARIN) vaginal cream   Vaginal   Place 0.5 Applicatorfuls vaginally 2 (two) times a week.   42.5 g   5   . divalproex (DEPAKOTE) 250 MG DR tablet   Oral   Take 1 tablet (250 mg total) by mouth 2 (two) times daily.   60 tablet   11   . DULoxetine (CYMBALTA) 30 MG capsule   Oral   Take 1 capsule (30 mg total) by mouth daily.   90 capsule   3   . indapamide (LOZOL) 2.5 MG tablet   Oral   Take 2.5 mg by mouth daily as needed. Only takes if blood pressure is high         . LORazepam (ATIVAN) 1 MG tablet   Oral   Take 1 tablet (1 mg total) by mouth every 8 (eight) hours as needed for anxiety.   270 tablet   1   . methadone (DOLOPHINE) 10 MG tablet   Oral   Take 1 tablet (10 mg total) by mouth 5 (five) times daily as needed. Fill on or after 01/29/13. Once every 30 days   150 tablet   0   . metoprolol succinate (TOPROL-XL) 25 MG 24 hr tablet   Oral   Take 1 tablet (25 mg total) by mouth daily.   90 tablet   3   . omeprazole (PRILOSEC) 20 MG capsule   Oral   Take 1 capsule (20 mg total) by mouth 2 (two) times daily.   180 capsule   3   . potassium chloride (KLOR-CON M10) 10 MEQ tablet   Oral   Take 1 tablet (10 mEq total) by mouth 2 (two) times daily.   60 tablet   5   . promethazine (PHENERGAN) 25 MG tablet   Oral   Take 1 tablet (25 mg total) by mouth every 8 (eight) hours as needed for nausea.   60 tablet   5   . tamsulosin (FLOMAX) 0.4 MG CAPS   Oral   Take 1 capsule (0.4 mg total) by mouth daily.   90 capsule   3   . trimethoprim (TRIMPEX) 100 MG tablet   Oral   Take 1 tablet (100 mg total) by mouth daily.   90 tablet   3   . Vitamin D, Ergocalciferol, (DRISDOL) 50000 UNITS CAPS   Oral   Take 1 capsule (50,000 Units total) by mouth every 30 (thirty) days.   6 capsule   1   . zolpidem (AMBIEN) 10 MG tablet   Oral   Take 1 tablet (10 mg total) by mouth at  bedtime as needed for sleep.   90 tablet   1    BP  131/57  Pulse 62  Temp(Src) 98.6 F (37 C) (Oral)  Resp 18  SpO2 93% Physical Exam  Nursing note and vitals reviewed. Constitutional: She is oriented to person, place, and time. She appears well-developed and well-nourished.  HENT:  Head: Normocephalic and atraumatic.  Dry mm  Eyes: Conjunctivae are normal. Right eye exhibits no discharge. Left eye exhibits no discharge.  Neck: Normal range of motion. Neck supple. No tracheal deviation present.  Cardiovascular: Normal rate and regular rhythm.   Pulmonary/Chest: Effort normal and breath sounds normal.  Abdominal: Soft. She exhibits no distension. There is no tenderness. There is no guarding.  Musculoskeletal: She exhibits no edema.  Neurological: She is alert and oriented to person, place, and time. GCS eye subscore is 4. GCS verbal subscore is 5. GCS motor subscore is 6.  Normal strength for age in UE and LE with f/e at major joints. Sensation to palpation intact in UE and LE. CNs 2-12 grossly intact.  EOMFI.  PERRL.   Finger nose and coordination intact bilateral.   Visual fields intact to finger testing.   Skin: Skin is warm. No rash noted.  Psychiatric: She has a normal mood and affect.    ED Course  Procedures (including critical care time) Labs Review Labs Reviewed  CBC WITH DIFFERENTIAL - Abnormal; Notable for the following:    RBC 3.09 (*)    Hemoglobin 9.8 (*)    HCT 29.0 (*)    Eosinophils Relative 9 (*)    All other components within normal limits  COMPREHENSIVE METABOLIC PANEL - Abnormal; Notable for the following:    Glucose, Bld 122 (*)    BUN 24 (*)    Creatinine, Ser 1.28 (*)    Calcium 8.3 (*)    Albumin 3.3 (*)    Alkaline Phosphatase 166 (*)    GFR calc non Af Amer 38 (*)    GFR calc Af Amer 44 (*)    All other components within normal limits  URINALYSIS, ROUTINE W REFLEX MICROSCOPIC   Imaging Review No results found.  MDM  No diagnosis  found. Concern for metabolic vs UTI.  Less likely stroke or cardiac.   Fluids given. Pt answers all questions appropriate, normal neuro except mild gen weakness. Plan for fluids, labs and UA.  Family with pt.  Anemia similar to previous.   Dg Chest 2 View  01/21/2013   CLINICAL DATA:  Coronary artery disease and history of breast carcinoma, presenting with weakness  EXAM: CHEST  2 VIEW  COMPARISON:  March 21, 2011  FINDINGS: There is a small area of patchy infiltrate in the left base. There is scarring in both lung bases. There is a small granuloma in the left lower lobe.  Elsewhere, lungs are clear. Heart is upper normal in size with normal pulmonary vascularity. Patient is status post coronary artery bypass grafting. Aorta is mildly prominent and tortuous but stable. There is degenerative change in the thoracic spine. Anterior wedging of several midthoracic vertebral bodies is stable.  IMPRESSION: Small area of infiltrate left base.   Electronically Signed   By: Bretta Bang   On: 01/21/2013 14:42   Rechecked, pt feels well, asking to go home, family agrees she looks well.  Discussed results, possible pneumonia. Pt has multiple allergies.  Pt does not want to wait for urine.  Plan  To treat with abx to cover both cap and uti, close fup stressed.  Family understands.   CAP, GEneral weakness, dehydration, anemia Araceli Bouche  Jodi Mourning, MD 01/21/13 4098  Enid Skeens, MD 01/21/13 [phone]

## 2013-01-21 NOTE — ED Notes (Signed)
Pt remains in x-ray at the time.

## 2013-01-21 NOTE — ED Notes (Signed)
Pt presents to department via [facility] from home for evaluation of generalized weakness. Was recently seen and treated for UTI, daughter states she became very weak and began vomiting this morning. History of chronic abdominal pain, taking methadone. 20g LAC. Pt speaks russian, family at bedside to translate.

## 2013-01-21 NOTE — ED Notes (Addendum)
Family states patient became weak this morning and was having trouble walking. Recently finished treatment for UTI. Family also states vomiting this morning. Denies any new pain at the time. She is alert, but speaks russian, family able to translate at the time. Able to move all extremities. No neurological deficits noted.

## 2013-01-21 NOTE — ED Notes (Addendum)
IV has been removed from left ac. Bleeding controlled 2x2 applied with tape.

## 2013-01-21 NOTE — ED Notes (Signed)
Patient used bedpan didn't have to do in and out.

## 2013-01-24 LAB — URINE CULTURE: Colony Count: >=100000

## 2013-01-25 ENCOUNTER — Telehealth (HOSPITAL_COMMUNITY): Payer: Self-pay | Admitting: Emergency Medicine

## 2013-01-25 NOTE — ED Notes (Signed)
Post ED Visit - Positive Culture Follow-up  Culture report reviewed by antimicrobial stewardship pharmacist: [x]  Wes Dulaney, Pharm.D., BCPS []  Celedonio Miyamoto, Pharm.D., BCPS []  Georgina Pillion, Pharm.D., BCPS []  [place], [address].D., BCPS, AAHIVP []  Estella Husk, Pharm.D., BCPS, AAHIVP  Positive urine culture Treated with Cefuroxime Axetil, organism sensitive to the same and no further patient follow-up is required at this time.  Kylie A Holland 01/25/2013, 5:07 PM

## 2013-02-03 ENCOUNTER — Ambulatory Visit (INDEPENDENT_AMBULATORY_CARE_PROVIDER_SITE_OTHER): Payer: Medicare Other | Admitting: Internal Medicine

## 2013-02-03 ENCOUNTER — Encounter: Payer: Self-pay | Admitting: Internal Medicine

## 2013-02-03 VITALS — BP 132/70 | HR 72 | Temp 99.3°F | Resp 16 | Wt 150.0 lb

## 2013-02-03 DIAGNOSIS — F329 Major depressive disorder, single episode, unspecified: Secondary | ICD-10-CM

## 2013-02-03 DIAGNOSIS — F429 Obsessive-compulsive disorder, unspecified: Secondary | ICD-10-CM

## 2013-02-03 DIAGNOSIS — R109 Unspecified abdominal pain: Secondary | ICD-10-CM

## 2013-02-03 DIAGNOSIS — G894 Chronic pain syndrome: Secondary | ICD-10-CM

## 2013-02-03 MED ORDER — ZOLPIDEM TARTRATE 10 MG PO TABS: 10.0000 mg | ORAL_TABLET | Freq: Every evening | ORAL | Status: DC | PRN

## 2013-02-03 MED ORDER — DIVALPROEX SODIUM 250 MG PO DR TAB: DELAYED_RELEASE_TABLET | ORAL | Status: DC

## 2013-02-03 MED ORDER — PROMETHAZINE HCL 25 MG PO TABS: 25.0000 mg | ORAL_TABLET | Freq: Three times a day (TID) | ORAL | Status: AC | PRN

## 2013-02-03 MED ORDER — OMEPRAZOLE 20 MG PO CPDR: 20.0000 mg | DELAYED_RELEASE_CAPSULE | Freq: Two times a day (BID) | ORAL | Status: AC

## 2013-02-03 MED ORDER — LORAZEPAM 1 MG PO TABS: 1.0000 mg | ORAL_TABLET | Freq: Three times a day (TID) | ORAL | Status: DC | PRN

## 2013-02-03 MED ORDER — METHADONE HCL 10 MG PO TABS: 10.0000 mg | ORAL_TABLET | Freq: Every day | ORAL | Status: DC | PRN

## 2013-02-03 MED ORDER — METOPROLOL SUCCINATE ER 25 MG PO TB24: 25.0000 mg | ORAL_TABLET | Freq: Every day | ORAL | Status: DC

## 2013-02-03 MED ORDER — TRIMETHOPRIM 100 MG PO TABS: 100.0000 mg | ORAL_TABLET | Freq: Every day | ORAL | Status: DC

## 2013-02-03 MED ORDER — INDAPAMIDE 2.5 MG PO TABS: 2.5000 mg | ORAL_TABLET | Freq: Every day | ORAL | Status: DC | PRN

## 2013-02-04 ENCOUNTER — Telehealth: Payer: Self-pay | Admitting: *Deleted

## 2013-02-04 NOTE — Assessment & Plan Note (Signed)
Continue with current prescription therapy as reflected on the Med list.  

## 2013-02-04 NOTE — Telephone Encounter (Signed)
[facility] Triage Call Report Triage Record Num: [id number] Operator: Claudie Leach Patient Name: [name] Call Date & Time: 02/03/2013 8:22:17PM Patient Phone: [phone] PCP: [name] Patient Gender: Female PCP Fax : [phone] Patient DOB: 03/21/1928 Practice Name: [facility] Reason for Call: Caller: [place]/Other; PCP: [name] (Adults only); CB#: [phone]; Call regarding blood pressure 191/75 missed call back; Blood pressure was 191/75 at 7:15 pm on 02/03/13. States the patient was seen in the office on 02/03/13 and the blood pressure was 131/75. Is having heart burn. Denies chest pain or headache. States the patient last took Metorpolol XL 25 mg and Indapamide 2.5 mg at 2:30 pm. Triaged per Hypertension, Diagnosed or Suspected guideline. To see provider within 4 hours due to systolic blood pressure of more than 180 mmhg or diastolic blood pressure of more than 120 mmhg. Care advice given. Notified Dr. Cato Mulligan and orders given to take another Metoprolol XL 25 mg now and to call the office in the am. Caller made aware and agreed. Protocol(s) Used: Hypertension, Diagnosed or Suspected Recommended Outcome per Protocol: See Provider within 4 hours Reason for Outcome: Systolic blood pressure of more than 180 mmHg OR diastolic blood pressure of more than 120 mmHg Care Advice: Call EMS 911 if new symptoms develop, such as severe shortness of breath, chest pain, change in mental status, acute neurologic deficit, seizure, visual disturbances, pulse rate > 120 / minute, or very irregular pulse. ~ ~ Call provider if symptoms worsen or new symptoms develop. ~ HEALTH PROMOTION / MAINTENANCE ~ CAUTIONS 09/

## 2013-02-04 NOTE — Assessment & Plan Note (Signed)
Depakote - take 1 in am and 2 at hs to help w/nighttime agitation

## 2013-02-04 NOTE — Progress Notes (Signed)
Subjective:    HPI F/u ED visit on 01/12/13. C/o episodic night time restlessness, confusion. No meds overuse lately. Agitated at night  The patient is here to follow up on chronic paranoid depression, anxiety,  and chronic moderate rectal pain symptoms controlled with medicines some. C/o chronic severe abd pain. F/u spasms in legs and arms at night; not sleeping  Wt Readings from Last 3 Encounters:  02/03/13 150 lb (68.04 kg)  01/15/13 145 lb (65.772 kg)  12/02/12 141 lb (63.957 kg)     BP Readings from Last 3 Encounters:  02/03/13 132/70  01/21/13 133/59  01/15/13 160/80     BP 132/70  Pulse 72  Temp(Src) 99.3 F (37.4 C) (Oral)  Resp 16  Wt 150 lb (68.04 kg)  BMI 24.96 kg/m2   Past Medical History  Diagnosis Date  . CAD (coronary artery disease)     Catheterization, January, 2007 patent grafts / hospitalization October 2 010 no MI  . Syncope 03/2009    ?orthostasis  . Allergic rhinitis   . History of colon polyps   . Diverticulosis of colon   . GERD (gastroesophageal reflux disease)   . Hyperlipidemia   . Abdominal  pain, other specified site     motility disorder, chorinic functional, severe (Dr Juanda Chance). Possible OCD w/complusive use of enemas and laxatives; psychotic features 2011  . Positive H. pylori test 2911  . Psychosomatic disorder     w/GI fixation  . Anxiety   . Depression   . Stress incontinence, female   . Vitamin D deficiency   . Vitamin B12 deficiency   . Renal insufficiency   . UTI (urinary tract infection)   . Hypokalemia     mild  . Insomnia   . Carotid artery disease     Doppler November, 1610, 0-39% LICA, 60-79% R. ICA.Marland Kitchen increased velocity... possibly from serpentine vessel  . Ejection fraction     50-55%, no wall motion abnormalities, echo, November, 2010  . Edema   . Hemothorax on right   . Breast cancer 2011    Dr Welton Flakes- right breast   Past Surgical History  Procedure Laterality Date  . Coronary artery bypass graft    .  Myomectomy      Uterine and ovarian  . Breast fibroadenoma surgery    . Hemicolectomy  2005    Left  . Breast lumpectomy  2011  . Right vats (video-assisted thoracoscopic surgery) with      reports that she has never smoked. She does not have any smokeless tobacco history on file. She reports that she does not drink alcohol or use illicit drugs. family history includes Arthritis in her mother; Colon cancer in her mother; Coronary artery disease in an unspecified family member; and Heart disease in her father and mother. Allergies  Allergen Reactions  . Baclofen     REACTION: confusion  . Belladonna   . Ciprofloxacin   . Doxycycline   . Erythromycin   . Gabapentin   . Iohexol      Code: RASH   . Metoclopramide     Clonic spasms  . Metronidazole     REACTION: rash  . Nalbuphine   . Nitrofurantoin     REACTION: nausea  . Penicillins   . Procaine Hcl   . Sucralfate     REACTION: nausea  . Sulfonamide Derivatives   . Methenamine Rash   Current Outpatient Prescriptions on File Prior to Visit  Medication Sig Dispense Refill  .  anastrozole (ARIMIDEX) 1 MG tablet Take 1 tablet (1 mg total) by mouth daily.  90 tablet  3  . clotrimazole-betamethasone (LOTRISONE) cream Apply topically 2 (two) times daily.  90 g  3  . conjugated estrogens (PREMARIN) vaginal cream Place 0.5 Applicatorfuls vaginally 2 (two) times a week.  42.5 g  5  . [employer] ALLERGY 25 MG capsule TAKE ONE CAPSULE BY MOUTH EVERY 6 HOURS AS NEEDED ITCHING  60 capsule  1  . diphenhydrAMINE (BENADRYL) 25 mg capsule Take 1 capsule (25 mg total) by mouth every 6 (six) hours as needed for itching.  60 capsule  5  . divalproex (DEPAKOTE) 250 MG DR tablet Take 1 tablet (250 mg total) by mouth 2 (two) times daily.  60 tablet  11  . DULoxetine (CYMBALTA) 30 MG capsule Take 1 capsule (30 mg total) by mouth daily.  90 capsule  3  . indapamide (LOZOL) 2.5 MG tablet Take 1 tablet (2.5 mg total) by mouth every morning.  90 tablet  3  .  KLOR-CON M10 10 MEQ tablet TAKE 1 TABLET BY MOUTH 2 TIMES A DAY  60 tablet  5  . Lactulose 20 GM/30ML SOLN Take 30-60 mLs by mouth as needed.        . loratadine (CLARITIN) 10 MG tablet Take 1 tablet (10 mg total) by mouth daily.  100 tablet  3  . LORazepam (ATIVAN) 1 MG tablet Take 1 tablet (1 mg total) by mouth every 8 (eight) hours as needed for anxiety.  270 tablet  1  . metoCLOPramide (REGLAN) 5 MG tablet TAKE 1 TABLET BY MOUTH 3 TIMES A DAY BEFORE MEALS  90 tablet  1  . metoprolol succinate (TOPROL-XL) 25 MG 24 hr tablet Take 1 tablet (25 mg total) by mouth daily.  90 tablet  3  . mometasone (ELOCON) 0.1 % ointment Apply topically 2 (two) times daily. Apply to vaginal area  45 g  1  . omeprazole (PRILOSEC) 20 MG capsule Take 1 capsule (20 mg total) by mouth 2 (two) times daily.  180 capsule  3  . Pancrelipase, Lip-Prot-Amyl, (CREON) 24000 UNITS CPEP Take 1 capsule (24,000 Units total) by mouth 3 (three) times daily with meals.  90 capsule  5  . promethazine (PHENERGAN) 25 MG tablet Take 1 tablet (25 mg total) by mouth every 8 (eight) hours as needed for nausea.  60 tablet  5  . Tamsulosin HCl (FLOMAX) 0.4 MG CAPS Take 1 capsule (0.4 mg total) by mouth daily.  90 capsule  3  . trimethoprim (TRIMPEX) 100 MG tablet Take 1 tablet (100 mg total) by mouth daily.  90 tablet  3  . Vitamin D, Ergocalciferol, (DRISDOL) 50000 UNITS CAPS Take 1 capsule (50,000 Units total) by mouth every 30 (thirty) days.  6 capsule  1  . zolpidem (AMBIEN) 10 MG tablet TAKE 1 TABLET AT BEDTIME AS NEEDED  90 tablet  1   No current facility-administered medications on file prior to visit.      Review of Systems  Constitutional: Positive for fatigue. Negative for activity change, appetite change and unexpected weight change.  HENT: Negative for congestion, mouth sores and sinus pressure.   Eyes: Negative for visual disturbance.  Respiratory: Negative for cough and chest tightness.   Gastrointestinal: Positive for  constipation. Negative for abdominal pain, diarrhea and blood in stool.  Genitourinary: Negative for difficulty urinating and vaginal pain.  Musculoskeletal: Negative for back pain and gait problem.  Skin: Negative for pallor and  rash.  Neurological: Negative for dizziness, tremors, weakness, numbness and headaches.  Psychiatric/Behavioral: Positive for behavioral problems, sleep disturbance, dysphoric mood and decreased concentration. Negative for suicidal ideas, hallucinations, confusion and agitation. The patient is nervous/anxious.        Objective:   Physical Exam  Constitutional: She appears well-developed. No distress.  Chron ill appearing  HENT:  Head: Normocephalic.  Right Ear: External ear normal.  Left Ear: External ear normal.  Nose: Nose normal.  Mouth/Throat: Oropharynx is clear and moist.  Eyes: Conjunctivae are normal. Pupils are equal, round, and reactive to light. Right eye exhibits no discharge. Left eye exhibits no discharge.  Neck: Normal range of motion. Neck supple. No JVD present. No tracheal deviation present. No thyromegaly present.  Cardiovascular: Normal rate, regular rhythm and normal heart sounds.   Pulmonary/Chest: No stridor. No respiratory distress. She has no wheezes.  Abdominal: Soft. Bowel sounds are normal. She exhibits no distension and no mass. There is tenderness (mild). There is no rebound and no guarding.  Musculoskeletal: She exhibits no edema and no tenderness.  Lymphadenopathy:    She has no cervical adenopathy.  Neurological: She displays normal reflexes. No cranial nerve deficit. She exhibits normal muscle tone. Coordination normal.  Skin: No rash noted. No erythema.  Psychiatric: Her behavior is normal. Thought content normal.  A/o/c depressed     Lab Results  Component Value Date   WBC 6.3 06/02/2012   HGB 11.0* 06/02/2012   HCT 33.1* 06/02/2012   PLT 202.0 06/02/2012   GLUCOSE 66* 06/02/2012   CHOL 302* 10/07/2009   TRIG 274.0*  10/07/2009   HDL 51.50 10/07/2009   LDLDIRECT 195.8 10/07/2009   LDLCALC  Value: 280        Total Cholesterol/HDL:CHD Risk Coronary Heart Disease Risk Table                     Men   Women  1/2 Average Risk   3.4   3.3  Average Risk       5.0   4.4  2 X Average Risk   9.6   7.1  3 X Average Risk  23.4   11.0        Use the calculated Patient Ratio above and the CHD Risk Table to determine the patient's CHD Risk.        ATP III CLASSIFICATION (LDL):  <100     mg/dL   Optimal  478-295  mg/dL   Near or Above                    Optimal  130-159  mg/dL   Borderline  621-308  mg/dL   High  >657     mg/dL   Very High* 84/10/9627   ALT 17 06/02/2012   AST 24 06/02/2012   NA 135 06/02/2012   K 4.4 06/02/2012   CL 101 06/02/2012   CREATININE 1.3* 06/02/2012   BUN 26* 06/02/2012   CO2 30 06/02/2012   TSH 3.09 02/29/2012   INR 1.02 12/25/2010       It is a complex case with a lot of self-overmedicating for abdominal pain and "bowel fixation" issues     Assessment & Plan:

## 2013-03-10 ENCOUNTER — Ambulatory Visit: Payer: Medicare Other | Admitting: Cardiology

## 2013-03-16 ENCOUNTER — Telehealth: Payer: Self-pay | Admitting: Internal Medicine

## 2013-03-16 MED ORDER — DULOXETINE HCL 30 MG PO CPEP: 30.0000 mg | ORAL_CAPSULE | Freq: Every day | ORAL | Status: DC

## 2013-03-16 MED ORDER — METHADONE HCL 10 MG PO TABS: 10.0000 mg | ORAL_TABLET | Freq: Every day | ORAL | Status: DC | PRN

## 2013-03-16 MED ORDER — RISPERIDONE 0.5 MG PO TABS: 0.5000 mg | ORAL_TABLET | Freq: Every day | ORAL | Status: DC

## 2013-03-16 MED ORDER — ZOLPIDEM TARTRATE 10 MG PO TABS: 10.0000 mg | ORAL_TABLET | Freq: Every evening | ORAL | Status: DC | PRN

## 2013-03-16 MED ORDER — TRIMETHOPRIM 100 MG PO TABS: 100.0000 mg | ORAL_TABLET | Freq: Every day | ORAL | Status: DC

## 2013-03-16 NOTE — Telephone Encounter (Signed)
A lot of agitation at night: wondering, stumbling, falling; some confusion. Will try Risperdal

## 2013-03-24 ENCOUNTER — Ambulatory Visit (INDEPENDENT_AMBULATORY_CARE_PROVIDER_SITE_OTHER): Payer: Medicare Other | Admitting: Cardiology

## 2013-03-24 ENCOUNTER — Encounter: Payer: Self-pay | Admitting: Cardiology

## 2013-03-24 VITALS — BP 102/64 | HR 75 | Ht 65.0 in | Wt 151.0 lb

## 2013-03-24 DIAGNOSIS — I2510 Atherosclerotic heart disease of native coronary artery without angina pectoris: Secondary | ICD-10-CM

## 2013-03-24 DIAGNOSIS — E785 Hyperlipidemia, unspecified: Secondary | ICD-10-CM

## 2013-03-24 DIAGNOSIS — I779 Disorder of arteries and arterioles, unspecified: Secondary | ICD-10-CM

## 2013-03-24 NOTE — Patient Instructions (Signed)
Your physician recommends that you continue on your current medications as directed. Please refer to the Current Medication list given to you today.  Your physician wants you to follow-up in: 6 months. You will receive a reminder letter in the mail two months in advance. If you don't receive a letter, please call our office to schedule the follow-up appointment.  

## 2013-03-24 NOTE — Progress Notes (Signed)
HPI  Patient is seen for followup her cardiac disease. Fortunately she's been relatively stable. She is not having any significant chest pain. She has significant carotid disease. Her last Doppler was September, 2014. The plan is for a six-month followup. I explained this carefully to her through her interpreter.  Allergies  Allergen Reactions  . Baclofen     REACTION: confusion  . Belladonna   . Ciprofloxacin   . Doxycycline   . Erythromycin   . Gabapentin   . Iohexol      Code: RASH   . Metoclopramide     Clonic spasms  . Metronidazole     REACTION: rash  . Nalbuphine   . Nitrofurantoin     REACTION: nausea  . Penicillins   . Procaine Hcl   . Sucralfate     REACTION: nausea  . Sulfonamide Derivatives   . Methenamine Rash    Current Outpatient Prescriptions  Medication Sig Dispense Refill  . anastrozole (ARIMIDEX) 1 MG tablet Take 1 tablet (1 mg total) by mouth daily.  90 tablet  3  . cefUROXime (CEFTIN) 500 MG tablet Take 1 tablet (500 mg total) by mouth 2 (two) times daily.  14 tablet  0  . conjugated estrogens (PREMARIN) vaginal cream Place 0.5 Applicatorfuls vaginally 2 (two) times a week.  42.5 g  5  . divalproex (DEPAKOTE) 250 MG DR tablet 1 in am and 2 at hs po  90 tablet  11  . DULoxetine (CYMBALTA) 30 MG capsule Take 1 capsule (30 mg total) by mouth daily.  90 capsule  3  . indapamide (LOZOL) 2.5 MG tablet Take 1 tablet (2.5 mg total) by mouth daily as needed. Only takes if blood pressure is high  30 tablet  11  . LORazepam (ATIVAN) 1 MG tablet Take 1 tablet (1 mg total) by mouth every 8 (eight) hours as needed for anxiety.  270 tablet  1  . methadone (DOLOPHINE) 10 MG tablet Take 1 tablet (10 mg total) by mouth 5 (five) times daily as needed for severe pain. Fill on or after 03/31/13. Once every 30 days  150 tablet  0  . metoprolol succinate (TOPROL-XL) 25 MG 24 hr tablet Take 1 tablet (25 mg total) by mouth daily.  90 tablet  3  . omeprazole (PRILOSEC) 20 MG  capsule Take 1 capsule (20 mg total) by mouth 2 (two) times daily.  180 capsule  3  . potassium chloride (KLOR-CON M10) 10 MEQ tablet Take 1 tablet (10 mEq total) by mouth 2 (two) times daily.  60 tablet  5  . promethazine (PHENERGAN) 25 MG tablet Take 1 tablet (25 mg total) by mouth every 8 (eight) hours as needed for nausea.  60 tablet  5  . tamsulosin (FLOMAX) 0.4 MG CAPS Take 1 capsule (0.4 mg total) by mouth daily.  90 capsule  3  . trimethoprim (TRIMPEX) 100 MG tablet Take 1 tablet (100 mg total) by mouth daily.  90 tablet  3  . Vitamin D, Ergocalciferol, (DRISDOL) 50000 UNITS CAPS Take 1 capsule (50,000 Units total) by mouth every 30 (thirty) days.  6 capsule  1  . zolpidem (AMBIEN) 10 MG tablet Take 1 tablet (10 mg total) by mouth at bedtime as needed for sleep.  90 tablet  1   No current facility-administered medications for this visit.    History   Social History  . Marital Status: Married    Spouse Name: N/A    Number of  Children: N/A  . Years of Education: N/A   Occupational History  . RETIRED MD    Social History Main Topics  . Smoking status: Never Smoker   . Smokeless tobacco: Not on file  . Alcohol Use: No  . Drug Use: No  . Sexual Activity: No   Other Topics Concern  . Not on file   Social History Narrative   Patient is an immigrant from [place]. 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       Currently living w/her husband.     Family History  Problem Relation Age of Onset  . Coronary artery disease      Female 1st degree relative <60  . Colon cancer Mother   . Arthritis Mother   . Heart disease Mother   . Heart disease Father     Past Medical History  Diagnosis Date  . CAD (coronary artery disease)     Catheterization, January, 2007 patent grafts / hospitalization October 2 010 no MI  . Syncope 03/2009    ?orthostasis  . Allergic rhinitis   . History of colon polyps   . Diverticulosis of  colon   . GERD (gastroesophageal reflux disease)   . Hyperlipidemia   . Abdominal  pain, other specified site     motility disorder, chorinic functional, severe (Dr Juanda Chance). Possible OCD w/complusive use of enemas and laxatives; psychotic features 2011  . Positive H. pylori test 2911  . Psychosomatic disorder     w/GI fixation  . Anxiety   . Depression   . Stress incontinence, female   . Vitamin D deficiency   . Vitamin B12 deficiency   . Renal insufficiency   . UTI (urinary tract infection)   . Hypokalemia     mild  . Insomnia   . Carotid artery disease     Doppler November, 4782, 0-39% LICA, 60-79% R. ICA.Marland Kitchen increased velocity... possibly from serpentine vessel  . Ejection fraction     50-55%, no wall motion abnormalities, echo, November, 2010  . Edema   . Hemothorax on right   . Breast cancer 2011    Dr Welton Flakes- right breast    Past Surgical History  Procedure Laterality Date  . Coronary artery bypass graft    . Myomectomy      Uterine and ovarian  . Breast fibroadenoma surgery    . Hemicolectomy  2005    Left  . Breast lumpectomy  2011  . Right vats (video-assisted thoracoscopic surgery) with      Patient Active Problem List   Diagnosis Date Noted  . Facial contusion 10/01/2012  . Actinic keratoses 10/01/2012  . Wart viral 10/01/2012  . Vaginitis and vulvovaginitis 09/06/2011  . Spasm 08/14/2011  . Anemia, iron deficiency 01/26/2011  . Hemothorax on right   . Edema   . CAD (coronary artery disease)   . Hx of CABG   . Carotid artery disease   . Ejection fraction   . BREAST CANCER, HX OF 04/17/2010  . LUMP OR MASS IN BREAST 03/27/2010  . INSOMNIA, CHRONIC 11/08/2009  . HYPONATREMIA 10/26/2009  . PSYCHOSIS 10/26/2009  . OBSESSIVE-COMPULSIVE DISORDER 10/26/2009  . FREQUENCY, URINARY 10/26/2009  . HELICOBACTER PYLORI INFECTION 08/31/2009  . DYSPHAGIA UNSPECIFIED 07/07/2009  . Syncope 03/07/2009  . URINARY RETENTION 12/17/2008  . HYPOTENSION 10/19/2008  .  CHEST PAIN, SUBSTERNAL 08/13/2008  . LEG PAIN 04/16/2008  . RENAL INSUFFICIENCY 03/30/2008  . CYSTITIS 01/23/2008  .  ABDOMINAL PAIN, RIGHT UPPER QUADRANT 10/03/2007  . HYPOKALEMIA 08/27/2007  . Chronic pain syndrome 08/08/2007  . TROCHANTERIC BURSITIS 08/08/2007  . SINUS BRADYCARDIA 03/31/2007  . ANXIETY 03/02/2007  . DEPRESSION 03/02/2007  . SYMPTOM, MEMORY LOSS 02/27/2007  . DEPENDENCE, DRUG NOS, CONTINUOUS 01/28/2007  . B12 DEFICIENCY 11/27/2006  . Unspecified vitamin D deficiency 11/27/2006  . HYPERLIPIDEMIA 11/27/2006  . MYOCARDIAL INFARCTION, HX OF 11/27/2006  . ALLERGIC RHINITIS 11/27/2006  . GERD 11/27/2006  . DIVERTICULOSIS, COLON 11/27/2006  . PRURITUS 11/27/2006  . URTICARIA 11/27/2006  . STRESS INCONTINENCE 11/27/2006  . ABDOMINAL PAIN, CHRONIC 11/27/2006  . COLONIC POLYPS, HX OF 11/27/2006  . BENIGN NEOPLASM OF COLON 08/26/2003  . HIATAL HERNIA 08/26/2003    ROS   Patient denies fever, chills, headache, sweats, rash, change in vision, change in hearing, cough, nausea vomiting, urinary symptoms. All other systems are reviewed and are negative.  PHYSICAL EXAM  Patient is oriented to person time and place. Affect is normal. There is no jugulovenous distention. Lungs are clear. Respiratory effort is nonlabored. Cardiac exam reveals S1 and S2. There no clicks or significant murmurs. Abdomen is soft. Is no peripheral edema.  Filed Vitals:   03/24/13 0847  BP: 102/64  Pulse: 75  Height: 5\' 5"  (1.651 m)  Weight: 151 lb (68.493 kg)   I reviewed an EKG in the computer from September, 2014. There was no significant change.  ASSESSMENT & PLAN

## 2013-03-24 NOTE — Assessment & Plan Note (Signed)
The patient has statin intolerance. No new medications will be given to her for her lipids.

## 2013-03-24 NOTE — Assessment & Plan Note (Signed)
The most recent carotid Doppler was September, 2014. There was some progression. She has bilateral 60-79% disease. She will be contacted for another followup in 6 months

## 2013-03-24 NOTE — Assessment & Plan Note (Signed)
Coronary disease is stable. We know from catheterization 2007 that her grafts were patent. She does not need any followup studies at this time.

## 2013-04-01 ENCOUNTER — Other Ambulatory Visit: Payer: Self-pay | Admitting: Internal Medicine

## 2013-04-01 NOTE — Telephone Encounter (Signed)
Please advise ok to Rf? Med has been d/c.

## 2013-04-07 ENCOUNTER — Ambulatory Visit (INDEPENDENT_AMBULATORY_CARE_PROVIDER_SITE_OTHER): Payer: Medicare Other | Admitting: Internal Medicine

## 2013-04-07 ENCOUNTER — Encounter: Payer: Self-pay | Admitting: Internal Medicine

## 2013-04-07 VITALS — BP 114/70 | HR 72 | Temp 98.2°F | Resp 16 | Wt 151.0 lb

## 2013-04-07 DIAGNOSIS — F429 Obsessive-compulsive disorder, unspecified: Secondary | ICD-10-CM

## 2013-04-07 DIAGNOSIS — F411 Generalized anxiety disorder: Secondary | ICD-10-CM

## 2013-04-07 DIAGNOSIS — J309 Allergic rhinitis, unspecified: Secondary | ICD-10-CM

## 2013-04-07 DIAGNOSIS — E538 Deficiency of other specified B group vitamins: Secondary | ICD-10-CM

## 2013-04-07 DIAGNOSIS — I959 Hypotension, unspecified: Secondary | ICD-10-CM

## 2013-04-07 DIAGNOSIS — F329 Major depressive disorder, single episode, unspecified: Secondary | ICD-10-CM

## 2013-04-07 DIAGNOSIS — G894 Chronic pain syndrome: Secondary | ICD-10-CM

## 2013-04-07 MED ORDER — TRIMETHOPRIM 100 MG PO TABS: 100.0000 mg | ORAL_TABLET | Freq: Every day | ORAL | Status: DC

## 2013-04-07 MED ORDER — METHADONE HCL 10 MG PO TABS: 10.0000 mg | ORAL_TABLET | Freq: Every day | ORAL | Status: DC | PRN

## 2013-04-07 NOTE — Assessment & Plan Note (Signed)
Continue with current prescription therapy as reflected on the Med list.  

## 2013-04-07 NOTE — Progress Notes (Signed)
Pre visit review using our clinic review tool, if applicable. No additional management support is needed unless otherwise documented below in the visit note. 

## 2013-04-16 ENCOUNTER — Other Ambulatory Visit: Payer: Self-pay | Admitting: Internal Medicine

## 2013-06-12 ENCOUNTER — Other Ambulatory Visit: Payer: Self-pay | Admitting: Internal Medicine

## 2013-06-16 ENCOUNTER — Telehealth: Payer: Self-pay | Admitting: *Deleted

## 2013-06-16 ENCOUNTER — Other Ambulatory Visit: Payer: Self-pay | Admitting: Internal Medicine

## 2013-06-16 MED ORDER — METHADONE HCL 10 MG PO TABS: 10.0000 mg | ORAL_TABLET | Freq: Every day | ORAL | Status: DC | PRN

## 2013-06-16 NOTE — Telephone Encounter (Signed)
Given to husband Thx

## 2013-06-16 NOTE — Telephone Encounter (Signed)
Pt req Rf on Methadone. Please advise.

## 2013-07-07 ENCOUNTER — Other Ambulatory Visit (INDEPENDENT_AMBULATORY_CARE_PROVIDER_SITE_OTHER): Payer: Medicare Other

## 2013-07-07 ENCOUNTER — Ambulatory Visit (INDEPENDENT_AMBULATORY_CARE_PROVIDER_SITE_OTHER): Payer: Medicare Other | Admitting: Internal Medicine

## 2013-07-07 ENCOUNTER — Encounter: Payer: Self-pay | Admitting: Internal Medicine

## 2013-07-07 VITALS — BP 110/72 | HR 98 | Temp 98.2°F | Wt 152.0 lb

## 2013-07-07 DIAGNOSIS — R55 Syncope and collapse: Secondary | ICD-10-CM

## 2013-07-07 DIAGNOSIS — F411 Generalized anxiety disorder: Secondary | ICD-10-CM

## 2013-07-07 DIAGNOSIS — E538 Deficiency of other specified B group vitamins: Secondary | ICD-10-CM

## 2013-07-07 DIAGNOSIS — R109 Unspecified abdominal pain: Secondary | ICD-10-CM

## 2013-07-07 DIAGNOSIS — N259 Disorder resulting from impaired renal tubular function, unspecified: Secondary | ICD-10-CM

## 2013-07-07 LAB — CBC WITH DIFFERENTIAL/PLATELET
BASOS ABS: 0 10*3/uL (ref 0.0–0.1)
BASOS PCT: 0.3 % (ref 0.0–3.0)
Eosinophils Absolute: 0.7 10*3/uL (ref 0.0–0.7)
Eosinophils Relative: 8.1 % — ABNORMAL HIGH (ref 0.0–5.0)
HCT: 32.9 % — ABNORMAL LOW (ref 36.0–46.0)
Hemoglobin: 10.8 g/dL — ABNORMAL LOW (ref 12.0–15.0)
Lymphocytes Relative: 18.5 % (ref 12.0–46.0)
Lymphs Abs: 1.7 10*3/uL (ref 0.7–4.0)
MCHC: 32.7 g/dL (ref 30.0–36.0)
MCV: 95.8 fl (ref 78.0–100.0)
MONOS PCT: 10.2 % (ref 3.0–12.0)
Monocytes Absolute: 0.9 10*3/uL (ref 0.1–1.0)
NEUTROS ABS: 5.8 10*3/uL (ref 1.4–7.7)
Neutrophils Relative %: 62.9 % (ref 43.0–77.0)
PLATELETS: 234 10*3/uL (ref 150.0–400.0)
RBC: 3.43 Mil/uL — ABNORMAL LOW (ref 3.87–5.11)
RDW: 14.4 % (ref 11.5–14.6)
WBC: 9.1 10*3/uL (ref 4.5–10.5)

## 2013-07-07 LAB — HEPATIC FUNCTION PANEL
ALT: 14 U/L (ref 0–35)
AST: 25 U/L (ref 0–37)
Albumin: 3.5 g/dL (ref 3.5–5.2)
Alkaline Phosphatase: 160 U/L — ABNORMAL HIGH (ref 39–117)
BILIRUBIN TOTAL: 0.7 mg/dL (ref 0.3–1.2)
Bilirubin, Direct: 0.1 mg/dL (ref 0.0–0.3)
Total Protein: 6.6 g/dL (ref 6.0–8.3)

## 2013-07-07 LAB — BASIC METABOLIC PANEL
BUN: 24 mg/dL — ABNORMAL HIGH (ref 6–23)
CHLORIDE: 100 meq/L (ref 96–112)
CO2: 27 meq/L (ref 19–32)
CREATININE: 1.6 mg/dL — AB (ref 0.4–1.2)
Calcium: 9.2 mg/dL (ref 8.4–10.5)
GFR: 31.77 mL/min — ABNORMAL LOW (ref >60.00)
GLUCOSE: 107 mg/dL — AB (ref 70–99)
Potassium: 4 mEq/L (ref 3.5–5.1)
Sodium: 134 mEq/L — ABNORMAL LOW (ref 135–145)

## 2013-07-07 LAB — TSH: TSH: 5.96 u[IU]/mL — ABNORMAL HIGH (ref 0.35–5.50)

## 2013-07-07 LAB — VITAMIN B12: Vitamin B-12: 222 pg/mL (ref 211–911)

## 2013-07-07 MED ORDER — DIPHENHYDRAMINE HCL 50 MG PO TABS: 25.0000 mg | ORAL_TABLET | Freq: Three times a day (TID) | ORAL | Status: DC | PRN

## 2013-07-07 MED ORDER — METHADONE HCL 10 MG PO TABS: 10.0000 mg | ORAL_TABLET | Freq: Every day | ORAL | Status: DC | PRN

## 2013-07-07 NOTE — Assessment & Plan Note (Signed)
Continue with current prescription therapy as reflected on the Med list.  

## 2013-07-07 NOTE — Assessment & Plan Note (Signed)
Labs

## 2013-07-07 NOTE — Progress Notes (Signed)
Subjective:    HPI  F/u episodic night time restlessness, confusion. No meds overuse lately. Agitated at night  The patient is here to follow up on chronic paranoid depression, anxiety,  and chronic moderate rectal pain symptoms controlled with medicines some. F/u chronic severe abd pain. F/u spasms in legs and arms at night; not sleeping  Wt Readings from Last 3 Encounters:  07/07/13 152 lb (68.947 kg)  04/07/13 151 lb (68.493 kg)  03/24/13 151 lb (68.493 kg)     BP Readings from Last 3 Encounters:  07/07/13 110/72  04/07/13 114/70  03/24/13 102/64     BP 110/72  Pulse 98  Temp(Src) 98.2 F (36.8 C) (Oral)  Wt 152 lb (68.947 kg)  SpO2 95%   Past Medical History  Diagnosis Date  . CAD (coronary artery disease)     Catheterization, January, 2007 patent grafts / hospitalization October 2 010 no MI  . Syncope 03/2009    ?orthostasis  . Allergic rhinitis   . History of colon polyps   . Diverticulosis of colon   . GERD (gastroesophageal reflux disease)   . Hyperlipidemia   . Abdominal  pain, other specified site     motility disorder, chorinic functional, severe (Dr Olevia Perches). Possible OCD w/complusive use of enemas and laxatives; psychotic features 2011  . Positive H. pylori test 2911  . Psychosomatic disorder     w/GI fixation  . Anxiety   . Depression   . Stress incontinence, female   . Vitamin D deficiency   . Vitamin B12 deficiency   . Renal insufficiency   . UTI (urinary tract infection)   . Hypokalemia     mild  . Insomnia   . Carotid artery disease     Doppler November, 1443, 1-54% LICA, 00-86% R. ICA.Marland Kitchen increased velocity... possibly from serpentine vessel  . Ejection fraction     50-55%, no wall motion abnormalities, echo, November, 2010  . Edema   . Hemothorax on right   . Breast cancer 2011    Dr Humphrey Rolls- right breast   Past Surgical History  Procedure Laterality Date  . Coronary artery bypass graft    . Myomectomy      Uterine and ovarian   . Breast fibroadenoma surgery    . Hemicolectomy  2005    Left  . Breast lumpectomy  2011  . Right vats (video-assisted thoracoscopic surgery) with      reports that she has never smoked. She does not have any smokeless tobacco history on file. She reports that she does not drink alcohol or use illicit drugs. family history includes Arthritis in her mother; Colon cancer in her mother; Coronary artery disease in an unspecified family member; and Heart disease in her father and mother. Allergies  Allergen Reactions  . Baclofen     REACTION: confusion  . Belladonna   . Ciprofloxacin   . Doxycycline   . Erythromycin   . Gabapentin   . Iohexol      Code: RASH   . Metoclopramide     Clonic spasms  . Metronidazole     REACTION: rash  . Nalbuphine   . Nitrofurantoin     REACTION: nausea  . Penicillins   . Procaine Hcl   . Sucralfate     REACTION: nausea  . Sulfonamide Derivatives   . Methenamine Rash   Current Outpatient Prescriptions on File Prior to Visit  Medication Sig Dispense Refill  . anastrozole (ARIMIDEX) 1 MG tablet Take 1 tablet (  1 mg total) by mouth daily.  90 tablet  3  . clotrimazole-betamethasone (LOTRISONE) cream Apply topically 2 (two) times daily.  90 g  3  . conjugated estrogens (PREMARIN) vaginal cream Place 0.5 Applicatorfuls vaginally 2 (two) times a week.  42.5 g  5  . [employer] ALLERGY 25 MG capsule TAKE ONE CAPSULE BY MOUTH EVERY 6 HOURS AS NEEDED ITCHING  60 capsule  1  . diphenhydrAMINE (BENADRYL) 25 mg capsule Take 1 capsule (25 mg total) by mouth every 6 (six) hours as needed for itching.  60 capsule  5  . divalproex (DEPAKOTE) 250 MG DR tablet Take 1 tablet (250 mg total) by mouth 2 (two) times daily.  60 tablet  11  . DULoxetine (CYMBALTA) 30 MG capsule Take 1 capsule (30 mg total) by mouth daily.  90 capsule  3  . indapamide (LOZOL) 2.5 MG tablet Take 1 tablet (2.5 mg total) by mouth every morning.  90 tablet  3  . KLOR-CON M10 10 MEQ tablet TAKE 1  TABLET BY MOUTH 2 TIMES A DAY  60 tablet  5  . Lactulose 20 GM/30ML SOLN Take 30-60 mLs by mouth as needed.        . loratadine (CLARITIN) 10 MG tablet Take 1 tablet (10 mg total) by mouth daily.  100 tablet  3  . LORazepam (ATIVAN) 1 MG tablet Take 1 tablet (1 mg total) by mouth every 8 (eight) hours as needed for anxiety.  270 tablet  1  . metoCLOPramide (REGLAN) 5 MG tablet TAKE 1 TABLET BY MOUTH 3 TIMES A DAY BEFORE MEALS  90 tablet  1  . metoprolol succinate (TOPROL-XL) 25 MG 24 hr tablet Take 1 tablet (25 mg total) by mouth daily.  90 tablet  3  . mometasone (ELOCON) 0.1 % ointment Apply topically 2 (two) times daily. Apply to vaginal area  45 g  1  . omeprazole (PRILOSEC) 20 MG capsule Take 1 capsule (20 mg total) by mouth 2 (two) times daily.  180 capsule  3  . Pancrelipase, Lip-Prot-Amyl, (CREON) 24000 UNITS CPEP Take 1 capsule (24,000 Units total) by mouth 3 (three) times daily with meals.  90 capsule  5  . promethazine (PHENERGAN) 25 MG tablet Take 1 tablet (25 mg total) by mouth every 8 (eight) hours as needed for nausea.  60 tablet  5  . Tamsulosin HCl (FLOMAX) 0.4 MG CAPS Take 1 capsule (0.4 mg total) by mouth daily.  90 capsule  3  . trimethoprim (TRIMPEX) 100 MG tablet Take 1 tablet (100 mg total) by mouth daily.  90 tablet  3  . Vitamin D, Ergocalciferol, (DRISDOL) 50000 UNITS CAPS Take 1 capsule (50,000 Units total) by mouth every 30 (thirty) days.  6 capsule  1  . zolpidem (AMBIEN) 10 MG tablet TAKE 1 TABLET AT BEDTIME AS NEEDED  90 tablet  1   No current facility-administered medications on file prior to visit.      Review of Systems  Constitutional: Positive for fatigue. Negative for activity change, appetite change and unexpected weight change.  HENT: Negative for congestion, mouth sores and sinus pressure.   Eyes: Negative for visual disturbance.  Respiratory: Negative for cough and chest tightness.   Gastrointestinal: Positive for constipation. Negative for abdominal  pain, diarrhea and blood in stool.  Genitourinary: Negative for difficulty urinating and vaginal pain.  Musculoskeletal: Negative for back pain and gait problem.  Skin: Negative for pallor and rash.  Neurological: Negative for dizziness, tremors, weakness,  numbness and headaches.  Psychiatric/Behavioral: Positive for behavioral problems, sleep disturbance, dysphoric mood and decreased concentration. Negative for suicidal ideas, hallucinations, confusion and agitation. The patient is nervous/anxious.        Objective:   Physical Exam  Constitutional: She appears well-developed. No distress.  Chron ill appearing  HENT:  Head: Normocephalic.  Right Ear: External ear normal.  Left Ear: External ear normal.  Nose: Nose normal.  Mouth/Throat: Oropharynx is clear and moist.  Eyes: Conjunctivae are normal. Pupils are equal, round, and reactive to light. Right eye exhibits no discharge. Left eye exhibits no discharge.  Neck: Normal range of motion. Neck supple. No JVD present. No tracheal deviation present. No thyromegaly present.  Cardiovascular: Normal rate, regular rhythm and normal heart sounds.   Pulmonary/Chest: No stridor. No respiratory distress. She has no wheezes.  Abdominal: Soft. Bowel sounds are normal. She exhibits no distension and no mass. There is tenderness (mild). There is no rebound and no guarding.  Musculoskeletal: She exhibits no edema and no tenderness.  Lymphadenopathy:    She has no cervical adenopathy.  Neurological: She displays normal reflexes. No cranial nerve deficit. She exhibits normal muscle tone. Coordination normal.  Skin: No rash noted. No erythema.  Psychiatric: Her behavior is normal. Thought content normal.  A/o/c depressed     Lab Results  Component Value Date   WBC 6.3 06/02/2012   HGB 11.0* 06/02/2012   HCT 33.1* 06/02/2012   PLT 202.0 06/02/2012   GLUCOSE 66* 06/02/2012   CHOL 302* 10/07/2009   TRIG 274.0* 10/07/2009   HDL 51.50 10/07/2009    LDLDIRECT 195.8 10/07/2009   LDLCALC  Value: 280        Total Cholesterol/HDL:CHD Risk Coronary Heart Disease Risk Table                     Men   Women  1/2 Average Risk   3.4   3.3  Average Risk       5.0   4.4  2 X Average Risk   9.6   7.1  3 X Average Risk  23.4   11.0        Use the calculated Patient Ratio above and the CHD Risk Table to determine the patient's CHD Risk.        ATP III CLASSIFICATION (LDL):  <100     mg/dL   Optimal  100-129  mg/dL   Near or Above                    Optimal  130-159  mg/dL   Borderline  160-189  mg/dL   High  >190     mg/dL   Very High* 02/10/2009   ALT 17 06/02/2012   AST 24 06/02/2012   NA 135 06/02/2012   K 4.4 06/02/2012   CL 101 06/02/2012   CREATININE 1.3* 06/02/2012   BUN 26* 06/02/2012   CO2 30 06/02/2012   TSH 3.09 02/29/2012   INR 1.02 12/25/2010       It is a complex case with a lot of self-overmedicating for abdominal pain and "bowel fixation" issues     Assessment & Plan:

## 2013-07-07 NOTE — Assessment & Plan Note (Signed)
Cont to monitor

## 2013-07-07 NOTE — Progress Notes (Signed)
Pre visit review using our clinic review tool, if applicable. No additional management support is needed unless otherwise documented below in the visit note. 

## 2013-07-24 ENCOUNTER — Other Ambulatory Visit: Payer: Self-pay | Admitting: Internal Medicine

## 2013-07-27 ENCOUNTER — Other Ambulatory Visit: Payer: Self-pay | Admitting: Internal Medicine

## 2013-07-27 DIAGNOSIS — Z9889 Other specified postprocedural states: Secondary | ICD-10-CM

## 2013-07-27 DIAGNOSIS — Z853 Personal history of malignant neoplasm of breast: Secondary | ICD-10-CM

## 2013-08-04 ENCOUNTER — Other Ambulatory Visit: Payer: Self-pay | Admitting: Internal Medicine

## 2013-08-14 ENCOUNTER — Telehealth: Payer: Self-pay | Admitting: Internal Medicine

## 2013-08-14 MED ORDER — DIVALPROEX SODIUM 500 MG PO DR TAB: 500.0000 mg | DELAYED_RELEASE_TABLET | Freq: Three times a day (TID) | ORAL | Status: AC

## 2013-08-14 NOTE — Telephone Encounter (Signed)
Legs twitcing Can't sleep  We will increase Depakote to 500 po tid

## 2013-08-28 ENCOUNTER — Telehealth: Payer: Self-pay | Admitting: Oncology

## 2013-08-28 NOTE — Telephone Encounter (Signed)
, °

## 2013-09-03 ENCOUNTER — Ambulatory Visit: Payer: Medicare Other | Admitting: Oncology

## 2013-09-03 ENCOUNTER — Other Ambulatory Visit: Payer: Medicare Other

## 2013-09-10 ENCOUNTER — Other Ambulatory Visit: Payer: Self-pay | Admitting: *Deleted

## 2013-09-10 DIAGNOSIS — D509 Iron deficiency anemia, unspecified: Secondary | ICD-10-CM

## 2013-09-11 ENCOUNTER — Other Ambulatory Visit: Payer: Self-pay | Admitting: *Deleted

## 2013-09-11 ENCOUNTER — Other Ambulatory Visit: Payer: Medicare Other

## 2013-09-11 ENCOUNTER — Ambulatory Visit
Admission: RE | Admit: 2013-09-11 | Discharge: 2013-09-11 | Disposition: A | Discharge status: Home / Self Care | Payer: Medicare Other | Source: Ambulatory Visit | Attending: Internal Medicine | Admitting: Internal Medicine

## 2013-09-11 ENCOUNTER — Ambulatory Visit (HOSPITAL_BASED_OUTPATIENT_CLINIC_OR_DEPARTMENT_OTHER): Payer: Medicare Other | Admitting: Hematology and Oncology

## 2013-09-11 ENCOUNTER — Ambulatory Visit: Payer: Medicare Other

## 2013-09-11 VITALS — BP 172/80 | HR 73 | Temp 98.3°F | Resp 18 | Ht 65.0 in | Wt 160.9 lb

## 2013-09-11 DIAGNOSIS — M949 Disorder of cartilage, unspecified: Secondary | ICD-10-CM

## 2013-09-11 DIAGNOSIS — E539 Vitamin B deficiency, unspecified: Secondary | ICD-10-CM

## 2013-09-11 DIAGNOSIS — Z853 Personal history of malignant neoplasm of breast: Secondary | ICD-10-CM

## 2013-09-11 DIAGNOSIS — D649 Anemia, unspecified: Secondary | ICD-10-CM

## 2013-09-11 DIAGNOSIS — M899 Disorder of bone, unspecified: Secondary | ICD-10-CM

## 2013-09-11 DIAGNOSIS — M8580 Other specified disorders of bone density and structure, unspecified site: Secondary | ICD-10-CM | POA: Insufficient documentation

## 2013-09-11 DIAGNOSIS — C50219 Malignant neoplasm of upper-inner quadrant of unspecified female breast: Secondary | ICD-10-CM

## 2013-09-11 DIAGNOSIS — Z9889 Other specified postprocedural states: Secondary | ICD-10-CM

## 2013-09-11 DIAGNOSIS — R209 Unspecified disturbances of skin sensation: Secondary | ICD-10-CM

## 2013-09-11 MED ORDER — CYANOCOBALAMIN 1000 MCG/ML IJ SOLN: INTRAMUSCULAR | Status: DC

## 2013-09-11 NOTE — Progress Notes (Signed)
OFFICE PROGRESS NOTE  CC  Sandy Kehr, MD [address] [facility] [address] [place] [address] Dr. Rolm Bookbinder  Chief complaint: Followup visit for breast cancer  DIAGNOSIS: Invasive ductal carcinoma of the left breast -stage I  PRIOR THERAPY: As per previously documented note:   #1 status post lumpectomy on 04/26/2010 for a 1.0 cm invasive ductal carcinoma that was ER +100% PR +95% proliferation marker 14% and HER-2/neu negative.  #2 patient was then begun on Arimidex 1 mg daily starting in January 2012.  CURRENT THERAPY:Arimidex 1 mg daily  INTERVAL HISTORY: [name] 80 y.o. female returns for followup visit for her left breast cancer. She's been tolerating Arimidex quite well without any hot flashes or joint pains. She continued to do exercises and follows healthy diet. She had  her blood workup done in March 2015 and it shows B12 level of 222. She complains of fatigue other than that Denis any shortness of breath, chest pain, palpitations, blood in the stool or blood in the urine. Denies any headaches dizziness blurred vision and she is maintaining her weight no decrease in appetite. She also complains of occasional tingling and numbness in the extremities Her last DEXA scan was done in February 2012 and it shows low bone mass and she is only on vitamin D supplementation at present. Her recent mammogram was done in May 2015 and it shows no evidence of any malignancy. She says her last Pap smear was done approximately 2 years ago   MEDICAL HISTORY: Past Medical History  Diagnosis Date  . CAD (coronary artery disease)     Catheterization, January, 2007 patent grafts / hospitalization October 2 010 no MI  . Syncope 03/2009    ?orthostasis  . Allergic rhinitis   . History of colon polyps   . Diverticulosis of colon   . GERD (gastroesophageal reflux disease)   . Hyperlipidemia   . Abdominal pain, other specified site     motility disorder,  chorinic functional, severe (Dr Olevia Perches). Possible OCD w/complusive use of enemas and laxatives; psychotic features 2011  . Positive H. pylori test 2911  . Psychosomatic disorder     w/GI fixation  . Anxiety   . Depression   . Stress incontinence, female   . Vitamin D deficiency   . Vitamin B12 deficiency   . Renal insufficiency   . UTI (urinary tract infection)   . Hypokalemia     mild  . Insomnia   . Carotid artery disease     Doppler November, 6045, 4-09% LICA, 81-19% R. ICA.Marland Kitchen increased velocity... possibly from serpentine vessel  . Ejection fraction     50-55%, no wall motion abnormalities, echo, November, 2010  . Edema   . Hemothorax on right   . Breast cancer 2011    Dr Humphrey Rolls- right breast    ALLERGIES:  is allergic to baclofen; belladonna; ciprofloxacin; doxycycline; erythromycin; gabapentin; iohexol; metoclopramide; metronidazole; nalbuphine; nitrofurantoin; penicillins; procaine hcl; sucralfate; sulfonamide derivatives; and methenamine.  MEDICATIONS:  Current Outpatient Prescriptions  Medication Sig Dispense Refill  . anastrozole (ARIMIDEX) 1 MG tablet TAKE 1 TABLET BY MOUTH EVERY DAY  90 tablet  3  . conjugated estrogens (PREMARIN) vaginal cream Place 0.5 Applicatorfuls vaginally 2 (two) times a week.  42.5 g  5  . [employer] ALLERGY 25 MG tablet TAKE ONE CAPSULE BY MOUTH EVERY 6 HOURS AS NEEDED  60 tablet  5  . diphenhydrAMINE (BENADRYL) 50 MG tablet Take 0.5-1 tablets (25-50 mg  total) by mouth every 8 (eight) hours as needed for itching or allergies.  100 tablet  3  . divalproex (DEPAKOTE) 500 MG DR tablet Take 1 tablet (500 mg total) by mouth 3 (three) times daily.  90 tablet  3  . DULoxetine (CYMBALTA) 30 MG capsule Take 1 capsule (30 mg total) by mouth daily.  90 capsule  3  . FLUoxetine (PROZAC) 10 MG capsule TAKE 1 CAPSULE BY MOUTH DAILY  30 capsule  11  . indapamide (LOZOL) 2.5 MG tablet Take 1 tablet (2.5 mg total) by mouth daily as needed. Only takes if blood pressure  is high  30 tablet  11  . LORazepam (ATIVAN) 1 MG tablet TAKE 1 TABLET BY MOUTH EVERY 8 HOURS AS NEEDED FOR ANXIETY  270 tablet  1  . methadone (DOLOPHINE) 10 MG tablet Take 1 tablet (10 mg total) by mouth 5 (five) times daily as needed for severe pain. Fill on or after 09/28/13. Once every 30 days  150 tablet  0  . metoprolol succinate (TOPROL-XL) 25 MG 24 hr tablet Take 1 tablet (25 mg total) by mouth daily.  90 tablet  3  . omeprazole (PRILOSEC) 20 MG capsule Take 1 capsule (20 mg total) by mouth 2 (two) times daily.  180 capsule  3  . potassium chloride (KLOR-CON M10) 10 MEQ tablet Take 1 tablet (10 mEq total) by mouth 2 (two) times daily.  60 tablet  5  . promethazine (PHENERGAN) 25 MG tablet Take 1 tablet (25 mg total) by mouth every 8 (eight) hours as needed for nausea.  60 tablet  5  . tamsulosin (FLOMAX) 0.4 MG CAPS Take 1 capsule (0.4 mg total) by mouth daily.  90 capsule  3  . trimethoprim (TRIMPEX) 100 MG tablet Take 1 tablet (100 mg total) by mouth daily.  90 tablet  3  . Vitamin D, Ergocalciferol, (DRISDOL) 50000 UNITS CAPS Take 1 capsule (50,000 Units total) by mouth every 30 (thirty) days.  6 capsule  1  . zolpidem (AMBIEN) 10 MG tablet Take 1 tablet (10 mg total) by mouth at bedtime as needed for sleep.  90 tablet  1   No current facility-administered medications for this visit.    SURGICAL HISTORY:  Past Surgical History  Procedure Laterality Date  . Coronary artery bypass graft    . Myomectomy      Uterine and ovarian  . Breast fibroadenoma surgery    . Hemicolectomy  2005    Left  . Breast lumpectomy  2011  . Right vats (video-assisted thoracoscopic surgery) with      REVIEW OF SYSTEMS: A 10 point review of symptoms were assessed and the pertinent symptoms were mentioned in interval history   PHYSICAL EXAMINATION: General appearance: alert, cooperative and appears stated age Lymph nodes: Cervical, supraclavicular, and axillary nodes normal. Resp: clear to  auscultation bilaterally and normal percussion bilaterally Back: symmetric, no curvature. ROM normal. No CVA tenderness. Cardio: regular rate and rhythm, S1, S2 normal, no murmur, click, rub or gallop GI: soft, non-tender; bowel sounds normal; no masses,  no organomegaly Extremities: extremities normal, atraumatic, no cyanosis. Patient does have mild bilateral lower extremity edema Neurologic: Alert and oriented X 3, normal strength and tone. Normal symmetric reflexes. Normal coordination and gait Bilateral breast examination: Bilateral breast exam is performed there are no masses nipple discharge no axillary lymphadenopathy is noted in either of the breasts. ECOG PERFORMANCE STATUS: 1 - Symptomatic but completely ambulatory  Blood pressure 172/80, pulse  73, temperature 98.3 F (36.8 C), temperature source Oral, resp. rate 18, height $RemoveBe'5\' 5"'qUwloPGdE$  (1.651 m), weight 160 lb 14.4 oz (72.984 kg).  LABORATORY DATA: Lab Results  Component Value Date   WBC 9.1 07/07/2013   HGB 10.8* 07/07/2013   HCT 32.9* 07/07/2013   MCV 95.8 07/07/2013   PLT 234.0 07/07/2013      Chemistry      Component Value Date/Time   NA 134* 07/07/2013 1017   NA 140 09/01/2012 1112   K 4.0 07/07/2013 1017   K 4.2 09/01/2012 1112   CL 100 07/07/2013 1017   CL 106 09/01/2012 1112   CO2 27 07/07/2013 1017   CO2 26 09/01/2012 1112   BUN 24* 07/07/2013 1017   BUN 22.1 09/01/2012 1112   CREATININE 1.6* 07/07/2013 1017   CREATININE 1.4* 09/01/2012 1112      Component Value Date/Time   CALCIUM 9.2 07/07/2013 1017   CALCIUM 8.9 09/01/2012 1112   ALKPHOS 160* 07/07/2013 1017   ALKPHOS 160* 09/01/2012 1112   AST 25 07/07/2013 1017   AST 17 09/01/2012 1112   ALT 14 07/07/2013 1017   ALT 12 09/01/2012 1112   BILITOT 0.7 07/07/2013 1017   BILITOT 0.34 09/01/2012 1112       ASSESSMENT/PLAN: 80 year old female with  #1 stage I invasive ductal carcinoma of the left breast status post lumpectomy in December 2011. This was then followed by Arimidex 1 mg daily  starting in January 2012. She is tolerating Arimidex very well without any symptoms. Continue Arimidex 1 mg by mouth once daily until January 2017. I have also reviewed her recent laboratories and mammogram report which was done in May 2015. There is no clinical /radiological evidence of any cancer disease recurrence at this point.  #2 osteopenia: She is only taking vitamin D and asked the patient to start taking calcium 1200 mg by mouth once daily in conjunction with vitamin D. Since her last DEXA scan was in 2012 we'll arrange for repeat bone densitometry to further assess bone density while she is on Arimidex therapy.  #3 borderline low B12 levels with anemia and tingling or numbness: We'll arrange for  B12 injection thousand micrograms IM once weekly x4 doses. I have asked the patient to follow up with primary care physician for further management   #4 next bilateral diagnostic mammogram in may 2015   Next followup visit in may 2016 with CBC differential, CMP on the day of next visit   All questions were answered. The patient knows to call the clinic with any problems, questions or concerns. We can certainly see the patient much sooner if necessary.  I spent 20 minutes counseling the patient face to face. The total time spent in the appointment was 30 minutes.  Wilmon Arms, MD Medical/Oncology [facility] [phone] (Office)  09/11/2013, 2:47 PM

## 2013-09-16 ENCOUNTER — Telehealth: Payer: Self-pay | Admitting: Oncology

## 2013-09-16 NOTE — Telephone Encounter (Signed)
s/w pt/husband via [employer] [phone]). pt given appt for bone density test 10/01/13 and lb/fu (gudena) 09/15/14. KK pt scheduled w/Dr. Lindi Adie for may 2016.

## 2013-10-01 ENCOUNTER — Other Ambulatory Visit: Payer: Medicare Other

## 2013-10-07 ENCOUNTER — Ambulatory Visit (INDEPENDENT_AMBULATORY_CARE_PROVIDER_SITE_OTHER): Payer: Medicare Other | Admitting: Internal Medicine

## 2013-10-07 ENCOUNTER — Encounter: Payer: Self-pay | Admitting: Internal Medicine

## 2013-10-07 VITALS — BP 138/98 | HR 81 | Temp 98.0°F | Ht 61.5 in | Wt 156.0 lb

## 2013-10-07 DIAGNOSIS — F1920 Other psychoactive substance dependence, uncomplicated: Secondary | ICD-10-CM

## 2013-10-07 DIAGNOSIS — E538 Deficiency of other specified B group vitamins: Secondary | ICD-10-CM

## 2013-10-07 DIAGNOSIS — F29 Unspecified psychosis not due to a substance or known physiological condition: Secondary | ICD-10-CM

## 2013-10-07 DIAGNOSIS — R413 Other amnesia: Secondary | ICD-10-CM

## 2013-10-07 DIAGNOSIS — G4700 Insomnia, unspecified: Secondary | ICD-10-CM

## 2013-10-07 DIAGNOSIS — I2510 Atherosclerotic heart disease of native coronary artery without angina pectoris: Secondary | ICD-10-CM

## 2013-10-07 DIAGNOSIS — F429 Obsessive-compulsive disorder, unspecified: Secondary | ICD-10-CM

## 2013-10-07 MED ORDER — RISPERIDONE 1 MG PO TABS: 1.0000 mg | ORAL_TABLET | Freq: Two times a day (BID) | ORAL | Status: DC

## 2013-10-07 MED ORDER — METHADONE HCL 10 MG PO TABS: 10.0000 mg | ORAL_TABLET | Freq: Every day | ORAL | Status: DC | PRN

## 2013-10-07 MED ORDER — LORAZEPAM 1 MG PO TABS: ORAL_TABLET | ORAL | Status: DC

## 2013-10-07 NOTE — Progress Notes (Signed)
Pre visit review using our clinic review tool, if applicable. No additional management support is needed unless otherwise documented below in the visit note. 

## 2013-10-07 NOTE — Progress Notes (Signed)
Subjective:    C/o insomnia, agitation at night - not sleeping - worse  F/u episodic night time restlessness, confusion. No meds overuse lately. Agitated at night  The patient is here to follow up on chronic paranoid depression - worse, anxiety - more restless,  and chronic moderate rectal pain symptoms controlled with medicines some. F/u chronic severe abd pain. F/u spasms in legs and arms at night; not sleeping  Wt Readings from Last 3 Encounters:  10/07/13 156 lb (70.761 kg)  09/11/13 160 lb 14.4 oz (72.984 kg)  07/07/13 152 lb (68.947 kg)     BP Readings from Last 3 Encounters:  10/07/13 138/98  09/11/13 172/80  07/07/13 110/72     BP 138/98  Pulse 81  Temp(Src) 98 F (36.7 C) (Oral)  Ht 5' 1.5" (1.562 m)  Wt 156 lb (70.761 kg)  BMI 29.00 kg/m2  SpO2 94%   Past Medical History  Diagnosis Date  . CAD (coronary artery disease)     Catheterization, January, 2007 patent grafts / hospitalization October 2 010 no MI  . Syncope 03/2009    ?orthostasis  . Allergic rhinitis   . History of colon polyps   . Diverticulosis of colon   . GERD (gastroesophageal reflux disease)   . Hyperlipidemia   . Abdominal  pain, other specified site     motility disorder, chorinic functional, severe (Dr Olevia Perches). Possible OCD w/complusive use of enemas and laxatives; psychotic features 2011  . Positive H. pylori test 2911  . Psychosomatic disorder     w/GI fixation  . Anxiety   . Depression   . Stress incontinence, female   . Vitamin D deficiency   . Vitamin B12 deficiency   . Renal insufficiency   . UTI (urinary tract infection)   . Hypokalemia     mild  . Insomnia   . Carotid artery disease     Doppler November, 4193, 7-90% LICA, 24-09% R. ICA.Marland Kitchen increased velocity... possibly from serpentine vessel  . Ejection fraction     50-55%, no wall motion abnormalities, echo, November, 2010  . Edema   . Hemothorax on right   . Breast cancer 2011    Dr Humphrey Rolls- right breast   Past  Surgical History  Procedure Laterality Date  . Coronary artery bypass graft    . Myomectomy      Uterine and ovarian  . Breast fibroadenoma surgery    . Hemicolectomy  2005    Left  . Breast lumpectomy  2011  . Right vats (video-assisted thoracoscopic surgery) with      reports that she has never smoked. She does not have any smokeless tobacco history on file. She reports that she does not drink alcohol or use illicit drugs. family history includes Arthritis in her mother; Colon cancer in her mother; Coronary artery disease in an unspecified family member; and Heart disease in her father and mother. Allergies  Allergen Reactions  . Baclofen     REACTION: confusion  . Belladonna   . Ciprofloxacin   . Doxycycline   . Erythromycin   . Gabapentin   . Iohexol      Code: RASH   . Metoclopramide     Clonic spasms  . Metronidazole     REACTION: rash  . Nalbuphine   . Nitrofurantoin     REACTION: nausea  . Penicillins   . Procaine Hcl   . Sucralfate     REACTION: nausea  . Sulfonamide Derivatives   . Methenamine  Rash   Current Outpatient Prescriptions on File Prior to Visit  Medication Sig Dispense Refill  . anastrozole (ARIMIDEX) 1 MG tablet Take 1 tablet (1 mg total) by mouth daily.  90 tablet  3  . clotrimazole-betamethasone (LOTRISONE) cream Apply topically 2 (two) times daily.  90 g  3  . conjugated estrogens (PREMARIN) vaginal cream Place 0.5 Applicatorfuls vaginally 2 (two) times a week.  42.5 g  5  . [employer] ALLERGY 25 MG capsule TAKE ONE CAPSULE BY MOUTH EVERY 6 HOURS AS NEEDED ITCHING  60 capsule  1  . diphenhydrAMINE (BENADRYL) 25 mg capsule Take 1 capsule (25 mg total) by mouth every 6 (six) hours as needed for itching.  60 capsule  5  . divalproex (DEPAKOTE) 250 MG DR tablet Take 1 tablet (250 mg total) by mouth 2 (two) times daily.  60 tablet  11  . DULoxetine (CYMBALTA) 30 MG capsule Take 1 capsule (30 mg total) by mouth daily.  90 capsule  3  . indapamide (LOZOL)  2.5 MG tablet Take 1 tablet (2.5 mg total) by mouth every morning.  90 tablet  3  . KLOR-CON M10 10 MEQ tablet TAKE 1 TABLET BY MOUTH 2 TIMES A DAY  60 tablet  5  . Lactulose 20 GM/30ML SOLN Take 30-60 mLs by mouth as needed.        . loratadine (CLARITIN) 10 MG tablet Take 1 tablet (10 mg total) by mouth daily.  100 tablet  3  . LORazepam (ATIVAN) 1 MG tablet Take 1 tablet (1 mg total) by mouth every 8 (eight) hours as needed for anxiety.  270 tablet  1  . metoCLOPramide (REGLAN) 5 MG tablet TAKE 1 TABLET BY MOUTH 3 TIMES A DAY BEFORE MEALS  90 tablet  1  . metoprolol succinate (TOPROL-XL) 25 MG 24 hr tablet Take 1 tablet (25 mg total) by mouth daily.  90 tablet  3  . mometasone (ELOCON) 0.1 % ointment Apply topically 2 (two) times daily. Apply to vaginal area  45 g  1  . omeprazole (PRILOSEC) 20 MG capsule Take 1 capsule (20 mg total) by mouth 2 (two) times daily.  180 capsule  3  . Pancrelipase, Lip-Prot-Amyl, (CREON) 24000 UNITS CPEP Take 1 capsule (24,000 Units total) by mouth 3 (three) times daily with meals.  90 capsule  5  . promethazine (PHENERGAN) 25 MG tablet Take 1 tablet (25 mg total) by mouth every 8 (eight) hours as needed for nausea.  60 tablet  5  . Tamsulosin HCl (FLOMAX) 0.4 MG CAPS Take 1 capsule (0.4 mg total) by mouth daily.  90 capsule  3  . trimethoprim (TRIMPEX) 100 MG tablet Take 1 tablet (100 mg total) by mouth daily.  90 tablet  3  . Vitamin D, Ergocalciferol, (DRISDOL) 50000 UNITS CAPS Take 1 capsule (50,000 Units total) by mouth every 30 (thirty) days.  6 capsule  1  . zolpidem (AMBIEN) 10 MG tablet TAKE 1 TABLET AT BEDTIME AS NEEDED  90 tablet  1   No current facility-administered medications on file prior to visit.      Review of Systems  Constitutional: Positive for fatigue. Negative for activity change, appetite change and unexpected weight change.  HENT: Negative for congestion, mouth sores and sinus pressure.   Eyes: Negative for visual disturbance.   Respiratory: Negative for cough and chest tightness.   Gastrointestinal: Positive for constipation. Negative for abdominal pain, diarrhea and blood in stool.  Genitourinary: Negative for difficulty urinating  and vaginal pain.  Musculoskeletal: Negative for back pain and gait problem.  Skin: Negative for pallor and rash.  Neurological: Negative for dizziness, tremors, weakness, numbness and headaches.  Psychiatric/Behavioral: Positive for behavioral problems, sleep disturbance, dysphoric mood and decreased concentration. Negative for suicidal ideas, hallucinations, confusion and agitation. The patient is nervous/anxious.        Objective:   Physical Exam  Constitutional: She appears well-developed. No distress.  Chron ill appearing  HENT:  Head: Normocephalic.  Right Ear: External ear normal.  Left Ear: External ear normal.  Nose: Nose normal.  Mouth/Throat: Oropharynx is clear and moist.  Eyes: Conjunctivae are normal. Pupils are equal, round, and reactive to light. Right eye exhibits no discharge. Left eye exhibits no discharge.  Neck: Normal range of motion. Neck supple. No JVD present. No tracheal deviation present. No thyromegaly present.  Cardiovascular: Normal rate, regular rhythm and normal heart sounds.   Pulmonary/Chest: No stridor. No respiratory distress. She has no wheezes.  Abdominal: Soft. Bowel sounds are normal. She exhibits no distension and no mass. There is tenderness (mild). There is no rebound and no guarding.  Musculoskeletal: She exhibits no edema and no tenderness.  Lymphadenopathy:    She has no cervical adenopathy.  Neurological: She displays normal reflexes. No cranial nerve deficit. She exhibits normal muscle tone. Coordination normal.  Skin: No rash noted. No erythema.  Psychiatric: Her behavior is normal. Thought content normal.  A/o/c depressed     Lab Results  Component Value Date   WBC 6.3 06/02/2012   HGB 11.0* 06/02/2012   HCT 33.1* 06/02/2012    PLT 202.0 06/02/2012   GLUCOSE 66* 06/02/2012   CHOL 302* 10/07/2009   TRIG 274.0* 10/07/2009   HDL 51.50 10/07/2009   LDLDIRECT 195.8 10/07/2009   LDLCALC  Value: 280        Total Cholesterol/HDL:CHD Risk Coronary Heart Disease Risk Table                     Men   Women  1/2 Average Risk   3.4   3.3  Average Risk       5.0   4.4  2 X Average Risk   9.6   7.1  3 X Average Risk  23.4   11.0        Use the calculated Patient Ratio above and the CHD Risk Table to determine the patient's CHD Risk.        ATP III CLASSIFICATION (LDL):  <100     mg/dL   Optimal  100-129  mg/dL   Near or Above                    Optimal  130-159  mg/dL   Borderline  160-189  mg/dL   High  >190     mg/dL   Very High* 02/10/2009   ALT 17 06/02/2012   AST 24 06/02/2012   NA 135 06/02/2012   K 4.4 06/02/2012   CL 101 06/02/2012   CREATININE 1.3* 06/02/2012   BUN 26* 06/02/2012   CO2 30 06/02/2012   TSH 3.09 02/29/2012   INR 1.02 12/25/2010       It is a complex case with a lot of self-overmedicating for abdominal pain and "bowel fixation" issues     Assessment & Plan:

## 2013-10-07 NOTE — Assessment & Plan Note (Signed)
6/15 worse - manic phase

## 2013-10-07 NOTE — Assessment & Plan Note (Signed)
Discussed.

## 2013-10-07 NOTE — Assessment & Plan Note (Addendum)
5/15 worse

## 2013-10-07 NOTE — Patient Instructions (Signed)
Can increase Risperdal up to 2 mg twice a day

## 2013-10-07 NOTE — Assessment & Plan Note (Signed)
Continue with current prescription therapy as reflected on the Med list.  

## 2013-10-07 NOTE — Assessment & Plan Note (Signed)
Chronic. 

## 2013-10-07 NOTE — Assessment & Plan Note (Signed)
Will increase Risperdone. Worse

## 2013-10-08 ENCOUNTER — Other Ambulatory Visit: Payer: Medicare Other

## 2013-10-08 ENCOUNTER — Encounter (HOSPITAL_COMMUNITY): Payer: Self-pay | Admitting: Emergency Medicine

## 2013-10-08 ENCOUNTER — Emergency Department (HOSPITAL_COMMUNITY): Payer: Medicare Other

## 2013-10-08 ENCOUNTER — Inpatient Hospital Stay (HOSPITAL_COMMUNITY)
Admission: EM | Admit: 2013-10-08 | Discharge: 2013-10-10 | DRG: 689 | Disposition: A | Discharge status: Skilled Nursing Facility | Payer: Medicare Other | Attending: Internal Medicine | Admitting: Internal Medicine

## 2013-10-08 ENCOUNTER — Encounter: Payer: Self-pay | Admitting: Internal Medicine

## 2013-10-08 DIAGNOSIS — Z951 Presence of aortocoronary bypass graft: Secondary | ICD-10-CM

## 2013-10-08 DIAGNOSIS — K449 Diaphragmatic hernia without obstruction or gangrene: Secondary | ICD-10-CM

## 2013-10-08 DIAGNOSIS — D509 Iron deficiency anemia, unspecified: Secondary | ICD-10-CM

## 2013-10-08 DIAGNOSIS — F29 Unspecified psychosis not due to a substance or known physiological condition: Secondary | ICD-10-CM

## 2013-10-08 DIAGNOSIS — R413 Other amnesia: Secondary | ICD-10-CM

## 2013-10-08 DIAGNOSIS — D649 Anemia, unspecified: Secondary | ICD-10-CM | POA: Diagnosis present

## 2013-10-08 DIAGNOSIS — J309 Allergic rhinitis, unspecified: Secondary | ICD-10-CM

## 2013-10-08 DIAGNOSIS — R109 Unspecified abdominal pain: Secondary | ICD-10-CM

## 2013-10-08 DIAGNOSIS — B079 Viral wart, unspecified: Secondary | ICD-10-CM

## 2013-10-08 DIAGNOSIS — K5730 Diverticulosis of large intestine without perforation or abscess without bleeding: Secondary | ICD-10-CM

## 2013-10-08 DIAGNOSIS — N259 Disorder resulting from impaired renal tubular function, unspecified: Secondary | ICD-10-CM

## 2013-10-08 DIAGNOSIS — N3090 Cystitis, unspecified without hematuria: Secondary | ICD-10-CM

## 2013-10-08 DIAGNOSIS — A048 Other specified bacterial intestinal infections: Secondary | ICD-10-CM

## 2013-10-08 DIAGNOSIS — I959 Hypotension, unspecified: Secondary | ICD-10-CM

## 2013-10-08 DIAGNOSIS — L509 Urticaria, unspecified: Secondary | ICD-10-CM

## 2013-10-08 DIAGNOSIS — R55 Syncope and collapse: Secondary | ICD-10-CM

## 2013-10-08 DIAGNOSIS — Z79899 Other long term (current) drug therapy: Secondary | ICD-10-CM

## 2013-10-08 DIAGNOSIS — I4729 Other ventricular tachycardia: Secondary | ICD-10-CM

## 2013-10-08 DIAGNOSIS — E538 Deficiency of other specified B group vitamins: Secondary | ICD-10-CM

## 2013-10-08 DIAGNOSIS — K219 Gastro-esophageal reflux disease without esophagitis: Secondary | ICD-10-CM

## 2013-10-08 DIAGNOSIS — N39498 Other specified urinary incontinence: Secondary | ICD-10-CM

## 2013-10-08 DIAGNOSIS — F429 Obsessive-compulsive disorder, unspecified: Secondary | ICD-10-CM

## 2013-10-08 DIAGNOSIS — S0083XA Contusion of other part of head, initial encounter: Secondary | ICD-10-CM

## 2013-10-08 DIAGNOSIS — L299 Pruritus, unspecified: Secondary | ICD-10-CM

## 2013-10-08 DIAGNOSIS — L570 Actinic keratosis: Secondary | ICD-10-CM

## 2013-10-08 DIAGNOSIS — F919 Conduct disorder, unspecified: Secondary | ICD-10-CM | POA: Diagnosis present

## 2013-10-08 DIAGNOSIS — N390 Urinary tract infection, site not specified: Principal | ICD-10-CM

## 2013-10-08 DIAGNOSIS — M79609 Pain in unspecified limb: Secondary | ICD-10-CM

## 2013-10-08 DIAGNOSIS — Z860100 Personal history of colon polyps, unspecified: Secondary | ICD-10-CM

## 2013-10-08 DIAGNOSIS — C50919 Malignant neoplasm of unspecified site of unspecified female breast: Secondary | ICD-10-CM | POA: Diagnosis present

## 2013-10-08 DIAGNOSIS — I472 Ventricular tachycardia, unspecified: Secondary | ICD-10-CM

## 2013-10-08 DIAGNOSIS — E876 Hypokalemia: Secondary | ICD-10-CM

## 2013-10-08 DIAGNOSIS — G4700 Insomnia, unspecified: Secondary | ICD-10-CM

## 2013-10-08 DIAGNOSIS — R1310 Dysphagia, unspecified: Secondary | ICD-10-CM

## 2013-10-08 DIAGNOSIS — J690 Pneumonitis due to inhalation of food and vomit: Secondary | ICD-10-CM

## 2013-10-08 DIAGNOSIS — E559 Vitamin D deficiency, unspecified: Secondary | ICD-10-CM

## 2013-10-08 DIAGNOSIS — I779 Disorder of arteries and arterioles, unspecified: Secondary | ICD-10-CM

## 2013-10-08 DIAGNOSIS — F329 Major depressive disorder, single episode, unspecified: Secondary | ICD-10-CM

## 2013-10-08 DIAGNOSIS — Z853 Personal history of malignant neoplasm of breast: Secondary | ICD-10-CM

## 2013-10-08 DIAGNOSIS — R252 Cramp and spasm: Secondary | ICD-10-CM

## 2013-10-08 DIAGNOSIS — F411 Generalized anxiety disorder: Secondary | ICD-10-CM

## 2013-10-08 DIAGNOSIS — R609 Edema, unspecified: Secondary | ICD-10-CM

## 2013-10-08 DIAGNOSIS — G8929 Other chronic pain: Secondary | ICD-10-CM | POA: Diagnosis present

## 2013-10-08 DIAGNOSIS — F3289 Other specified depressive episodes: Secondary | ICD-10-CM

## 2013-10-08 DIAGNOSIS — M76899 Other specified enthesopathies of unspecified lower limb, excluding foot: Secondary | ICD-10-CM

## 2013-10-08 DIAGNOSIS — E785 Hyperlipidemia, unspecified: Secondary | ICD-10-CM

## 2013-10-08 DIAGNOSIS — J942 Hemothorax: Secondary | ICD-10-CM

## 2013-10-08 DIAGNOSIS — R1011 Right upper quadrant pain: Secondary | ICD-10-CM

## 2013-10-08 DIAGNOSIS — I10 Essential (primary) hypertension: Secondary | ICD-10-CM | POA: Diagnosis present

## 2013-10-08 DIAGNOSIS — E871 Hypo-osmolality and hyponatremia: Secondary | ICD-10-CM

## 2013-10-08 DIAGNOSIS — D126 Benign neoplasm of colon, unspecified: Secondary | ICD-10-CM

## 2013-10-08 DIAGNOSIS — R350 Frequency of micturition: Secondary | ICD-10-CM

## 2013-10-08 DIAGNOSIS — R339 Retention of urine, unspecified: Secondary | ICD-10-CM

## 2013-10-08 DIAGNOSIS — G9340 Encephalopathy, unspecified: Secondary | ICD-10-CM

## 2013-10-08 DIAGNOSIS — F1920 Other psychoactive substance dependence, uncomplicated: Secondary | ICD-10-CM

## 2013-10-08 DIAGNOSIS — I739 Peripheral vascular disease, unspecified: Secondary | ICD-10-CM

## 2013-10-08 DIAGNOSIS — M8580 Other specified disorders of bone density and structure, unspecified site: Secondary | ICD-10-CM

## 2013-10-08 DIAGNOSIS — R9430 Abnormal result of cardiovascular function study, unspecified: Secondary | ICD-10-CM

## 2013-10-08 DIAGNOSIS — R0789 Other chest pain: Secondary | ICD-10-CM

## 2013-10-08 DIAGNOSIS — G894 Chronic pain syndrome: Secondary | ICD-10-CM

## 2013-10-08 DIAGNOSIS — N63 Unspecified lump in unspecified breast: Secondary | ICD-10-CM

## 2013-10-08 DIAGNOSIS — IMO0002 Reserved for concepts with insufficient information to code with codable children: Secondary | ICD-10-CM

## 2013-10-08 DIAGNOSIS — R4182 Altered mental status, unspecified: Secondary | ICD-10-CM

## 2013-10-08 DIAGNOSIS — I2510 Atherosclerotic heart disease of native coronary artery without angina pectoris: Secondary | ICD-10-CM

## 2013-10-08 DIAGNOSIS — I495 Sick sinus syndrome: Secondary | ICD-10-CM

## 2013-10-08 LAB — URINE MICROSCOPIC-ADD ON

## 2013-10-08 LAB — CBC WITH DIFFERENTIAL/PLATELET
Basophils Absolute: 0 10*3/uL (ref 0.0–0.1)
Basophils Relative: 0 % (ref 0–1)
Eosinophils Absolute: 0.5 10*3/uL (ref 0.0–0.7)
Eosinophils Relative: 7 % — ABNORMAL HIGH (ref 0–5)
HEMATOCRIT: 30.8 % — AB (ref 36.0–46.0)
Hemoglobin: 10.2 g/dL — ABNORMAL LOW (ref 12.0–15.0)
LYMPHS ABS: 1.4 10*3/uL (ref 0.7–4.0)
Lymphocytes Relative: 20 % (ref 12–46)
MCH: 31.4 pg (ref 26.0–34.0)
MCHC: 33.1 g/dL (ref 30.0–36.0)
MCV: 94.8 fL (ref 78.0–100.0)
MONO ABS: 0.7 10*3/uL (ref 0.1–1.0)
Monocytes Relative: 11 % (ref 3–12)
Neutro Abs: 4.4 10*3/uL (ref 1.7–7.7)
Neutrophils Relative %: 62 % (ref 43–77)
Platelets: 167 10*3/uL (ref 150–400)
RBC: 3.25 MIL/uL — AB (ref 3.87–5.11)
RDW: 13.5 % (ref 11.5–15.5)
WBC: 7 10*3/uL (ref 4.0–10.5)

## 2013-10-08 LAB — BASIC METABOLIC PANEL
BUN: 20 mg/dL (ref 6–23)
CALCIUM: 9.4 mg/dL (ref 8.4–10.5)
CO2: 21 meq/L (ref 19–32)
Chloride: 103 mEq/L (ref 96–112)
Creatinine, Ser: 1.22 mg/dL — ABNORMAL HIGH (ref 0.50–1.10)
GFR calc Af Amer: 46 mL/min — ABNORMAL LOW (ref >90)
GFR calc non Af Amer: 40 mL/min — ABNORMAL LOW (ref >90)
GLUCOSE: 97 mg/dL (ref 70–99)
Potassium: 4.7 mEq/L (ref 3.7–5.3)
SODIUM: 140 meq/L (ref 137–147)

## 2013-10-08 LAB — TROPONIN I: Troponin I: <0.3 ng/mL (ref <0.30)

## 2013-10-08 LAB — HEPATIC FUNCTION PANEL
ALT: 16 U/L (ref 0–35)
AST: 30 U/L (ref 0–37)
Albumin: 3.4 g/dL — ABNORMAL LOW (ref 3.5–5.2)
Alkaline Phosphatase: 162 U/L — ABNORMAL HIGH (ref 39–117)
Bilirubin, Direct: <0.2 mg/dL (ref 0.0–0.3)
Total Bilirubin: 0.3 mg/dL (ref 0.3–1.2)
Total Protein: 6.8 g/dL (ref 6.0–8.3)

## 2013-10-08 LAB — URINALYSIS, ROUTINE W REFLEX MICROSCOPIC
Bilirubin Urine: NEGATIVE
GLUCOSE, UA: NEGATIVE mg/dL
Ketones, ur: NEGATIVE mg/dL
Nitrite: POSITIVE — AB
PH: 6 (ref 5.0–8.0)
Protein, ur: NEGATIVE mg/dL
Specific Gravity, Urine: 1.015 (ref 1.005–1.030)
Urobilinogen, UA: 0.2 mg/dL (ref 0.0–1.0)

## 2013-10-08 LAB — RAPID URINE DRUG SCREEN, HOSP PERFORMED
Amphetamines: NOT DETECTED
Barbiturates: NOT DETECTED
Benzodiazepines: POSITIVE — AB
Cocaine: NOT DETECTED
OPIATES: NOT DETECTED
Tetrahydrocannabinol: NOT DETECTED

## 2013-10-08 LAB — VITAMIN B12: VITAMIN B 12: 380 pg/mL (ref 211–911)

## 2013-10-08 LAB — T4, FREE: Free T4: 0.91 ng/dL (ref 0.80–1.80)

## 2013-10-08 LAB — MAGNESIUM: MAGNESIUM: 1.9 mg/dL (ref 1.5–2.5)

## 2013-10-08 LAB — RPR

## 2013-10-08 LAB — ETHANOL

## 2013-10-08 LAB — VALPROIC ACID LEVEL

## 2013-10-08 LAB — TSH: TSH: 2.6 u[IU]/mL (ref 0.350–4.500)

## 2013-10-08 MED ORDER — VITAMIN B-1 100 MG PO TABS
100.0000 mg | ORAL_TABLET | Freq: Every day | ORAL | Status: DC
  Administered 2013-10-08 – 2013-10-10 (×3): 100 mg via ORAL
  Filled 2013-10-08 (×3): qty 1

## 2013-10-08 MED ORDER — ACETAMINOPHEN 325 MG PO TABS
650.0000 mg | ORAL_TABLET | Freq: Four times a day (QID) | ORAL | Status: DC | PRN
  Administered 2013-10-09: 650 mg via ORAL
  Filled 2013-10-08: qty 2

## 2013-10-08 MED ORDER — HEPARIN SODIUM (PORCINE) 5000 UNIT/ML IJ SOLN
5000.0000 [IU] | Freq: Three times a day (TID) | INTRAMUSCULAR | Status: DC
  Administered 2013-10-08 – 2013-10-10 (×5): 5000 [IU] via SUBCUTANEOUS
  Filled 2013-10-08 (×9): qty 1

## 2013-10-08 MED ORDER — SODIUM CHLORIDE 0.9 % IJ SOLN
3.0000 mL | Freq: Two times a day (BID) | INTRAMUSCULAR | Status: DC
  Administered 2013-10-08 – 2013-10-09 (×2): 3 mL via INTRAVENOUS

## 2013-10-08 MED ORDER — SODIUM CHLORIDE 0.9 % IV SOLN
INTRAVENOUS | Status: AC
Start: 2013-10-08 — End: 2013-10-09
  Administered 2013-10-08: 17:00:00 via INTRAVENOUS

## 2013-10-08 MED ORDER — LORAZEPAM 2 MG/ML IJ SOLN
0.5000 mg | Freq: Four times a day (QID) | INTRAMUSCULAR | Status: DC | PRN
  Administered 2013-10-08: 0.5 mg via INTRAVENOUS
  Filled 2013-10-08: qty 1

## 2013-10-08 MED ORDER — ASPIRIN EC 81 MG PO TBEC
81.0000 mg | DELAYED_RELEASE_TABLET | Freq: Every day | ORAL | Status: DC
  Administered 2013-10-09 – 2013-10-10 (×2): 81 mg via ORAL
  Filled 2013-10-08 (×2): qty 1

## 2013-10-08 MED ORDER — METHADONE HCL 10 MG PO TABS
10.0000 mg | ORAL_TABLET | Freq: Four times a day (QID) | ORAL | Status: DC | PRN
  Administered 2013-10-10: 10 mg via ORAL
  Filled 2013-10-08: qty 1

## 2013-10-08 MED ORDER — DIVALPROEX SODIUM 500 MG PO DR TAB
500.0000 mg | DELAYED_RELEASE_TABLET | Freq: Three times a day (TID) | ORAL | Status: DC
  Administered 2013-10-08 – 2013-10-10 (×5): 500 mg via ORAL
  Filled 2013-10-08 (×8): qty 1

## 2013-10-08 MED ORDER — HALOPERIDOL LACTATE 5 MG/ML IJ SOLN
2.5000 mg | Freq: Once | INTRAMUSCULAR | Status: AC
  Administered 2013-10-08: 2.5 mg via INTRAVENOUS
  Filled 2013-10-08: qty 1

## 2013-10-08 MED ORDER — ONDANSETRON HCL 4 MG PO TABS: 4.0000 mg | ORAL_TABLET | Freq: Four times a day (QID) | ORAL | Status: DC | PRN

## 2013-10-08 MED ORDER — PANTOPRAZOLE SODIUM 40 MG PO TBEC
40.0000 mg | DELAYED_RELEASE_TABLET | Freq: Two times a day (BID) | ORAL | Status: DC
  Administered 2013-10-08 – 2013-10-10 (×4): 40 mg via ORAL
  Filled 2013-10-08 (×4): qty 1

## 2013-10-08 MED ORDER — DEXTROSE 5 % IV SOLN
1.0000 g | INTRAVENOUS | Status: DC
  Administered 2013-10-09 – 2013-10-10 (×2): 1 g via INTRAVENOUS
  Filled 2013-10-08 (×2): qty 10

## 2013-10-08 MED ORDER — ESTROGENS, CONJUGATED 0.625 MG/GM VA CREA
1.0000 g | TOPICAL_CREAM | VAGINAL | Status: DC
  Filled 2013-10-08: qty 42.5

## 2013-10-08 MED ORDER — DOCUSATE SODIUM 100 MG PO CAPS
100.0000 mg | ORAL_CAPSULE | Freq: Two times a day (BID) | ORAL | Status: DC
  Administered 2013-10-08 – 2013-10-10 (×5): 100 mg via ORAL
  Filled 2013-10-08 (×6): qty 1

## 2013-10-08 MED ORDER — ONDANSETRON HCL 4 MG/2ML IJ SOLN: 4.0000 mg | Freq: Four times a day (QID) | INTRAMUSCULAR | Status: DC | PRN

## 2013-10-08 MED ORDER — DEXTROSE 5 % IV SOLN
1.0000 g | Freq: Once | INTRAVENOUS | Status: AC
  Administered 2013-10-08: 1 g via INTRAVENOUS
  Filled 2013-10-08: qty 10

## 2013-10-08 MED ORDER — TAMSULOSIN HCL 0.4 MG PO CAPS
0.4000 mg | ORAL_CAPSULE | Freq: Every day | ORAL | Status: DC
  Administered 2013-10-08 – 2013-10-10 (×3): 0.4 mg via ORAL
  Filled 2013-10-08 (×3): qty 1

## 2013-10-08 MED ORDER — RISPERIDONE 1 MG PO TABS
1.0000 mg | ORAL_TABLET | Freq: Two times a day (BID) | ORAL | Status: DC
  Administered 2013-10-08 – 2013-10-10 (×4): 1 mg via ORAL
  Filled 2013-10-08 (×5): qty 1

## 2013-10-08 MED ORDER — ACETAMINOPHEN 650 MG RE SUPP: 650.0000 mg | Freq: Four times a day (QID) | RECTAL | Status: DC | PRN

## 2013-10-08 MED ORDER — ALBUTEROL SULFATE (2.5 MG/3ML) 0.083% IN NEBU: 2.5000 mg | INHALATION_SOLUTION | RESPIRATORY_TRACT | Status: DC | PRN

## 2013-10-08 MED ORDER — SODIUM CHLORIDE 0.9 % IV BOLUS (SEPSIS)
500.0000 mL | Freq: Once | INTRAVENOUS | Status: AC
  Administered 2013-10-08: 500 mL via INTRAVENOUS

## 2013-10-08 MED ORDER — METOPROLOL SUCCINATE ER 50 MG PO TB24
50.0000 mg | ORAL_TABLET | ORAL | Status: DC
  Administered 2013-10-08: 50 mg via ORAL
  Filled 2013-10-08 (×3): qty 1

## 2013-10-08 MED ORDER — METOPROLOL SUCCINATE ER 25 MG PO TB24
25.0000 mg | ORAL_TABLET | ORAL | Status: DC
  Filled 2013-10-08: qty 1

## 2013-10-08 MED ORDER — LORAZEPAM 1 MG PO TABS
1.0000 mg | ORAL_TABLET | Freq: Three times a day (TID) | ORAL | Status: DC | PRN
  Administered 2013-10-08: 1 mg via ORAL
  Filled 2013-10-08: qty 1

## 2013-10-08 MED ORDER — ZOLPIDEM TARTRATE 5 MG PO TABS
5.0000 mg | ORAL_TABLET | Freq: Every evening | ORAL | Status: DC | PRN
  Administered 2013-10-08: 5 mg via ORAL
  Filled 2013-10-08: qty 1

## 2013-10-08 MED ORDER — FLUOXETINE HCL 10 MG PO CAPS
10.0000 mg | ORAL_CAPSULE | Freq: Every day | ORAL | Status: DC
  Administered 2013-10-08 – 2013-10-10 (×3): 10 mg via ORAL
  Filled 2013-10-08 (×3): qty 1

## 2013-10-08 MED ORDER — ANASTROZOLE 1 MG PO TABS
1.0000 mg | ORAL_TABLET | Freq: Every day | ORAL | Status: DC
  Administered 2013-10-08 – 2013-10-10 (×3): 1 mg via ORAL
  Filled 2013-10-08 (×3): qty 1

## 2013-10-08 NOTE — ED Notes (Signed)
Care transferred, report received Lars Masson, RN

## 2013-10-08 NOTE — ED Notes (Signed)
Pt had a small run of Vtach. Printed and given to Dr. Leonides Schanz.

## 2013-10-08 NOTE — Progress Notes (Signed)
UR Completed Seletha Zimmermann Graves-Bigelow, RN,BSN [phone]  

## 2013-10-08 NOTE — H&P (Addendum)
[facility] History and Physical  [name] FEE:[id number] DOB: 07-31-1927 DOA: 10/08/2013   PCP: [name], MD  Specialists: She is followed by cardiology  Chief Complaint: Not herself  HPI: [name] is a 80 y.o. female with a past medical history of coronary artery disease, breast cancer, depression, psychosis who lives with her husband. Her daughter also cares for her. Patient doesn't speak any [place]. Daughter was at the bedside, who interpreted. Apparently, for the last 3 days patient has not been acting normally. She's had some weakness and poor balance. She has been speaking without making much sense. She hasn't slept in 3 days. And all this got worse yesterday. She was taken for a routine visit to her primary care physician. The PCP thought that she was having paranoid symptoms. The daughter also tells me that for the past 6 months or so patient's memory has been getting worse. She's had on and off symptoms of confusion. More recently denies any history of fever, chills. Had couple episodes of vomiting 2 days ago, but none recently. No diarrhea, but she has been constipated. Since her symptoms were getting worse she was brought into the hospital. So history is limited due to language barrier and cognitive impairment.  Home Medications: Prior to Admission medications   Medication Sig Start Date End Date Taking? Authorizing Provider  anastrozole (ARIMIDEX) 1 MG tablet Take 1 mg by mouth daily.   Yes Historical Provider, MD  conjugated estrogens (PREMARIN) vaginal cream Place 0.5 Applicatorfuls vaginally 2 (two) times a week. 04/10/11  Yes Aleksei Plotnikov V, MD  diphenhydrAMINE (BENADRYL) 25 mg capsule Take 25 mg by mouth every 6 (six) hours as needed.   Yes Historical Provider, MD  divalproex (DEPAKOTE) 500 MG DR tablet Take 1 tablet (500 mg total) by mouth 3 (three) times daily. 08/14/13  Yes Aleksei Plotnikov V, MD  FLUoxetine (PROZAC) 10 MG capsule Take  10 mg by mouth daily.   Yes Historical Provider, MD  indapamide (LOZOL) 2.5 MG tablet Take 1 tablet (2.5 mg total) by mouth daily as needed. Only takes if blood pressure is high 02/03/13  Yes Aleksei Plotnikov V, MD  LORazepam (ATIVAN) 1 MG tablet Take 1 mg by mouth every 8 (eight) hours as needed for anxiety.   Yes Historical Provider, MD  methadone (DOLOPHINE) 10 MG tablet Take 1 tablet (10 mg total) by mouth 5 (five) times daily as needed for severe pain. Fill on or after 12/29/13. Once every 30 days 10/07/13  Yes Aleksei Plotnikov V, MD  metoprolol succinate (TOPROL-XL) 25 MG 24 hr tablet Take 1 tablet (25 mg total) by mouth daily. 02/03/13  Yes Aleksei Plotnikov V, MD  omeprazole (PRILOSEC) 20 MG capsule Take 1 capsule (20 mg total) by mouth 2 (two) times daily. 02/03/13  Yes Aleksei Plotnikov V, MD  promethazine (PHENERGAN) 25 MG tablet Take 1 tablet (25 mg total) by mouth every 8 (eight) hours as needed for nausea. 02/03/13  Yes Aleksei Plotnikov V, MD  risperiDONE (RISPERDAL) 1 MG tablet Take 1 tablet (1 mg total) by mouth 2 (two) times daily. 10/07/13  Yes Aleksei Plotnikov V, MD  tamsulosin (FLOMAX) 0.4 MG CAPS Take 1 capsule (0.4 mg total) by mouth daily. 12/02/12  Yes Aleksei Plotnikov V, MD  trimethoprim (TRIMPEX) 100 MG tablet Take 1 tablet (100 mg total) by mouth daily. 04/07/13  Yes Aleksei Plotnikov V, MD  Vitamin D, Ergocalciferol, (DRISDOL) 50000 UNITS CAPS Take 1 capsule (50,000 Units total) by mouth every  30 (thirty) days. 09/06/11  Yes Aleksei Plotnikov V, MD  zolpidem (AMBIEN) 10 MG tablet Take 1 tablet (10 mg total) by mouth at bedtime as needed for sleep. 03/16/13  Yes Cassandria Anger, MD    Allergies:  Allergies  Allergen Reactions  . Baclofen     REACTION: confusion  . Belladonna Other (See Comments)    unknown  . Doxycycline Other (See Comments)    unknown  . Iohexol      Code: RASH   . Metoclopramide     Clonic spasms  . Metronidazole     REACTION: rash  .  Nitrofurantoin     REACTION: nausea  . Procaine Hcl   . Sucralfate     REACTION: nausea  . Ciprofloxacin Itching and Rash  . Erythromycin Rash and Other (See Comments)    fever  . Gabapentin Rash  . Methenamine Rash  . Nalbuphine Rash  . Penicillins Rash  . Sulfonamide Derivatives Rash    Past Medical History: Past Medical History  Diagnosis Date  . CAD (coronary artery disease)     Catheterization, January, 2007 patent grafts / hospitalization October 2 010 no MI  . Syncope 03/2009    ?orthostasis  . Allergic rhinitis   . History of colon polyps   . Diverticulosis of colon   . GERD (gastroesophageal reflux disease)   . Hyperlipidemia   . Abdominal pain, other specified site     motility disorder, chorinic functional, severe (Dr Olevia Perches). Possible OCD w/complusive use of enemas and laxatives; psychotic features 2011  . Positive H. pylori test 2911  . Psychosomatic disorder     w/GI fixation  . Anxiety   . Depression   . Stress incontinence, female   . Vitamin D deficiency   . Vitamin B12 deficiency   . Renal insufficiency   . UTI (urinary tract infection)   . Hypokalemia     mild  . Insomnia   . Carotid artery disease     Doppler November, 6808, 8-11% LICA, 03-15% R. ICA.Marland Kitchen increased velocity... possibly from serpentine vessel  . Ejection fraction     50-55%, no wall motion abnormalities, echo, November, 2010  . Edema   . Hemothorax on right   . Breast cancer 2011    Dr Humphrey Rolls- right breast    Past Surgical History  Procedure Laterality Date  . Coronary artery bypass graft    . Myomectomy      Uterine and ovarian  . Breast fibroadenoma surgery    . Hemicolectomy  2005    Left  . Breast lumpectomy  2011  . Right vats (video-assisted thoracoscopic surgery) with      Social History: Patient lives with her husband. No smoking, alcohol use illicit drug use. Uses a cane to ambulate.  Family History:  Family History  Problem Relation Age of Onset  . Coronary  artery disease      Female 1st degree relative <60  . Colon cancer Mother   . Arthritis Mother   . Heart disease Mother   . Heart disease Father      Review of Systems - Unable to obtain due to cognitive impairment  Physical Examination  Filed Vitals:   10/08/13 1000 10/08/13 1015 10/08/13 1100 10/08/13 1115  BP: 166/66 153/58 165/70 153/77  Pulse: 70 60 72 73  Temp:      TempSrc:      Resp: $Remo'22 19 23 18  'WyycD$ Height:      Weight:  SpO2: 95% 98% 95% 96%    BP 153/77  Pulse 73  Temp(Src) 98.6 F (37 C) (Oral)  Resp 18  Ht $R'5\' 2"'vd$  (1.575 m)  Wt 72.576 kg (160 lb)  BMI 29.26 kg/m2  SpO2 96%  General appearance: appears stated age, distracted, no distress and slowed mentation Head: Normocephalic, without obvious abnormality, atraumatic Eyes: conjunctivae/corneas clear. PERRL, EOM's intact. Throat: lips, mucosa, and tongue normal; teeth and gums normal Neck: no adenopathy, no carotid bruit, no JVD, supple, symmetrical, trachea midline and thyroid not enlarged, symmetric, no tenderness/mass/nodules Resp: clear to auscultation bilaterally Cardio: regular rate and rhythm, S1, S2 normal, no murmur, click, rub or gallop GI: soft, non-tender; bowel sounds normal; no masses,  no organomegaly Extremities: extremities normal, atraumatic, no cyanosis or edema Pulses: 2+ and symmetric Skin: Skin color, texture, turgor normal. No rashes or lesions Lymph nodes: Cervical, supraclavicular, and axillary nodes normal. Neurologic: She appears to be alert. Distracted. No facial droop. Motor strength was equal in bilateral upper and lower extremities.  Laboratory Data: Results for orders placed during the hospital encounter of 10/08/13 (from the past 48 hour(s))  VALPROIC ACID LEVEL     Status: Abnormal   Collection Time    10/08/13  8:07 AM      Result Value Ref Range   Valproic Acid Lvl <10.0 (*) 50.0 - 100.0 ug/mL  ETHANOL     Status: None   Collection Time    10/08/13  8:07 AM       Result Value Ref Range   Alcohol, Ethyl (B) <11  0 - 11 mg/dL   Comment:            LOWEST DETECTABLE LIMIT FOR     SERUM ALCOHOL IS 11 mg/dL     FOR MEDICAL PURPOSES ONLY  TROPONIN I     Status: None   Collection Time    10/08/13  8:45 AM      Result Value Ref Range   Troponin I <0.30  <0.30 ng/mL   Comment:            Due to the release kinetics of cTnI,     a negative result within the first hours     of the onset of symptoms does not rule out     myocardial infarction with certainty.     If myocardial infarction is still suspected,     repeat the test at appropriate intervals.  URINALYSIS, ROUTINE W REFLEX MICROSCOPIC     Status: Abnormal   Collection Time    10/08/13  9:01 AM      Result Value Ref Range   Color, Urine YELLOW  YELLOW   APPearance TURBID (*) CLEAR   Specific Gravity, Urine 1.015  1.005 - 1.030   pH 6.0  5.0 - 8.0   Glucose, UA NEGATIVE  NEGATIVE mg/dL   Hgb urine dipstick TRACE (*) NEGATIVE   Bilirubin Urine NEGATIVE  NEGATIVE   Ketones, ur NEGATIVE  NEGATIVE mg/dL   Protein, ur NEGATIVE  NEGATIVE mg/dL   Urobilinogen, UA 0.2  0.0 - 1.0 mg/dL   Nitrite POSITIVE (*) NEGATIVE   Leukocytes, UA LARGE (*) NEGATIVE  URINE RAPID DRUG SCREEN (HOSP PERFORMED)     Status: Abnormal   Collection Time    10/08/13  9:01 AM      Result Value Ref Range   Opiates NONE DETECTED  NONE DETECTED   Cocaine NONE DETECTED  NONE DETECTED   Benzodiazepines POSITIVE (*) NONE DETECTED  Amphetamines NONE DETECTED  NONE DETECTED   Tetrahydrocannabinol NONE DETECTED  NONE DETECTED   Barbiturates NONE DETECTED  NONE DETECTED   Comment:            DRUG SCREEN FOR MEDICAL PURPOSES     ONLY.  IF CONFIRMATION IS NEEDED     FOR ANY PURPOSE, NOTIFY LAB     WITHIN 5 DAYS.                LOWEST DETECTABLE LIMITS     FOR URINE DRUG SCREEN     Drug Class       Cutoff (ng/mL)     Amphetamine      1000     Barbiturate      200     Benzodiazepine   109     Tricyclics       323      Opiates          300     Cocaine          300     THC              50  URINE MICROSCOPIC-ADD ON     Status: Abnormal   Collection Time    10/08/13  9:01 AM      Result Value Ref Range   Squamous Epithelial / LPF FEW (*) RARE   WBC, UA TOO NUMEROUS TO COUNT  <3 WBC/hpf   RBC / HPF 3-6  <3 RBC/hpf   Bacteria, UA MANY (*) RARE  CBC WITH DIFFERENTIAL     Status: Abnormal   Collection Time    10/08/13  9:05 AM      Result Value Ref Range   WBC 7.0  4.0 - 10.5 K/uL   RBC 3.25 (*) 3.87 - 5.11 MIL/uL   Hemoglobin 10.2 (*) 12.0 - 15.0 g/dL   HCT 30.8 (*) 36.0 - 46.0 %   MCV 94.8  78.0 - 100.0 fL   MCH 31.4  26.0 - 34.0 pg   MCHC 33.1  30.0 - 36.0 g/dL   RDW 13.5  11.5 - 15.5 %   Platelets 167  150 - 400 K/uL   Neutrophils Relative % 62  43 - 77 %   Neutro Abs 4.4  1.7 - 7.7 K/uL   Lymphocytes Relative 20  12 - 46 %   Lymphs Abs 1.4  0.7 - 4.0 K/uL   Monocytes Relative 11  3 - 12 %   Monocytes Absolute 0.7  0.1 - 1.0 K/uL   Eosinophils Relative 7 (*) 0 - 5 %   Eosinophils Absolute 0.5  0.0 - 0.7 K/uL   Basophils Relative 0  0 - 1 %   Basophils Absolute 0.0  0.0 - 0.1 K/uL  BASIC METABOLIC PANEL     Status: Abnormal   Collection Time    10/08/13  9:05 AM      Result Value Ref Range   Sodium 140  137 - 147 mEq/L   Potassium 4.7  3.7 - 5.3 mEq/L   Comment: HEMOLYZED SPECIMEN, RESULTS MAY BE AFFECTED   Chloride 103  96 - 112 mEq/L   CO2 21  19 - 32 mEq/L   Glucose, Bld 97  70 - 99 mg/dL   BUN 20  6 - 23 mg/dL   Creatinine, Ser 1.22 (*) 0.50 - 1.10 mg/dL   Calcium 9.4  8.4 - 10.5 mg/dL   GFR calc non Af Amer 40 (*) >90  mL/min   GFR calc Af Amer 46 (*) >90 mL/min   Comment: (NOTE)     The eGFR has been calculated using the CKD EPI equation.     This calculation has not been validated in all clinical situations.     eGFR's persistently <90 mL/min signify possible Chronic Kidney     Disease.    Radiology Reports: Dg Chest 2 View  10/08/2013   CLINICAL DATA:  Altered mental  status.  EXAM: CHEST  2 VIEW  COMPARISON:  PA and lateral chest of January 21, 2013  FINDINGS: The lungs are better inflated today. There is new linear density peripherally in the right midlung and there is blunting of the costophrenic angles bilaterally. The cardiac silhouette is top-normal in size. The pulmonary vascularity is normal. There is tortuosity of the descending thoracic aorta. The patient has undergone previous CABG. There are degenerative changes of both shoulders.  IMPRESSION: Small amounts of pleural fluid are present at the lung bases which may reflect low-grade compensated CHF. No significant interstitial or alveolar edema is demonstrated. Density in the right midlung likely reflects subsegmental atelectasis.   Electronically Signed   By: David  Martinique   On: 10/08/2013 08:11   Ct Head Wo Contrast  10/08/2013   CLINICAL DATA:  ALTERED MENTAL STATUS  EXAM: CT HEAD WITHOUT CONTRAST  TECHNIQUE: Contiguous axial images were obtained from the base of the skull through the vertex without intravenous contrast.  COMPARISON:  12/25/2010  FINDINGS: Chronic left posterior parietal encephalomalacia. Diffuse parenchymal atrophy. Patchy areas of hypoattenuation in deep and periventricular white matter bilaterally. Negative for acute intracranial hemorrhage, mass lesion, acute infarction, midline shift, or mass-effect. Acute infarct may be inapparent on noncontrast CT. Ventricles and sulci symmetric. Bone windows demonstrate no focal lesion.  IMPRESSION: 1. Negative for bleed or other acute intracranial process. 2. Atrophy and nonspecific white matter changes as above.   Electronically Signed   By: Arne Cleveland M.D.   On: 10/08/2013 08:29    Electrocardiogram: EKG shows sinus rhythm at 72 beats a minute. Left axis deviation. Q waves noted inferiorly. T inversion noted in lead 3 and aVF and also in V3 through V6. No concerning ST changes. EKG from September of 2014 was reviewed and it showed T flattening  in V3 to V6 and T inversion was noted in the inferior leads.  Problem List  Principal Problem:   Acute encephalopathy Active Problems:   PSYCHOSIS   DEPRESSION   BREAST CANCER, HX OF   CAD (coronary artery disease)   UTI (urinary tract infection)   Assessment: This is an 80 year old, Caucasian female originally from [place] who doesn't speak English, who presents with a few day history of worsening confusion. She is found to have a urinary tract infection, which is the most likely reason for her acute encephalopathy. She does have the other vascular disease, and, so could have CNS vascular disease, as well. She does not have any focal neurological deficits however.  Plan: #1 acute encephalopathy: Most likely due to UTI. Due to her other vascular disease, and the fact, that her mentation has been getting worse over the last 6 months we will proceed with MRI of the brain. We will also do dementia workup in the form of TSH, RPR Z61 and folic acid. Check LFTs  #2 urinary tract infection: Treat with ceftriaxone for now. Cultures have been sent.  #3 brief episode of nonsustained VT: Patient remained asymptomatic. EKG does not show any clear-cut ischemic changes.  Troponin is normal. Potassium is normal. Magnesium level is pending. Cardiology was consulted by the ED physician. Echocardiogram will be obtained. Last echo was from 5 years ago, which showed normal systolic function at that time.  #4 possible cognitive impairment/psychosis/depression: Please see #1. Monitor for behavioral issues. Continue with her home medications including Depakote, Risperdal.  #5 history of breast cancer: Continue with her home medications including Arimidex.  #6 history of coronary artery disease: Appears to be stable. Cycle troponins. Continue with beta blocker. She's not on aspirin, which will be prescribed.  #7 chronic pain issues: Continue with methadone   DVT Prophylaxis: Heparin Code Status: Full  code Family Communication: Discussed with the daughter and husband  Disposition Plan: Admit to telemetry for now. Consult PT and OT. Daughter mentioned difficulties of handling the patient at home. Patient may need to be placed in a skilled nursing facility.  Further management decisions will depend on results of further testing and patient's response to treatment.   Bonnielee Haff  [facility] Pager [phone]  If 7PM-7AM, please contact night-coverage [email or web address] Password [email or web address]  10/08/2013, 11:35 AM  Disclaimer: This note was dictated with voice recognition software. Similar sounding words can inadvertently be transcribed and may not be corrected upon review.

## 2013-10-08 NOTE — Progress Notes (Signed)
Pt lying in bed with eyes closed. No s/s of distress noted. Husband at bedside.

## 2013-10-08 NOTE — Progress Notes (Signed)
Pt very agitated Dr. [place] Pink made aware. Orders for haldol given. Pt being very aggressive toward staff and daughter. Will not stay in room. Pt pulling at tele, and continuously saying she is going home.

## 2013-10-08 NOTE — Consult Note (Signed)
Cardiologist:  [name] Reason for Consult: NSVT Referring Physician:   [name] is an 80 y.o. female.  HPI:   The patient is an 80yo female, who is a retired Engineer, drilling, with a history of CAD, CABG x4(LIMA to LAD, SVG to diagonal, SVG to OM, SVG to PDA) in 2004, PAD, syncope, HLD, psychocsomatic DO, depression, breast caner, GERD, right hemothorax.  Her last heart cath was 2007 and grafts were patent, LAD 90%, circumflex 80%, and RCA occluded.  Her last carotid dopplers were in Sept 2014 and showed bilateral 60-79% disease.  Her last echocardiogram was Nov 2010-EF 50-55% with normal wall motion, grade one diastolic dysfunction.  The patient presents with confusion which has worsened in the last three days.   Head CT negative for bleed or other acute intracranial process.  CXR shows small right pleural effusion.  UA revealed UTI.  We are asked to see her for NSVT.   While in the ER she had a 9 beat run of slow VT; rate ~110bpm.  She complains of ABD pain which is chronic, some nausea yesterday and some ankle swelling.  She denies vomiting, fever, chest pain, shortness of breath, orthopnea, dizziness, PND, cough, congestion, hematochezia.   Past Medical History  Diagnosis Date  . CAD (coronary artery disease)     Catheterization, January, 2007 patent grafts / hospitalization October 2 010 no MI  . Syncope 03/2009    ?orthostasis  . Allergic rhinitis   . History of colon polyps   . Diverticulosis of colon   . GERD (gastroesophageal reflux disease)   . Hyperlipidemia   . Abdominal pain, other specified site     motility disorder, chorinic functional, severe (Dr Olevia Perches). Possible OCD w/complusive use of enemas and laxatives; psychotic features 2011  . Positive H. pylori test 2911  . Psychosomatic disorder     w/GI fixation  . Anxiety   . Depression   . Stress incontinence, female   . Vitamin D deficiency   . Vitamin B12 deficiency   . Renal insufficiency   . UTI (urinary tract  infection)   . Hypokalemia     mild  . Insomnia   . Carotid artery disease     Doppler November, 6803, 2-12% LICA, 24-82% R. ICA.Marland Kitchen increased velocity... possibly from serpentine vessel  . Ejection fraction     50-55%, no wall motion abnormalities, echo, November, 2010  . Edema   . Hemothorax on right   . Breast cancer 2011    Dr Humphrey Rolls- right breast    Past Surgical History  Procedure Laterality Date  . Coronary artery bypass graft    . Myomectomy      Uterine and ovarian  . Breast fibroadenoma surgery    . Hemicolectomy  2005    Left  . Breast lumpectomy  2011  . Right vats (video-assisted thoracoscopic surgery) with      Family History  Problem Relation Age of Onset  . Coronary artery disease      Female 1st degree relative <60  . Colon cancer Mother   . Arthritis Mother   . Heart disease Mother   . Heart disease Father     Social History:  reports that she has never smoked. She does not have any smokeless tobacco history on file. She reports that she does not drink alcohol or use illicit drugs.  Allergies:  Allergies  Allergen Reactions  . Baclofen     REACTION: confusion  . Belladonna Other (See  Comments)    unknown  . Doxycycline Other (See Comments)    unknown  . Iohexol      Code: RASH   . Metoclopramide     Clonic spasms  . Metronidazole     REACTION: rash  . Nitrofurantoin     REACTION: nausea  . Procaine Hcl   . Sucralfate     REACTION: nausea  . Ciprofloxacin Itching and Rash  . Erythromycin Rash and Other (See Comments)    fever  . Gabapentin Rash  . Methenamine Rash  . Nalbuphine Rash  . Penicillins Rash  . Sulfonamide Derivatives Rash    Medications:  Prior to Admission medications   Medication Sig Start Date End Date Taking? Authorizing Provider  anastrozole (ARIMIDEX) 1 MG tablet Take 1 mg by mouth daily.   Yes Historical Provider, MD  conjugated estrogens (PREMARIN) vaginal cream Place 0.5 Applicatorfuls vaginally 2 (two) times a  week. 04/10/11  Yes Aleksei Plotnikov V, MD  diphenhydrAMINE (BENADRYL) 25 mg capsule Take 25 mg by mouth every 6 (six) hours as needed.   Yes Historical Provider, MD  divalproex (DEPAKOTE) 500 MG DR tablet Take 1 tablet (500 mg total) by mouth 3 (three) times daily. 08/14/13  Yes Aleksei Plotnikov V, MD  FLUoxetine (PROZAC) 10 MG capsule Take 10 mg by mouth daily.   Yes Historical Provider, MD  indapamide (LOZOL) 2.5 MG tablet Take 1 tablet (2.5 mg total) by mouth daily as needed. Only takes if blood pressure is high 02/03/13  Yes Aleksei Plotnikov V, MD  LORazepam (ATIVAN) 1 MG tablet Take 1 mg by mouth every 8 (eight) hours as needed for anxiety.   Yes Historical Provider, MD  methadone (DOLOPHINE) 10 MG tablet Take 1 tablet (10 mg total) by mouth 5 (five) times daily as needed for severe pain. Fill on or after 12/29/13. Once every 30 days 10/07/13  Yes Aleksei Plotnikov V, MD  metoprolol succinate (TOPROL-XL) 25 MG 24 hr tablet Take 1 tablet (25 mg total) by mouth daily. 02/03/13  Yes Aleksei Plotnikov V, MD  omeprazole (PRILOSEC) 20 MG capsule Take 1 capsule (20 mg total) by mouth 2 (two) times daily. 02/03/13  Yes Aleksei Plotnikov V, MD  promethazine (PHENERGAN) 25 MG tablet Take 1 tablet (25 mg total) by mouth every 8 (eight) hours as needed for nausea. 02/03/13  Yes Aleksei Plotnikov V, MD  risperiDONE (RISPERDAL) 1 MG tablet Take 1 tablet (1 mg total) by mouth 2 (two) times daily. 10/07/13  Yes Aleksei Plotnikov V, MD  tamsulosin (FLOMAX) 0.4 MG CAPS Take 1 capsule (0.4 mg total) by mouth daily. 12/02/12  Yes Aleksei Plotnikov V, MD  trimethoprim (TRIMPEX) 100 MG tablet Take 1 tablet (100 mg total) by mouth daily. 04/07/13  Yes Aleksei Plotnikov V, MD  Vitamin D, Ergocalciferol, (DRISDOL) 50000 UNITS CAPS Take 1 capsule (50,000 Units total) by mouth every 30 (thirty) days. 09/06/11  Yes Aleksei Plotnikov V, MD  zolpidem (AMBIEN) 10 MG tablet Take 1 tablet (10 mg total) by mouth at bedtime as needed for  sleep. 03/16/13  Yes Lew Dawes V, MD     Results for orders placed during the hospital encounter of 10/08/13 (from the past 48 hour(s))  VALPROIC ACID LEVEL     Status: Abnormal   Collection Time    10/08/13  8:07 AM      Result Value Ref Range   Valproic Acid Lvl <10.0 (*) 50.0 - 100.0 ug/mL  ETHANOL     Status: None  Collection Time    10/08/13  8:07 AM      Result Value Ref Range   Alcohol, Ethyl (B) <11  0 - 11 mg/dL   Comment:            LOWEST DETECTABLE LIMIT FOR     SERUM ALCOHOL IS 11 mg/dL     FOR MEDICAL PURPOSES ONLY  TROPONIN I     Status: None   Collection Time    10/08/13  8:45 AM      Result Value Ref Range   Troponin I <0.30  <0.30 ng/mL   Comment:            Due to the release kinetics of cTnI,     a negative result within the first hours     of the onset of symptoms does not rule out     myocardial infarction with certainty.     If myocardial infarction is still suspected,     repeat the test at appropriate intervals.  URINALYSIS, ROUTINE W REFLEX MICROSCOPIC     Status: Abnormal   Collection Time    10/08/13  9:01 AM      Result Value Ref Range   Color, Urine YELLOW  YELLOW   APPearance TURBID (*) CLEAR   Specific Gravity, Urine 1.015  1.005 - 1.030   pH 6.0  5.0 - 8.0   Glucose, UA NEGATIVE  NEGATIVE mg/dL   Hgb urine dipstick TRACE (*) NEGATIVE   Bilirubin Urine NEGATIVE  NEGATIVE   Ketones, ur NEGATIVE  NEGATIVE mg/dL   Protein, ur NEGATIVE  NEGATIVE mg/dL   Urobilinogen, UA 0.2  0.0 - 1.0 mg/dL   Nitrite POSITIVE (*) NEGATIVE   Leukocytes, UA LARGE (*) NEGATIVE  URINE RAPID DRUG SCREEN (HOSP PERFORMED)     Status: Abnormal   Collection Time    10/08/13  9:01 AM      Result Value Ref Range   Opiates NONE DETECTED  NONE DETECTED   Cocaine NONE DETECTED  NONE DETECTED   Benzodiazepines POSITIVE (*) NONE DETECTED   Amphetamines NONE DETECTED  NONE DETECTED   Tetrahydrocannabinol NONE DETECTED  NONE DETECTED   Barbiturates NONE  DETECTED  NONE DETECTED   Comment:            DRUG SCREEN FOR MEDICAL PURPOSES     ONLY.  IF CONFIRMATION IS NEEDED     FOR ANY PURPOSE, NOTIFY LAB     WITHIN 5 DAYS.                LOWEST DETECTABLE LIMITS     FOR URINE DRUG SCREEN     Drug Class       Cutoff (ng/mL)     Amphetamine      1000     Barbiturate      200     Benzodiazepine   169     Tricyclics       678     Opiates          300     Cocaine          300     THC              50  URINE MICROSCOPIC-ADD ON     Status: Abnormal   Collection Time    10/08/13  9:01 AM      Result Value Ref Range   Squamous Epithelial / LPF FEW (*) RARE   WBC, UA TOO  NUMEROUS TO COUNT  <3 WBC/hpf   RBC / HPF 3-6  <3 RBC/hpf   Bacteria, UA MANY (*) RARE  CBC WITH DIFFERENTIAL     Status: Abnormal   Collection Time    10/08/13  9:05 AM      Result Value Ref Range   WBC 7.0  4.0 - 10.5 K/uL   RBC 3.25 (*) 3.87 - 5.11 MIL/uL   Hemoglobin 10.2 (*) 12.0 - 15.0 g/dL   HCT 30.8 (*) 36.0 - 46.0 %   MCV 94.8  78.0 - 100.0 fL   MCH 31.4  26.0 - 34.0 pg   MCHC 33.1  30.0 - 36.0 g/dL   RDW 13.5  11.5 - 15.5 %   Platelets 167  150 - 400 K/uL   Neutrophils Relative % 62  43 - 77 %   Neutro Abs 4.4  1.7 - 7.7 K/uL   Lymphocytes Relative 20  12 - 46 %   Lymphs Abs 1.4  0.7 - 4.0 K/uL   Monocytes Relative 11  3 - 12 %   Monocytes Absolute 0.7  0.1 - 1.0 K/uL   Eosinophils Relative 7 (*) 0 - 5 %   Eosinophils Absolute 0.5  0.0 - 0.7 K/uL   Basophils Relative 0  0 - 1 %   Basophils Absolute 0.0  0.0 - 0.1 K/uL  BASIC METABOLIC PANEL     Status: Abnormal   Collection Time    10/08/13  9:05 AM      Result Value Ref Range   Sodium 140  137 - 147 mEq/L   Potassium 4.7  3.7 - 5.3 mEq/L   Comment: HEMOLYZED SPECIMEN, RESULTS MAY BE AFFECTED   Chloride 103  96 - 112 mEq/L   CO2 21  19 - 32 mEq/L   Glucose, Bld 97  70 - 99 mg/dL   BUN 20  6 - 23 mg/dL   Creatinine, Ser 1.22 (*) 0.50 - 1.10 mg/dL   Calcium 9.4  8.4 - 10.5 mg/dL   GFR calc non Af  Amer 40 (*) >90 mL/min   GFR calc Af Amer 46 (*) >90 mL/min   Comment: (NOTE)     The eGFR has been calculated using the CKD EPI equation.     This calculation has not been validated in all clinical situations.     eGFR's persistently <90 mL/min signify possible Chronic Kidney     Disease.    Dg Chest 2 View  10/08/2013   CLINICAL DATA:  Altered mental status.  EXAM: CHEST  2 VIEW  COMPARISON:  PA and lateral chest of January 21, 2013  FINDINGS: The lungs are better inflated today. There is new linear density peripherally in the right midlung and there is blunting of the costophrenic angles bilaterally. The cardiac silhouette is top-normal in size. The pulmonary vascularity is normal. There is tortuosity of the descending thoracic aorta. The patient has undergone previous CABG. There are degenerative changes of both shoulders.  IMPRESSION: Small amounts of pleural fluid are present at the lung bases which may reflect low-grade compensated CHF. No significant interstitial or alveolar edema is demonstrated. Density in the right midlung likely reflects subsegmental atelectasis.   Electronically Signed   By: David  Martinique   On: 10/08/2013 08:11   Ct Head Wo Contrast  10/08/2013   CLINICAL DATA:  ALTERED MENTAL STATUS  EXAM: CT HEAD WITHOUT CONTRAST  TECHNIQUE: Contiguous axial images were obtained from the base of the skull through the  vertex without intravenous contrast.  COMPARISON:  12/25/2010  FINDINGS: Chronic left posterior parietal encephalomalacia. Diffuse parenchymal atrophy. Patchy areas of hypoattenuation in deep and periventricular white matter bilaterally. Negative for acute intracranial hemorrhage, mass lesion, acute infarction, midline shift, or mass-effect. Acute infarct may be inapparent on noncontrast CT. Ventricles and sulci symmetric. Bone windows demonstrate no focal lesion.  IMPRESSION: 1. Negative for bleed or other acute intracranial process. 2. Atrophy and nonspecific white matter  changes as above.   Electronically Signed   By: Arne Cleveland M.D.   On: 10/08/2013 08:29    Review of Systems  Constitutional: Negative for fever and diaphoresis.  HENT: Negative for congestion.   Respiratory: Negative for shortness of breath.   Cardiovascular: Positive for leg swelling (Ankles). Negative for chest pain, palpitations, orthopnea and PND.  Gastrointestinal: Positive for nausea and abdominal pain (Chronic).  Musculoskeletal: Negative for myalgias.  Neurological: Negative for dizziness.  All other systems reviewed and are negative.  Blood pressure 156/65, pulse 64, temperature 98.6 F (37 C), temperature source Oral, resp. rate 21, height _0  (1.575 m), weight 160 lb (72.576 kg), SpO2 93.00%. Physical Exam   Nursing note and vitals reviewed. Constitutional: She appears well-developed and well-nourished. No distress.  HENT:  Head: Normocephalic and atraumatic.  Mouth/Throat: Oropharynx is clear and moist. No oropharyngeal exudate.  Eyes: EOM are normal. Pupils are equal, round, and reactive to light. No scleral icterus.  Neck: Normal range of motion. Neck supple.  Cardiovascular: Normal rate, regular rhythm, S1 normal and S2 normal.   No murmur heard. Pulses:      Radial pulses are 2+ on the right side, and 2+ on the left side.       Dorsalis pedis pulses are 2+ on the right side, and 2+ on the left side.  Respiratory: Effort normal and breath sounds normal. She has no wheezes. She has no rales.  GI: Soft. Bowel sounds are normal. She exhibits no distension. There is tenderness (diffuse).  Musculoskeletal: She exhibits edema (Trace ankle edema).  Lymphadenopathy:    She has no cervical adenopathy.  Neurological: She is alert. She exhibits normal muscle tone.  Skin: Skin is warm and dry.  Psychiatric: She has a normal mood and affect.    Assessment/Plan: Principal Problem:   Acute encephalopathy Active Problems:   NSVT (nonsustained ventricular  tachycardia) She had a short run of slow, wide VT.  EKG shows inf Q waves and inf lateral TWI which were present in [place] 06-25-2012 but more pronounced today.  She takes Toprol XL 25 mg.   No apparent CP or SOB.  The patient's family provided interpretation.  An echo was ordered.  Will check a magnesium.  Potassium WNL.  I will go a head and increase her dose of toprol XL to 85m.  Her HR currently is in the 70's.  If there is any change in the echo, we can consider a myovue.  She does not appear to be having any ischemic symptoms.     Hypertension  Increasing toprol.    UTI (urinary tract infection)   PSYCHOSIS   DEPRESSION   BREAST CANCER, HX OF   CAD (coronary artery disease)     [facility]arri Young [facility]/08/2013, 1:52 PM   The patient was seen with Mr. HSamara Snide [place]  I agree with his assessment and plan.  I have reviewed her recent EKGs.  There is evidence of an old inferior wall myocardial infarction.  On today's tracing the lateral T-wave  changes are more prominent.  Her initial troponin is normal.  She states she has not had any chest pain.  Her blood pressure is slightly elevated and with her recent 9 beat run of nonsustained VT we will increase her dose of metoprolol.  Her arrhythmia may be secondary to the underlying stress of her urinary tract infection.  We will update her echocardiogram.  Her previous echocardiogram in 2010 suggested septal lateral wall dyssynchrony but there were no regional wall motion abnormalities at that time to suggest an old inferior wall MI.  I suspect that her confusion will improve as her urinary tract infection response to antibiotics.

## 2013-10-08 NOTE — ED Notes (Signed)
Admitting MD at the bedside.  

## 2013-10-08 NOTE — ED Notes (Signed)
Patietn speaks russian, no english.  Family at bedside and speaks [place] well.  Per family, patient has not been making sense in the last 3 days, worse at night.  Afebrile at triage.

## 2013-10-08 NOTE — ED Notes (Signed)
Pt brought to room via wheelchair with family in tow; [place], [place], Richardson Landry, RN and Judson Roch, NT present in room

## 2013-10-08 NOTE — Progress Notes (Signed)
Pt still climbing out of bed and will not sit in chair. Pt very confused. Pt knows her husband, but not daughter. She thinks she is in [place]. VERY agitated. Dr. [place] Pink notified and new orders received.

## 2013-10-08 NOTE — ED Provider Notes (Signed)
TIME SEEN: 7:21 AM  CHIEF COMPLAINT: Altered mental status  HPI: Patient is an 80 year old female with history of coronary artery disease, hyperlipidemia, chronic functional abdominal pain with psychotic features, anxiety depression, dementia, prior right-sided breast cancer in 2011 who presents emergency Department with her family who are complaining that she has been more confused than normal and not making sense, very agitated at night. They state that the symptoms have been going on for "a long time" that has been worse over the past 3 days. She was seen by her primary care physician yesterday who increased her Risperdone.  She's only taken one dose of this medication and they state there has been no change. He denies she has had any other changes in medications. They deny that she has had any new fevers, cough, vomiting or diarrhea. She did have a fall today the patient denies that she had her head. She is not on anticoagulation. No focal weakness the family has noticed. Most of the history is provided by the patient's family as the patient is unable to give an accurate history or answer questions appropriately.  ROS: See HPI; unobtainable secondary to patient's altered mental status  PAST MEDICAL HISTORY/PAST SURGICAL HISTORY:  Past Medical History  Diagnosis Date  . CAD (coronary artery disease)     Catheterization, January, 2007 patent grafts / hospitalization October 2 010 no MI  . Syncope 03/2009    ?orthostasis  . Allergic rhinitis   . History of colon polyps   . Diverticulosis of colon   . GERD (gastroesophageal reflux disease)   . Hyperlipidemia   . Abdominal pain, other specified site     motility disorder, chorinic functional, severe (Dr Olevia Perches). Possible OCD w/complusive use of enemas and laxatives; psychotic features 2011  . Positive H. pylori test 2911  . Psychosomatic disorder     w/GI fixation  . Anxiety   . Depression   . Stress incontinence, female   . Vitamin D  deficiency   . Vitamin B12 deficiency   . Renal insufficiency   . UTI (urinary tract infection)   . Hypokalemia     mild  . Insomnia   . Carotid artery disease     Doppler November, 6237, 6-28% LICA, 31-51% R. ICA.Marland Kitchen increased velocity... possibly from serpentine vessel  . Ejection fraction     50-55%, no wall motion abnormalities, echo, November, 2010  . Edema   . Hemothorax on right   . Breast cancer 2011    Dr Humphrey Rolls- right breast    MEDICATIONS:  Prior to Admission medications   Medication Sig Start Date End Date Taking? Authorizing Provider  anastrozole (ARIMIDEX) 1 MG tablet TAKE 1 TABLET BY MOUTH EVERY DAY 06/12/13   Lew Dawes V, MD  conjugated estrogens (PREMARIN) vaginal cream Place 0.5 Applicatorfuls vaginally 2 (two) times a week. 04/10/11   Aleksei Plotnikov V, MD  [facility] 25 MG tablet TAKE ONE CAPSULE BY MOUTH EVERY 6 HOURS AS NEEDED    Aleksei Plotnikov V, MD  cyanocobalamin (,VITAMIN B-12,) 1000 MCG/ML injection Vitamin B-12 injections 1000 micrograms IM once weekly times 4 doses 09/11/13   Wilmon Arms, MD  divalproex (DEPAKOTE) 500 MG DR tablet Take 1 tablet (500 mg total) by mouth 3 (three) times daily. 08/14/13   Aleksei Plotnikov V, MD  FLUoxetine (PROZAC) 10 MG capsule TAKE 1 CAPSULE BY MOUTH DAILY    Aleksei Plotnikov V, MD  indapamide (LOZOL) 2.5 MG tablet Take 1 tablet (2.5 mg total) by mouth  daily as needed. Only takes if blood pressure is high 02/03/13   Aleksei Plotnikov V, MD  LORazepam (ATIVAN) 1 MG tablet TAKE 1 TABLET BY MOUTH EVERY 8 HOURS AS NEEDED FOR ANXIETY 10/07/13   Aleksei Plotnikov V, MD  methadone (DOLOPHINE) 10 MG tablet Take 1 tablet (10 mg total) by mouth 5 (five) times daily as needed for severe pain. Fill on or after 12/29/13. Once every 30 days 10/07/13   Lew Dawes V, MD  metoprolol succinate (TOPROL-XL) 25 MG 24 hr tablet Take 1 tablet (25 mg total) by mouth daily. 02/03/13   Aleksei Plotnikov V, MD  omeprazole (PRILOSEC) 20 MG  capsule Take 1 capsule (20 mg total) by mouth 2 (two) times daily. 02/03/13   Aleksei Plotnikov V, MD  potassium chloride (KLOR-CON M10) 10 MEQ tablet Take 1 tablet (10 mEq total) by mouth 2 (two) times daily. 10/01/12   Aleksei Plotnikov V, MD  promethazine (PHENERGAN) 25 MG tablet Take 1 tablet (25 mg total) by mouth every 8 (eight) hours as needed for nausea. 02/03/13   Aleksei Plotnikov V, MD  risperiDONE (RISPERDAL) 1 MG tablet Take 1 tablet (1 mg total) by mouth 2 (two) times daily. 10/07/13   Aleksei Plotnikov V, MD  tamsulosin (FLOMAX) 0.4 MG CAPS Take 1 capsule (0.4 mg total) by mouth daily. 12/02/12   Aleksei Plotnikov V, MD  trimethoprim (TRIMPEX) 100 MG tablet Take 1 tablet (100 mg total) by mouth daily. 04/07/13   Aleksei Plotnikov V, MD  Vitamin D, Ergocalciferol, (DRISDOL) 50000 UNITS CAPS Take 1 capsule (50,000 Units total) by mouth every 30 (thirty) days. 09/06/11   Aleksei Plotnikov V, MD  zolpidem (AMBIEN) 10 MG tablet Take 1 tablet (10 mg total) by mouth at bedtime as needed for sleep. 03/16/13   Cassandria Anger, MD    ALLERGIES:  Allergies  Allergen Reactions  . Baclofen     REACTION: confusion  . Belladonna   . Ciprofloxacin   . Doxycycline   . Erythromycin   . Gabapentin   . Iohexol      Code: RASH   . Metoclopramide     Clonic spasms  . Metronidazole     REACTION: rash  . Nalbuphine   . Nitrofurantoin     REACTION: nausea  . Penicillins   . Procaine Hcl   . Sucralfate     REACTION: nausea  . Sulfonamide Derivatives   . Methenamine Rash    SOCIAL HISTORY:  History  Substance Use Topics  . Smoking status: Never Smoker   . Smokeless tobacco: Not on file  . Alcohol Use: No    FAMILY HISTORY: Family History  Problem Relation Age of Onset  . Coronary artery disease      Female 1st degree relative <60  . Colon cancer Mother   . Arthritis Mother   . Heart disease Mother   . Heart disease Father     EXAM: There were no vitals taken for this  visit. CONSTITUTIONAL: Alert and oriented to person and place and responds appropriately to questions only intermittently but will answer some questions inappropriately and began talking about different subjects, most of her conversation is focused on her abdominal pain that is chronic. Well-appearing; well-nourished, elderly, in no apparent distress, pleasant HEAD: Normocephalic EYES: Conjunctivae clear, PERRL ENT: normal nose; no rhinorrhea; moist mucous membranes; pharynx without lesions noted NECK: Supple, no meningismus, no LAD  CARD: RRR; S1 and S2 appreciated; no murmurs, no clicks, no rubs, no gallops RESP:  Normal chest excursion without splinting or tachypnea; breath sounds clear and equal bilaterally; no wheezes, no rhonchi, no rales, no respiratory distress or hypoxia ABD/GI: Normal bowel sounds; non-distended; soft, non-tender, no rebound, no guarding BACK:  The back appears normal and is non-tender to palpation, there is no CVA tenderness EXT: Normal ROM in all joints; non-tender to palpation; no edema; normal capillary refill; no cyanosis    SKIN: Normal color for age and race; warm NEURO: Moves all extremities equally, no facial droop or slurred speech, no pronator drift, unable to complete a full neuro exam as patient is unable to answer questions about sensation or follow commands without becoming distracted and is difficult to redirect PSYCH: Patient denies SI or HI. No history of hallucinations. She has GI fixation which is chronic. Grooming and personal hygiene are appropriate.  MEDICAL DECISION MAKING: Patient here in the emergency department with worsening altered mental status. She was seen by her PCP yesterday for the same. Reviewing his note it appears there is concern that this is due to depression with psychosis, dementia and medication overuse. She is on Benadryl, Ativan, methadone, Phenergan and Ambien at home.  Discussed with family that we will obtain labs, urine, chest  x-ray and head CT to rule out organic causes including stroke, infection, anemia, electrolyte abnormality, MI, dehydration that may be causing her symptoms. If workup is unremarkable, family states they're worried for patient safety and do not feel that they are safe to take her home where she is living with her husband and daughter. Psychiatry may need to be involved for medication adjustment versus possibility of inpatient treatment.  ED PROGRESS: Patient appears to have a nitrite positive urinary tract infection. Culture is pending. Her labs are otherwise unremarkable. Troponin negative. Her Depakote level is subtherapeutic. Patient did have a 6 run beat of V. tach seen on the monitor. Her cardiologist is Dr. Daryel November. Will consult cardiology to see her in the emergency department. She is hemodynamically stable not complaining of chest pain or shortness of breath. Her PCP is with [facility].  We'll admit to hospitalist service. Patient will likely need nursing home placement as the family feels they cannot care for her in her current condition after her inpatient stay.     EKG Interpretation  Date/Time:  Thursday October 08 2013 07:20:01 EDT Ventricular Rate:  72 PR Interval:  170 QRS Duration: 101 QT Interval:  414 QTC Calculation: 453 R Axis:   -4 Text Interpretation:  Sinus rhythm Left ventricular hypertrophy Inferior infarct, age indeterminate Lateral leads are also involved Confirmed by Jewell Haught,  DO, Leea Rambeau [phone]) on 10/08/2013 7:44:29 AM        [place], DO 10/08/13 1018

## 2013-10-09 ENCOUNTER — Inpatient Hospital Stay (HOSPITAL_COMMUNITY): Payer: Medicare Other

## 2013-10-09 DIAGNOSIS — R4182 Altered mental status, unspecified: Secondary | ICD-10-CM | POA: Diagnosis not present

## 2013-10-09 DIAGNOSIS — I059 Rheumatic mitral valve disease, unspecified: Secondary | ICD-10-CM

## 2013-10-09 DIAGNOSIS — E538 Deficiency of other specified B group vitamins: Secondary | ICD-10-CM

## 2013-10-09 LAB — COMPREHENSIVE METABOLIC PANEL
ALT: 16 U/L (ref 0–35)
AST: 27 U/L (ref 0–37)
Albumin: 3.3 g/dL — ABNORMAL LOW (ref 3.5–5.2)
Alkaline Phosphatase: 161 U/L — ABNORMAL HIGH (ref 39–117)
BUN: 13 mg/dL (ref 6–23)
CALCIUM: 9.3 mg/dL (ref 8.4–10.5)
CO2: 24 mEq/L (ref 19–32)
Chloride: 104 mEq/L (ref 96–112)
Creatinine, Ser: 1.1 mg/dL (ref 0.50–1.10)
GFR calc non Af Amer: 45 mL/min — ABNORMAL LOW (ref >90)
GFR, EST AFRICAN AMERICAN: 52 mL/min — AB (ref >90)
Glucose, Bld: 103 mg/dL — ABNORMAL HIGH (ref 70–99)
Potassium: 3.7 mEq/L (ref 3.7–5.3)
SODIUM: 141 meq/L (ref 137–147)
Total Bilirubin: 0.4 mg/dL (ref 0.3–1.2)
Total Protein: 6.6 g/dL (ref 6.0–8.3)

## 2013-10-09 LAB — POCT I-STAT 3, ART BLOOD GAS (G3+)
Acid-Base Excess: 3 mmol/L — ABNORMAL HIGH (ref 0.0–2.0)
Bicarbonate: 24.4 mEq/L — ABNORMAL HIGH (ref 20.0–24.0)
O2 Saturation: 99 %
TCO2: 25 mmol/L (ref 0–100)
pCO2 arterial: 26.5 mmHg — ABNORMAL LOW (ref 35.0–45.0)
pH, Arterial: 7.573 — ABNORMAL HIGH (ref 7.350–7.450)
pO2, Arterial: 115 mmHg — ABNORMAL HIGH (ref 80.0–100.0)

## 2013-10-09 LAB — GLUCOSE, CAPILLARY: GLUCOSE-CAPILLARY: 98 mg/dL (ref 70–99)

## 2013-10-09 LAB — PROLACTIN: PROLACTIN: 57.8 ng/mL

## 2013-10-09 LAB — AMMONIA: Ammonia: 35 umol/L (ref 11–60)

## 2013-10-09 LAB — CBC
HEMATOCRIT: 32.2 % — AB (ref 36.0–46.0)
Hemoglobin: 10.6 g/dL — ABNORMAL LOW (ref 12.0–15.0)
MCH: 31 pg (ref 26.0–34.0)
MCHC: 32.9 g/dL (ref 30.0–36.0)
MCV: 94.2 fL (ref 78.0–100.0)
Platelets: 172 10*3/uL (ref 150–400)
RBC: 3.42 MIL/uL — AB (ref 3.87–5.11)
RDW: 13.5 % (ref 11.5–15.5)
WBC: 6.1 10*3/uL (ref 4.0–10.5)

## 2013-10-09 LAB — FOLATE RBC: RBC FOLATE: 473 ng/mL (ref >280)

## 2013-10-09 MED ORDER — DSS 100 MG PO CAPS: 100.0000 mg | ORAL_CAPSULE | Freq: Two times a day (BID) | ORAL | Status: AC

## 2013-10-09 MED ORDER — ASPIRIN 81 MG PO TBEC: 81.0000 mg | DELAYED_RELEASE_TABLET | Freq: Every day | ORAL | Status: AC

## 2013-10-09 MED ORDER — VITAMIN B-12 1000 MCG PO TABS
1000.0000 ug | ORAL_TABLET | Freq: Every day | ORAL | Status: DC
  Administered 2013-10-09 – 2013-10-10 (×2): 1000 ug via ORAL
  Filled 2013-10-09 (×2): qty 1

## 2013-10-09 MED ORDER — INDAPAMIDE 2.5 MG PO TABS: 2.5000 mg | ORAL_TABLET | Freq: Every day | ORAL | Status: AC

## 2013-10-09 NOTE — Progress Notes (Addendum)
Called at 0455 by Delaney Meigs, RN  that patient is unarousable. Had fall onto floor at 0200 but apparently did not hit head per daughter. On arrival to room pt responding to sternal rub, Pupils pinpoint with right slightly >left. Follows commands to squeeze hands.  Bilateral lower extremities weak but able to hold for 5 seconds off the bed.  Speech extremely garbled , tongue very thick with ? Extrapyramidal effects of tongue thrusting and lips twitching.  Pt received Ambien 5 mg at 2125,  Respirdal 1 mg at 2124,  Ativan Img IV at 1523  and  Haldol 2.5 mg IV at 1550 10/08/13.  VS: 99.2 oral  145/76  68 18 93% RA sats  CBG 98   Spoke with Forrest Moron, NP at 0510  and orders received for CT head.  Pt taken to radiology at [facility].  No acute findings.  Returned pt to room much more active and responsive. Handoff report given to Marga Melnick, RN with instructions for close observation, bed alarm.  Will follow as needed

## 2013-10-09 NOTE — Progress Notes (Addendum)
[facility] PROGRESS NOTE  [name] YWV:[id number] DOB: [birth year] DOA: 10/08/2013 PCP: Walker Kehr, MD  Assessment/Plan  Acute encephalopathy likely likely due to UTI superimposed on underlying dementia and psychotic tendencies.  Episode of obtundation overnight which briefly improved, but patient again lethargic/obtunded -  MRI brain to exclude acute stroke -  TSH 2.6, RPR NR, B12 380 -  Start B12 supplementation -  Folate pending -  LFTs wnl, ammonia 26 -  Tx UTI -  ABG  Fall without head trauma -  Head CT wnl  Urinary tract infection -  Continue ceftriaxone  -  F/u urine culture  Nonsustained VT: Patient remained asymptomatic. EKG does not show any clear-cut ischemic changes.  -  troponings neg -  Potassium and magnesium wnl -  ECHO pending -  BB increased by cardilogy -  Telemetry:  PVC, NSR   Psychosis/depression: -  Monitor for behavioral issues -  Continue with her home medications including Depakote, Risperdal, fluoxetine -  Stop haldol as this may be contributing to extrapyramidal symptoms  Breast cancer, stable, continue Arimidex.   CAD/HTN, blood pressures elevated -  continue ASA, BB -  Consider addition of ACEI or norvasc -  Lipid panel  Chronic pain, stable, continue methadone  Normocytic anemia, chronic stable and likely secondary to chronic disease.    Diet:  Healthy heart Access:  PIV IVF:  Off  Proph:  heparin  Code Status: full  Family Communication: spoke with husband via [place] interpreter.  Patient lethargic Disposition Plan: pending results of urine culture and MRI, possibly to SNF tomorrow if mentation improved.     Consultants:  Cardiology  Procedures:  CT head  CT head   CXR  MRI brain  ECHO  Antibiotics:  Ceftriaxone 6/4 >>   HPI/Subjective:  Lethargic, unable to respond to questions    Objective: Filed Vitals:   10/08/13 2047 10/09/13 0200 10/09/13 0400 10/09/13 0452  BP: 154/62 157/82  169/83 145/73  Pulse: 70 87 67 68  Temp: 97.7 F (36.5 C) 98.6 F (37 C) 98.4 F (36.9 C) 99.2 F (37.3 C)  TempSrc: Oral Oral Oral Oral  Resp: 20 20 18    Height:      Weight:      SpO2: 93% 95% 92% 93%    Intake/Output Summary (Last 24 hours) at 10/09/13 8546 Last data filed at 10/08/13 2125  Gross per 24 hour  Intake  680.5 ml  Output      0 ml  Net  680.5 ml   Filed Weights   10/08/13 0720  Weight: 72.576 kg (160 lb)    Exam:   General:  Adult female, No acute distress, asleep and difficult to arouse.  Opened eyes and spoke a word briefly to sternal rub then fell back asleep  HEENT:  NCAT, MMM  Cardiovascular:  RRR, nl S1, S2 no mrg, 2+ pulses, warm extremities  Respiratory:  CTAB, no increased WOB  Abdomen:   NABS, soft, NT/ND  MSK:   Normal tone and bulk, no LEE  Neuro:  Grossly intact  Data Reviewed: Basic Metabolic Panel:  Recent Labs Lab 10/08/13 0905 10/08/13 1500 10/09/13 0554  NA 140  --  141  K 4.7  --  3.7  CL 103  --  104  CO2 21  --  24  GLUCOSE 97  --  103*  BUN 20  --  13  CREATININE 1.22*  --  1.10  CALCIUM 9.4  --  9.3  MG  --  1.9  --    Liver Function Tests:  Recent Labs Lab 10/08/13 1500 10/09/13 0554  AST 30 27  ALT 16 16  ALKPHOS 162* 161*  BILITOT 0.3 0.4  PROT 6.8 6.6  ALBUMIN 3.4* 3.3*   No results found for this basename: LIPASE, AMYLASE,  in the last 168 hours  Recent Labs Lab 10/09/13 0554  AMMONIA 35   CBC:  Recent Labs Lab 10/08/13 0905 10/09/13 0554  WBC 7.0 6.1  NEUTROABS 4.4  --   HGB 10.2* 10.6*  HCT 30.8* 32.2*  MCV 94.8 94.2  PLT 167 172   Cardiac Enzymes:  Recent Labs Lab 10/08/13 0845 10/08/13 1500 10/08/13 2205  TROPONINI <0.30 <0.30 <0.30   BNP (last 3 results) No results found for this basename: PROBNP,  in the last 8760 hours CBG:  Recent Labs Lab 10/09/13 0455  GLUCAP 98    Recent Results (from the past 240 hour(s))  URINE CULTURE     Status: None    Collection Time    10/08/13  9:01 AM      Result Value Ref Range Status   Specimen Description URINE, RANDOM   Final   Special Requests NONE   Final   Culture  Setup Time     Final   Value: 10/08/2013 10:15     Performed at [facility]     Final   Value: >=100,000 COLONIES/ML     Performed at [facility]   Culture     Final   Value: [place]     Performed at [facility]   Report Status PENDING   Incomplete     Studies: Dg Chest 2 View  10/08/2013   CLINICAL DATA:  Altered mental status.  EXAM: CHEST  2 VIEW  COMPARISON:  PA and lateral chest of January 21, 2013  FINDINGS: The lungs are better inflated today. There is new linear density peripherally in the right midlung and there is blunting of the costophrenic angles bilaterally. The cardiac silhouette is top-normal in size. The pulmonary vascularity is normal. There is tortuosity of the descending thoracic aorta. The patient has undergone previous CABG. There are degenerative changes of both shoulders.  IMPRESSION: Small amounts of pleural fluid are present at the lung bases which may reflect low-grade compensated CHF. No significant interstitial or alveolar edema is demonstrated. Density in the right midlung likely reflects subsegmental atelectasis.   Electronically Signed   By: David  Martinique   On: 10/08/2013 08:11   Ct Head Wo Contrast  10/09/2013   CLINICAL DATA:  Change in mental status  EXAM: CT HEAD WITHOUT CONTRAST  TECHNIQUE: Contiguous axial images were obtained from the base of the skull through the vertex without intravenous contrast.  COMPARISON:  10/08/2013  FINDINGS: Skull and Sinuses:Negative for fracture or destructive process. The sinuses, mastoids, and middle ear cavities are aerated.  Orbits: Bilateral cataract resection.  Brain: No evidence of acute abnormality, such as acute infarction, hemorrhage, hydrocephalus, or mass lesion/mass effect. There is generalized brain  atrophy, mildly age advanced. Moderate chronic small vessel disease is present, with ischemic gliosis mainly in the periventricular and deep cerebral white matter. Remote appearing, small left occipital cortical infarct .  IMPRESSION: 1. No acute intracranial findings. 2. Brain atrophy and remote ischemic injury, described above.   Electronically Signed   By: Jorje Guild M.D.   On: 10/09/2013 05:37   Ct Head Wo Contrast  10/08/2013   CLINICAL DATA:  ALTERED MENTAL STATUS  EXAM: CT HEAD WITHOUT CONTRAST  TECHNIQUE: Contiguous axial images were obtained from the base of the skull through the vertex without intravenous contrast.  COMPARISON:  12/25/2010  FINDINGS: Chronic left posterior parietal encephalomalacia. Diffuse parenchymal atrophy. Patchy areas of hypoattenuation in deep and periventricular white matter bilaterally. Negative for acute intracranial hemorrhage, mass lesion, acute infarction, midline shift, or mass-effect. Acute infarct may be inapparent on noncontrast CT. Ventricles and sulci symmetric. Bone windows demonstrate no focal lesion.  IMPRESSION: 1. Negative for bleed or other acute intracranial process. 2. Atrophy and nonspecific white matter changes as above.   Electronically Signed   By: Arne Cleveland M.D.   On: 10/08/2013 08:29    Scheduled Meds: . anastrozole  1 mg Oral Daily  . aspirin EC  81 mg Oral Daily  . cefTRIAXone (ROCEPHIN)  IV  1 g Intravenous Q24H  . [START ON 10/19/2013] conjugated estrogens  1 g Vaginal Once per day on Mon Thu  . divalproex  500 mg Oral TID  . docusate sodium  100 mg Oral BID  . FLUoxetine  10 mg Oral Daily  . heparin  5,000 Units Subcutaneous 3 times per day  . metoprolol succinate  50 mg Oral Q24H  . pantoprazole  40 mg Oral BID  . risperiDONE  1 mg Oral BID  . sodium chloride  3 mL Intravenous Q12H  . tamsulosin  0.4 mg Oral Daily  . thiamine  100 mg Oral Daily   Continuous Infusions:   Principal Problem:   Acute  encephalopathy Active Problems:   PSYCHOSIS   DEPRESSION   BREAST CANCER, HX OF   CAD (coronary artery disease)   UTI (urinary tract infection)   NSVT (nonsustained ventricular tachycardia)    Time spent: 30 min    [facility] Pager [phone]. If 7PM-7AM, please contact night-coverage at [email or web address], password [facility] 10/09/2013, 9:23 AM  LOS: 1 day

## 2013-10-09 NOTE — Evaluation (Signed)
Occupational Therapy Evaluation Patient Details Name: [name] MRN: [id number] DOB: 1927/07/05 Today's Date: 10/09/2013    History of Present Illness Patient is an 80 yo female admitted 10/08/13 with AMS with UTI.  Patient with h/o CAD, depression, psychosis.  Early am, patient had fall with daughter assisting patient to bathroom.  Patient received meds for agitation.  Today patient minimally responsive to pain only.   Clinical Impression   Pt admitted with above. She demonstrates the below listed deficits and will benefit from continued OT to maximize safety and independence with BADLs.  Pt obtunded during eval.  Sat pt EOB with no increase in arousal.  Pt with minimal response to painful stimuli only.  At this time she is total A with all BADLs and mobility due to decreased arousal and inability to participate.  Pt is scheduled for transfer to SNF so therefore, OT goals not established.  IF pt does not discharge and still here next week, will then establish goals and treatment plan.      Follow Up Recommendations  SNF    Equipment Recommendations  None recommended by OT    Recommendations for Other Services       Precautions / Restrictions Precautions Precautions: Fall Restrictions Weight Bearing Restrictions: No      Mobility Bed Mobility Overal bed mobility: Needs Assistance;+2 for physical assistance Bed Mobility: Rolling;Sidelying to Sit;Sit to Supine Rolling: Total assist;+2 for physical assistance Sidelying to sit: Total assist;+2 for physical assistance   Sit to supine: Total assist;+2 for physical assistance   General bed mobility comments: Moved patient to sitting position at EOB with +2 total assist.  Patient unable to physically assist.  Required total assist to maintain balance and head control.  Provided stimulus - verbal, tactile, cold, painful (clavicle pinch).  Responded minimally to painful stimulus only.  Returned to supine > sidelying with +2 total  assist.  Transfers                 General transfer comment: Pt with decreased arousal - unable     Balance Overall balance assessment: Needs assistance Sitting-balance support: Feet supported Sitting balance-Leahy Scale: Zero Sitting balance - Comments: Patient required +2 total assist to maintain sitting balance and head control.                                    ADL Overall ADL's : Needs assistance/impaired Eating/Feeding: Total assistance Eating/Feeding Details (indicate cue type and reason): Pt with decreased arousal - unable  Grooming: Wash/dry hands;Wash/dry face;Oral care;Applying deodorant;Brushing hair;Total assistance Grooming Details (indicate cue type and reason): Pt with decreased arousal - unable  Upper Body Bathing: Total assistance Upper Body Bathing Details (indicate cue type and reason): Pt with decreased arousal - unable  Lower Body Bathing: Total assistance Lower Body Bathing Details (indicate cue type and reason): Pt with decreased arousal - unable  Upper Body Dressing : Total assistance Upper Body Dressing Details (indicate cue type and reason): Pt with decreased arousal - unable  Lower Body Dressing: Total assistance Lower Body Dressing Details (indicate cue type and reason): Pt with decreased arousal - unable  Toilet Transfer: Total assistance Toilet Transfer Details (indicate cue type and reason): Pt with decreased arousal - unable  Toileting- Clothing Manipulation and Hygiene: Total assistance Toileting - Clothing Manipulation Details (indicate cue type and reason): Pt with decreased arousal - unable      Functional mobility  during ADLs: +2 for physical assistance;Total assistance       Vision                 Additional Comments: Pt with decreased arousal - unable    Perception Perception Comments: Pt with decreased arousal - unable    Praxis Praxis Praxis-Other Comments: Pt with decreased arousal - unable      Pertinent Vitals/Pain No indication of pain      Hand Dominance     Extremity/Trunk Assessment Upper Extremity Assessment Upper Extremity Assessment: RUE deficits/detail;LUE deficits/detail;Difficult to assess due to impaired cognition RUE Deficits / Details: PROM WFL.  Pt with occasional active movement, but not consistent LUE Deficits / Details: PROM WFL.  Pt with occasional active movement, but not consistent   Lower Extremity Assessment Lower Extremity Assessment: Defer to PT evaluation   Cervical / Trunk Assessment Cervical / Trunk Assessment: Other exceptions Cervical / Trunk Exceptions: Pt maintains head and trunk in flexion    Communication Communication Communication: Receptive difficulties;Expressive difficulties (Obtunded )   Cognition Arousal/Alertness: Lethargic Behavior During Therapy:  (Responsive only to painful stimulus) Overall Cognitive Status: Difficult to assess                     General Comments       Exercises       Shoulder Instructions      Home Living Family/patient expects to be discharged to:: Skilled nursing facility Living Arrangements: Spouse/significant other                                      Prior Functioning/Environment Level of Independence: Independent        Comments: Per chart - patient unable to respond to questions.    OT Diagnosis: Generalized weakness;Cognitive deficits   OT Problem List: Decreased strength;Decreased activity tolerance;Decreased cognition;Decreased knowledge of use of DME or AE   OT Treatment/Interventions:      OT Goals(Current goals can be found in the care plan section) Acute Rehab OT Goals OT Goal Formulation: Patient unable to participate in goal setting  OT Frequency:     Barriers to D/C:            Co-evaluation PT/OT/SLP Co-Evaluation/Treatment: Yes Reason for Co-Treatment: Complexity of the patient's impairments (multi-system involvement);For  patient/therapist safety   OT goals addressed during session: Strengthening/ROM      End of Session Nurse Communication: Other (comment) (eval findings)  Activity Tolerance: Patient limited by lethargy Patient left: in bed;with call bell/phone within reach;with bed alarm set;with family/visitor present   Time: 6759-1638 OT Time Calculation (min): 11 min Charges:  OT General Charges $OT Visit: 1 Procedure OT Evaluation $Initial OT Evaluation Tier I: 1 Procedure G-Codes:    Ellard Artis M Cayleigh Paull 05-Nov-2013, 2:55 PM

## 2013-10-09 NOTE — Progress Notes (Signed)
OT Cancellation Note  Patient Details Name: [name] MRN: [id number] DOB: Feb 20, 1928   Cancelled Treatment:    Reason Eval/Treat Not Completed: Patient at procedure or test/ unavailable - will reattempt  [place], OTR/L [phone] 10/09/2013, 11:46 AM

## 2013-10-09 NOTE — Progress Notes (Signed)
EEG Completed; Results Pending  

## 2013-10-09 NOTE — Discharge Summary (Signed)
Physician Discharge Summary  [name] DGL:[id number] DOB: Sep 29, 1927 DOA: 10/08/2013  PCP: Walker Kehr, MD  Admit date: 10/08/2013 Discharge date: 10/09/2013  Recommendations for Outpatient Follow-up:  1. Transfer to SNF for ongoing PT/OT  2. Continue antibiotics for urinary tract infection until all tabs are gone  Discharge Diagnoses:  Principal Problem:   Acute encephalopathy Active Problems:   PSYCHOSIS   DEPRESSION   BREAST CANCER, HX OF   CAD (coronary artery disease)   UTI (urinary tract infection)   NSVT (nonsustained ventricular tachycardia)   Discharge Condition: stable, improved   Diet recommendation: healthy heart  Wt Readings from Last 3 Encounters:  10/08/13 72.576 kg (160 lb)  10/07/13 70.761 kg (156 lb)  09/11/13 72.984 kg (160 lb 14.4 oz)    History of present illness:  [name] is a 80 y.o. female with a past medical history of coronary artery disease, breast cancer, depression, psychosis who lives with her husband. Her daughter also cares for her. Patient doesn't speak any [place]. Daughter was at the bedside, who interpreted. Apparently, for the last 3 days patient has not been acting normally. She's had some weakness and poor balance. She has been speaking without making much sense. She hasn't slept in 3 days. And all this got worse yesterday. She was taken for a routine visit to her primary care physician. The PCP thought that she was having paranoid symptoms. The daughter also tells me that for the past 6 months or so patient's memory has been getting worse. She's had on and off symptoms of confusion. More recently denies any history of fever, chills. Had couple episodes of vomiting 2 days ago, but none recently. No diarrhea, but she has been constipated. Since her symptoms were getting worse she was brought into the hospital. So history is limited due to language barrier and cognitive impairment.  Hospital Course:   Acute  encephalopathy likely due to UTI superimposed on underlying dementia and psychotic tendencies. Episode of obtundation overnight which briefly improved, but patient again lethargic/obtunded.   - MRI brain to exclude acute stroke is pending - TSH 2.6, RPR NR, B12 380  - Start B12 supplementation  - Folate pending  - LFTs wnl, ammonia 26  - Tx UTI  - ABG consistent with respiratory alkalosis, no hypercapenia  Fall without head trauma  - Head CT wnl   Urinary tract infection  - Continue ceftriaxone  - F/u urine culture  -  Will transition to oral antibiotic prior to discharge based on culture sensitivities  Nonsustained VT: Patient remained asymptomatic. EKG does not show any clear-cut ischemic changes.  - troponings neg  - Potassium and magnesium wnl  - ECHO pending  - BB increased by cardilogy  - Telemetry: PVC, NSR   Psychosis/depression: she frequently has transient behavioral disturbance when she has UTIs.  She has been compliant with her medications.  Continued her home medications including Depakote, Risperdal, fluoxetine  - Haldol may have contributed to extrapyramidal symptoms with tongue thrusting so this prn medication was discontinued  Breast cancer, stable, continued Arimidex.   CAD/HTN, blood pressures elevated.  Continued ASA, BB and may consider adding ACEI or norvasc if her blood pressure remains elevated.   - Lipid panel pending   Chronic pain, stable, continue methadone   Normocytic anemia, chronic stable and likely secondary to chronic disease.    Consultants:  Cardiology Procedures:  CT head  CT head  CXR  MRI brain  ECHO Antibiotics:  Ceftriaxone 6/4 >>  Discharge Exam: Filed Vitals:   10/09/13 0452  BP: 145/73  Pulse: 68  Temp: 99.2 F (37.3 C)  Resp:    Filed Vitals:   10/08/13 2047 10/09/13 0200 10/09/13 0400 10/09/13 0452  BP: 154/62 157/82 169/83 145/73  Pulse: 70 87 67 68  Temp: 97.7 F (36.5 C) 98.6 F (37 C) 98.4 F (36.9 C)  99.2 F (37.3 C)  TempSrc: Oral Oral Oral Oral  Resp: 20 20 18    Height:      Weight:      SpO2: 93% 95% 92% 93%    General: Adult female, No acute distress, asleep and difficult to arouse. Opened eyes and spoke a word briefly to sternal rub then fell back asleep  HEENT: NCAT, MMM  Cardiovascular: RRR, nl S1, S2 no mrg, 2+ pulses, warm extremities  Respiratory: CTAB, no increased WOB  Abdomen: NABS, soft, NT/ND  MSK: Normal tone and bulk, no LEE  Neuro: Grossly intact   Discharge Instructions     Medication List    STOP taking these medications       trimethoprim 100 MG tablet  Commonly known as:  TRIMPEX      TAKE these medications       anastrozole 1 MG tablet  Commonly known as:  ARIMIDEX  Take 1 mg by mouth daily.     aspirin 81 MG EC tablet  Take 1 tablet (81 mg total) by mouth daily.     conjugated estrogens vaginal cream  Commonly known as:  PREMARIN  Place 0.5 Applicatorfuls vaginally 2 (two) times a week.     diphenhydrAMINE 25 mg capsule  Commonly known as:  BENADRYL  Take 25 mg by mouth every 6 (six) hours as needed.     divalproex 500 MG DR tablet  Commonly known as:  DEPAKOTE  Take 1 tablet (500 mg total) by mouth 3 (three) times daily.     DSS 100 MG Caps  Take 100 mg by mouth 2 (two) times daily.     FLUoxetine 10 MG capsule  Commonly known as:  PROZAC  Take 10 mg by mouth daily.     indapamide 2.5 MG tablet  Commonly known as:  LOZOL  Take 1 tablet (2.5 mg total) by mouth daily.     LORazepam 1 MG tablet  Commonly known as:  ATIVAN  Take 1 mg by mouth every 8 (eight) hours as needed for anxiety.     methadone 10 MG tablet  Commonly known as:  DOLOPHINE  Take 1 tablet (10 mg total) by mouth 5 (five) times daily as needed for severe pain. Fill on or after 12/29/13. Once every 30 days     metoprolol succinate 25 MG 24 hr tablet  Commonly known as:  TOPROL-XL  Take 1 tablet (25 mg total) by mouth daily.     omeprazole 20 MG capsule   Commonly known as:  PRILOSEC  Take 1 capsule (20 mg total) by mouth 2 (two) times daily.     promethazine 25 MG tablet  Commonly known as:  PHENERGAN  Take 1 tablet (25 mg total) by mouth every 8 (eight) hours as needed for nausea.     risperiDONE 1 MG tablet  Commonly known as:  RISPERDAL  Take 1 tablet (1 mg total) by mouth 2 (two) times daily.     tamsulosin 0.4 MG Caps capsule  Commonly known as:  FLOMAX  Take 1 capsule (0.4 mg total) by mouth daily.  Vitamin D (Ergocalciferol) 50000 UNITS Caps capsule  Commonly known as:  DRISDOL  Take 1 capsule (50,000 Units total) by mouth every 30 (thirty) days.     zolpidem 10 MG tablet  Commonly known as:  AMBIEN  Take 1 tablet (10 mg total) by mouth at bedtime as needed for sleep.       Follow-up Information   Follow up with Walker Kehr, MD. (As needed)    Specialty:  Internal Medicine   Contact information:   [place] [address] [phone]       Follow up with Darlin Coco, MD. Schedule an appointment as soon as possible for a visit in 1 month.   Specialty:  Cardiology   Contact information:   [place] [address] [address] [phone]        The results of significant diagnostics from this hospitalization (including imaging, microbiology, ancillary and laboratory) are listed below for reference.    Significant Diagnostic Studies: Dg Chest 2 View  10/08/2013   CLINICAL DATA:  Altered mental status.  EXAM: CHEST  2 VIEW  COMPARISON:  PA and lateral chest of January 21, 2013  FINDINGS: The lungs are better inflated today. There is new linear density peripherally in the right midlung and there is blunting of the costophrenic angles bilaterally. The cardiac silhouette is top-normal in size. The pulmonary vascularity is normal. There is tortuosity of the descending thoracic aorta. The patient has undergone previous CABG. There are degenerative changes of both shoulders.   IMPRESSION: Small amounts of pleural fluid are present at the lung bases which may reflect low-grade compensated CHF. No significant interstitial or alveolar edema is demonstrated. Density in the right midlung likely reflects subsegmental atelectasis.   Electronically Signed   By: David  Martinique   On: 10/08/2013 08:11   Ct Head Wo Contrast  10/09/2013   CLINICAL DATA:  Change in mental status  EXAM: CT HEAD WITHOUT CONTRAST  TECHNIQUE: Contiguous axial images were obtained from the base of the skull through the vertex without intravenous contrast.  COMPARISON:  10/08/2013  FINDINGS: Skull and Sinuses:Negative for fracture or destructive process. The sinuses, mastoids, and middle ear cavities are aerated.  Orbits: Bilateral cataract resection.  Brain: No evidence of acute abnormality, such as acute infarction, hemorrhage, hydrocephalus, or mass lesion/mass effect. There is generalized brain atrophy, mildly age advanced. Moderate chronic small vessel disease is present, with ischemic gliosis mainly in the periventricular and deep cerebral white matter. Remote appearing, small left occipital cortical infarct .  IMPRESSION: 1. No acute intracranial findings. 2. Brain atrophy and remote ischemic injury, described above.   Electronically Signed   By: Jorje Guild M.D.   On: 10/09/2013 05:37   Ct Head Wo Contrast  10/08/2013   CLINICAL DATA:  ALTERED MENTAL STATUS  EXAM: CT HEAD WITHOUT CONTRAST  TECHNIQUE: Contiguous axial images were obtained from the base of the skull through the vertex without intravenous contrast.  COMPARISON:  12/25/2010  FINDINGS: Chronic left posterior parietal encephalomalacia. Diffuse parenchymal atrophy. Patchy areas of hypoattenuation in deep and periventricular white matter bilaterally. Negative for acute intracranial hemorrhage, mass lesion, acute infarction, midline shift, or mass-effect. Acute infarct may be inapparent on noncontrast CT. Ventricles and sulci symmetric. Bone windows  demonstrate no focal lesion.  IMPRESSION: 1. Negative for bleed or other acute intracranial process. 2. Atrophy and nonspecific white matter changes as above.   Electronically Signed   By: Delories Heinz.D.  On: 10/08/2013 08:29   Mm Digital Diagnostic Bilat  09/11/2013   CLINICAL DATA:  History of left breast cancer status post lumpectomy 2011.  EXAM: DIGITAL DIAGNOSTIC  BILATERAL MAMMOGRAM WITH CAD  COMPARISON:  With priors.  ACR Breast Density Category b: There are scattered areas of fibroglandular density.  FINDINGS: Stable lumpectomy changes are seen in the left breast. No suspicious mass or malignant type microcalcifications identified.  Mammographic images were processed with CAD.  IMPRESSION: No evidence of malignancy in either breast.  RECOMMENDATION: Bilateral diagnostic mammogram in 1 year is recommended.  I have discussed the findings and recommendations with the patient. Results were also provided in writing at the conclusion of the visit. If applicable, a reminder letter will be sent to the patient regarding the next appointment.  BI-RADS CATEGORY  2: Benign Finding(s)   Electronically Signed   By: Lillia Mountain M.D.   On: 09/11/2013 11:43    Microbiology: Recent Results (from the past 240 hour(s))  URINE CULTURE     Status: None   Collection Time    10/08/13  9:01 AM      Result Value Ref Range Status   Specimen Description URINE, RANDOM   Final   Special Requests NONE   Final   Culture  Setup Time     Final   Value: 10/08/2013 10:15     Performed at [facility]     Final   Value: >=100,000 COLONIES/ML     Performed at [facility]   Culture     Final   Value: [place]     Performed at [facility]   Report Status PENDING   Incomplete     Labs: Basic Metabolic Panel:  Recent Labs Lab 10/08/13 0905 10/08/13 1500 10/09/13 0554  NA 140  --  141  K 4.7  --  3.7  CL 103  --  104  CO2 21  --  24  GLUCOSE 97  --  103*   BUN 20  --  13  CREATININE 1.22*  --  1.10  CALCIUM 9.4  --  9.3  MG  --  1.9  --    Liver Function Tests:  Recent Labs Lab 10/08/13 1500 10/09/13 0554  AST 30 27  ALT 16 16  ALKPHOS 162* 161*  BILITOT 0.3 0.4  PROT 6.8 6.6  ALBUMIN 3.4* 3.3*   No results found for this basename: LIPASE, AMYLASE,  in the last 168 hours  Recent Labs Lab 10/09/13 0554  AMMONIA 35   CBC:  Recent Labs Lab 10/08/13 0905 10/09/13 0554  WBC 7.0 6.1  NEUTROABS 4.4  --   HGB 10.2* 10.6*  HCT 30.8* 32.2*  MCV 94.8 94.2  PLT 167 172   Cardiac Enzymes:  Recent Labs Lab 10/08/13 0845 10/08/13 1500 10/08/13 2205  TROPONINI <0.30 <0.30 <0.30   BNP: BNP (last 3 results) No results found for this basename: PROBNP,  in the last 8760 hours CBG:  Recent Labs Lab 10/09/13 [place] 98    Time coordinating discharge: 45 minutes  Signed:  Janece Canterbury  [facility] 10/09/2013, 1:33 PM

## 2013-10-09 NOTE — Progress Notes (Signed)
Physical Therapy Evaluation Patient Details Name: [name] MRN: [id number] DOB: 11-30-1927 Today's Date: 10/09/2013   History of Present Illness  Patient is an 79 yo female admitted 10/08/13 with AMS with UTI.  Patient with h/o CAD, depression, psychosis.  Early am, patient had fall with daughter assisting patient to bathroom.  Patient received meds for agitation.  Today patient minimally responsive to pain only.  Clinical Impression  Patient presents with problems listed below.  Patient minimally responsive to pain only today.  Plan is to discharge to SNF.  Therapy needs can be met in SNF venue of care.  (If not discharged over weekend, will establish goals early next week).    Follow Up Recommendations SNF;Supervision/Assistance - 24 hour    Equipment Recommendations  None recommended by PT    Recommendations for Other Services       Precautions / Restrictions Precautions Precautions: Fall Restrictions Weight Bearing Restrictions: No      Mobility  Bed Mobility Overal bed mobility: Needs Assistance;+2 for physical assistance Bed Mobility: Rolling;Sidelying to Sit;Sit to Supine Rolling: Total assist;+2 for physical assistance Sidelying to sit: Total assist;+2 for physical assistance   Sit to supine: Total assist;+2 for physical assistance   General bed mobility comments: Moved patient to sitting position at EOB with +2 total assist.  Patient unable to physically assist.  Required total assist to maintain balance and head control.  Provided stimulus - verbal, tactile, cold, painful (clavicle pinch).  Responded minimally to painful stimulus only.  Returned to supine > sidelying with +2 total assist.  Transfers                    Ambulation/Gait                Stairs            Wheelchair Mobility    Modified Rankin (Stroke Patients Only)       Balance Overall balance assessment: Needs assistance Sitting-balance support: No upper  extremity supported;Feet unsupported Sitting balance-Leahy Scale: Zero Sitting balance - Comments: Patient required +2 total assist to maintain sitting balance and head control.                                     Pertinent Vitals/Pain     Home Living Family/patient expects to be discharged to:: Skilled nursing facility Living Arrangements: Spouse/significant other                    Prior Function Level of Independence: Independent         Comments: Per chart - patient unable to respond to questions.     Hand Dominance        Extremity/Trunk Assessment   Upper Extremity Assessment: Defer to OT evaluation           Lower Extremity Assessment: Difficult to assess due to impaired cognition (ROM WFL.  No active movement noted)         Communication   Communication: Receptive difficulties;Expressive difficulties (Obtunded )  Cognition Arousal/Alertness: Lethargic Behavior During Therapy:  (Responsive only to painful stimulus) Overall Cognitive Status: Difficult to assess                      General Comments      Exercises        Assessment/Plan    PT Assessment All further PT needs  can be met in the next venue of care  PT Diagnosis Difficulty walking;Generalized weakness;Altered mental status   PT Problem List Decreased strength;Decreased activity tolerance;Decreased balance;Decreased mobility;Decreased cognition;Decreased safety awareness  PT Treatment Interventions     PT Goals (Current goals can be found in the Care Plan section)      Frequency     Barriers to discharge        Co-evaluation               End of Session   Activity Tolerance: Patient limited by lethargy Patient left: in bed;with call bell/phone within reach;with bed alarm set           Time: 1359-1413 PT Time Calculation (min): 14 min   Charges:   PT Evaluation $Initial PT Evaluation Tier I: 1 Procedure     PT G Codes:           Despina Pole 10/09/2013, 2:33 PM Carita Pian. Sanjuana Kava, [place] Pager [phone]

## 2013-10-09 NOTE — Progress Notes (Signed)
Pt admitted for altered mental status. Pt had been previously confused and somewhat impulsive. Husband sat with patient during the day to assist due to patient being [place] and speaking no [place]. Patient was calm when daughter arrived to relieve pt husband for the night.  Bed alarm was in place and daughter/patient instructed on how to call for assistance, however on numerous occasions daughter assisted patient to the bedside commode causing bed alarm to go off.  Not evident if bed alarm was in place at the time of incident, another nurse assisted patient to bed a bit earlier in the shift but unsure if it was placed back on.  @0200  Pt's daughter called for assistance to cut off pump alarm, a nurse entered room to find patient on the floor with daughter at her side; when asked what happened the daughter stated " she wanted to sit on the side of the bed and her legs went from under her and then she fell".  Pt assessed, no signs of injury. Pt vitals taken with no c/o of pain. Pt  assisted back to bed. NP K.Kirby notified, no new orders given at this time. Will continue continue to monitor patient.

## 2013-10-09 NOTE — Progress Notes (Signed)
Pt found to be lethargic and hardly responsive to sternal rub, pupils reactive but sluggish and speech garbled. BS 98, B/P 145/73, Pulse 68, O2 93 on room air. Rapid Response called and NP K.Kirby made aware, STAT CT ordered. Will continue to monitor patient.

## 2013-10-09 NOTE — Progress Notes (Addendum)
Clinical Social Work Department CLINICAL SOCIAL WORK PLACEMENT NOTE 10/09/2013  Patient:  [name], [name]  Account Number:  [id number] [place] date:  10/08/2013  Clinical Social Worker:  Adair Laundry  Date/time:  10/09/2013 10:40 AM  Clinical Social Work is seeking post-discharge placement for this patient at the following level of care:   SKILLED NURSING   (*CSW will update this form in Epic as items are completed)   10/09/2013  Patient/family provided with [place] Department of Clinical Social Work's list of facilities offering this level of care within the geographic area requested by the patient (or if unable, by the patient's family).  10/09/2013  Patient/family informed of their freedom to choose among providers that offer the needed level of care, that participate in Medicare, Medicaid or managed care program needed by the patient, have an available bed and are willing to accept the patient.  10/09/2013  Patient/family informed of [facility]' ownership interest in [facility], as well as of the fact that they are under no obligation to receive care at this facility.  PASARR submitted to EDS on EXISTING PASARR number received from EDS on   FL2 transmitted to all facilities in geographic area requested by pt/family on  10/09/2013 FL2 transmitted to all facilities within larger geographic area on   Patient informed that his/her managed care company has contracts with or will negotiate with  certain facilities, including the following:     Patient/family informed of bed offers received:  10/09/2013 Patient chooses bed at [facility] Physician recommends and patient chooses bed at    Patient to be transferred to [facility]  on  10/10/2013-Daleah Coulson [place], [place] Patient to be transferred to facility by husband and daughter  The following physician request were entered in Epic:   Additional CommentsBerton Mount, [place]

## 2013-10-09 NOTE — Progress Notes (Signed)
CSW (Clinical Education officer, museum) received call from [facility] confirming they can accept pt. CSW advised of possible weekend dc. Facility will need dc summary today to have pt dc over weekend. MD informed of above.  [place], [place]

## 2013-10-09 NOTE — Progress Notes (Signed)
Clinical Social Work Department BRIEF PSYCHOSOCIAL ASSESSMENT 10/09/2013  Patient:  [name], [name]     Account Number:  [id number]     [place] date:  10/08/2013  Clinical Social Worker:  Adair Laundry  Date/Time:  10/09/2013 10:00 AM  Referred by:  Physician  Date Referred:  10/09/2013 Referred for  SNF Placement   Other Referral:   Interview type:  Family Other interview type:   Spoke with pt daughter [name] over the phone    PSYCHOSOCIAL DATA Living Status:  HUSBAND Admitted from facility:   Level of care:   Primary support name:  [name] [phone] Primary support relationship to patient:  CHILD, ADULT Degree of support available:   Pt has good support system    CURRENT CONCERNS Current Concerns  Post-Acute Placement   Other Concerns:    SOCIAL WORK ASSESSMENT / PLAN CSW informed by MD that pt will likely need SNF placement and potentially ready for dc tomorrow. CSW also informed that pt husband speaks limited [place]. CSW called and spoke with pt daughter [name] who confirmed it was okay for CSW to speak to her and she would relay conversation to pt and pt husband to insure that nothing is missed in translation. CSW explained that MD believes pt will need rehab at dc. Pt daughter understanding and confirmed pt has been to [facility] in the past. Pt daughter unable to recall past facility name but is requesting [facility] at this time. CSW explained SNF referral process and pt daughter is agreeable to referal being sent to all of [facility]. CSW also explained insurance coverage to pt daughter. Pt daughter having difficulty understanding Medicare in patient stay but is understanding that if pt does go to SNF will be using Medicaid benefit and will need to stay for at least 30 days at facility.    CSW called preferred facility and notified of family preference. They are currently reviewing clincals.   Assessment/plan status:  Psychosocial  Support/Ongoing Assessment of Needs Other assessment/ plan:   Information/referral to community resources:   SNF list to be provided with bed offers.    PATIENT'S/FAMILY'S RESPONSE TO PLAN OF CARE: Pt family agreeable to SNF       [place], [place]

## 2013-10-09 NOTE — Procedures (Signed)
ELECTROENCEPHALOGRAM REPORT   Patient: [name]       Room #: 2N56 EEG No. ID: [id number] Age: 80 y.o.        Sex: female Referring Physician: Short Report Date:  10/09/2013        Interpreting Physician: Alexis Goodell  History: [name] is an 80 y.o. female with altered mental status  Medications:  Scheduled: . anastrozole  1 mg Oral Daily  . aspirin EC  81 mg Oral Daily  . cefTRIAXone (ROCEPHIN)  IV  1 g Intravenous Q24H  . [START ON 10/19/2013] conjugated estrogens  1 g Vaginal Once per day on Mon Thu  . divalproex  500 mg Oral TID  . docusate sodium  100 mg Oral BID  . FLUoxetine  10 mg Oral Daily  . heparin  5,000 Units Subcutaneous 3 times per day  . metoprolol succinate  50 mg Oral Q24H  . pantoprazole  40 mg Oral BID  . risperiDONE  1 mg Oral BID  . sodium chloride  3 mL Intravenous Q12H  . tamsulosin  0.4 mg Oral Daily  . thiamine  100 mg Oral Daily  . vitamin B-12  1,000 mcg Oral Daily    Conditions of Recording:  This is a 16 channel EEG carried out with the patient in the awake state.  Description:  The waking background activity consists of a low voltage, symmetrical, fairly well organized, 7 Hz theta activity, seen from the parieto-occipital and posterior temporal regions.  Low voltage fast activity, poorly organized, is seen anteriorly and is at times superimposed on more posterior regions.  A mixture of theta and alpha rhythms are seen from the central and temporal regions.  No epileptiform activity is noted. The patient does not drowse or sleep and is agitated throughout much of the tracing. Hyperventilation and intermittent photic stimulation were not performed.   IMPRESSION: This is an abnormal EEG secondary to posterior background slowing.  This finding may be seen with a diffuse gray matter disturbance that is etiologically nonspecific, but may include a dementia, among other possibilities.  No epileptiform activity is noted.     Comment:  An EEG with the patient sleep deprived to elicit drowse and light sleep may be desirable to further elicit a possible seizure disorder.     Alexis Goodell, MD [facility] [phone] 10/09/2013, 6:30 PM

## 2013-10-09 NOTE — Progress Notes (Addendum)
RN paged secondary to pt being lethargic and daughter stating her speech was slurred. RN reported calling rapid response and this NP spoke with her Nevin Bloodgood, Therapist, sports). Nevin Bloodgood states pt is arousable, MOE x 4, no strength deficits but speech is garbled. (pt doesn't speak english). Pupils reactive but sluggish. Pt fell getting up to bathroom with daughter around 2am. No injury noted. Pt fell on her bottom and did not hit head nor had any pain. Pt was admitted with altered mental status. She has had 2 doses of Ativan since 3pm 10/08/13, Haldol at 3:30pm, Risperdal and Ativan at hs 10/09/13.  This may be medication effects, but given fall, will get stat CT head. Pt is also due for MRI brain today for AMS. Pt more responsive after rapid response RN arrived. No respiratory issues. Will follow CT scan.  Clance Boll, NP [facility] Update: pt much more alert. CT head neg for acute findings, likely medication related. Ambien d/c'd. Having tremors around mouth ? Side affects from antipsychotic? Will report to oncoming attending for possible medication adjustment.  Labs ordered and are pending.  KJKG, NP

## 2013-10-09 NOTE — Progress Notes (Signed)
Echocardiogram 2D Echocardiogram has been performed.  [name] [name] 10/09/2013, 11:26 AM

## 2013-10-10 ENCOUNTER — Inpatient Hospital Stay (HOSPITAL_COMMUNITY): Payer: Medicare Other

## 2013-10-10 DIAGNOSIS — I4729 Other ventricular tachycardia: Secondary | ICD-10-CM

## 2013-10-10 DIAGNOSIS — I472 Ventricular tachycardia: Secondary | ICD-10-CM

## 2013-10-10 DIAGNOSIS — J690 Pneumonitis due to inhalation of food and vomit: Secondary | ICD-10-CM

## 2013-10-10 LAB — BASIC METABOLIC PANEL
BUN: 15 mg/dL (ref 6–23)
CALCIUM: 9.1 mg/dL (ref 8.4–10.5)
CO2: 25 mEq/L (ref 19–32)
Chloride: 101 mEq/L (ref 96–112)
Creatinine, Ser: 1.08 mg/dL (ref 0.50–1.10)
GFR, EST AFRICAN AMERICAN: 53 mL/min — AB (ref >90)
GFR, EST NON AFRICAN AMERICAN: 46 mL/min — AB (ref >90)
Glucose, Bld: 101 mg/dL — ABNORMAL HIGH (ref 70–99)
Potassium: 3.3 mEq/L — ABNORMAL LOW (ref 3.7–5.3)
SODIUM: 138 meq/L (ref 137–147)

## 2013-10-10 LAB — CBC
HCT: 31.8 % — ABNORMAL LOW (ref 36.0–46.0)
HEMOGLOBIN: 10.4 g/dL — AB (ref 12.0–15.0)
MCH: 30.8 pg (ref 26.0–34.0)
MCHC: 32.7 g/dL (ref 30.0–36.0)
MCV: 94.1 fL (ref 78.0–100.0)
PLATELETS: 173 10*3/uL (ref 150–400)
RBC: 3.38 MIL/uL — AB (ref 3.87–5.11)
RDW: 13.6 % (ref 11.5–15.5)
WBC: 6.2 10*3/uL (ref 4.0–10.5)

## 2013-10-10 LAB — URINE CULTURE: Colony Count: >=100000

## 2013-10-10 LAB — LIPID PANEL
CHOLESTEROL: 272 mg/dL — AB (ref 0–200)
HDL: 57 mg/dL (ref >39)
LDL Cholesterol: 181 mg/dL — ABNORMAL HIGH (ref 0–99)
TRIGLYCERIDES: 170 mg/dL — AB (ref <150)
Total CHOL/HDL Ratio: 4.8 RATIO
VLDL: 34 mg/dL (ref 0–40)

## 2013-10-10 MED ORDER — LORAZEPAM 1 MG PO TABS: 1.0000 mg | ORAL_TABLET | Freq: Three times a day (TID) | ORAL | Status: DC | PRN

## 2013-10-10 MED ORDER — CEPHALEXIN 500 MG PO CAPS: 500.0000 mg | ORAL_CAPSULE | Freq: Two times a day (BID) | ORAL | Status: DC

## 2013-10-10 MED ORDER — METOPROLOL SUCCINATE ER 100 MG PO TB24: 100.0000 mg | ORAL_TABLET | ORAL | Status: AC

## 2013-10-10 MED ORDER — ATORVASTATIN CALCIUM 40 MG PO TABS
40.0000 mg | ORAL_TABLET | Freq: Every day | ORAL | Status: DC
  Filled 2013-10-10: qty 1

## 2013-10-10 MED ORDER — METHADONE HCL 10 MG PO TABS: 10.0000 mg | ORAL_TABLET | Freq: Every day | ORAL | Status: AC | PRN

## 2013-10-10 MED ORDER — ATORVASTATIN CALCIUM 40 MG PO TABS: 40.0000 mg | ORAL_TABLET | Freq: Every day | ORAL | Status: DC

## 2013-10-10 MED ORDER — METOPROLOL SUCCINATE ER 100 MG PO TB24
100.0000 mg | ORAL_TABLET | ORAL | Status: DC
  Filled 2013-10-10: qty 1

## 2013-10-10 MED ORDER — SACCHAROMYCES BOULARDII 250 MG PO CAPS: 250.0000 mg | ORAL_CAPSULE | Freq: Two times a day (BID) | ORAL | Status: DC

## 2013-10-10 MED ORDER — CLINDAMYCIN PHOSPHATE 600 MG/50ML IV SOLN
600.0000 mg | Freq: Once | INTRAVENOUS | Status: AC
  Administered 2013-10-10: 600 mg via INTRAVENOUS
  Filled 2013-10-10: qty 50

## 2013-10-10 MED ORDER — CLINDAMYCIN HCL 300 MG PO CAPS: 300.0000 mg | ORAL_CAPSULE | Freq: Three times a day (TID) | ORAL | Status: DC

## 2013-10-10 MED ORDER — POTASSIUM CHLORIDE CRYS ER 20 MEQ PO TBCR
30.0000 meq | EXTENDED_RELEASE_TABLET | Freq: Once | ORAL | Status: AC
  Administered 2013-10-10: 30 meq via ORAL
  Filled 2013-10-10: qty 1

## 2013-10-10 NOTE — Discharge Summary (Addendum)
Physician Discharge Summary  [name] TIR:[id number] DOB: July 30, 1927 DOA: 10/08/2013  PCP: Walker Kehr, MD  Admit date: 10/08/2013 Discharge date: 10/10/2013  Recommendations for Outpatient Follow-up:  1. Transfer to SNF for ongoing PT/OT  2. Continue keflex for urinary tract infection until all tabs are gone, last day on 6/12, then stop. 3. Please followup pending urine culture to confirm the Keflex is adequate treatment 4. Continue clindamycin for aspiration pneumonia through 6/12, then stop.  Continue florastor while on antibiotics to reduce the risk of C. Diff diarrhea.   5. F/u with cardiology in 1 month   Discharge Diagnoses:  Principal Problem:   Acute encephalopathy due to UTI Active Problems:   PSYCHOSIS   DEPRESSION   BREAST CANCER, HX OF   CAD (coronary artery disease)   UTI (urinary tract infection)   NSVT (nonsustained ventricular tachycardia)   Aspiration pneumonia   Discharge Condition: stable, improved   Diet recommendation: healthy heart  Wt Readings from Last 3 Encounters:  10/08/13 72.576 kg (160 lb)  10/07/13 70.761 kg (156 lb)  09/11/13 72.984 kg (160 lb 14.4 oz)    History of present illness:  [name] is a 80 y.o. female with a past medical history of coronary artery disease, breast cancer, depression, psychosis who lives with her husband. Her daughter also cares for her. Patient doesn't speak any [place]. Daughter was at the bedside, who interpreted. Apparently, for the last 3 days patient has not been acting normally. She's had some weakness and poor balance. She has been speaking without making much sense. She hasn't slept in 3 days. And all this got worse yesterday. She was taken for a routine visit to her primary care physician. The PCP thought that she was having paranoid symptoms. The daughter also tells me that for the past 6 months or so patient's memory has been getting worse. She's had on and off symptoms of confusion. More  recently denies any history of fever, chills. Had couple episodes of vomiting 2 days ago, but none recently. No diarrhea, but she has been constipated. Since her symptoms were getting worse she was brought into the hospital. So history is limited due to language barrier and cognitive impairment.  Hospital Course:   Acute encephalopathy likely due to UTI superimposed on underlying dementia and psychotic tendencies. Episode of obtundation which is likely due to multiple sedating medications. Her mentation rapidly improved with treatment of her urinary tract infection a resumption of her home medications. - MRI brain:  Negative for stroke - TSH 2.6, RPR NR, B12 380  - Start B12 supplementation  - Folate pending  - LFTs wnl, ammonia 26  - Tx UTI  - ABG consistent with respiratory alkalosis, no hypercapenia  Fall without head trauma  - Head CT wnl   Urinary tract infection, present on admission.  Started on enteric ceftriaxone and transitioned to Keflex for discharge. Plan to complete a seven-day course.  SNF to please follow up on pending urine culture, however, patient clinically improved after starting antibiotics.    Aspiration pneumonia.  She developed a transient low grade fever to 100.11F on 6/5 overnight.  Since she was clincally improving with ceftriaxone for her UTI, it was felt that resistent UTI was not the cause.  Since she was obtunded the day prior, there was suspicion for aspiration, which was confirmed by CXR which demonstrated a RUL opacity.  Due to multiple medication allergies, she was unable to use combination keflex/flagyl, so she was started on  clindamycin and florastor in addition to her keflex.  Oxygen saturation stable on room air.    Nonsustained VT: Patient remained asymptomatic. EKG does not show any clear-cut ischemic changes.  - troponings neg  - Potassium and magnesium wnl  - ECHO demonstrated ejection fraction of 55-60% with inferolateral hypokinesis, grade 1  diastolic dysfunction. - BB increased by cardiology  - Telemetry: PVC, NSR  -  Recommend followup with cardiology for further evaluation of possible CAD if indicated.  Psychosis/depression: she frequently has transient behavioral disturbance when she has UTIs.  She has been compliant with her medications.  Continued her home medications including Depakote, Risperdal, fluoxetine.  Haldol may have contributed to extrapyramidal symptoms with tongue thrusting so this prn medication was discontinued.    Breast cancer, stable, continued Arimidex.   CAD/HTN, blood pressures elevated.  Continued ASA, her beta blocker was increased.  She was started on atorvastatin  Lab Results  Component Value Date   CHOL 272* 10/10/2013   HDL 57 10/10/2013   LDLCALC 181* 10/10/2013   LDLDIRECT 195.8 10/07/2009   TRIG 170* 10/10/2013   CHOLHDL 4.8 10/10/2013   Chronic pain, stable, continue methadone   Normocytic anemia, chronic stable and likely secondary to chronic disease.   Consultants:  Cardiology Procedures:  CT head  CT head  CXR  MRI brain  ECHO Antibiotics:  Ceftriaxone 6/4 >> 6/6 Keflex 6/6 >> Clindamycin 6/6   Discharge Exam: Filed Vitals:   10/10/13 0645  BP: 156/67  Pulse: 75  Temp: 98.1 F (36.7 C)  Resp: 18   Filed Vitals:   10/09/13 1418 10/09/13 2110 10/09/13 2231 10/10/13 0645  BP: 140/81 184/83 161/61 156/67  Pulse: 77 88 79 75  Temp: 98 F (36.7 C) 100.1 F (37.8 C) 98.8 F (37.1 C) 98.1 F (36.7 C)  TempSrc: Oral  Oral Axillary  Resp: 18 18  18   Height:      Weight:      SpO2: 90% 93% 93% 94%    General: Adult female, awake, alert, speech much clearer per husband and almost back to baseline HEENT: NCAT, MMM  Cardiovascular: RRR, nl S1, S2 no mrg, 2+ pulses, warm extremities  Respiratory: CTAB, no increased WOB  Abdomen: NABS, soft, NT/ND  MSK: Normal tone and bulk, no LEE  Neuro: Grossly intact   Discharge Instructions      Discharge Instructions   Call MD  for:  difficulty breathing, headache or visual disturbances    Complete by:  As directed      Call MD for:  extreme fatigue    Complete by:  As directed      Call MD for:  hives    Complete by:  As directed      Call MD for:  persistant dizziness or light-headedness    Complete by:  As directed      Call MD for:  persistant nausea and vomiting    Complete by:  As directed      Call MD for:  severe uncontrolled pain    Complete by:  As directed      Call MD for:  temperature >100.4    Complete by:  As directed      Diet - low sodium heart healthy    Complete by:  As directed      Discharge instructions    Complete by:  As directed   Please continue keflex for urinary tract infection and clindamycin for aspiration pneumonia.  If she develops worsening  fevers, chills, shortness of breath, return to the hospital for reevaluation.     Driving Restrictions    Complete by:  As directed   No driving or operating heavy machinery     Increase activity slowly    Complete by:  As directed             Medication List    STOP taking these medications       trimethoprim 100 MG tablet  Commonly known as:  TRIMPEX      TAKE these medications       anastrozole 1 MG tablet  Commonly known as:  ARIMIDEX  Take 1 mg by mouth daily.     aspirin 81 MG EC tablet  Take 1 tablet (81 mg total) by mouth daily.     atorvastatin 40 MG tablet  Commonly known as:  LIPITOR  Take 1 tablet (40 mg total) by mouth daily at 6 PM.     cephALEXin 500 MG capsule  Commonly known as:  KEFLEX  Take 1 capsule (500 mg total) by mouth 2 (two) times daily.     clindamycin 300 MG capsule  Commonly known as:  CLEOCIN  Take 1 capsule (300 mg total) by mouth 3 (three) times daily.     conjugated estrogens vaginal cream  Commonly known as:  PREMARIN  Place 0.5 Applicatorfuls vaginally 2 (two) times a week.     diphenhydrAMINE 25 mg capsule  Commonly known as:  BENADRYL  Take 25 mg by mouth every 6 (six)  hours as needed.     divalproex 500 MG DR tablet  Commonly known as:  DEPAKOTE  Take 1 tablet (500 mg total) by mouth 3 (three) times daily.     DSS 100 MG Caps  Take 100 mg by mouth 2 (two) times daily.     FLUoxetine 10 MG capsule  Commonly known as:  PROZAC  Take 10 mg by mouth daily.     indapamide 2.5 MG tablet  Commonly known as:  LOZOL  Take 1 tablet (2.5 mg total) by mouth daily.     LORazepam 1 MG tablet  Commonly known as:  ATIVAN  Take 1 mg by mouth every 8 (eight) hours as needed for anxiety.     methadone 10 MG tablet  Commonly known as:  DOLOPHINE  Take 1 tablet (10 mg total) by mouth 5 (five) times daily as needed for severe pain. Fill on or after 12/29/13. Once every 30 days     metoprolol succinate 100 MG 24 hr tablet  Commonly known as:  TOPROL-XL  Take 1 tablet (100 mg total) by mouth daily. Take with or immediately following a meal.     omeprazole 20 MG capsule  Commonly known as:  PRILOSEC  Take 1 capsule (20 mg total) by mouth 2 (two) times daily.     promethazine 25 MG tablet  Commonly known as:  PHENERGAN  Take 1 tablet (25 mg total) by mouth every 8 (eight) hours as needed for nausea.     risperiDONE 1 MG tablet  Commonly known as:  RISPERDAL  Take 1 tablet (1 mg total) by mouth 2 (two) times daily.     saccharomyces boulardii 250 MG capsule  Commonly known as:  FLORASTOR  Take 1 capsule (250 mg total) by mouth 2 (two) times daily.     tamsulosin 0.4 MG Caps capsule  Commonly known as:  FLOMAX  Take 1 capsule (0.4 mg total) by mouth daily.  Vitamin D (Ergocalciferol) 50000 UNITS Caps capsule  Commonly known as:  DRISDOL  Take 1 capsule (50,000 Units total) by mouth every 30 (thirty) days.     zolpidem 10 MG tablet  Commonly known as:  AMBIEN  Take 1 tablet (10 mg total) by mouth at bedtime as needed for sleep.       Follow-up Information   Follow up with Walker Kehr, MD. (As needed)    Specialty:  Internal Medicine   Contact  information:   [place] [address] [phone]       Follow up with Dola Argyle, MD. Schedule an appointment as soon as possible for a visit in 1 month.   Specialty:  Cardiology   Contact information:   [address] [place] [address] [phone]        The results of significant diagnostics from this hospitalization (including imaging, microbiology, ancillary and laboratory) are listed below for reference.    Significant Diagnostic Studies: Dg Chest 2 View  10/08/2013   CLINICAL DATA:  Altered mental status.  EXAM: CHEST  2 VIEW  COMPARISON:  PA and lateral chest of January 21, 2013  FINDINGS: The lungs are better inflated today. There is new linear density peripherally in the right midlung and there is blunting of the costophrenic angles bilaterally. The cardiac silhouette is top-normal in size. The pulmonary vascularity is normal. There is tortuosity of the descending thoracic aorta. The patient has undergone previous CABG. There are degenerative changes of both shoulders.  IMPRESSION: Small amounts of pleural fluid are present at the lung bases which may reflect low-grade compensated CHF. No significant interstitial or alveolar edema is demonstrated. Density in the right midlung likely reflects subsegmental atelectasis.   Electronically Signed   By: David  Martinique   On: 10/08/2013 08:11   Ct Head Wo Contrast  10/09/2013   CLINICAL DATA:  Change in mental status  EXAM: CT HEAD WITHOUT CONTRAST  TECHNIQUE: Contiguous axial images were obtained from the base of the skull through the vertex without intravenous contrast.  COMPARISON:  10/08/2013  FINDINGS: Skull and Sinuses:Negative for fracture or destructive process. The sinuses, mastoids, and middle ear cavities are aerated.  Orbits: Bilateral cataract resection.  Brain: No evidence of acute abnormality, such as acute infarction, hemorrhage, hydrocephalus, or mass lesion/mass effect. There is  generalized brain atrophy, mildly age advanced. Moderate chronic small vessel disease is present, with ischemic gliosis mainly in the periventricular and deep cerebral white matter. Remote appearing, small left occipital cortical infarct .  IMPRESSION: 1. No acute intracranial findings. 2. Brain atrophy and remote ischemic injury, described above.   Electronically Signed   By: Jorje Guild M.D.   On: 10/09/2013 05:37   Ct Head Wo Contrast  10/08/2013   CLINICAL DATA:  ALTERED MENTAL STATUS  EXAM: CT HEAD WITHOUT CONTRAST  TECHNIQUE: Contiguous axial images were obtained from the base of the skull through the vertex without intravenous contrast.  COMPARISON:  12/25/2010  FINDINGS: Chronic left posterior parietal encephalomalacia. Diffuse parenchymal atrophy. Patchy areas of hypoattenuation in deep and periventricular white matter bilaterally. Negative for acute intracranial hemorrhage, mass lesion, acute infarction, midline shift, or mass-effect. Acute infarct may be inapparent on noncontrast CT. Ventricles and sulci symmetric. Bone windows demonstrate no focal lesion.  IMPRESSION: 1. Negative for bleed or other acute intracranial process. 2. Atrophy and nonspecific white matter changes as above.   Electronically Signed   By: Delories Heinz.D.  On: 10/08/2013 08:29   Mm Digital Diagnostic Bilat  09/11/2013   CLINICAL DATA:  History of left breast cancer status post lumpectomy 2011.  EXAM: DIGITAL DIAGNOSTIC  BILATERAL MAMMOGRAM WITH CAD  COMPARISON:  With priors.  ACR Breast Density Category b: There are scattered areas of fibroglandular density.  FINDINGS: Stable lumpectomy changes are seen in the left breast. No suspicious mass or malignant type microcalcifications identified.  Mammographic images were processed with CAD.  IMPRESSION: No evidence of malignancy in either breast.  RECOMMENDATION: Bilateral diagnostic mammogram in 1 year is recommended.  I have discussed the findings and recommendations  with the patient. Results were also provided in writing at the conclusion of the visit. If applicable, a reminder letter will be sent to the patient regarding the next appointment.  BI-RADS CATEGORY  2: Benign Finding(s)   Electronically Signed   By: Lillia Mountain M.D.   On: 09/11/2013 11:43    Microbiology: Recent Results (from the past 240 hour(s))  URINE CULTURE     Status: None   Collection Time    10/08/13  9:01 AM      Result Value Ref Range Status   Specimen Description URINE, RANDOM   Final   Special Requests NONE   Final   Culture  Setup Time     Final   Value: 10/08/2013 10:15     Performed at [facility]     Final   Value: >=100,000 COLONIES/ML     Performed at [facility]   Culture     Final   Value: [place]     Performed at [facility]   Report Status PENDING   Incomplete     Labs: Basic Metabolic Panel:  Recent Labs Lab 10/08/13 0905 10/08/13 1500 10/09/13 0554 10/10/13 0518  NA 140  --  141 138  K 4.7  --  3.7 3.3*  CL 103  --  104 101  CO2 21  --  24 25  GLUCOSE 97  --  103* 101*  BUN 20  --  13 15  CREATININE 1.22*  --  1.10 1.08  CALCIUM 9.4  --  9.3 9.1  MG  --  1.9  --   --    Liver Function Tests:  Recent Labs Lab 10/08/13 1500 10/09/13 0554  AST 30 27  ALT 16 16  ALKPHOS 162* 161*  BILITOT 0.3 0.4  PROT 6.8 6.6  ALBUMIN 3.4* 3.3*   No results found for this basename: LIPASE, AMYLASE,  in the last 168 hours  Recent Labs Lab 10/09/13 0554  AMMONIA 35   CBC:  Recent Labs Lab 10/08/13 0905 10/09/13 0554 10/10/13 0518  WBC 7.0 6.1 6.2  NEUTROABS 4.4  --   --   HGB 10.2* 10.6* 10.4*  HCT 30.8* 32.2* 31.8*  MCV 94.8 94.2 94.1  PLT 167 172 173   Cardiac Enzymes:  Recent Labs Lab 10/08/13 0845 10/08/13 1500 10/08/13 2205  TROPONINI <0.30 <0.30 <0.30   BNP: BNP (last 3 results) No results found for this basename: PROBNP,  in the last 8760 hours CBG:  Recent  Labs Lab 10/09/13 [place] 98    Time coordinating discharge: 45 minutes  Signed:  Janece Canterbury  [facility] 10/10/2013, 11:46 AM

## 2013-10-10 NOTE — Clinical Social Work Note (Signed)
CSW made aware patient ready for d/c to SNF bed at [facility]. CSW contacted [facility] and confirmed patient d/c to facility. CSW met with patient and her husband who are both agreeable to d/c plan. CSW spoke with patient's daughter Lenda Kelp and informed her of plan as well. CSW faxed d/c summary to [facility] and provided RN with report and room number. CSW prepared d/c packet and provided packet to patient's husband. Patient's husband and daughter to transport patient to facility. No further needs. CSW signing off.   [place], [place] Weekend Clinical Social Worker [phone]

## 2013-10-10 NOTE — Progress Notes (Addendum)
       Patient Name: [name] Date of Encounter: 10/10/2013    SUBJECTIVE:No CV complaints  TELEMETRY:  No recurrent VT Filed Vitals:   10/09/13 1418 10/09/13 2110 10/09/13 2231 10/10/13 0645  BP: 140/81 184/83 161/61 156/67  Pulse: 77 88 79 75  Temp: 98 F (36.7 C) 100.1 F (37.8 C) 98.8 F (37.1 C) 98.1 F (36.7 C)  TempSrc: Oral  Oral Axillary  Resp: 18 18  18   Height:      Weight:      SpO2: 90% 93% 93% 94%    Intake/Output Summary (Last 24 hours) at 10/10/13 1114 Last data filed at 10/09/13 2103  Gross per 24 hour  Intake    333 ml  Output      0 ml  Net    333 ml   LABS: Basic Metabolic Panel:  Recent Labs  10/08/13 1500 10/09/13 0554 10/10/13 0518  NA  --  141 138  K  --  3.7 3.3*  CL  --  104 101  CO2  --  24 25  GLUCOSE  --  103* 101*  BUN  --  13 15  CREATININE  --  1.10 1.08  CALCIUM  --  9.3 9.1  MG 1.9  --   --    CBC:  Recent Labs  10/08/13 0905 10/09/13 0554 10/10/13 0518  WBC 7.0 6.1 6.2  NEUTROABS 4.4  --   --   HGB 10.2* 10.6* 10.4*  HCT 30.8* 32.2* 31.8*  MCV 94.8 94.2 94.1  PLT 167 172 173   Cardiac Enzymes:  Recent Labs  10/08/13 0845 10/08/13 1500 10/08/13 2205  TROPONINI <0.30 <0.30 <0.30   BNP: No components found with this basename: POCBNP,  Hemoglobin A1C: No results found for this basename: HGBA1C,  in the last 72 hours Fasting Lipid Panel:  Recent Labs  10/10/13 0518  CHOL 272*  HDL 57  LDLCALC 181*  TRIG 170*  CHOLHDL 4.8    Radiology/Studies:  No new  Physical Exam: Blood pressure 156/67, pulse 75, temperature 98.1 F (36.7 C), temperature source Axillary, resp. rate 18, height 5\' 2"  (1.575 m), weight 160 lb (72.576 kg), SpO2 94.00%. Weight change:   Wt Readings from Last 3 Encounters:  10/08/13 160 lb (72.576 kg)  10/07/13 156 lb (70.761 kg)  09/11/13 160 lb 14.4 oz (72.984 kg)    Not examined  ASSESSMENT:  1. NSVT, has not recurred. 2. Small WMA on echo but negative  markers and no angina.  Plan:  1. No further w/u needed for ischemia 2. Maintain increased beta blocker dose.  Signed, Belva Crome III 10/10/2013, 11:14 AM

## 2013-10-10 NOTE — Progress Notes (Signed)
Patient potassium 3.3, NP M.Lynch notified, orders given for 30 mEq PO potassium. Will continue to monitor patient.

## 2013-10-12 ENCOUNTER — Other Ambulatory Visit: Payer: Self-pay | Admitting: *Deleted

## 2013-10-12 MED ORDER — METHADONE HCL 10 MG PO TABS: ORAL_TABLET | ORAL | Status: DC

## 2013-10-12 MED ORDER — ZOLPIDEM TARTRATE 10 MG PO TABS: ORAL_TABLET | ORAL | Status: AC

## 2013-10-12 MED ORDER — LORAZEPAM 1 MG PO TABS: ORAL_TABLET | ORAL | Status: DC

## 2013-10-12 NOTE — Telephone Encounter (Signed)
[facility] 

## 2013-10-14 ENCOUNTER — Non-Acute Institutional Stay (SKILLED_NURSING_FACILITY): Payer: Medicare Other | Admitting: Internal Medicine

## 2013-10-14 DIAGNOSIS — I4729 Other ventricular tachycardia: Secondary | ICD-10-CM

## 2013-10-14 DIAGNOSIS — Z853 Personal history of malignant neoplasm of breast: Secondary | ICD-10-CM

## 2013-10-14 DIAGNOSIS — I209 Angina pectoris, unspecified: Secondary | ICD-10-CM

## 2013-10-14 DIAGNOSIS — F329 Major depressive disorder, single episode, unspecified: Secondary | ICD-10-CM

## 2013-10-14 DIAGNOSIS — I472 Ventricular tachycardia: Secondary | ICD-10-CM

## 2013-10-14 DIAGNOSIS — I25119 Atherosclerotic heart disease of native coronary artery with unspecified angina pectoris: Secondary | ICD-10-CM

## 2013-10-14 DIAGNOSIS — N390 Urinary tract infection, site not specified: Secondary | ICD-10-CM

## 2013-10-14 DIAGNOSIS — G9340 Encephalopathy, unspecified: Secondary | ICD-10-CM

## 2013-10-14 DIAGNOSIS — F29 Unspecified psychosis not due to a substance or known physiological condition: Secondary | ICD-10-CM

## 2013-10-14 DIAGNOSIS — F3289 Other specified depressive episodes: Secondary | ICD-10-CM

## 2013-10-14 DIAGNOSIS — J690 Pneumonitis due to inhalation of food and vomit: Secondary | ICD-10-CM

## 2013-10-14 DIAGNOSIS — I2510 Atherosclerotic heart disease of native coronary artery without angina pectoris: Secondary | ICD-10-CM

## 2013-10-21 ENCOUNTER — Inpatient Hospital Stay (HOSPITAL_COMMUNITY)
Admission: EM | Admit: 2013-10-21 | Discharge: 2013-11-04 | DRG: 248 | Disposition: E | Payer: Medicare Other | Attending: Internal Medicine | Admitting: Internal Medicine

## 2013-10-21 ENCOUNTER — Emergency Department (HOSPITAL_COMMUNITY): Payer: Medicare Other

## 2013-10-21 ENCOUNTER — Encounter (HOSPITAL_COMMUNITY): Payer: Self-pay | Admitting: Radiology

## 2013-10-21 DIAGNOSIS — D649 Anemia, unspecified: Secondary | ICD-10-CM | POA: Diagnosis present

## 2013-10-21 DIAGNOSIS — G9340 Encephalopathy, unspecified: Secondary | ICD-10-CM | POA: Diagnosis not present

## 2013-10-21 DIAGNOSIS — R55 Syncope and collapse: Secondary | ICD-10-CM

## 2013-10-21 DIAGNOSIS — R1011 Right upper quadrant pain: Secondary | ICD-10-CM

## 2013-10-21 DIAGNOSIS — R109 Unspecified abdominal pain: Secondary | ICD-10-CM

## 2013-10-21 DIAGNOSIS — I4729 Other ventricular tachycardia: Secondary | ICD-10-CM

## 2013-10-21 DIAGNOSIS — J9600 Acute respiratory failure, unspecified whether with hypoxia or hypercapnia: Secondary | ICD-10-CM | POA: Diagnosis not present

## 2013-10-21 DIAGNOSIS — I214 Non-ST elevation (NSTEMI) myocardial infarction: Secondary | ICD-10-CM

## 2013-10-21 DIAGNOSIS — I2510 Atherosclerotic heart disease of native coronary artery without angina pectoris: Secondary | ICD-10-CM | POA: Diagnosis present

## 2013-10-21 DIAGNOSIS — R9430 Abnormal result of cardiovascular function study, unspecified: Secondary | ICD-10-CM

## 2013-10-21 DIAGNOSIS — N179 Acute kidney failure, unspecified: Secondary | ICD-10-CM

## 2013-10-21 DIAGNOSIS — G8929 Other chronic pain: Secondary | ICD-10-CM | POA: Diagnosis present

## 2013-10-21 DIAGNOSIS — F429 Obsessive-compulsive disorder, unspecified: Secondary | ICD-10-CM | POA: Diagnosis present

## 2013-10-21 DIAGNOSIS — Z79899 Other long term (current) drug therapy: Secondary | ICD-10-CM

## 2013-10-21 DIAGNOSIS — F459 Somatoform disorder, unspecified: Secondary | ICD-10-CM | POA: Diagnosis present

## 2013-10-21 DIAGNOSIS — J690 Pneumonitis due to inhalation of food and vomit: Secondary | ICD-10-CM | POA: Diagnosis not present

## 2013-10-21 DIAGNOSIS — Z66 Do not resuscitate: Secondary | ICD-10-CM | POA: Diagnosis not present

## 2013-10-21 DIAGNOSIS — I472 Ventricular tachycardia: Secondary | ICD-10-CM

## 2013-10-21 DIAGNOSIS — K219 Gastro-esophageal reflux disease without esophagitis: Secondary | ICD-10-CM | POA: Diagnosis present

## 2013-10-21 DIAGNOSIS — IMO0002 Reserved for concepts with insufficient information to code with codable children: Secondary | ICD-10-CM | POA: Diagnosis present

## 2013-10-21 DIAGNOSIS — I5032 Chronic diastolic (congestive) heart failure: Secondary | ICD-10-CM | POA: Diagnosis present

## 2013-10-21 DIAGNOSIS — Z951 Presence of aortocoronary bypass graft: Secondary | ICD-10-CM

## 2013-10-21 DIAGNOSIS — E785 Hyperlipidemia, unspecified: Secondary | ICD-10-CM | POA: Diagnosis present

## 2013-10-21 DIAGNOSIS — Z7982 Long term (current) use of aspirin: Secondary | ICD-10-CM

## 2013-10-21 DIAGNOSIS — N3000 Acute cystitis without hematuria: Secondary | ICD-10-CM

## 2013-10-21 DIAGNOSIS — Z515 Encounter for palliative care: Secondary | ICD-10-CM

## 2013-10-21 DIAGNOSIS — F3289 Other specified depressive episodes: Secondary | ICD-10-CM | POA: Diagnosis present

## 2013-10-21 DIAGNOSIS — N10 Acute tubulo-interstitial nephritis: Secondary | ICD-10-CM

## 2013-10-21 DIAGNOSIS — N170 Acute kidney failure with tubular necrosis: Secondary | ICD-10-CM | POA: Diagnosis not present

## 2013-10-21 DIAGNOSIS — N393 Stress incontinence (female) (male): Secondary | ICD-10-CM | POA: Diagnosis present

## 2013-10-21 DIAGNOSIS — I959 Hypotension, unspecified: Secondary | ICD-10-CM

## 2013-10-21 DIAGNOSIS — E860 Dehydration: Secondary | ICD-10-CM | POA: Diagnosis not present

## 2013-10-21 DIAGNOSIS — D72829 Elevated white blood cell count, unspecified: Secondary | ICD-10-CM

## 2013-10-21 DIAGNOSIS — I25119 Atherosclerotic heart disease of native coronary artery with unspecified angina pectoris: Secondary | ICD-10-CM

## 2013-10-21 DIAGNOSIS — F411 Generalized anxiety disorder: Secondary | ICD-10-CM | POA: Diagnosis present

## 2013-10-21 DIAGNOSIS — T50995A Adverse effect of other drugs, medicaments and biological substances, initial encounter: Secondary | ICD-10-CM | POA: Diagnosis not present

## 2013-10-21 DIAGNOSIS — I4891 Unspecified atrial fibrillation: Secondary | ICD-10-CM

## 2013-10-21 DIAGNOSIS — D509 Iron deficiency anemia, unspecified: Secondary | ICD-10-CM

## 2013-10-21 DIAGNOSIS — Z8673 Personal history of transient ischemic attack (TIA), and cerebral infarction without residual deficits: Secondary | ICD-10-CM

## 2013-10-21 DIAGNOSIS — C50919 Malignant neoplasm of unspecified site of unspecified female breast: Secondary | ICD-10-CM | POA: Diagnosis present

## 2013-10-21 DIAGNOSIS — F329 Major depressive disorder, single episode, unspecified: Secondary | ICD-10-CM | POA: Diagnosis present

## 2013-10-21 DIAGNOSIS — N390 Urinary tract infection, site not specified: Secondary | ICD-10-CM | POA: Diagnosis not present

## 2013-10-21 DIAGNOSIS — R339 Retention of urine, unspecified: Secondary | ICD-10-CM | POA: Diagnosis not present

## 2013-10-21 DIAGNOSIS — A0472 Enterocolitis due to Clostridium difficile, not specified as recurrent: Secondary | ICD-10-CM | POA: Diagnosis not present

## 2013-10-21 DIAGNOSIS — R6521 Severe sepsis with septic shock: Secondary | ICD-10-CM

## 2013-10-21 DIAGNOSIS — I25710 Atherosclerosis of autologous vein coronary artery bypass graft(s) with unstable angina pectoris: Secondary | ICD-10-CM

## 2013-10-21 DIAGNOSIS — N189 Chronic kidney disease, unspecified: Secondary | ICD-10-CM | POA: Diagnosis present

## 2013-10-21 DIAGNOSIS — R6520 Severe sepsis without septic shock: Secondary | ICD-10-CM

## 2013-10-21 DIAGNOSIS — G894 Chronic pain syndrome: Secondary | ICD-10-CM

## 2013-10-21 DIAGNOSIS — A419 Sepsis, unspecified organism: Secondary | ICD-10-CM | POA: Diagnosis not present

## 2013-10-21 DIAGNOSIS — E876 Hypokalemia: Secondary | ICD-10-CM

## 2013-10-21 HISTORY — DX: Cerebral infarction, unspecified: I63.9

## 2013-10-21 LAB — CBC WITH DIFFERENTIAL/PLATELET
BASOS PCT: 0 % (ref 0–1)
Basophils Absolute: 0 10*3/uL (ref 0.0–0.1)
Eosinophils Absolute: 0.2 10*3/uL (ref 0.0–0.7)
Eosinophils Relative: 1 % (ref 0–5)
HCT: 32 % — ABNORMAL LOW (ref 36.0–46.0)
HEMOGLOBIN: 10.2 g/dL — AB (ref 12.0–15.0)
LYMPHS PCT: 11 % — AB (ref 12–46)
Lymphs Abs: 1.3 10*3/uL (ref 0.7–4.0)
MCH: 30.2 pg (ref 26.0–34.0)
MCHC: 31.9 g/dL (ref 30.0–36.0)
MCV: 94.7 fL (ref 78.0–100.0)
MONOS PCT: 14 % — AB (ref 3–12)
Monocytes Absolute: 1.7 10*3/uL — ABNORMAL HIGH (ref 0.1–1.0)
NEUTROS PCT: 74 % (ref 43–77)
Neutro Abs: 9 10*3/uL — ABNORMAL HIGH (ref 1.7–7.7)
Platelets: 213 10*3/uL (ref 150–400)
RBC: 3.38 MIL/uL — ABNORMAL LOW (ref 3.87–5.11)
RDW: 13.2 % (ref 11.5–15.5)
WBC: 12.2 10*3/uL — ABNORMAL HIGH (ref 4.0–10.5)

## 2013-10-21 LAB — I-STAT CHEM 8, ED
BUN: 21 mg/dL (ref 6–23)
CHLORIDE: 94 meq/L — AB (ref 96–112)
Calcium, Ion: 1.13 mmol/L (ref 1.13–1.30)
Creatinine, Ser: 1.2 mg/dL — ABNORMAL HIGH (ref 0.50–1.10)
Glucose, Bld: 133 mg/dL — ABNORMAL HIGH (ref 70–99)
HEMATOCRIT: 34 % — AB (ref 36.0–46.0)
Hemoglobin: 11.6 g/dL — ABNORMAL LOW (ref 12.0–15.0)
POTASSIUM: 2.8 meq/L — AB (ref 3.7–5.3)
Sodium: 134 mEq/L — ABNORMAL LOW (ref 137–147)
TCO2: 31 mmol/L (ref 0–100)

## 2013-10-21 LAB — URINALYSIS, ROUTINE W REFLEX MICROSCOPIC
BILIRUBIN URINE: NEGATIVE
Glucose, UA: NEGATIVE mg/dL
Hgb urine dipstick: NEGATIVE
Ketones, ur: NEGATIVE mg/dL
LEUKOCYTES UA: NEGATIVE
NITRITE: NEGATIVE
Protein, ur: NEGATIVE mg/dL
SPECIFIC GRAVITY, URINE: 1.018 (ref 1.005–1.030)
UROBILINOGEN UA: 0.2 mg/dL (ref 0.0–1.0)
pH: 5.5 (ref 5.0–8.0)

## 2013-10-21 LAB — BASIC METABOLIC PANEL
BUN: 21 mg/dL (ref 6–23)
CHLORIDE: 94 meq/L — AB (ref 96–112)
CO2: 31 mEq/L (ref 19–32)
Calcium: 8.8 mg/dL (ref 8.4–10.5)
Creatinine, Ser: 0.99 mg/dL (ref 0.50–1.10)
GFR calc non Af Amer: 51 mL/min — ABNORMAL LOW (ref >90)
GFR, EST AFRICAN AMERICAN: 59 mL/min — AB (ref >90)
Glucose, Bld: 123 mg/dL — ABNORMAL HIGH (ref 70–99)
POTASSIUM: 3.5 meq/L — AB (ref 3.7–5.3)
SODIUM: 136 meq/L — AB (ref 137–147)

## 2013-10-21 LAB — I-STAT CG4 LACTIC ACID, ED: LACTIC ACID, VENOUS: 1.32 mmol/L (ref 0.5–2.2)

## 2013-10-21 LAB — I-STAT TROPONIN, ED: Troponin i, poc: 6.7 ng/mL (ref 0.00–0.08)

## 2013-10-21 LAB — HEPARIN LEVEL (UNFRACTIONATED)
HEPARIN UNFRACTIONATED: 0.29 [IU]/mL — AB (ref 0.30–0.70)
Heparin Unfractionated: 0.19 IU/mL — ABNORMAL LOW (ref 0.30–0.70)

## 2013-10-21 LAB — MAGNESIUM: Magnesium: 2.2 mg/dL (ref 1.5–2.5)

## 2013-10-21 LAB — MRSA PCR SCREENING: MRSA by PCR: NEGATIVE

## 2013-10-21 LAB — TROPONIN I: Troponin I: 6.18 ng/mL (ref <0.30)

## 2013-10-21 MED ORDER — BISACODYL 10 MG RE SUPP: 10.0000 mg | Freq: Every day | RECTAL | Status: DC | PRN

## 2013-10-21 MED ORDER — DOCUSATE SODIUM 100 MG PO CAPS
100.0000 mg | ORAL_CAPSULE | Freq: Two times a day (BID) | ORAL | Status: DC
  Administered 2013-10-21 – 2013-10-26 (×8): 100 mg via ORAL
  Filled 2013-10-21 (×13): qty 1

## 2013-10-21 MED ORDER — HEPARIN (PORCINE) IN NACL 100-0.45 UNIT/ML-% IJ SOLN
900.0000 [IU]/h | Freq: Once | INTRAMUSCULAR | Status: AC
  Administered 2013-10-21: 900 [IU]/h via INTRAVENOUS
  Filled 2013-10-21: qty 250

## 2013-10-21 MED ORDER — POTASSIUM CHLORIDE 10 MEQ/100ML IV SOLN
10.0000 meq | INTRAVENOUS | Status: AC
  Administered 2013-10-21 (×3): 10 meq via INTRAVENOUS
  Filled 2013-10-21 (×3): qty 100

## 2013-10-21 MED ORDER — DIVALPROEX SODIUM 500 MG PO DR TAB
500.0000 mg | DELAYED_RELEASE_TABLET | Freq: Three times a day (TID) | ORAL | Status: DC
  Administered 2013-10-21 – 2013-10-27 (×12): 500 mg via ORAL
  Filled 2013-10-21 (×19): qty 1

## 2013-10-21 MED ORDER — ASPIRIN EC 81 MG PO TBEC
81.0000 mg | DELAYED_RELEASE_TABLET | Freq: Every day | ORAL | Status: DC
  Administered 2013-10-23 – 2013-10-26 (×4): 81 mg via ORAL
  Filled 2013-10-21 (×5): qty 1

## 2013-10-21 MED ORDER — ACETAMINOPHEN 325 MG PO TABS: 650.0000 mg | ORAL_TABLET | Freq: Four times a day (QID) | ORAL | Status: DC | PRN

## 2013-10-21 MED ORDER — TAMSULOSIN HCL 0.4 MG PO CAPS
0.4000 mg | ORAL_CAPSULE | Freq: Every day | ORAL | Status: DC
  Administered 2013-10-21 – 2013-10-24 (×3): 0.4 mg via ORAL
  Filled 2013-10-21 (×5): qty 1

## 2013-10-21 MED ORDER — PANTOPRAZOLE SODIUM 40 MG PO TBEC
40.0000 mg | DELAYED_RELEASE_TABLET | Freq: Two times a day (BID) | ORAL | Status: DC
  Administered 2013-10-21 – 2013-10-27 (×10): 40 mg via ORAL
  Filled 2013-10-21 (×9): qty 1

## 2013-10-21 MED ORDER — ASPIRIN 81 MG PO CHEW: 324.0000 mg | CHEWABLE_TABLET | ORAL | Status: AC

## 2013-10-21 MED ORDER — NITROFURANTOIN MONOHYD MACRO 100 MG PO CAPS: 100.0000 mg | ORAL_CAPSULE | Freq: Two times a day (BID) | ORAL | Status: DC

## 2013-10-21 MED ORDER — ASPIRIN EC 81 MG PO TBEC: 81.0000 mg | DELAYED_RELEASE_TABLET | Freq: Every day | ORAL | Status: DC

## 2013-10-21 MED ORDER — METHADONE HCL 10 MG PO TABS
10.0000 mg | ORAL_TABLET | Freq: Every day | ORAL | Status: DC | PRN
  Administered 2013-10-21 – 2013-10-24 (×4): 10 mg via ORAL
  Filled 2013-10-21 (×4): qty 1

## 2013-10-21 MED ORDER — CLOPIDOGREL BISULFATE 75 MG PO TABS: 75.0000 mg | ORAL_TABLET | Freq: Every day | ORAL | Status: DC

## 2013-10-21 MED ORDER — CLOPIDOGREL BISULFATE 300 MG PO TABS: 300.0000 mg | ORAL_TABLET | Freq: Once | ORAL | Status: DC

## 2013-10-21 MED ORDER — ANASTROZOLE 1 MG PO TABS
1.0000 mg | ORAL_TABLET | Freq: Every day | ORAL | Status: DC
  Administered 2013-10-21 – 2013-10-24 (×4): 1 mg via ORAL
  Filled 2013-10-21 (×5): qty 1

## 2013-10-21 MED ORDER — LORAZEPAM 1 MG PO TABS: 1.0000 mg | ORAL_TABLET | Freq: Three times a day (TID) | ORAL | Status: DC | PRN

## 2013-10-21 MED ORDER — SODIUM CHLORIDE 0.9 % IJ SOLN: 3.0000 mL | Freq: Two times a day (BID) | INTRAMUSCULAR | Status: DC

## 2013-10-21 MED ORDER — NITROGLYCERIN 0.4 MG SL SUBL: 0.4000 mg | SUBLINGUAL_TABLET | SUBLINGUAL | Status: DC | PRN

## 2013-10-21 MED ORDER — SODIUM CHLORIDE 0.9 % IV SOLN: 250.0000 mL | INTRAVENOUS | Status: DC | PRN

## 2013-10-21 MED ORDER — METOPROLOL SUCCINATE ER 100 MG PO TB24
100.0000 mg | ORAL_TABLET | ORAL | Status: DC
  Administered 2013-10-22 – 2013-10-23 (×2): 100 mg via ORAL
  Filled 2013-10-21 (×5): qty 1

## 2013-10-21 MED ORDER — CLOPIDOGREL BISULFATE 75 MG PO TABS
75.0000 mg | ORAL_TABLET | Freq: Every day | ORAL | Status: DC
  Administered 2013-10-23 – 2013-10-27 (×5): 75 mg via ORAL
  Filled 2013-10-21 (×9): qty 1

## 2013-10-21 MED ORDER — ESTROGENS, CONJUGATED 0.625 MG/GM VA CREA
1.0000 g | TOPICAL_CREAM | VAGINAL | Status: DC
  Administered 2013-10-22: 0.5 via VAGINAL
  Filled 2013-10-21: qty 42.5

## 2013-10-21 MED ORDER — SODIUM CHLORIDE 0.9 % IV SOLN
1.0000 mL/kg/h | INTRAVENOUS | Status: DC
  Administered 2013-10-22: 1 mL/kg/h via INTRAVENOUS

## 2013-10-21 MED ORDER — INDAPAMIDE 2.5 MG PO TABS
2.5000 mg | ORAL_TABLET | Freq: Every day | ORAL | Status: DC
  Administered 2013-10-21 – 2013-10-23 (×3): 2.5 mg via ORAL
  Filled 2013-10-21 (×4): qty 1

## 2013-10-21 MED ORDER — ASPIRIN 81 MG PO CHEW
81.0000 mg | CHEWABLE_TABLET | ORAL | Status: AC
  Administered 2013-10-22: 81 mg via ORAL
  Filled 2013-10-21: qty 1

## 2013-10-21 MED ORDER — RISPERIDONE 0.5 MG PO TABS
0.5000 mg | ORAL_TABLET | Freq: Every day | ORAL | Status: DC
  Administered 2013-10-22 – 2013-10-23 (×2): 0.5 mg via ORAL
  Filled 2013-10-21 (×2): qty 1

## 2013-10-21 MED ORDER — ASPIRIN 300 MG RE SUPP: 300.0000 mg | RECTAL | Status: AC

## 2013-10-21 MED ORDER — ZOLPIDEM TARTRATE 5 MG PO TABS: 5.0000 mg | ORAL_TABLET | Freq: Every evening | ORAL | Status: DC | PRN

## 2013-10-21 MED ORDER — FLUOXETINE HCL 10 MG PO CAPS
10.0000 mg | ORAL_CAPSULE | Freq: Every day | ORAL | Status: DC
  Administered 2013-10-21 – 2013-10-26 (×6): 10 mg via ORAL
  Filled 2013-10-21 (×7): qty 1

## 2013-10-21 MED ORDER — ASPIRIN 81 MG PO CHEW
324.0000 mg | CHEWABLE_TABLET | Freq: Once | ORAL | Status: AC
  Administered 2013-10-21: 324 mg via ORAL
  Filled 2013-10-21: qty 4

## 2013-10-21 MED ORDER — HEPARIN (PORCINE) IN NACL 100-0.45 UNIT/ML-% IJ SOLN: 900.0000 [IU]/h | INTRAMUSCULAR | Status: DC

## 2013-10-21 MED ORDER — SODIUM CHLORIDE 0.9 % IJ SOLN: 3.0000 mL | INTRAMUSCULAR | Status: DC | PRN

## 2013-10-21 MED ORDER — HEPARIN (PORCINE) IN NACL 100-0.45 UNIT/ML-% IJ SOLN
1150.0000 [IU]/h | INTRAMUSCULAR | Status: DC
  Administered 2013-10-21: 1000 [IU]/h via INTRAVENOUS
  Administered 2013-10-22: 1150 [IU]/h via INTRAVENOUS
  Filled 2013-10-21: qty 250

## 2013-10-21 MED ORDER — RISPERIDONE 1 MG PO TABS
1.0000 mg | ORAL_TABLET | Freq: Every day | ORAL | Status: DC
  Administered 2013-10-21 – 2013-10-22 (×2): 1 mg via ORAL
  Filled 2013-10-21 (×3): qty 1

## 2013-10-21 NOTE — ED Provider Notes (Signed)
CSN: [id number]     Arrival date & time 10/10/2013  0938 History   First MD Initiated Contact with Patient 10/26/2013 [phone]     Chief Complaint  Patient presents with  . Loss of Consciousness     (Consider location/radiation/quality/duration/timing/severity/associated sxs/prior Treatment) Patient is a 80 y.o. female presenting with syncope. The history is provided by the patient, the EMS personnel and medical records. No language interpreter was used.  Loss of Consciousness Episode history:  Single Most recent episode:  Today Timing:  Constant Progression:  Resolved Chronicity:  New Context: standing up   Witnessed: yes   Relieved by:  Nothing Worsened by:  Nothing tried Ineffective treatments:  None tried Associated symptoms: chest pain and shortness of breath   Associated symptoms: no fever   Risk factors: coronary artery disease   Risk factors: no seizures     Past Medical History  Diagnosis Date  . CAD (coronary artery disease)     Catheterization, January, 2007 patent grafts / hospitalization October 2 010 no MI  . Syncope 03/2009    ?orthostasis  . Allergic rhinitis   . History of colon polyps   . Diverticulosis of colon   . GERD (gastroesophageal reflux disease)   . Hyperlipidemia   . Abdominal pain, other specified site     motility disorder, chorinic functional, severe (Dr Olevia Perches). Possible OCD w/complusive use of enemas and laxatives; psychotic features 2011  . Positive H. pylori test 2911  . Psychosomatic disorder     w/GI fixation  . Anxiety   . Depression   . Stress incontinence, female   . Vitamin D deficiency   . Vitamin B12 deficiency   . Renal insufficiency   . UTI (urinary tract infection)   . Hypokalemia     mild  . Insomnia   . Carotid artery disease     Doppler November, 9371, 6-96% LICA, 78-93% R. ICA.Marland Kitchen increased velocity... possibly from serpentine vessel  . Ejection fraction     50-55%, no wall motion abnormalities, echo, November, 2010  .  Edema   . Hemothorax on right   . Breast cancer 2011    Dr Humphrey Rolls- right breast   Past Surgical History  Procedure Laterality Date  . Coronary artery bypass graft    . Myomectomy      Uterine and ovarian  . Breast fibroadenoma surgery    . Hemicolectomy  2005    Left  . Breast lumpectomy  2011  . Right vats (video-assisted thoracoscopic surgery) with     Family History  Problem Relation Age of Onset  . Coronary artery disease      Female 1st degree relative <60  . Colon cancer Mother   . Arthritis Mother   . Heart disease Mother   . Heart disease Father    History  Substance Use Topics  . Smoking status: Never Smoker   . Smokeless tobacco: Never Used  . Alcohol Use: No   OB History   Grav Para Term Preterm Abortions TAB SAB Ect Mult Living                 Review of Systems  Constitutional: Negative for fever.  Respiratory: Positive for shortness of breath.   Cardiovascular: Positive for chest pain and syncope.  All other systems reviewed and are negative.     Allergies  Baclofen; Belladonna; Doxycycline; Iohexol; Metoclopramide; Metronidazole; Nitrofurantoin; Procaine hcl; Sucralfate; Ciprofloxacin; Erythromycin; Gabapentin; Methenamine; Nalbuphine; Penicillins; and Sulfonamide derivatives  Home Medications  Prior to Admission medications   Medication Sig Start Date End Date Taking? Authorizing Provider  acetaminophen (TYLENOL) 325 MG tablet Take 650 mg by mouth every 6 (six) hours as needed for mild pain.   Yes Historical Provider, MD  anastrozole (ARIMIDEX) 1 MG tablet Take 1 mg by mouth daily.   Yes Historical Provider, MD  aspirin EC 81 MG EC tablet Take 1 tablet (81 mg total) by mouth daily. 10/09/13  Yes Janece Canterbury, MD  bisacodyl (DULCOLAX) 10 MG suppository Place 10 mg rectally daily as needed for mild constipation or moderate constipation.   Yes Historical Provider, MD  conjugated estrogens (PREMARIN) vaginal cream Place 0.5 Applicatorfuls vaginally 2  (two) times a week. 04/10/11  Yes Aleksei Plotnikov V, MD  diphenhydrAMINE (BENADRYL) 25 mg capsule Take 25 mg by mouth every 6 (six) hours as needed for itching.    Yes Historical Provider, MD  divalproex (DEPAKOTE) 500 MG DR tablet Take 1 tablet (500 mg total) by mouth 3 (three) times daily. 08/14/13  Yes Aleksei Plotnikov V, MD  docusate sodium 100 MG CAPS Take 100 mg by mouth 2 (two) times daily. 10/09/13  Yes Janece Canterbury, MD  FLUoxetine (PROZAC) 10 MG capsule Take 10 mg by mouth daily.   Yes Historical Provider, MD  indapamide (LOZOL) 2.5 MG tablet Take 1 tablet (2.5 mg total) by mouth daily. 10/09/13  Yes Janece Canterbury, MD  LORazepam (ATIVAN) 1 MG tablet Take one tablet by mouth every 8 hours as needed for anxiety 10/12/13  Yes Tiffany L Reed, DO  Magnesium Hydroxide (MILK OF MAGNESIA PO) Take 30 mLs by mouth daily as needed (constipation).   Yes Historical Provider, MD  methadone (DOLOPHINE) 10 MG tablet Take 1 tablet (10 mg total) by mouth 5 (five) times daily as needed for severe pain. Continuation of previous prescription for transfer to SNF only. 10/10/13  Yes Janece Canterbury, MD  metoprolol succinate (TOPROL-XL) 100 MG 24 hr tablet Take 1 tablet (100 mg total) by mouth daily. Take with or immediately following a meal. 10/10/13  Yes Janece Canterbury, MD  nitrofurantoin, macrocrystal-monohydrate, (MACROBID) 100 MG capsule Take 100 mg by mouth 2 (two) times daily. For 7 days   Yes Historical Provider, MD  omeprazole (PRILOSEC) 20 MG capsule Take 1 capsule (20 mg total) by mouth 2 (two) times daily. 02/03/13  Yes Aleksei Plotnikov V, MD  promethazine (PHENERGAN) 25 MG tablet Take 1 tablet (25 mg total) by mouth every 8 (eight) hours as needed for nausea. 02/03/13  Yes Aleksei Plotnikov V, MD  risperiDONE (RISPERDAL) 0.5 MG tablet Take 0.5 mg by mouth every morning.   Yes Historical Provider, MD  risperiDONE (RISPERDAL) 1 MG tablet Take 1 mg by mouth at bedtime.   Yes Historical Provider, MD  Sodium  Phosphates (RA SALINE ENEMA RE) Place 1 applicator rectally daily as needed (constipation).   Yes Historical Provider, MD  tamsulosin (FLOMAX) 0.4 MG CAPS Take 1 capsule (0.4 mg total) by mouth daily. 12/02/12  Yes Aleksei Plotnikov V, MD  Vitamin D, Ergocalciferol, (DRISDOL) 50000 UNITS CAPS Take 1 capsule (50,000 Units total) by mouth every 30 (thirty) days. 09/06/11  Yes Aleksei Plotnikov V, MD  zolpidem (AMBIEN) 10 MG tablet Take one tablet by mouth at bedtime as needed for sleep 10/12/13  Yes Tiffany L Reed, DO   BP 131/50  Pulse 68  Temp(Src) 98.1 F (36.7 C) (Oral)  Resp 25  SpO2 96% Physical Exam  Constitutional: She appears well-developed and well-nourished. No distress.  HENT:  Head: Normocephalic and atraumatic.  Mouth/Throat: Oropharynx is clear and moist.  Eyes: Conjunctivae and EOM are normal. Pupils are equal, round, and reactive to light.  Neck: Normal range of motion. Neck supple.  Cardiovascular: Normal rate, regular rhythm and intact distal pulses.   Pulmonary/Chest: Effort normal and breath sounds normal. No respiratory distress. She has no wheezes. She has no rales.  Abdominal: Soft. Bowel sounds are normal. There is no tenderness. There is no rebound and no guarding.  Musculoskeletal: Normal range of motion. She exhibits no edema.  Neurological: She is alert. She has normal reflexes.  Skin: Skin is warm and dry.  Psychiatric: She has a normal mood and affect.    ED Course  Procedures (including critical care time) Labs Review Labs Reviewed  CBC WITH DIFFERENTIAL - Abnormal; Notable for the following:    WBC 12.2 (*)    RBC 3.38 (*)    Hemoglobin 10.2 (*)    HCT 32.0 (*)    Neutro Abs 9.0 (*)    Lymphocytes Relative 11 (*)    Monocytes Relative 14 (*)    Monocytes Absolute 1.7 (*)    All other components within normal limits  I-STAT CHEM 8, ED - Abnormal; Notable for the following:    Sodium 134 (*)    Potassium 2.8 (*)    Chloride 94 (*)    Creatinine,  Ser 1.20 (*)    Glucose, Bld 133 (*)    Hemoglobin 11.6 (*)    HCT 34.0 (*)    All other components within normal limits  I-STAT TROPOININ, ED - Abnormal; Notable for the following:    Troponin i, poc 6.70 (*)    All other components within normal limits  MAGNESIUM  URINALYSIS, ROUTINE W REFLEX MICROSCOPIC  I-STAT CG4 LACTIC ACID, ED    Imaging Review Dg Chest 2 View  11/01/2013   CLINICAL DATA:  Altered mental status.  EXAM: CHEST  2 VIEW  COMPARISON:  Chest radiograph October 10, 2013  FINDINGS: Cardiac silhouette appears mild to moderately enlarged, similar. Somewhat prominent appearance of mediastinal, attributable to AP technique and rotation to the right. Status post median sternotomy for apparent coronary artery bypass grafting. Mild chronic interstitial changes, left midlung zone granuloma. Surgical clips in the left lung base. Bibasilar strandy densities. No pleural effusions or focal consolidations. Improved aeration of the right upper lobe. No pneumothorax.  Remote right lateral rib fractures. Osteopenia. Severe degenerative change of the right greater than left shoulders.  IMPRESSION: Similar cardiomegaly, and bibasilar atelectasis.   Electronically Signed   By: Elon Alas   On: 10/29/2013 04:51   Ct Head Wo Contrast  10/23/2013   CLINICAL DATA:  Syncopal episode, temporarily nonresponsive. History of left internal carotid artery occlusion.  EXAM: CT HEAD WITHOUT CONTRAST  TECHNIQUE: Contiguous axial images were obtained from the base of the skull through the vertex without intravenous contrast.  COMPARISON:  CT of the head October 09, 2013 and MRI of the brain October 09, 2013  FINDINGS: The ventricles and sulci are normal for age. No intraparenchymal hemorrhage, mass effect nor midline shift. Confluent supratentorial white matter hypodensities are within normal range for patient's age and though non-specific suggest sequelae of chronic small vessel ischemic disease. No acute large vascular  territory infarcts. Remote left occipital transcortical infarct with ex vacuo dilatation of the left occipital horn.  No abnormal extra-axial fluid collections. Basal cisterns are patent. Mild calcific atherosclerosis of the carotid siphons.  No skull fracture.  The included ocular globes and orbital contents are non-suspicious. Bilateral ocular lens implants. Small left sphenoid mucosal retention cysts without paranasal sinus air-fluid levels. Partially imaged severe C1-2 osteoarthrosis. Severe right, moderate left temporomandibular osteoarthrosis.  IMPRESSION: No acute intracranial process.  Remote left posterior cerebral artery territory infarct with moderate to severe white matter changes suggesting chronic small vessel ischemic disease.   Electronically Signed   By: Elon Alas   On: 10/22/2013 04:45     EKG Interpretation   Date/Time:  Wednesday October 21 2013 03:39:58 EDT Ventricular Rate:  71 PR Interval:  174 QRS Duration: 109 QT Interval:  413 QTC Calculation: 449 R Axis:   23 Text Interpretation:  Sinus rhythm Confirmed by [facility]  MD, APRIL  ([id number]) on 11/03/2013 3:46:26 AM      MDM   Final diagnoses:  None    NSTEMI and syncope with hypokalemia    April K Palumbo-Rasch, MD 10/27/2013 [phone]

## 2013-10-21 NOTE — H&P (Signed)
Admission History and Physical     Patient ID: [name], MRN: [id number], DOB: May 29, 1927 80 y.o. Date of Encounter: 10/25/2013, 5:58 AM  Primary Physician: Walker Kehr, MD Primary Cardiologist: Ron Parker  Chief Complaint:  Syncope  History of Present Illness: [name] is a 80 y.o. 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 Her last heart cath was 2007 and grafts were patent, LAD 90%, circumflex 80%, and RCA occluded.  Admitted here two weeks ago with UTI, confusion.  Seen by Cardiology at that time for NSVT (9 beat run in ED).  EKG with prominent lateral T-wave changes but had normal troponin.  Echo showed small wall motion abnormality but NSVT did not recur and there was no chest pain.  BB increased, and no further eval recommended.  She was discharged to SNF on 6/7.  She is russian speaking only so history is translated through daughter.  She states she has had CP on and off for 3 days.  Today, after using the bathroom, she had a syncopal event from a standing position.  EMS report states patient was observed to clutch her chest but patient does not recall this.  She hit her head but CT scan negative for bleed.  Troponin i in ED 6.7.  EKG looks normal without previously seen inferior Q-waves or lateral TWI.  I have asked for a repeat.  No current chest pain.  Also hypokalemic and this was repleted.     Past Medical History  Diagnosis Date  . CAD (coronary artery disease)     Catheterization, January, 2007 patent grafts / hospitalization October 2 010 no MI  . Syncope 03/2009    ?orthostasis  . Allergic rhinitis   . History of colon polyps   . Diverticulosis of colon   . GERD (gastroesophageal reflux disease)   . Hyperlipidemia   . Abdominal pain, other specified site     motility disorder,  chorinic functional, severe (Dr Olevia Perches). Possible OCD w/complusive use of enemas and laxatives; psychotic features 2011  . Positive H. pylori test 2911  . Psychosomatic disorder     w/GI fixation  . Anxiety   . Depression   . Stress incontinence, female   . Vitamin D deficiency   . Vitamin B12 deficiency   . Renal insufficiency   . UTI (urinary tract infection)   . Hypokalemia     mild  . Insomnia   . Carotid artery disease     Doppler November, 3790, 2-40% LICA, 97-35% R. ICA.Marland Kitchen increased velocity... possibly from serpentine vessel  . Ejection fraction     50-55%, no wall motion abnormalities, echo, November, 2010  . Edema   . Hemothorax on right   . Breast cancer 2011    Dr Humphrey Rolls- right breast     Past Surgical History  Procedure Laterality Date  . Coronary artery bypass graft    . Myomectomy      Uterine and ovarian  . Breast fibroadenoma surgery    . Hemicolectomy  2005    Left  . Breast lumpectomy  2011  . Right vats (video-assisted thoracoscopic surgery) with        Current Facility-Administered Medications  Medication Dose Route Frequency Hazael Olveda Last Rate Last Dose  . aspirin chewable tablet 324 mg  324 mg Oral NOW Stephani Police,  MD       Or  . aspirin suppository 300 mg  300 mg Rectal NOW Stephani Police, MD      . Derrill Memo ON 10/21/2013] aspirin EC tablet 81 mg  81 mg Oral Daily Stephani Police, MD      . clopidogrel (PLAVIX) tablet 300 mg  300 mg Oral Once Stephani Police, MD      . clopidogrel (PLAVIX) tablet 75 mg  75 mg Oral Q breakfast Stephani Police, MD      . heparin ADULT infusion 100 units/mL (25000 units/250 mL)  900 Units/hr Intravenous Once April K Palumbo-Rasch, MD      . nitroGLYCERIN (NITROSTAT) SL tablet 0.4 mg  0.4 mg Sublingual Q5 Min x 3 PRN Stephani Police, MD      . potassium chloride 10 mEq in 100 mL IVPB  10 mEq Intravenous Q1 Hr x 4 April K Palumbo-Rasch, MD 100 mL/hr at 11/02/2013 0550 10 mEq at 11/02/2013 0550   Current Outpatient Prescriptions    Medication Sig Dispense Refill  . acetaminophen (TYLENOL) 325 MG tablet Take 650 mg by mouth every 6 (six) hours as needed for mild pain.      Marland Kitchen anastrozole (ARIMIDEX) 1 MG tablet Take 1 mg by mouth daily.      Marland Kitchen aspirin EC 81 MG EC tablet Take 1 tablet (81 mg total) by mouth daily.      . bisacodyl (DULCOLAX) 10 MG suppository Place 10 mg rectally daily as needed for mild constipation or moderate constipation.      . conjugated estrogens (PREMARIN) vaginal cream Place 0.5 Applicatorfuls vaginally 2 (two) times a week.  42.5 g  5  . diphenhydrAMINE (BENADRYL) 25 mg capsule Take 25 mg by mouth every 6 (six) hours as needed for itching.       . divalproex (DEPAKOTE) 500 MG DR tablet Take 1 tablet (500 mg total) by mouth 3 (three) times daily.  90 tablet  3  . docusate sodium 100 MG CAPS Take 100 mg by mouth 2 (two) times daily.  10 capsule  0  . FLUoxetine (PROZAC) 10 MG capsule Take 10 mg by mouth daily.      . indapamide (LOZOL) 2.5 MG tablet Take 1 tablet (2.5 mg total) by mouth daily.  30 tablet  0  . LORazepam (ATIVAN) 1 MG tablet Take one tablet by mouth every 8 hours as needed for anxiety  90 tablet  5  . Magnesium Hydroxide (MILK OF MAGNESIA PO) Take 30 mLs by mouth daily as needed (constipation).      . methadone (DOLOPHINE) 10 MG tablet Take 1 tablet (10 mg total) by mouth 5 (five) times daily as needed for severe pain. Continuation of previous prescription for transfer to SNF only.  150 tablet  0  . metoprolol succinate (TOPROL-XL) 100 MG 24 hr tablet Take 1 tablet (100 mg total) by mouth daily. Take with or immediately following a meal.  30 tablet  0  . nitrofurantoin, macrocrystal-monohydrate, (MACROBID) 100 MG capsule Take 100 mg by mouth 2 (two) times daily. For 7 days      . omeprazole (PRILOSEC) 20 MG capsule Take 1 capsule (20 mg total) by mouth 2 (two) times daily.  180 capsule  3  . promethazine (PHENERGAN) 25 MG tablet Take 1 tablet (25 mg total) by mouth every 8 (eight) hours  as needed for nausea.  60 tablet  5  . risperiDONE (RISPERDAL) 0.5 MG tablet Take 0.5 mg by mouth every morning.      Marland Kitchen  risperiDONE (RISPERDAL) 1 MG tablet Take 1 mg by mouth at bedtime.      . Sodium Phosphates (RA SALINE ENEMA RE) Place 1 applicator rectally daily as needed (constipation).      . tamsulosin (FLOMAX) 0.4 MG CAPS Take 1 capsule (0.4 mg total) by mouth daily.  90 capsule  3  . Vitamin D, Ergocalciferol, (DRISDOL) 50000 UNITS CAPS Take 1 capsule (50,000 Units total) by mouth every 30 (thirty) days.  6 capsule  1  . zolpidem (AMBIEN) 10 MG tablet Take one tablet by mouth at bedtime as needed for sleep  30 tablet  5      Allergies: Allergies  Allergen Reactions  . Baclofen     REACTION: confusion  . Belladonna Other (See Comments)    unknown  . Doxycycline Other (See Comments)    unknown  . Iohexol      Code: RASH   . Metoclopramide     Clonic spasms  . Metronidazole     REACTION: rash  . Nitrofurantoin     REACTION: nausea  . Procaine Hcl   . Sucralfate     REACTION: nausea  . Ciprofloxacin Itching and Rash  . Erythromycin Rash and Other (See Comments)    fever  . Gabapentin Rash  . Methenamine Rash  . Nalbuphine Rash  . Penicillins Rash  . Sulfonamide Derivatives Rash     Social History:  The patient  reports that she has never smoked. She has never used smokeless tobacco. She reports that she does not drink alcohol or use illicit drugs.   Family History:  The patient's family history includes Arthritis in her mother; Colon cancer in her mother; Coronary artery disease in an other family member; Heart disease in her father and mother.   ROS:  Please see the history of present illness.  All other systems reviewed and negative.   Vital Signs: Blood pressure 145/55, pulse 71, temperature 98.1 F (36.7 C), temperature source Oral, resp. rate 16, SpO2 96.00%.  PHYSICAL EXAM: General:  Well nourished, well developed, in no acute distress HEENT:  normal Lymph: no adenopathy Neck: no JVD Endocrine:  No thryomegaly Vascular: No carotid bruits; FA pulses 2+ bilaterally without bruits Cardiac:  normal S1, S2; RRR; 2/6 SEM LUSB Lungs:  clear to auscultation bilaterally, no wheezing, rhonchi or rales Abd: soft, nontender, no hepatomegaly Ext: no edema Musculoskeletal:  No deformities, BUE and BLE strength normal and equal Skin: warm and dry Neuro:  CNs 2-12 intact, no focal abnormalities noted Psych:  Normal affect   EKG:   Normal appearing without prior inferior q waves or lateral TWI.  Repeat pending.  Labs:   Lab Results  Component Value Date   WBC 12.2* 10/06/2013   HGB 11.6* 10/11/2013   HCT 34.0* 10/18/2013   MCV 94.7 10/30/2013   PLT 213 10/20/2013    Recent Labs Lab 10/13/2013 0352  NA 134*  K 2.8*  CL 94*  BUN 21  CREATININE 1.20*  GLUCOSE 133*   No results found for this basename: CKTOTAL, CKMB, TROPONINI,  in the last 72 hours Lab Results  Component Value Date   CHOL 272* 10/10/2013   HDL 57 10/10/2013   LDLCALC 181* 10/10/2013   TRIG 170* 10/10/2013   No results found for this basename: DDIMER    Radiology/Studies:  Dg Chest 2 View  10/19/2013   CLINICAL DATA:  Altered mental status.  EXAM: CHEST  2 VIEW  COMPARISON:  Chest radiograph October 10, 2013  FINDINGS: Cardiac silhouette appears mild to moderately enlarged, similar. Somewhat prominent appearance of mediastinal, attributable to AP technique and rotation to the right. Status post median sternotomy for apparent coronary artery bypass grafting. Mild chronic interstitial changes, left midlung zone granuloma. Surgical clips in the left lung base. Bibasilar strandy densities. No pleural effusions or focal consolidations. Improved aeration of the right upper lobe. No pneumothorax.  Remote right lateral rib fractures. Osteopenia. Severe degenerative change of the right greater than left shoulders.  IMPRESSION: Similar cardiomegaly, and bibasilar atelectasis.    Electronically Signed   By: Elon Alas   On: 10/25/2013 04:51   Dg Chest 2 View  10/08/2013   CLINICAL DATA:  Altered mental status.  EXAM: CHEST  2 VIEW  COMPARISON:  PA and lateral chest of January 21, 2013  FINDINGS: The lungs are better inflated today. There is new linear density peripherally in the right midlung and there is blunting of the costophrenic angles bilaterally. The cardiac silhouette is top-normal in size. The pulmonary vascularity is normal. There is tortuosity of the descending thoracic aorta. The patient has undergone previous CABG. There are degenerative changes of both shoulders.  IMPRESSION: Small amounts of pleural fluid are present at the lung bases which may reflect low-grade compensated CHF. No significant interstitial or alveolar edema is demonstrated. Density in the right midlung likely reflects subsegmental atelectasis.   Electronically Signed   By: David  Martinique   On: 10/08/2013 08:11   Ct Head Wo Contrast  10/16/2013   CLINICAL DATA:  Syncopal episode, temporarily nonresponsive. History of left internal carotid artery occlusion.  EXAM: CT HEAD WITHOUT CONTRAST  TECHNIQUE: Contiguous axial images were obtained from the base of the skull through the vertex without intravenous contrast.  COMPARISON:  CT of the head October 09, 2013 and MRI of the brain October 09, 2013  FINDINGS: The ventricles and sulci are normal for age. No intraparenchymal hemorrhage, mass effect nor midline shift. Confluent supratentorial white matter hypodensities are within normal range for patient's age and though non-specific suggest sequelae of chronic small vessel ischemic disease. No acute large vascular territory infarcts. Remote left occipital transcortical infarct with ex vacuo dilatation of the left occipital horn.  No abnormal extra-axial fluid collections. Basal cisterns are patent. Mild calcific atherosclerosis of the carotid siphons.  No skull fracture. The included ocular globes and orbital  contents are non-suspicious. Bilateral ocular lens implants. Small left sphenoid mucosal retention cysts without paranasal sinus air-fluid levels. Partially imaged severe C1-2 osteoarthrosis. Severe right, moderate left temporomandibular osteoarthrosis.  IMPRESSION: No acute intracranial process.  Remote left posterior cerebral artery territory infarct with moderate to severe white matter changes suggesting chronic small vessel ischemic disease.   Electronically Signed   By: Elon Alas   On: 10/17/2013 04:45     Dg Chest Port 1 View  10/10/2013   CLINICAL DATA:  New onset of low-grade fever.  Aspiration risk.  EXAM: PORTABLE CHEST - 1 VIEW  COMPARISON:  Chest x-ray 10/08/2013.  FINDINGS: Irregular areas of pleuroparenchymal thickening are again noted in the periphery of the right mid to lower hemithorax, similar to multiple prior examinations, most compatible with chronic post infectious or inflammatory scarring. There is a new area of interstitial prominence in the medial aspect of the right upper lobe, however, which could represent a focus of developing infection. Lungs are otherwise clear. No pleural effusions. No evidence of pulmonary edema. Heart size appears borderline enlarged. The patient is rotated to the right on today's  exam, resulting in distortion of the mediastinal contours and reduced diagnostic sensitivity and specificity for mediastinal pathology. Atherosclerosis in the thoracic aorta. Status post median sternotomy for CABG.  IMPRESSION: 1. New area of ill-defined interstitial prominence in the medial aspect of the right upper lobe concerning for developing pneumonia. Repeat radiographs are recommended until complete resolution of this finding has been observed to exclude the possibility of a centrally obstructing lesion. 2. Mild cardiomegaly. 3. Atherosclerosis.   Electronically Signed   By: Vinnie Langton M.D.   On: 10/10/2013 11:28     ASSESSMENT AND PLAN:   1. NSTEMI s/p 4v  CABG - Repeat EKG - No current chest pain - Heparin and aspirin started in ED.  Plavix loading dose ordered. No recent bleeding. - Continue home metoprolol. Vitals stable. - Order statin after med rec. - NPO for consideration of cath - Repeat troponin pending  2. Syncope - In setting of recent chest pain, hypokalemia, NSVT seen on telemetry last admission - Follow on telemetry - Replete potassium, repeat this morning   3. Hypokalemia - replete as above.  Mg normal.   Needs home meds including psychiatric medications ordered after med rec   Signed,  Stephani Police, MD 10/20/2013, 5:58 AM

## 2013-10-21 NOTE — ED Notes (Signed)
IV team paged per MD request.

## 2013-10-21 NOTE — Progress Notes (Signed)
ANTICOAGULATION CONSULT NOTE - Initial Consult  Pharmacy Consult for Heparin  Indication: chest pain/ACS, NSTEMI  Patient Measurements: ~73 kg  Vital Signs: Temp: 98.1 F (36.7 C) (06/17 0344) Temp src: Oral (06/17 0344) BP: 136/59 mmHg (06/17 0545) Pulse Rate: 71 (06/17 0545)  Labs:  Recent Labs  10/11/2013 0345 10/07/2013 0352  HGB 10.2* 11.6*  HCT 32.0* 34.0*  PLT 213  --   CREATININE  --  1.20*    Medical History: Past Medical History  Diagnosis Date  . CAD (coronary artery disease)     Catheterization, January, 2007 patent grafts / hospitalization October 2 010 no MI  . Syncope 03/2009    ?orthostasis  . Allergic rhinitis   . History of colon polyps   . Diverticulosis of colon   . GERD (gastroesophageal reflux disease)   . Hyperlipidemia   . Abdominal pain, other specified site     motility disorder, chorinic functional, severe (Dr Olevia Perches). Possible OCD w/complusive use of enemas and laxatives; psychotic features 2011  . Positive H. pylori test 2911  . Psychosomatic disorder     w/GI fixation  . Anxiety   . Depression   . Stress incontinence, female   . Vitamin D deficiency   . Vitamin B12 deficiency   . Renal insufficiency   . UTI (urinary tract infection)   . Hypokalemia     mild  . Insomnia   . Carotid artery disease     Doppler November, 0518, 3-35% LICA, 82-51% R. ICA.Marland Kitchen increased velocity... possibly from serpentine vessel  . Ejection fraction     50-55%, no wall motion abnormalities, echo, November, 2010  . Edema   . Hemothorax on right   . Breast cancer 2011    Dr Humphrey Rolls- right breast    Assessment: 80 y/o F to start heparin for NSTEMI, no bolus was requested by the EDP starting the heparin, Hgb 11.6, SCr 1.20, other labs as above.   Goal of Therapy:  Heparin level 0.3-0.7 units/ml Monitor platelets by anticoagulation protocol: Yes   Plan:  -Continue heparin at 900 units/hr as started in ED -1400 HL -Daily CBC/HL -Monitor for  bleeding -F/U MD plans  Narda Bonds 10/23/2013,6:03 AM

## 2013-10-21 NOTE — ED Notes (Signed)
IV team at [facility] 

## 2013-10-21 NOTE — Progress Notes (Signed)
ANTICOAGULATION CONSULT NOTE - Initial Consult  Pharmacy Consult for Heparin  Indication: chest pain/ACS, NSTEMI  Patient Measurements: ~73 kg  Vital Signs: Temp: 98.2 F (36.8 C) (06/17 1200) Temp src: Oral (06/17 1200) BP: 115/43 mmHg (06/17 1200) Pulse Rate: 60 (06/17 1200)  Labs:  Recent Labs  10/19/2013 0345 11/03/2013 0352 10/17/2013 0915 10/26/2013 1350  HGB 10.2* 11.6*  --   --   HCT 32.0* 34.0*  --   --   PLT 213  --   --   --   HEPARINUNFRC  --   --   --  0.29*  CREATININE  --  1.20* 0.99  --   TROPONINI  --   --  6.18*  --     Medical History: Past Medical History  Diagnosis Date  . CAD (coronary artery disease)     Catheterization, January, 2007 patent grafts / hospitalization October 2 010 no MI  . Syncope 03/2009    ?orthostasis  . Allergic rhinitis   . History of colon polyps   . Diverticulosis of colon   . GERD (gastroesophageal reflux disease)   . Hyperlipidemia   . Abdominal pain, other specified site     motility disorder, chorinic functional, severe (Dr Olevia Perches). Possible OCD w/complusive use of enemas and laxatives; psychotic features 2011  . Positive H. pylori test 2911  . Psychosomatic disorder     w/GI fixation  . Anxiety   . Depression   . Stress incontinence, female   . Vitamin D deficiency   . Vitamin B12 deficiency   . Renal insufficiency   . UTI (urinary tract infection)   . Hypokalemia     mild  . Insomnia   . Carotid artery disease     Doppler November, 4742, 5-95% LICA, 63-87% R. ICA.Marland Kitchen increased velocity... possibly from serpentine vessel  . Ejection fraction     50-55%, no wall motion abnormalities, echo, November, 2010  . Edema   . Hemothorax on right   . Breast cancer 2011    Dr Humphrey Rolls- right breast    Assessment: 80 y/o F to start heparin for NSTEMI, no bolus was requested by the EDP starting the heparin. Cath planned for tomorrow. HL came back only slightly low.   Goal of Therapy:  Heparin level 0.3-0.7  units/ml Monitor platelets by anticoagulation protocol: Yes   Plan:   Increase heparin to 1000 units/hr F/u with 8 hr level Daily level and CBC

## 2013-10-21 NOTE — ED Notes (Signed)
Cardiology at [facility]

## 2013-10-21 NOTE — ED Notes (Signed)
Duplicate order discontinued.  

## 2013-10-21 NOTE — ED Notes (Signed)
MD aware of pt's lab work and informed of this RN's assessment.

## 2013-10-21 NOTE — Progress Notes (Addendum)
Subjective:  Communication difficult secondary to language barrier ([place]), daughter coming in to translate  Objective:  Temp:  [98.1 F (36.7 C)-98.5 F (36.9 C)] 98.2 F (36.8 C) (06/17 1200) Pulse Rate:  [60-77] 60 (06/17 1200) Resp:  [16-27] 26 (06/17 1200) BP: (115-151)/(43-63) 115/43 mmHg (06/17 1200) SpO2:  [93 %-100 %] 96 % (06/17 1200) Weight:  [146 lb 9.7 oz (66.5 kg)] 146 lb 9.7 oz (66.5 kg) (06/17 0700) Weight change:   Intake/Output from previous day: 06/16 0701 - 06/17 0700 In: 8.9 [I.V.:8.9] Out: 50 [Urine:50]  Intake/Output from this shift: Total I/O In: 45 [I.V.:45] Out: -   Physical Exam: General appearance: alert and no distress Neck: no adenopathy, no carotid bruit, no JVD, supple, symmetrical, trachea midline and thyroid not enlarged, symmetric, no tenderness/mass/nodules Lungs: clear to auscultation bilaterally Heart: regular rate and rhythm, S1, S2 normal, no murmur, click, rub or gallop Extremities: extremities normal, atraumatic, no cyanosis or edema  Lab Results: Results for orders placed during the hospital encounter of 11/02/2013 (from the past 48 hour(s))  CBC WITH DIFFERENTIAL     Status: Abnormal   Collection Time    10/27/2013  3:45 AM      Result Value Ref Range   WBC 12.2 (*) 4.0 - 10.5 K/uL   RBC 3.38 (*) 3.87 - 5.11 MIL/uL   Hemoglobin 10.2 (*) 12.0 - 15.0 g/dL   HCT 32.0 (*) 36.0 - 46.0 %   MCV 94.7  78.0 - 100.0 fL   MCH 30.2  26.0 - 34.0 pg   MCHC 31.9  30.0 - 36.0 g/dL   RDW 13.2  11.5 - 15.5 %   Platelets 213  150 - 400 K/uL   Neutrophils Relative % 74  43 - 77 %   Neutro Abs 9.0 (*) 1.7 - 7.7 K/uL   Lymphocytes Relative 11 (*) 12 - 46 %   Lymphs Abs 1.3  0.7 - 4.0 K/uL   Monocytes Relative 14 (*) 3 - 12 %   Monocytes Absolute 1.7 (*) 0.1 - 1.0 K/uL   Eosinophils Relative 1  0 - 5 %   Eosinophils Absolute 0.2  0.0 - 0.7 K/uL   Basophils Relative 0  0 - 1 %   Basophils Absolute 0.0  0.0 - 0.1 K/uL  I-STAT  TROPOININ, ED     Status: Abnormal   Collection Time    10/27/2013  3:50 AM      Result Value Ref Range   Troponin i, poc 6.70 (*) 0.00 - 0.08 ng/mL   Comment NOTIFIED PHYSICIAN     Comment 3            Comment: Due to the release kinetics of cTnI,     a negative result within the first hours     of the onset of symptoms does not rule out     myocardial infarction with certainty.     If myocardial infarction is still suspected,     repeat the test at appropriate intervals.  MAGNESIUM     Status: None   Collection Time    10/11/2013  3:50 AM      Result Value Ref Range   Magnesium 2.2  1.5 - 2.5 mg/dL  I-STAT CHEM 8, ED     Status: Abnormal   Collection Time    10/13/2013  3:52 AM      Result Value Ref Range   Sodium 134 (*) 137 - 147 mEq/L   Potassium  2.8 (*) 3.7 - 5.3 mEq/L   Chloride 94 (*) 96 - 112 mEq/L   BUN 21  6 - 23 mg/dL   Creatinine, Ser 1.20 (*) 0.50 - 1.10 mg/dL   Glucose, Bld 133 (*) 70 - 99 mg/dL   Calcium, Ion 1.13  1.13 - 1.30 mmol/L   TCO2 31  0 - 100 mmol/L   Hemoglobin 11.6 (*) 12.0 - 15.0 g/dL   HCT 34.0 (*) 36.0 - 46.0 %   Comment NOTIFIED PHYSICIAN    I-STAT CG4 LACTIC ACID, ED     Status: None   Collection Time    10/05/2013  4:38 AM      Result Value Ref Range   Lactic Acid, Venous 1.32  0.5 - 2.2 mmol/L  URINALYSIS, ROUTINE W REFLEX MICROSCOPIC     Status: None   Collection Time    10/27/2013  5:19 AM      Result Value Ref Range   Color, Urine YELLOW  YELLOW   APPearance CLEAR  CLEAR   Specific Gravity, Urine 1.018  1.005 - 1.030   pH 5.5  5.0 - 8.0   Glucose, UA NEGATIVE  NEGATIVE mg/dL   Hgb urine dipstick NEGATIVE  NEGATIVE   Bilirubin Urine NEGATIVE  NEGATIVE   Ketones, ur NEGATIVE  NEGATIVE mg/dL   Protein, ur NEGATIVE  NEGATIVE mg/dL   Urobilinogen, UA 0.2  0.0 - 1.0 mg/dL   Nitrite NEGATIVE  NEGATIVE   Leukocytes, UA NEGATIVE  NEGATIVE   Comment: MICROSCOPIC NOT DONE ON URINES WITH NEGATIVE PROTEIN, BLOOD, LEUKOCYTES, NITRITE, OR GLUCOSE  <1000 mg/dL.  MRSA PCR SCREENING     Status: None   Collection Time    10/15/2013  7:25 AM      Result Value Ref Range   MRSA by PCR NEGATIVE  NEGATIVE   Comment:            The GeneXpert MRSA Assay (FDA     approved for NASAL specimens     only), is one component of a     comprehensive MRSA colonization     surveillance program. It is not     intended to diagnose MRSA     infection nor to guide or     monitor treatment for     MRSA infections.  BASIC METABOLIC PANEL     Status: Abnormal   Collection Time    10/28/2013  9:15 AM      Result Value Ref Range   Sodium 136 (*) 137 - 147 mEq/L   Potassium 3.5 (*) 3.7 - 5.3 mEq/L   Comment: NO VISIBLE HEMOLYSIS   Chloride 94 (*) 96 - 112 mEq/L   CO2 31  19 - 32 mEq/L   Glucose, Bld 123 (*) 70 - 99 mg/dL   BUN 21  6 - 23 mg/dL   Creatinine, Ser 0.99  0.50 - 1.10 mg/dL   Calcium 8.8  8.4 - 10.5 mg/dL   GFR calc non Af Amer 51 (*) >90 mL/min   GFR calc Af Amer 59 (*) >90 mL/min   Comment: (NOTE)     The eGFR has been calculated using the CKD EPI equation.     This calculation has not been validated in all clinical situations.     eGFR's persistently <90 mL/min signify possible Chronic Kidney     Disease.  TROPONIN I     Status: Abnormal   Collection Time    10/22/2013  9:15 AM  Result Value Ref Range   Troponin I 6.18 (*) <0.30 ng/mL   Comment:            Due to the release kinetics of cTnI,     a negative result within the first hours     of the onset of symptoms does not rule out     myocardial infarction with certainty.     If myocardial infarction is still suspected,     repeat the test at appropriate intervals.     CRITICAL RESULT CALLED TO, READ BACK BY AND VERIFIED WITH:     STRAMOSKI,P RN @ 5329 10/09/2013 LEONARD,A    Imaging: Imaging results have been reviewed  Assessment/Plan:   1. Principal Problem: 2.   NSTEMI (non-ST elevated myocardial infarction) 3. Active Problems: 4.   Syncope 5.   Hx of  CABG 6.   Time Spent Directly with Patient:  20 minutes  Length of Stay:  LOS: 0 days   Pt admitted with CP and syncope. H/O CAD s/p remote CABG. Admitted 2 weeks ago with ACS Rx medically. At that time she had anterolateral TWI. She has had several days of SSCP. Syncope this AM. Head CT neg. EKG shows normalization of TWI. Enz +. Trop 6. Currently pain free on iv Hep. Will get daughter in to discuss treatment plan. Cycle enz. Needs cor angio. Scheduled tomorrow first case with Dr., [facility].   Lorretta Harp 10/08/2013, 12:45 PM  Addendum- Discussed situation with daughter and pt. They both agree to cath tomorrow ab.  Lorretta Harp, M.D., [place], [facility], [place], [place] [address] [place], [place]  [address]  [phone] 10/18/2013 2:24 PM

## 2013-10-21 NOTE — ED Notes (Signed)
EMS-pt is from [facility]. Staff reports that patient got up to use the restroom and grabbed her chest and had syncopal episode. Staff reports patient was unresponsive. On EMS arrival patient lethargic but alert and oriented. CBG 161. pts only complaint is 5/10 pain to left head above eyebrow, daughter reports this is a new bruise. Pt speaks [place] and daughter is at bedside for interpretation.

## 2013-10-22 ENCOUNTER — Encounter (HOSPITAL_COMMUNITY): Admission: EM | Disposition: E | Payer: Medicare Other | Source: Home / Self Care | Attending: Interventional Cardiology

## 2013-10-22 ENCOUNTER — Ambulatory Visit: Payer: Medicare Other | Admitting: Internal Medicine

## 2013-10-22 DIAGNOSIS — I214 Non-ST elevation (NSTEMI) myocardial infarction: Principal | ICD-10-CM

## 2013-10-22 DIAGNOSIS — Z951 Presence of aortocoronary bypass graft: Secondary | ICD-10-CM

## 2013-10-22 DIAGNOSIS — I25810 Atherosclerosis of coronary artery bypass graft(s) without angina pectoris: Secondary | ICD-10-CM

## 2013-10-22 HISTORY — PX: LEFT HEART CATHETERIZATION WITH CORONARY/GRAFT ANGIOGRAM: SHX5450

## 2013-10-22 LAB — BASIC METABOLIC PANEL
BUN: 18 mg/dL (ref 6–23)
CALCIUM: 9.3 mg/dL (ref 8.4–10.5)
CO2: 30 mEq/L (ref 19–32)
CREATININE: 0.99 mg/dL (ref 0.50–1.10)
Chloride: 94 mEq/L — ABNORMAL LOW (ref 96–112)
GFR calc Af Amer: 59 mL/min — ABNORMAL LOW (ref >90)
GFR, EST NON AFRICAN AMERICAN: 51 mL/min — AB (ref >90)
GLUCOSE: 169 mg/dL — AB (ref 70–99)
Potassium: 3.6 mEq/L — ABNORMAL LOW (ref 3.7–5.3)
Sodium: 137 mEq/L (ref 137–147)

## 2013-10-22 LAB — CBC
HCT: 27.9 % — ABNORMAL LOW (ref 36.0–46.0)
HCT: 30.7 % — ABNORMAL LOW (ref 36.0–46.0)
Hemoglobin: 9.3 g/dL — ABNORMAL LOW (ref 12.0–15.0)
Hemoglobin: 10.3 g/dL — ABNORMAL LOW (ref 12.0–15.0)
MCH: 31.2 pg (ref 26.0–34.0)
MCH: 31.2 pg (ref 26.0–34.0)
MCHC: 33.3 g/dL (ref 30.0–36.0)
MCHC: 33.6 g/dL (ref 30.0–36.0)
MCV: 93.6 fL (ref 78.0–100.0)
MCV: 93 fL (ref 78.0–100.0)
PLATELETS: 240 10*3/uL (ref 150–400)
Platelets: 222 10*3/uL (ref 150–400)
RBC: 2.98 MIL/uL — ABNORMAL LOW (ref 3.87–5.11)
RBC: 3.3 MIL/uL — ABNORMAL LOW (ref 3.87–5.11)
RDW: 13 % (ref 11.5–15.5)
RDW: 13.1 % (ref 11.5–15.5)
WBC: 11.5 10*3/uL — ABNORMAL HIGH (ref 4.0–10.5)
WBC: 12.7 10*3/uL — ABNORMAL HIGH (ref 4.0–10.5)

## 2013-10-22 LAB — POCT ACTIVATED CLOTTING TIME: Activated Clotting Time: 444 seconds

## 2013-10-22 LAB — PROTIME-INR
INR: 1.1 (ref 0.00–1.49)
Prothrombin Time: 14 seconds (ref 11.6–15.2)

## 2013-10-22 LAB — HEPARIN LEVEL (UNFRACTIONATED): HEPARIN UNFRACTIONATED: 0.35 [IU]/mL (ref 0.30–0.70)

## 2013-10-22 SURGERY — LEFT HEART CATHETERIZATION WITH CORONARY/GRAFT ANGIOGRAM
Anesthesia: LOCAL

## 2013-10-22 MED ORDER — HEPARIN SODIUM (PORCINE) 1000 UNIT/ML IJ SOLN
INTRAMUSCULAR | Status: AC
  Filled 2013-10-22: qty 1

## 2013-10-22 MED ORDER — DIPHENHYDRAMINE HCL 50 MG/ML IJ SOLN
25.0000 mg | INTRAMUSCULAR | Status: AC
  Administered 2013-10-22: 25 mg via INTRAVENOUS
  Filled 2013-10-22: qty 1

## 2013-10-22 MED ORDER — FAMOTIDINE IN NACL 20-0.9 MG/50ML-% IV SOLN
20.0000 mg | INTRAVENOUS | Status: AC
  Administered 2013-10-22: 20 mg via INTRAVENOUS
  Filled 2013-10-22: qty 50

## 2013-10-22 MED ORDER — BIVALIRUDIN 250 MG IV SOLR
250.0000 mg | Freq: Once | INTRAVENOUS | Status: DC
Start: 2013-10-22 — End: 2013-10-22
  Filled 2013-10-22: qty 250

## 2013-10-22 MED ORDER — SODIUM CHLORIDE 0.9 % IV SOLN: 1.0000 mL/kg/h | INTRAVENOUS | Status: AC

## 2013-10-22 MED ORDER — SODIUM CHLORIDE 0.9 % IV SOLN
250.0000 mL | INTRAVENOUS | Status: DC | PRN
  Administered 2013-10-23: 13:00:00 via INTRAVENOUS
  Administered 2013-10-25 (×2): 100 mL via INTRAVENOUS

## 2013-10-22 MED ORDER — CLOPIDOGREL BISULFATE 75 MG PO TABS
ORAL_TABLET | ORAL | Status: AC
  Filled 2013-10-22: qty 1

## 2013-10-22 MED ORDER — TICAGRELOR 90 MG PO TABS
ORAL_TABLET | ORAL | Status: AC
  Filled 2013-10-22: qty 1

## 2013-10-22 MED ORDER — SODIUM CHLORIDE 0.9 % IV SOLN
0.1000 mg/kg/h | INTRAVENOUS | Status: AC
  Filled 2013-10-22: qty 250

## 2013-10-22 MED ORDER — LIDOCAINE HCL (PF) 1 % IJ SOLN
INTRAMUSCULAR | Status: AC
  Filled 2013-10-22: qty 30

## 2013-10-22 MED ORDER — VERAPAMIL HCL 2.5 MG/ML IV SOLN
INTRAVENOUS | Status: AC
  Filled 2013-10-22: qty 2

## 2013-10-22 MED ORDER — METHYLPREDNISOLONE SODIUM SUCC 125 MG IJ SOLR
125.0000 mg | INTRAMUSCULAR | Status: AC
Start: 2013-10-22 — End: 2013-10-22
  Administered 2013-10-22: 125 mg via INTRAVENOUS

## 2013-10-22 MED ORDER — METHYLPREDNISOLONE SODIUM SUCC 125 MG IJ SOLR
INTRAMUSCULAR | Status: AC
  Filled 2013-10-22: qty 2

## 2013-10-22 MED ORDER — MIDAZOLAM HCL 2 MG/2ML IJ SOLN
INTRAMUSCULAR | Status: AC
  Filled 2013-10-22: qty 2

## 2013-10-22 MED ORDER — HEPARIN SODIUM (PORCINE) 5000 UNIT/ML IJ SOLN
5000.0000 [IU] | Freq: Three times a day (TID) | INTRAMUSCULAR | Status: DC
  Administered 2013-10-22 – 2013-10-25 (×8): 5000 [IU] via SUBCUTANEOUS
  Filled 2013-10-22 (×11): qty 1

## 2013-10-22 MED ORDER — FENTANYL CITRATE 0.05 MG/ML IJ SOLN
INTRAMUSCULAR | Status: AC
  Filled 2013-10-22: qty 2

## 2013-10-22 MED ORDER — HEPARIN (PORCINE) IN NACL 2-0.9 UNIT/ML-% IJ SOLN
INTRAMUSCULAR | Status: AC
  Filled 2013-10-22: qty 1000

## 2013-10-22 MED ORDER — BIVALIRUDIN 250 MG IV SOLR
INTRAVENOUS | Status: AC
  Filled 2013-10-22: qty 250

## 2013-10-22 MED ORDER — SODIUM CHLORIDE 0.9 % IJ SOLN
3.0000 mL | Freq: Two times a day (BID) | INTRAMUSCULAR | Status: DC
  Administered 2013-10-23 – 2013-10-27 (×8): 3 mL via INTRAVENOUS

## 2013-10-22 MED ORDER — NITROGLYCERIN 0.2 MG/ML ON CALL CATH LAB
INTRAVENOUS | Status: AC
  Filled 2013-10-22: qty 1

## 2013-10-22 MED ORDER — SODIUM CHLORIDE 0.9 % IJ SOLN
3.0000 mL | INTRAMUSCULAR | Status: DC | PRN
  Administered 2013-10-24: 3 mL via INTRAVENOUS

## 2013-10-22 NOTE — CV Procedure (Addendum)
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 Her last heart cath was 2007 and grafts were patent, LAD 90%, circumflex 80%, and RCA occluded. Admitted here two weeks ago with UTI, confusion. Seen by Cardiology at that time for NSVT (9 beat run in ED). EKG with prominent lateral T-wave changes but had normal troponin. Echo showed small wall motion abnormality but NSVT did not recur and there was no chest pain. BB increased, and no further eval recommended.  She was discharged to SNF on 6/7. She is [place] speaking only so history is translated through daughter on admission, and currently with an interpreter. She states she has had CP on and off for 3 days. Today, after using the bathroom, she had a syncopal event from a standing position. EMS report states patient was observed to clutch her chest but patient does not recall this. She hit her head but CT scan negative for bleed. Troponin i in ED 6.7.  She is now referred for invasive evaluation cardiac catheterization plus minus PCI.  Informed consent was obtained using the interpreter.  PRE-OPERATIVE DIAGNOSIS:    NSTEMI  KNOWN CAD-CABG  PROCEDURES PERFORMED:    Left Heart Catheterization with Native Coronary and Graft Angiography via Left Radial & Right Common Femoary Artery   Percutaneous Coronary Intervention on mid SVG-Diag - Rebel BMS 3.5 mm x 15 mm (3.8)  Percutaneous Coronary Angioplasty   PROCEDURE: The patient  was brought to the [facility] in the fasting state and prepped and draped in the usual sterile fashion for Left Radial & Right Common Femoral artery access. A modified Allen's test was performed on the Left wrist demonstrating excellent collateral flow for radial access.   Sterile technique was used including antiseptics, cap, gloves, gown, hand hygiene, mask and sheet. Skin prep: Chlorhexidine.   Consent: Risks of procedure as well as the alternatives and risks of each were explained to the (patient/caregiver). Consent for procedure obtained.   Time Out: Verified patient identification, verified procedure, site/side was marked, verified correct patient position, special equipment/implants available, medications/allergies/relevent history reviewed, required imaging and test results available. Performed.  Access:   Left Radial Artery: 6 Fr Sheath -  Seldinger Technique (Angiocath Micropuncture Kit)  IV Heparin 4000 Units  Right Common Artery: 6 Fr Sheath -  fluoroscopically guided modified Seldinger Technique   Left Heart Catheterization: 5 Fr Catheters advanced or exchanged over a Long Exchange Safety J-wire  Right Coronary Artery, SVG-OM, SVG-Diag Cineangiography: JR 4,  After Cathter exchange with an AL1 for better views of SVG-RCA & Native LCA, the patient had notable brachial artery spasm - unable to push catheter to the Aortic Root -- Radial approach Abandoned. -- R Femoral Artery Access. Left Coronary Artery & LIMA-LAD Cineangiography: JL4 Catheter   SVG-RCA Cineangiography: JR4 Catheter   LV Hemodynamics (LV Gram): Angled Pigtail  Femoral Sheath to be removed in the TCU with manual pressure for hemostasis.  TR Band: 1605  Hours; 13 mL air applied during Femoral access -  initially 16 mL - now 13 ml  FINDINGS:  Hemodynamics:   Central Aortic Pressure / Mean: 147/63/94 mmHg  Left Ventricular Pressure / LVEDP: 146/4/21 mmHg  Left  Ventriculography: Deferred  Coronary Anatomy:  Dominance: Right  Left Main: Large-caliber vessel that bifurcates distally into the circumflex which is 100 and occluded in the LAD which is occluded in the mid vessel after giving rise to a major septal perforator and a small diagonal branch.   RCA: Large caliber, dominant vessel, occluded in the mid vessel.  Graft Anatomy:   LIMA-LAD: Normal caliber vessel widely patent to the mid LAD. Downstream flow in the LAD shows minimal luminal irregularities. Retrograde flow goes up to a small diagonal branch.  SVG-RPDA: Large-caliber vessel with supple every areas of ectasia whirling flow. Distally the vessel has significant ectasia right before the anastomosis where there is a 95% stenosis just prior to the anastomosis going into the anastomosis. Antegrade flow goes down small posterior descending artery with retrograde flow going back up to the posterior lateral system. No significant disease in the target vessel.  SVG-OM: Large-caliber also ectatic vessel with widely patent to a moderate caliber OM branch. Minimal luminal irregularities.  SVG-DIAG: Large-caliber vessel with a mid 80% eccentric lesion point to a smaller moderate caliber target diagonal branch. The remainder the vessel is relatively free of disease.   After reviewing the initial angiography, the culprit lesions were thought to be either the SVG-diagonal or the distal SVG-RPDA.  Preparation were made to proceed with PCI on these lesions. The PCI will be performed via femoral approach  Percutaneous Coronary Intervention:  Sheath exchanged for 6 Fr  Lesion 1:80% mid SVG-diagonal reduced to 0%. TIMI 3 flow pre-and post Guide: 6 Fr AL0.75 Guidewire: Pro-water Predilation Balloon: Trek 2.5 mm x 12 mm;   10 Atm x 23 Sec Stent:  BS Rebel BMS 3.5 mm x 16 mm;   Max Inflation 18 Atm x 30 Sec Post-dilation Balloon: [place] Treck 3.75 mm x 12 mm;   14 Atm x 32 Sec  Final Diameter: 3.8  mm  Attention was then turned to the SVG-RPDA  Guide: 6 Fr   Al 0.75 Guidewire: Pro-water Predilation Balloon: Trek 2.5 mm x 12 mm; ;   10 Atm x 30 Sec,  Angiosculpt Scoring Balloon: 2.5 mm x 10 mm;   12 Atm x 30 Sec -- 2.5 mm at the anastomosis.    Post deployment angiography in multiple views, with and without guidewire in place revealed excellent stent deployment and lesion coverage.  There was no evidence of dissection or perforation.  MEDICATIONS:  Anesthesia:  Local Lidocaine 2 ml, 15 mL femoral  Sedation:  2 mg IV Versed, 50 mcg IV fentanyl ;   Premedication: Solumedrol 125 mg,. Benadryl 25 mg;   Omnipaque Contrast: 250 ml  Anticoagulation:  IV Heparin 4000 Units ; Angiomax Bolus & drip  Anti-Platelet Agent:  Plavix 75 mg ( but when we learned that 300 mg not given yesterday -- Brilinta 180 mg given post PCI Radial Cocktail: 5 mg Verapamil, 400 mcg NTG, 2 ml 2% Lidocaine in 10 ml NS + 5 mg IV Verapamil  PATIENT DISPOSITION:    The patient was transferred to the PACU holding area in a hemodynamicaly stable, chest pain free condition.  The patient tolerated the procedure well, and there were no complications.  EBL:   < 10 ml  The patient was stable before, during, and after the procedure.  POST-OPERATIVE DIAGNOSIS:  Severe disease in 2 vein grafts - SVG-diagonal, SVG-RCA with widely patent LIMA-LAD and SVG-OM.  Severe native coronary disease with occluded RCA, LAD and Circumflex  Moderately elevated LVEDP  PLAN OF CARE:  Return to TCU for post catheterization care. She will need weaning of the TR band as well as sheath removal of the femoral access  Will right Angiomax for 4 hours, since she was not adequately loaded with an oral antiplatelet agent prior to the procedure. Only 75 mg of Plavix was swallow successfully prior to PCI. She was given 180 mg Brilinta then will be converted back to Plavix the morning.   With just a transferred to the floor tomorrow  with possible discharge on Saturday morning.  A bare-metal stent was used, therefore Plavix for a minimum of 3 months.   Leonie Man, M.D., M.S. [facility] [address] [place], [place]  [address]  [phone]  10/18/2013 3:47 PM

## 2013-10-22 NOTE — Progress Notes (Signed)
Subjective:  No CP/SOB. For cath today  Objective:  Temp:  [97.9 F (36.6 C)-99.5 F (37.5 C)] 97.9 F (36.6 C) (06/18 1129) Pulse Rate:  [60-87] 71 (06/18 1129) Resp:  [15-27] 20 (06/18 1129) BP: (90-149)/(43-67) 137/67 mmHg (06/18 1129) SpO2:  [93 %-98 %] 96 % (06/18 1129) Weight:  [146 lb 6.2 oz (66.4 kg)] 146 lb 6.2 oz (66.4 kg) (06/18 0332) Weight change: -3.5 oz (-0.1 kg)  Intake/Output from previous day: 06/17 0701 - 06/18 0700 In: 622.5 [P.O.:180; I.V.:392.5; IV Piggyback:50] Out: 1425 [Urine:1425]  Intake/Output from this shift: Total I/O In: -  Out: 735 [Urine:735]  Physical Exam: General appearance: alert and no distress Neck: no adenopathy, no carotid bruit, no JVD, supple, symmetrical, trachea midline and thyroid not enlarged, symmetric, no tenderness/mass/nodules Lungs: clear to auscultation bilaterally Heart: regular rate and rhythm, S1, S2 normal, no murmur, click, rub or gallop Extremities: extremities normal, atraumatic, no cyanosis or edema  Lab Results: Results for orders placed during the hospital encounter of 10/30/2013 (from the past 48 hour(s))  CBC WITH DIFFERENTIAL     Status: Abnormal   Collection Time    10/10/2013  3:45 AM      Result Value Ref Range   WBC 12.2 (*) 4.0 - 10.5 K/uL   RBC 3.38 (*) 3.87 - 5.11 MIL/uL   Hemoglobin 10.2 (*) 12.0 - 15.0 g/dL   HCT 32.0 (*) 36.0 - 46.0 %   MCV 94.7  78.0 - 100.0 fL   MCH 30.2  26.0 - 34.0 pg   MCHC 31.9  30.0 - 36.0 g/dL   RDW 13.2  11.5 - 15.5 %   Platelets 213  150 - 400 K/uL   Neutrophils Relative % 74  43 - 77 %   Neutro Abs 9.0 (*) 1.7 - 7.7 K/uL   Lymphocytes Relative 11 (*) 12 - 46 %   Lymphs Abs 1.3  0.7 - 4.0 K/uL   Monocytes Relative 14 (*) 3 - 12 %   Monocytes Absolute 1.7 (*) 0.1 - 1.0 K/uL   Eosinophils Relative 1  0 - 5 %   Eosinophils Absolute 0.2  0.0 - 0.7 K/uL   Basophils Relative 0  0 - 1 %   Basophils Absolute 0.0  0.0 - 0.1 K/uL  I-STAT TROPOININ, ED     Status:  Abnormal   Collection Time    10/18/2013  3:50 AM      Result Value Ref Range   Troponin i, poc 6.70 (*) 0.00 - 0.08 ng/mL   Comment NOTIFIED PHYSICIAN     Comment 3            Comment: Due to the release kinetics of cTnI,     a negative result within the first hours     of the onset of symptoms does not rule out     myocardial infarction with certainty.     If myocardial infarction is still suspected,     repeat the test at appropriate intervals.  MAGNESIUM     Status: None   Collection Time    11/02/2013  3:50 AM      Result Value Ref Range   Magnesium 2.2  1.5 - 2.5 mg/dL  I-STAT CHEM 8, ED     Status: Abnormal   Collection Time    10/29/2013  3:52 AM      Result Value Ref Range   Sodium 134 (*) 137 - 147 mEq/L   Potassium 2.8 (*)  3.7 - 5.3 mEq/L   Chloride 94 (*) 96 - 112 mEq/L   BUN 21  6 - 23 mg/dL   Creatinine, Ser 1.20 (*) 0.50 - 1.10 mg/dL   Glucose, Bld 133 (*) 70 - 99 mg/dL   Calcium, Ion 1.13  1.13 - 1.30 mmol/L   TCO2 31  0 - 100 mmol/L   Hemoglobin 11.6 (*) 12.0 - 15.0 g/dL   HCT 34.0 (*) 36.0 - 46.0 %   Comment NOTIFIED PHYSICIAN    I-STAT CG4 LACTIC ACID, ED     Status: None   Collection Time    10/10/2013  4:38 AM      Result Value Ref Range   Lactic Acid, Venous 1.32  0.5 - 2.2 mmol/L  URINALYSIS, ROUTINE W REFLEX MICROSCOPIC     Status: None   Collection Time    10/26/2013  5:19 AM      Result Value Ref Range   Color, Urine YELLOW  YELLOW   APPearance CLEAR  CLEAR   Specific Gravity, Urine 1.018  1.005 - 1.030   pH 5.5  5.0 - 8.0   Glucose, UA NEGATIVE  NEGATIVE mg/dL   Hgb urine dipstick NEGATIVE  NEGATIVE   Bilirubin Urine NEGATIVE  NEGATIVE   Ketones, ur NEGATIVE  NEGATIVE mg/dL   Protein, ur NEGATIVE  NEGATIVE mg/dL   Urobilinogen, UA 0.2  0.0 - 1.0 mg/dL   Nitrite NEGATIVE  NEGATIVE   Leukocytes, UA NEGATIVE  NEGATIVE   Comment: MICROSCOPIC NOT DONE ON URINES WITH NEGATIVE PROTEIN, BLOOD, LEUKOCYTES, NITRITE, OR GLUCOSE <1000 mg/dL.  MRSA PCR  SCREENING     Status: None   Collection Time    10/18/2013  7:25 AM      Result Value Ref Range   MRSA by PCR NEGATIVE  NEGATIVE   Comment:            The GeneXpert MRSA Assay (FDA     approved for NASAL specimens     only), is one component of a     comprehensive MRSA colonization     surveillance program. It is not     intended to diagnose MRSA     infection nor to guide or     monitor treatment for     MRSA infections.  BASIC METABOLIC PANEL     Status: Abnormal   Collection Time    10/20/2013  9:15 AM      Result Value Ref Range   Sodium 136 (*) 137 - 147 mEq/L   Potassium 3.5 (*) 3.7 - 5.3 mEq/L   Comment: NO VISIBLE HEMOLYSIS   Chloride 94 (*) 96 - 112 mEq/L   CO2 31  19 - 32 mEq/L   Glucose, Bld 123 (*) 70 - 99 mg/dL   BUN 21  6 - 23 mg/dL   Creatinine, Ser 0.99  0.50 - 1.10 mg/dL   Calcium 8.8  8.4 - 10.5 mg/dL   GFR calc non Af Amer 51 (*) >90 mL/min   GFR calc Af Amer 59 (*) >90 mL/min   Comment: (NOTE)     The eGFR has been calculated using the CKD EPI equation.     This calculation has not been validated in all clinical situations.     eGFR's persistently <90 mL/min signify possible Chronic Kidney     Disease.  TROPONIN I     Status: Abnormal   Collection Time    10/24/2013  9:15 AM      Result  Value Ref Range   Troponin I 6.18 (*) <0.30 ng/mL   Comment:            Due to the release kinetics of cTnI,     a negative result within the first hours     of the onset of symptoms does not rule out     myocardial infarction with certainty.     If myocardial infarction is still suspected,     repeat the test at appropriate intervals.     CRITICAL RESULT CALLED TO, READ BACK BY AND VERIFIED WITH:     STRAMOSKI,P RN @ 1020 10/17/2013 LEONARD,A  HEPARIN LEVEL (UNFRACTIONATED)     Status: Abnormal   Collection Time    10/23/2013  1:50 PM      Result Value Ref Range   Heparin Unfractionated 0.29 (*) 0.30 - 0.70 IU/mL   Comment:            IF HEPARIN RESULTS ARE BELOW      EXPECTED VALUES, AND PATIENT     DOSAGE HAS BEEN CONFIRMED,     SUGGEST FOLLOW UP TESTING     OF ANTITHROMBIN III LEVELS.  HEPARIN LEVEL (UNFRACTIONATED)     Status: Abnormal   Collection Time    10/08/2013 11:06 PM      Result Value Ref Range   Heparin Unfractionated 0.19 (*) 0.30 - 0.70 IU/mL   Comment:            IF HEPARIN RESULTS ARE BELOW     EXPECTED VALUES, AND PATIENT     DOSAGE HAS BEEN CONFIRMED,     SUGGEST FOLLOW UP TESTING     OF ANTITHROMBIN III LEVELS.  CBC     Status: Abnormal   Collection Time    10/21/2013  2:00 AM      Result Value Ref Range   WBC 11.5 (*) 4.0 - 10.5 K/uL   RBC 2.98 (*) 3.87 - 5.11 MIL/uL   Hemoglobin 9.3 (*) 12.0 - 15.0 g/dL   Comment: DELTA CHECK NOTED     REPEATED TO VERIFY   HCT 27.9 (*) 36.0 - 46.0 %   MCV 93.6  78.0 - 100.0 fL   MCH 31.2  26.0 - 34.0 pg   MCHC 33.3  30.0 - 36.0 g/dL   RDW 13.0  11.5 - 15.5 %   Platelets 222  150 - 400 K/uL  HEPARIN LEVEL (UNFRACTIONATED)     Status: None   Collection Time    11/03/2013  2:00 AM      Result Value Ref Range   Heparin Unfractionated 0.35  0.30 - 0.70 IU/mL   Comment:            IF HEPARIN RESULTS ARE BELOW     EXPECTED VALUES, AND PATIENT     DOSAGE HAS BEEN CONFIRMED,     SUGGEST FOLLOW UP TESTING     OF ANTITHROMBIN III LEVELS.  PROTIME-INR     Status: None   Collection Time    10/29/2013  2:00 AM      Result Value Ref Range   Prothrombin Time 14.0  11.6 - 15.2 seconds   INR 1.10  0.00 - 1.49    Imaging: Imaging results have been reviewed  Assessment/Plan:   1. Principal Problem: 2.   NSTEMI (non-ST elevated myocardial infarction) 3. Active Problems: 4.   Syncope 5.   Hx of CABG 6.   Time Spent Directly with Patient:  15 minutes  Length of  Stay:  LOS: 1 day   On IV hep. For cath today. Hgb has dropped 2 grams. K repleted. Will recheck CBC/BMET. Exam benign. Check stool guiacs.    Lorretta Harp 10/08/2013, 11:41 AM

## 2013-10-22 NOTE — Progress Notes (Signed)
ANTICOAGULATION CONSULT NOTE   Pharmacy Consult for Heparin  Indication: chest pain/ACS, NSTEMI  Patient Measurements: ~73 kg  Vital Signs: Temp: 99.4 F (37.4 C) (06/17 2000) Temp src: Oral (06/17 2000) BP: 96/52 mmHg (06/18 0011) Pulse Rate: 73 (06/18 0011)  Labs:  Recent Labs  10/10/2013 0345 10/27/2013 0352 11/01/2013 0915 10/27/2013 1350 10/08/2013 2306  HGB 10.2* 11.6*  --   --   --   HCT 32.0* 34.0*  --   --   --   PLT 213  --   --   --   --   HEPARINUNFRC  --   --   --  0.29* 0.19*  CREATININE  --  1.20* 0.99  --   --   TROPONINI  --   --  6.18*  --   --     Medical History: Past Medical History  Diagnosis Date  . CAD (coronary artery disease)     Catheterization, January, 2007 patent grafts / hospitalization October 2 010 no MI  . Syncope 03/2009    ?orthostasis  . Allergic rhinitis   . History of colon polyps   . Diverticulosis of colon   . GERD (gastroesophageal reflux disease)   . Hyperlipidemia   . Abdominal pain, other specified site     motility disorder, chorinic functional, severe (Dr Olevia Perches). Possible OCD w/complusive use of enemas and laxatives; psychotic features 2011  . Positive H. pylori test 2911  . Psychosomatic disorder     w/GI fixation  . Anxiety   . Depression   . Stress incontinence, female   . Vitamin D deficiency   . Vitamin B12 deficiency   . Renal insufficiency   . UTI (urinary tract infection)   . Hypokalemia     mild  . Insomnia   . Carotid artery disease     Doppler November, 7209, 4-70% LICA, 96-28% R. ICA.Marland Kitchen increased velocity... possibly from serpentine vessel  . Ejection fraction     50-55%, no wall motion abnormalities, echo, November, 2010  . Edema   . Hemothorax on right   . Breast cancer 2011    Dr Humphrey Rolls- right breast    Assessment: 80 y/o F to start heparin for NSTEMI, no bolus was requested by the EDP starting the heparin. Cath planned for tomorrow. HL now 0.19 units/ml.  No issues with infusion per RN  Goal of  Therapy:  Heparin level 0.3-0.7 units/ml Monitor platelets by anticoagulation protocol: Yes   Plan:   Increase heparin to 1150 units/hr F/u with 8 hr level Daily level and CBC  Thanks for allowing pharmacy to be a part of this patient's care.  Excell Seltzer, PharmD Clinical Pharmacist, [phone]

## 2013-10-22 NOTE — Interval H&P Note (Signed)
History and Physical Interval Note:  10/17/2013 1:31 PM  [name]  has presented today for Cardiac Catheterization, with the diagnosis of non stemi..  After a along discussion with the Daughter & patient, Dr. Gwenlyn Found has referred her for cardiac cahteterization +/- PCI.    The various methods of treatment have been discussed with the patient and family. After consideration of risks, benefits and other options for treatment, the patient has consented to  Procedure(s): LEFT HEART CATHETERIZATION WITH CORONARY/GRAFT ANGIOGRAM (N/A) as a surgical intervention .  The patient's history has been reviewed, patient examined, no change in status, stable for surgery.  I have reviewed the patient's chart and labs.  Questions were answered to the patient's satisfaction.     HARDING,DAVID W  Cath Lab Visit (complete for each Cath Lab visit)  Clinical Evaluation Leading to the Procedure:   ACS: yes  Non-ACS:    Anginal Classification: CCS III  Anti-ischemic medical therapy: Maximal Therapy (2 or more classes of medications)  Non-Invasive Test Results: No non-invasive testing performed  Prior CABG: Previous CABG

## 2013-10-22 NOTE — Progress Notes (Signed)
Pre-cath nurse noted patient has h/o dye allergy. Will order premedication with IV Solumedrol, Pepcid, Benadryl on call to cath per protocol. Dayna Dunn PA-C

## 2013-10-22 NOTE — Progress Notes (Signed)
Orthopedic Tech Progress Note Patient Details:  [name] January 16, 1928 [id number]  Ortho Devices Type of Ortho Device: Knee Immobilizer Ortho Device/Splint Location: RLE Ortho Device/Splint Interventions: Ordered;Application   Braulio Bosch 10/05/2013, 5:30 PM

## 2013-10-23 ENCOUNTER — Encounter (HOSPITAL_COMMUNITY): Payer: Self-pay | Admitting: Physician Assistant

## 2013-10-23 ENCOUNTER — Inpatient Hospital Stay (HOSPITAL_COMMUNITY): Payer: Medicare Other

## 2013-10-23 DIAGNOSIS — I2510 Atherosclerotic heart disease of native coronary artery without angina pectoris: Secondary | ICD-10-CM

## 2013-10-23 DIAGNOSIS — F411 Generalized anxiety disorder: Secondary | ICD-10-CM

## 2013-10-23 DIAGNOSIS — I209 Angina pectoris, unspecified: Secondary | ICD-10-CM

## 2013-10-23 LAB — CBC
HCT: 30.6 % — ABNORMAL LOW (ref 36.0–46.0)
Hemoglobin: 10 g/dL — ABNORMAL LOW (ref 12.0–15.0)
MCH: 30.4 pg (ref 26.0–34.0)
MCHC: 32.7 g/dL (ref 30.0–36.0)
MCV: 93 fL (ref 78.0–100.0)
PLATELETS: 279 10*3/uL (ref 150–400)
RBC: 3.29 MIL/uL — ABNORMAL LOW (ref 3.87–5.11)
RDW: 13.4 % (ref 11.5–15.5)
WBC: 18.7 10*3/uL — AB (ref 4.0–10.5)

## 2013-10-23 LAB — BASIC METABOLIC PANEL
BUN: 19 mg/dL (ref 6–23)
BUN: 19 mg/dL (ref 6–23)
CO2: 27 mEq/L (ref 19–32)
CO2: 11 meq/L — AB (ref 19–32)
CREATININE: 1.21 mg/dL — AB (ref 0.50–1.10)
Calcium: 9 mg/dL (ref 8.4–10.5)
Calcium: 8.2 mg/dL — ABNORMAL LOW (ref 8.4–10.5)
Chloride: 94 mEq/L — ABNORMAL LOW (ref 96–112)
Chloride: 97 mEq/L (ref 96–112)
Creatinine, Ser: 1.8 mg/dL — ABNORMAL HIGH (ref 0.50–1.10)
GFR calc Af Amer: 47 mL/min — ABNORMAL LOW (ref >90)
GFR calc non Af Amer: 25 mL/min — ABNORMAL LOW (ref >90)
GFR, EST AFRICAN AMERICAN: 29 mL/min — AB (ref >90)
GFR, EST NON AFRICAN AMERICAN: 40 mL/min — AB (ref >90)
GLUCOSE: 134 mg/dL — AB (ref 70–99)
Glucose, Bld: 255 mg/dL — ABNORMAL HIGH (ref 70–99)
Potassium: 3.4 mEq/L — ABNORMAL LOW (ref 3.7–5.3)
Potassium: 5.8 mEq/L — ABNORMAL HIGH (ref 3.7–5.3)
SODIUM: 135 meq/L — AB (ref 137–147)
Sodium: 134 mEq/L — ABNORMAL LOW (ref 137–147)

## 2013-10-23 LAB — URINALYSIS, ROUTINE W REFLEX MICROSCOPIC
Glucose, UA: NEGATIVE mg/dL
KETONES UR: 15 mg/dL — AB
NITRITE: POSITIVE — AB
Protein, ur: 30 mg/dL — AB
Specific Gravity, Urine: 1.015 (ref 1.005–1.030)
UROBILINOGEN UA: 0.2 mg/dL (ref 0.0–1.0)
pH: 5 (ref 5.0–8.0)

## 2013-10-23 LAB — GLUCOSE, CAPILLARY: GLUCOSE-CAPILLARY: 179 mg/dL — AB (ref 70–99)

## 2013-10-23 LAB — URINE MICROSCOPIC-ADD ON

## 2013-10-23 LAB — VALPROIC ACID LEVEL: Valproic Acid Lvl: <10 ug/mL — ABNORMAL LOW (ref 50.0–100.0)

## 2013-10-23 MED ORDER — SODIUM CHLORIDE 0.9 % IV BOLUS (SEPSIS)
250.0000 mL | Freq: Once | INTRAVENOUS | Status: AC
Start: 2013-10-23 — End: 2013-10-23
  Administered 2013-10-23: 250 mL via INTRAVENOUS

## 2013-10-23 MED ORDER — SODIUM CHLORIDE 0.9 % IV SOLN
INTRAVENOUS | Status: DC
  Administered 2013-10-23: 15:00:00 via INTRAVENOUS

## 2013-10-23 MED ORDER — POTASSIUM CHLORIDE CRYS ER 20 MEQ PO TBCR
20.0000 meq | EXTENDED_RELEASE_TABLET | Freq: Once | ORAL | Status: AC
  Administered 2013-10-23: 20 meq via ORAL
  Filled 2013-10-23: qty 1

## 2013-10-23 MED ORDER — ATROPINE SULFATE 0.1 MG/ML IJ SOLN
INTRAMUSCULAR | Status: AC
  Filled 2013-10-23: qty 10

## 2013-10-23 MED ORDER — DEXTROSE 5 % IV SOLN
1.0000 g | INTRAVENOUS | Status: DC
  Administered 2013-10-23 – 2013-10-24 (×2): 1 g via INTRAVENOUS
  Filled 2013-10-23 (×2): qty 10

## 2013-10-23 MED FILL — Sodium Chloride IV Soln 0.9%: INTRAVENOUS | Qty: 50 | Status: AC

## 2013-10-23 NOTE — Consult Note (Signed)
[facility] Medical Consultation  [name] VXY:[id number] DOB: Apr 01, 1928 DOA: 10/06/2013 PCP: Walker Kehr, MD   Requesting physician: Dr Stephani Police Date of consultation: 10/23/2013 Reason for consultation: Mental Status Changes  Impression/Recommendations Principal Problem:   NSTEMI (non-ST elevated myocardial infarction) Active Problems:   Syncope   Hx of CABG   1. Acute Encephalopathy. I suspect acute encephalopathy to likely to be multifactorial. It appears she had a similar presentation on a recent hospitalization. Labs were reviewed; her white count increased to 18.7 from 12.7, although she did receive IV steroids yesterday. I would nonetheless recommend assessing for the possibility of underlying infection (as UTI likely contributed to mental status changes on recent hospitalization.) Will order a repeat U/A as well as CXR. Additionally she is on multiple psychotropic medications that include methadone, risperdal, valproic acid, ambien and ativan. Will discontinue ambien, ativan and risperdal. Also recommend checking a valproic acid level. Creatinine slightingly trending up to 1.2 from 0.99. She hasn't had any PO today, it's possible she may be a little dehydrated and would recommend gentle IV fluids with close watch on her volume status.   I will followup again tomorrow. Please contact me if I can be of assistance in the meanwhile. Thank you for this consultation.  Chief Complaint: Mental Status Changes  HPI:  [name] is a pleasant 80 year old, non-english speaking patient, originally from [place], with history of cognitive impairment, CAD, breast cancer, who was recently discharged from the medicine service on 10/10/2013 at which time she was treated for acute encephalopathy likely secondary to multiple factors including infectious process from UTI and polypharmacy in setting of underlying dementia. She was discharged back to her SNF in stable condition.  She was transferred to the emergency room from her SNF on 10/13/2013 with complaints of chest pain and syncope. Initial CT scan of brain was negative. She was admitted to the cardiology service and had a cardiac catheterization on 10/15/2013, undergoing bare metal stenting to mid SVG-diagonal and PTCA to SVG-RPDA. Post procedure she became agitated. She was found to be obtunded this morning and was difficult to arouse. She is on multiple psychotropic medications including methadone, risperdone, ambien, valproic acid and ativan. Per RN she received ambien last night and this morning was administered methadone and risperdal.  Patient is obtunded as history of obtained from her daughter present at bedside, staff and medical records.                                                                                                                                 Review of Systems:  unobtainable  Past Medical History  Diagnosis Date  . CAD (coronary artery disease)     Catheterization, January, 2007 patent grafts / hospitalization October 2 010 no MI  . Syncope 03/2009    ?orthostasis  . Allergic rhinitis   . History of colon polyps   . Diverticulosis of colon   .  GERD (gastroesophageal reflux disease)   . Hyperlipidemia   . Abdominal pain, other specified site     motility disorder, chorinic functional, severe (Dr Olevia Perches). Possible OCD w/complusive use of enemas and laxatives; psychotic features 2011  . Positive H. pylori test 2911  . Psychosomatic disorder     w/GI fixation  . Anxiety   . Depression   . Stress incontinence, female   . Vitamin D deficiency   . Vitamin B12 deficiency   . Renal insufficiency   . UTI (urinary tract infection)   . Hypokalemia     mild  . Insomnia   . Carotid artery disease     Doppler November, 8416, 6-06% LICA, 30-16% R. ICA.Marland Kitchen increased velocity... possibly from serpentine vessel  . Ejection fraction     50-55%, no wall motion abnormalities, echo, November,  2010  . Edema   . Hemothorax on right   . Breast cancer 2011    Dr Humphrey Rolls- right breast  . Stroke     a. Prior infarct seen on head CT 10/2013.   Past Surgical History  Procedure Laterality Date  . Coronary artery bypass graft    . Myomectomy      Uterine and ovarian  . Breast fibroadenoma surgery    . Hemicolectomy  2005    Left  . Breast lumpectomy  2011  . Right vats (video-assisted thoracoscopic surgery) with     Social History:  reports that she has never smoked. She has never used smokeless tobacco. She reports that she does not drink alcohol or use illicit drugs.  Allergies  Allergen Reactions  . Baclofen     REACTION: confusion  . Belladonna Other (See Comments)    unknown  . Doxycycline Other (See Comments)    unknown  . Iohexol      Code: RASH   . Metoclopramide     Clonic spasms  . Metronidazole     REACTION: rash  . Nitrofurantoin     REACTION: nausea  . Procaine Hcl   . Sucralfate     REACTION: nausea  . Ciprofloxacin Itching and Rash  . Erythromycin Rash and Other (See Comments)    fever  . Gabapentin Rash  . Methenamine Rash  . Nalbuphine Rash  . Penicillins Rash  . Sulfonamide Derivatives Rash   Family History  Problem Relation Age of Onset  . Coronary artery disease      Female 1st degree relative <60  . Colon cancer Mother   . Arthritis Mother   . Heart disease Mother   . Heart disease Father     Prior to Admission medications   Medication Sig Start Date End Date Taking? Authorizing Riddik Senna  acetaminophen (TYLENOL) 325 MG tablet Take 650 mg by mouth every 6 (six) hours as needed for mild pain.   Yes Historical Leo Weyandt, MD  anastrozole (ARIMIDEX) 1 MG tablet Take 1 mg by mouth daily.   Yes Historical Alania Overholt, MD  aspirin EC 81 MG EC tablet Take 1 tablet (81 mg total) by mouth daily. 10/09/13  Yes Janece Canterbury, MD  bisacodyl (DULCOLAX) 10 MG suppository Place 10 mg rectally daily as needed for mild constipation or moderate  constipation.   Yes Historical Rashel Okeefe, MD  conjugated estrogens (PREMARIN) vaginal cream Place 0.5 Applicatorfuls vaginally 2 (two) times a week. 04/10/11  Yes Aleksei Plotnikov V, MD  diphenhydrAMINE (BENADRYL) 25 mg capsule Take 25 mg by mouth every 6 (six) hours as needed for itching.    Yes Historical  Zarian Colpitts, MD  divalproex (DEPAKOTE) 500 MG DR tablet Take 1 tablet (500 mg total) by mouth 3 (three) times daily. 08/14/13  Yes Aleksei Plotnikov V, MD  docusate sodium 100 MG CAPS Take 100 mg by mouth 2 (two) times daily. 10/09/13  Yes Janece Canterbury, MD  FLUoxetine (PROZAC) 10 MG capsule Take 10 mg by mouth daily.   Yes Historical Shannell Mikkelsen, MD  indapamide (LOZOL) 2.5 MG tablet Take 1 tablet (2.5 mg total) by mouth daily. 10/09/13  Yes Janece Canterbury, MD  LORazepam (ATIVAN) 1 MG tablet Take one tablet by mouth every 8 hours as needed for anxiety 10/12/13  Yes Tiffany L Reed, DO  Magnesium Hydroxide (MILK OF MAGNESIA PO) Take 30 mLs by mouth daily as needed (constipation).   Yes Historical Jeny Nield, MD  methadone (DOLOPHINE) 10 MG tablet Take 1 tablet (10 mg total) by mouth 5 (five) times daily as needed for severe pain. Continuation of previous prescription for transfer to SNF only. 10/10/13  Yes Janece Canterbury, MD  metoprolol succinate (TOPROL-XL) 100 MG 24 hr tablet Take 1 tablet (100 mg total) by mouth daily. Take with or immediately following a meal. 10/10/13  Yes Janece Canterbury, MD  nitrofurantoin, macrocrystal-monohydrate, (MACROBID) 100 MG capsule Take 100 mg by mouth 2 (two) times daily. For 7 days   Yes Historical Kerim Statzer, MD  omeprazole (PRILOSEC) 20 MG capsule Take 1 capsule (20 mg total) by mouth 2 (two) times daily. 02/03/13  Yes Aleksei Plotnikov V, MD  promethazine (PHENERGAN) 25 MG tablet Take 1 tablet (25 mg total) by mouth every 8 (eight) hours as needed for nausea. 02/03/13  Yes Aleksei Plotnikov V, MD  risperiDONE (RISPERDAL) 0.5 MG tablet Take 0.5 mg by mouth every morning.   Yes  Historical Prisilla Kocsis, MD  risperiDONE (RISPERDAL) 1 MG tablet Take 1 mg by mouth at bedtime.   Yes Historical Navid Lenzen, MD  Sodium Phosphates (RA SALINE ENEMA RE) Place 1 applicator rectally daily as needed (constipation).   Yes Historical Rowena Moilanen, MD  tamsulosin (FLOMAX) 0.4 MG CAPS Take 1 capsule (0.4 mg total) by mouth daily. 12/02/12  Yes Aleksei Plotnikov V, MD  Vitamin D, Ergocalciferol, (DRISDOL) 50000 UNITS CAPS Take 1 capsule (50,000 Units total) by mouth every 30 (thirty) days. 09/06/11  Yes Aleksei Plotnikov V, MD  zolpidem (AMBIEN) 10 MG tablet Take one tablet by mouth at bedtime as needed for sleep 10/12/13  Yes Gayland Curry, DO   Physical Exam: Blood pressure 96/45, pulse 76, temperature 98.2 F (36.8 C), temperature source Oral, resp. rate 35, height 5\' 2"  (1.575 m), weight 66.4 kg (146 lb 6.2 oz), SpO2 96.00%. Filed Vitals:   10/23/13 1445  BP: 96/45  Pulse: 76  Temp:   Resp: 35     General:  Patient is obtunded, difficult to arouse. Not following commands  Eyes: PERRL, no scleral icterus  Neck: I did not appreciate JVD, neck is supple and symmetrical   Cardiovascular: Regular rate and ryhthm, normal S1S2  Respiratory: Overall clear to auscultation, I did not appreciate crackles or rales  Abdomen: Soft, nontended, nondistended  Skin: no rashes or lesions  Musculoskeletal: No edema to extremities  Psychiatric: Patient obtunded  Neurologic: Can not perform adequate neurologic exam as she is obtunded and can not follow commands  Labs on Admission:  Basic Metabolic Panel:  Recent Labs Lab 10/06/2013 0350 10/10/2013 0352 10/19/2013 0915 10/21/2013 1210 10/23/13 0426  NA  --  134* 136* 137 135*  K  --  2.8* 3.5* 3.6*  3.4*  CL  --  94* 94* 94* 94*  CO2  --   --  31 30 27   GLUCOSE  --  133* 123* 169* 134*  BUN  --  21 21 18 19   CREATININE  --  1.20* 0.99 0.99 1.21*  CALCIUM  --   --  8.8 9.3 9.0  MG 2.2  --   --   --   --    Liver Function Tests: No results  found for this basename: AST, ALT, ALKPHOS, BILITOT, PROT, ALBUMIN,  in the last 168 hours No results found for this basename: LIPASE, AMYLASE,  in the last 168 hours No results found for this basename: AMMONIA,  in the last 168 hours CBC:  Recent Labs Lab 10/08/2013 0345 10/28/2013 0352 10/16/2013 0200 10/16/2013 1210 10/23/13 0426  WBC 12.2*  --  11.5* 12.7* 18.7*  NEUTROABS 9.0*  --   --   --   --   HGB 10.2* 11.6* 9.3* 10.3* 10.0*  HCT 32.0* 34.0* 27.9* 30.7* 30.6*  MCV 94.7  --  93.6 93.0 93.0  PLT 213  --  222 240 279   Cardiac Enzymes:  Recent Labs Lab 11/02/2013 0915  TROPONINI 6.18*   BNP: No components found with this basename: POCBNP,  CBG:  Recent Labs Lab 10/23/13 1421  GLUCAP 179*    Radiological Exams on Admission: No results found.  EKG: Independently reviewed.   Time spent: 55 min  Kelvin Cellar [facility] Pager [phone]  If 7PM-7AM, please contact night-coverage [email or web address] Password [email or web address] 10/23/2013, 3:13 PM

## 2013-10-23 NOTE — Progress Notes (Addendum)
Called to see patient regarding somnolence. Chart reviewed. 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 She had a recent admission for UTI and acute encephalopathy. She was admitted this time with syncope and NSTEMI. CT head nonacute on admission (remote CVA noted). UA was clear on 6/17. She underwent cardiac cath yesterday with BMS to mid SVG-diagonal and PTCA to the SVG-RPDA. She was loaded with 180mg  Brilinta and changed to Plavix. WBC has gone to 18k - she did receive IV solumedrol as premed for cath but WBC was also in 11-12 range prior to this. She was reportedly combative after cath last night and had to have a leg immobilizer. She was lethargic this AM on rounds per Dr. Doug Sou note. BP has been soft in the 80-low 100s range. (Generally was 100-130s before, did fall briefly to 60s-70s last night but nursing reports this was in setting of sheath pull.) CBG 179. K+ 3.4 - do not suspect this is acutely related to presentation but will supplement if she demonstrates she is able to take orals. No new murmurs, lungs clear, cath site nontraumatic. Follow simple commands such as opens eyes and squeezes my hands with husband's translation; frowns to staff/husband's voice. EKG unchanged from prior. Telemetry unremarkable. D/w Dr. Marlou Porch who also saw at bedside. He spoke with the patient's daughter who reported this episode was similar to what she did last admission. Will give gentle IV fluid bolus 250cc for BP support since patient not eating/drinking well today. Will get internal medicine to see regarding further workup of encephalopathy.  Dayna Dunn PA-C

## 2013-10-23 NOTE — Progress Notes (Signed)
Foley huddle x 4 RNs prior to foley insertion. 14 fr foley inserted using sterile technique by two RNS. Urine return was cloudy with sediment.  Dr. Gwenlyn Found notified.

## 2013-10-23 NOTE — Progress Notes (Signed)
10/23/2013 Now Dr. Neita Garnet paged for low BP  Cullom, Carolynn Comment

## 2013-10-23 NOTE — Progress Notes (Signed)
Cardiac Cath Lab Sheath Removal Note  Site Area: Right Femoral Artery Site Prior to Removal: Level 0 Pressure Applied For: ~15 minutes Beginning at 0058 Patient Status during Sheath Pull: Pt's BP dropped into the 37-62 systolic with MAP 83TD and HR in 60-80's. Pt's bed immediately placed in Trendelenburg position, cool cloth applied to forehead, and pt instructed to take slow deep breaths. BP returned to 176'H systolic MAP high 60'V. Pt remained stable for the rest of procedure. Post Sheath Groin Site: Level 0 Post Sheath Instructions Given? Yes Post Pulses Present? Yes Pressure Dressing Applied? Yes   Therisa Doyne RN

## 2013-10-23 NOTE — Progress Notes (Signed)
Follow up note:  I followed up on urine analysis ordered earlier. This showed the presence of Nitrite and Leukocytes along with the presence of many bacteria. Previous urine cultures have grown Klebsiella, organism sensitive to Rocephin. I believe UTI may be contributing to encephalopathy. Will start Rocephin 1 gram IV q 24 hours.   CXR did not reveal infiltrate.

## 2013-10-23 NOTE — Progress Notes (Signed)
TELEMETRY: Reviewed telemetry pt in NSR: Filed Vitals:   10/23/13 0752 10/23/13 0800 10/23/13 0900 10/23/13 1000  BP: 121/58 117/60    Pulse: 90 85 84 83  Temp: 99.6 F (37.6 C)     TempSrc: Oral     Resp: 36 26 36 37  Height:      Weight:      SpO2: 94% 94% 93% 94%    Intake/Output Summary (Last 24 hours) at 10/23/13 1112 Last data filed at 10/23/13 0900  Gross per 24 hour  Intake 1003.09 ml  Output    950 ml  Net  53.09 ml   Filed Weights   10/19/2013 0700 10/26/2013 0332  Weight: 146 lb 9.7 oz (66.5 kg) 146 lb 6.2 oz (66.4 kg)    Subjective Very sedated after methadone and resperidol today. Nurse reports alert this am. No complaints noted.  Marland Kitchen anastrozole  1 mg Oral Daily  . aspirin EC  81 mg Oral Daily  . clopidogrel  75 mg Oral Q breakfast  . conjugated estrogens  1 g Vaginal Once per day on Mon Thu  . divalproex  500 mg Oral TID  . docusate sodium  100 mg Oral BID  . FLUoxetine  10 mg Oral Daily  . heparin  5,000 Units Subcutaneous 3 times per day  . indapamide  2.5 mg Oral Daily  . metoprolol succinate  100 mg Oral Q24H  . pantoprazole  40 mg Oral BID AC  . risperiDONE  0.5 mg Oral Daily  . risperiDONE  1 mg Oral QHS  . sodium chloride  3 mL Intravenous Q12H  . tamsulosin  0.4 mg Oral QPC supper      LABS: Basic Metabolic Panel:  Recent Labs  10/11/2013 0350  10/21/2013 1210 10/23/13 0426  NA  --   < > 137 135*  K  --   < > 3.6* 3.4*  CL  --   < > 94* 94*  CO2  --   < > 30 27  GLUCOSE  --   < > 169* 134*  BUN  --   < > 18 19  CREATININE  --   < > 0.99 1.21*  CALCIUM  --   < > 9.3 9.0  MG 2.2  --   --   --   < > = values in this interval not displayed. Liver Function Tests: No results found for this basename: AST, ALT, ALKPHOS, BILITOT, PROT, ALBUMIN,  in the last 72 hours No results found for this basename: LIPASE, AMYLASE,  in the last 72 hours CBC:  Recent Labs  10/20/2013 0345  10/24/2013 1210 10/23/13 0426  WBC 12.2*  < > 12.7* 18.7*    NEUTROABS 9.0*  --   --   --   HGB 10.2*  < > 10.3* 10.0*  HCT 32.0*  < > 30.7* 30.6*  MCV 94.7  < > 93.0 93.0  PLT 213  < > 240 279  < > = values in this interval not displayed. Cardiac Enzymes:  Recent Labs  10/15/2013 0915  TROPONINI 6.18*   BNP: No results found for this basename: PROBNP,  in the last 72 hours D-Dimer: No results found for this basename: DDIMER,  in the last 72 hours Hemoglobin A1C: No results found for this basename: HGBA1C,  in the last 72 hours Fasting Lipid Panel: No results found for this basename: CHOL, HDL, LDLCALC, TRIG, CHOLHDL, LDLDIRECT,  in the last 72 hours Thyroid Function Tests:  No results found for this basename: TSH, T4TOTAL, FREET3, T3FREE, THYROIDAB,  in the last 72 hours   Radiology/Studies:  No results found.  PHYSICAL EXAM General: Well developed, elderly, in no acute distress. Head: Normocephalic, atraumatic, sclera non-icteric, oropharynx is clear Neck: Negative for carotid bruits. JVD not elevated. No adenopathy Lungs: Clear bilaterally to auscultation without wheezes, rales, or rhonchi. Breathing is unlabored. Heart: RRR S1 S2 without murmurs, rubs, or gallops.  Abdomen: Soft, non-tender, non-distended with normoactive bowel sounds. Extremities: No clubbing, cyanosis or edema.  Distal pedal pulses are 2+ and equal bilaterally. Left radial and right groin cath sites without hematoma.  Neuro: Sedated    ASSESSMENT AND PLAN: 1. NSTEMI/Unstable angina. S/p CABG. S/p BMS of SVG to diagonal and SVG to PDA yesterday. Continue ASA and Plavix. Transfer to floor today and ambulate anticipate DC in am.  2. Urinary retention. Foley in place. Plan on removing when able to get up.  3. Anemia- chronic and stable.   Present on Admission:  . Syncope . NSTEMI (non-ST elevated myocardial infarction)  Signed, [name], [place] 10/23/2013 11:12 AM

## 2013-10-23 NOTE — Progress Notes (Signed)
1140 Cardiac Rehab Came to see pt. She is very sedated. Pt's nurse states that she c/o of abdominal pain and got pain medications. Pt is sleeping and is not appropriate to try to ambulate. We will follow pt later to see if she is more awake. I recommended to RN that pt would probably need Physical Therapy consult for readmission back to SNF. Deon Pilling, RN 10/23/2013 11:49 AM

## 2013-10-24 ENCOUNTER — Inpatient Hospital Stay (HOSPITAL_COMMUNITY): Payer: Medicare Other

## 2013-10-24 DIAGNOSIS — R339 Retention of urine, unspecified: Secondary | ICD-10-CM

## 2013-10-24 DIAGNOSIS — N390 Urinary tract infection, site not specified: Secondary | ICD-10-CM

## 2013-10-24 DIAGNOSIS — D509 Iron deficiency anemia, unspecified: Secondary | ICD-10-CM

## 2013-10-24 LAB — BASIC METABOLIC PANEL
BUN: 35 mg/dL — ABNORMAL HIGH (ref 6–23)
CO2: 26 mEq/L (ref 19–32)
Calcium: 8.6 mg/dL (ref 8.4–10.5)
Chloride: 95 mEq/L — ABNORMAL LOW (ref 96–112)
Creatinine, Ser: 2.09 mg/dL — ABNORMAL HIGH (ref 0.50–1.10)
GFR calc Af Amer: 24 mL/min — ABNORMAL LOW (ref >90)
GFR calc non Af Amer: 21 mL/min — ABNORMAL LOW (ref >90)
GLUCOSE: 154 mg/dL — AB (ref 70–99)
POTASSIUM: 3.7 meq/L (ref 3.7–5.3)
Sodium: 137 mEq/L (ref 137–147)

## 2013-10-24 LAB — CBC
HCT: 29.3 % — ABNORMAL LOW (ref 36.0–46.0)
HEMOGLOBIN: 9.3 g/dL — AB (ref 12.0–15.0)
MCH: 30.2 pg (ref 26.0–34.0)
MCHC: 31.7 g/dL (ref 30.0–36.0)
MCV: 95.1 fL (ref 78.0–100.0)
Platelets: 268 10*3/uL (ref 150–400)
RBC: 3.08 MIL/uL — ABNORMAL LOW (ref 3.87–5.11)
RDW: 13.9 % (ref 11.5–15.5)
WBC: 25.2 10*3/uL — ABNORMAL HIGH (ref 4.0–10.5)

## 2013-10-24 LAB — PRO B NATRIURETIC PEPTIDE: Pro B Natriuretic peptide (BNP): 4016 pg/mL — ABNORMAL HIGH (ref 0–450)

## 2013-10-24 LAB — LACTIC ACID, PLASMA: Lactic Acid, Venous: 2.1 mmol/L (ref 0.5–2.2)

## 2013-10-24 MED ORDER — SODIUM CHLORIDE 0.9 % IV BOLUS (SEPSIS)
500.0000 mL | Freq: Once | INTRAVENOUS | Status: AC
  Administered 2013-10-24: 500 mL via INTRAVENOUS

## 2013-10-24 MED ORDER — METHADONE HCL 10 MG PO TABS
5.0000 mg | ORAL_TABLET | Freq: Two times a day (BID) | ORAL | Status: DC | PRN
  Administered 2013-10-26 – 2013-10-28 (×3): 5 mg via ORAL
  Filled 2013-10-24 (×3): qty 1

## 2013-10-24 MED ORDER — METOPROLOL SUCCINATE ER 50 MG PO TB24
50.0000 mg | ORAL_TABLET | ORAL | Status: DC
  Filled 2013-10-24 (×2): qty 1

## 2013-10-24 MED ORDER — SODIUM CHLORIDE 0.9 % IV SOLN: INTRAVENOUS | Status: DC

## 2013-10-24 MED ORDER — SODIUM CHLORIDE 0.9 % IV BOLUS (SEPSIS)
250.0000 mL | Freq: Once | INTRAVENOUS | Status: AC
  Administered 2013-10-25: 250 mL via INTRAVENOUS

## 2013-10-24 NOTE — Progress Notes (Signed)
CARDIAC REHAB PHASE I   PRE:  Rate/Rhythm: 101/46  BP:  Sitting: 93 SR with PVC     SaO2: 98 2L  Pt asleep upon arrival.  Pt would open eyes and quickly drift back to sleep.  Pt daughter was not present at this time so the telephonic interpreting service was used at this time.  Pt was asked if she could walk with [place] today and she denied stating "not at this time."  [phone]  Lillia Dallas MS, ACSM RCEP 12:01 PM 10/24/2013

## 2013-10-24 NOTE — Progress Notes (Signed)
Patient has Pain in upper abdominal "constantly" (according to daughter ) States is anxious to get OOB. And to return home.Marland Kitchen

## 2013-10-24 NOTE — Progress Notes (Signed)
Md notified about pt's SBP 86. Pt still drowsy.  Will hold bp med at mn. And continue to monitor. Saunders Revel T

## 2013-10-24 NOTE — Progress Notes (Addendum)
Follow up note:  I appears patient becoming somnolent this morning after receiving her methadone done. Will decrease Methadone from 10 to 5 mg PO BID PRN, with holding parameters for oversedation.   Family reporting patient appearing to be tachypneic and expressed concerned with overall worsening condition. I explained that a CXR would be ordered to to further assess since she has been on IV fluids. Will also check a BNP.

## 2013-10-24 NOTE — Progress Notes (Addendum)
[facility] PROGRESS NOTE  [name] PPJ:[id number] DOB: [birth year] DOA: 10/09/2013 PCP: Walker Kehr, MD  Assessment/Plan: 1. Acute encephalopathy -Likely multifactorial with psychotropic medications, underlying infectious process, dehydration, hypotension all likely contributing.  -She was started on gently hydration with NS running at 75 mL/hour overnight -Clinically improving this morning, as she is able to answer questions and follow commands   2. Acute Kidney Injury -Patient's creatinine continues to trend up. AM labs showing creatinine of 2.09 (was 0.99 on admission) -Possibilities include contrast induced nephropathy from cath done yesterday. Could also be due to ATN from hypotension as her SBP's  in the 70's yesterday afternoon (responding to IV fluids). She has had min PO intake as well and appears dehydrated, making prerenal azotemia a possibility.  -Recommend 500 mL bolus of NS this morning, and run maintenance fluids at 75 mL/hour. Will need to keep a close watch on her volume status.   3.  UTI -U/A done yesterday consistent with UTI. Patient undergoing foley cath placement earlier in this hospitalization.  -White count elevated.  -Previous urine culture positive for Klebsiella, with sensitivity to Rocephin -Continue Rocephin, follow up on cultures.   Code Status: Full Family Communication: Spoke with her husband at bedside   Antibiotics:  Rocephin  HPI/Subjective: On my evaluation this morning she appears to be improving as she is more awake and alert, speaking to her husband and following commands, however, labs work showing upward trend in creatinine and WBC's. Labs yesterday showing presence of UTI.   Objective: Filed Vitals:   10/24/13 0809  BP: 134/72  Pulse: 74  Temp: 98.1 F (36.7 C)  Resp: 22    Intake/Output Summary (Last 24 hours) at 10/24/13 0817 Last data filed at 10/24/13 0600  Gross per 24 hour  Intake 1563.75 ml  Output     651 ml  Net 912.75 ml   Filed Weights   10/24/2013 0700 10/30/2013 0332  Weight: 66.5 kg (146 lb 9.7 oz) 66.4 kg (146 lb 6.2 oz)    Exam:   General:  She is more awake and alert, is following commands, improvement since yesterday  Cardiovascular: Regular rate and rhythm, normal S1S2  Respiratory: Clear to auscultation bilaterally, normal inspiratory effort  Abdomen: Soft nontender nondistended  Musculoskeletal: No edema  Data Reviewed: Basic Metabolic Panel:  Recent Labs Lab 10/16/2013 0350  10/26/2013 0915 10/21/2013 1210 10/23/13 0426 10/23/13 1600 10/24/13 0315  NA  --   < > 136* 137 135* 134* 137  K  --   < > 3.5* 3.6* 3.4* 5.8* 3.7  CL  --   < > 94* 94* 94* 97 95*  CO2  --   --  31 30 27  11* 26  GLUCOSE  --   < > 123* 169* 134* 255* 154*  BUN  --   < > 21 18 19 19  35*  CREATININE  --   < > 0.99 0.99 1.21* 1.80* 2.09*  CALCIUM  --   --  8.8 9.3 9.0 8.2* 8.6  MG 2.2  --   --   --   --   --   --   < > = values in this interval not displayed. Liver Function Tests: No results found for this basename: AST, ALT, ALKPHOS, BILITOT, PROT, ALBUMIN,  in the last 168 hours No results found for this basename: LIPASE, AMYLASE,  in the last 168 hours No results found for this basename: AMMONIA,  in the last 168 hours CBC:  Recent  Labs Lab 10/05/2013 0345 10/20/2013 0352 10/21/2013 0200 10/17/2013 1210 10/23/13 0426 10/24/13 0315  WBC 12.2*  --  11.5* 12.7* 18.7* 25.2*  NEUTROABS 9.0*  --   --   --   --   --   HGB 10.2* 11.6* 9.3* 10.3* 10.0* 9.3*  HCT 32.0* 34.0* 27.9* 30.7* 30.6* 29.3*  MCV 94.7  --  93.6 93.0 93.0 95.1  PLT 213  --  222 240 279 268   Cardiac Enzymes:  Recent Labs Lab 10/07/2013 0915  TROPONINI 6.18*   BNP (last 3 results) No results found for this basename: PROBNP,  in the last 8760 hours CBG:  Recent Labs Lab 10/23/13 1421  GLUCAP 179*    Recent Results (from the past 240 hour(s))  MRSA PCR SCREENING     Status: None   Collection Time     10/11/2013  7:25 AM      Result Value Ref Range Status   MRSA by PCR NEGATIVE  NEGATIVE Final   Comment:            The GeneXpert MRSA Assay (FDA     approved for NASAL specimens     only), is one component of a     comprehensive MRSA colonization     surveillance program. It is not     intended to diagnose MRSA     infection nor to guide or     monitor treatment for     MRSA infections.     Studies: Dg Chest Port 1 View  10/23/2013   CLINICAL DATA:  Hypoxia, fever.  EXAM: PORTABLE CHEST - 1 VIEW  COMPARISON:  October 21, 2013.  FINDINGS: Stable cardiomegaly. Status post coronary artery bypass graft. No pneumothorax is noted. Minimal right pleural effusion is noted. Severe narrowing of right subacromial space is noted concerning for rotator cuff injury. Old right rib fractures are noted.  IMPRESSION: Minimal right pleural effusion. Stable chronic findings as described above.   Electronically Signed   By: Sabino Dick M.D.   On: 10/23/2013 15:19    Scheduled Meds: . anastrozole  1 mg Oral Daily  . aspirin EC  81 mg Oral Daily  . cefTRIAXone (ROCEPHIN)  IV  1 g Intravenous Q24H  . clopidogrel  75 mg Oral Q breakfast  . conjugated estrogens  1 g Vaginal Once per day on Mon Thu  . divalproex  500 mg Oral TID  . docusate sodium  100 mg Oral BID  . FLUoxetine  10 mg Oral Daily  . heparin  5,000 Units Subcutaneous 3 times per day  . indapamide  2.5 mg Oral Daily  . metoprolol succinate  100 mg Oral Q24H  . pantoprazole  40 mg Oral BID AC  . sodium chloride  500 mL Intravenous Once  . sodium chloride  3 mL Intravenous Q12H  . tamsulosin  0.4 mg Oral QPC supper   Continuous Infusions: . sodium chloride      Principal Problem:   NSTEMI (non-ST elevated myocardial infarction) Active Problems:   Syncope   Hx of CABG    Time spent: 25 min    Kelvin Cellar  [facility] Pager [phone]. If 7PM-7AM, please contact night-coverage at [email or web address], password [facility] 10/24/2013,  8:17 AM  LOS: 3 days

## 2013-10-24 NOTE — Progress Notes (Addendum)
TELEMETRY: Reviewed telemetry pt in NSR: Filed Vitals:   10/24/13 0500 10/24/13 0600 10/24/13 0609 10/24/13 0700  BP: 118/38 94/34 112/44 116/32  Pulse: 83 72 76 74  Temp:      TempSrc:      Resp: 30 27 23 28   Height:      Weight:      SpO2: 96% 93% 100% 99%    Intake/Output Summary (Last 24 hours) at 10/24/13 0805 Last data filed at 10/24/13 0600  Gross per 24 hour  Intake 1563.75 ml  Output    651 ml  Net 912.75 ml   Filed Weights   10/19/2013 0700 10/14/2013 0332  Weight: 146 lb 9.7 oz (66.5 kg) 146 lb 6.2 oz (66.4 kg)    Subjective More alert today. Husband at bedside. Denies chest pain or SOB.   Marland Kitchen anastrozole  1 mg Oral Daily  . aspirin EC  81 mg Oral Daily  . cefTRIAXone (ROCEPHIN)  IV  1 g Intravenous Q24H  . clopidogrel  75 mg Oral Q breakfast  . conjugated estrogens  1 g Vaginal Once per day on Mon Thu  . divalproex  500 mg Oral TID  . docusate sodium  100 mg Oral BID  . FLUoxetine  10 mg Oral Daily  . heparin  5,000 Units Subcutaneous 3 times per day  . indapamide  2.5 mg Oral Daily  . metoprolol succinate  100 mg Oral Q24H  . pantoprazole  40 mg Oral BID AC  . sodium chloride  500 mL Intravenous Once  . sodium chloride  3 mL Intravenous Q12H  . tamsulosin  0.4 mg Oral QPC supper   . sodium chloride 75 mL/hr at 10/23/13 1517    LABS: Basic Metabolic Panel:  Recent Labs  10/23/13 1600 10/24/13 0315  NA 134* 137  K 5.8* 3.7  CL 97 95*  CO2 11* 26  GLUCOSE 255* 154*  BUN 19 35*  CREATININE 1.80* 2.09*  CALCIUM 8.2* 8.6   Liver Function Tests: No results found for this basename: AST, ALT, ALKPHOS, BILITOT, PROT, ALBUMIN,  in the last 72 hours No results found for this basename: LIPASE, AMYLASE,  in the last 72 hours CBC:  Recent Labs  10/23/13 0426 10/24/13 0315  WBC 18.7* 25.2*  HGB 10.0* 9.3*  HCT 30.6* 29.3*  MCV 93.0 95.1  PLT 279 268   Cardiac Enzymes:  Recent Labs  10/06/2013 0915  TROPONINI 6.18*   BNP: No results found  for this basename: PROBNP,  in the last 72 hours D-Dimer: No results found for this basename: DDIMER,  in the last 72 hours Hemoglobin A1C: No results found for this basename: HGBA1C,  in the last 72 hours Fasting Lipid Panel: No results found for this basename: CHOL, HDL, LDLCALC, TRIG, CHOLHDL, LDLDIRECT,  in the last 72 hours Thyroid Function Tests: No results found for this basename: TSH, T4TOTAL, FREET3, T3FREE, THYROIDAB,  in the last 72 hours  Urinalysis    Component Value Date/Time   COLORURINE AMBER* 10/23/2013 1500   APPEARANCEUR CLOUDY* 10/23/2013 1500   LABSPEC 1.015 10/23/2013 1500   PHURINE 5.0 10/23/2013 1500   GLUCOSEU NEGATIVE 10/23/2013 1500   GLUCOSEU NEGATIVE 10/26/2009 1148   HGBUR SMALL* 10/23/2013 1500   BILIRUBINUR SMALL* 10/23/2013 1500   KETONESUR 15* 10/23/2013 1500   PROTEINUR 30* 10/23/2013 1500   UROBILINOGEN 0.2 10/23/2013 1500   NITRITE POSITIVE* 10/23/2013 1500   LEUKOCYTESUR MODERATE* 10/23/2013 1500       Radiology/Studies:  Dg Chest Port 1 View  10/23/2013   CLINICAL DATA:  Hypoxia, fever.  EXAM: PORTABLE CHEST - 1 VIEW  COMPARISON:  October 21, 2013.  FINDINGS: Stable cardiomegaly. Status post coronary artery bypass graft. No pneumothorax is noted. Minimal right pleural effusion is noted. Severe narrowing of right subacromial space is noted concerning for rotator cuff injury. Old right rib fractures are noted.  IMPRESSION: Minimal right pleural effusion. Stable chronic findings as described above.   Electronically Signed   By: Sabino Dick M.D.   On: 10/23/2013 15:19   Ecg: NSR with LVH. Old inferior MI.  PHYSICAL EXAM General: Well developed, elderly, in no acute distress. Alert and conversing in [place] with spouse Head: Normocephalic, atraumatic, sclera non-icteric, oropharynx is clear Neck: Negative for carotid bruits. JVD not elevated. No adenopathy Lungs: Clear bilaterally to auscultation without wheezes, rales, or rhonchi. Breathing is  unlabored. Heart: RRR S1 S2 without murmurs, rubs, or gallops.  Abdomen: Soft, non-tender, non-distended with normoactive bowel sounds. Extremities: No clubbing, cyanosis or edema.  Distal pedal pulses are 2+ and equal bilaterally. Left radial and right groin cath sites without hematoma.  Neuro: alert and oriented today. Moves all extremities.   ASSESSMENT AND PLAN: 1. NSTEMI/Unstable angina. S/p CABG. S/p BMS of SVG to diagonal and SVG to PDA 11/02/2013. Continue ASA and Plavix. No recurrent angina. Ecg is stable.   2. Urinary retention. Foley in place. ? Exacerbated by UTI  3. Anemia- chronic and stable.  4. UTI. Elevated WBC. Started antibiotics.  5. ARF. Creatinine increased from 0.99>1.8>2.09. Probably related to contrast induced nephropathy. Received 250 cc of contrast with PCI. Also component of ATN with hypotension. Other contributing factors include UTI and urinary retention. Will continue IVF at current rate but give bolus now. Avoid hypotension. I would not push IVF too much since she has diastolic dysfunction. EDP 21 mmHg at cath.  Follow BMET daily. Will reduce metoprolol to 50 mg daily. Discontinue Lozol.  6. Acute encephalopathy. I think this was mostly oversedation with meds. All sedatives held except methadone. Exacerbated by UTI.  Appreciate Hospitalist input.    Present on Admission:  . Syncope . NSTEMI (non-ST elevated myocardial infarction)  Signed, Peter Martin[place], [place] 10/24/2013 8:05 AM

## 2013-10-25 ENCOUNTER — Encounter: Payer: Self-pay | Admitting: Internal Medicine

## 2013-10-25 DIAGNOSIS — A419 Sepsis, unspecified organism: Secondary | ICD-10-CM

## 2013-10-25 DIAGNOSIS — G894 Chronic pain syndrome: Secondary | ICD-10-CM

## 2013-10-25 DIAGNOSIS — G9340 Encephalopathy, unspecified: Secondary | ICD-10-CM

## 2013-10-25 DIAGNOSIS — N179 Acute kidney failure, unspecified: Secondary | ICD-10-CM

## 2013-10-25 DIAGNOSIS — R6521 Severe sepsis with septic shock: Secondary | ICD-10-CM

## 2013-10-25 DIAGNOSIS — R6520 Severe sepsis without septic shock: Secondary | ICD-10-CM

## 2013-10-25 DIAGNOSIS — I4891 Unspecified atrial fibrillation: Secondary | ICD-10-CM

## 2013-10-25 LAB — PROCALCITONIN: Procalcitonin: 1.68 ng/mL

## 2013-10-25 LAB — LACTIC ACID, PLASMA: Lactic Acid, Venous: 1.5 mmol/L (ref 0.5–2.2)

## 2013-10-25 LAB — URINALYSIS, ROUTINE W REFLEX MICROSCOPIC
GLUCOSE, UA: NEGATIVE mg/dL
HGB URINE DIPSTICK: NEGATIVE
Ketones, ur: NEGATIVE mg/dL
LEUKOCYTES UA: NEGATIVE
Nitrite: NEGATIVE
PROTEIN: 30 mg/dL — AB
Specific Gravity, Urine: 1.028 (ref 1.005–1.030)
UROBILINOGEN UA: 0.2 mg/dL (ref 0.0–1.0)
pH: 5 (ref 5.0–8.0)

## 2013-10-25 LAB — CBC
HCT: 29.7 % — ABNORMAL LOW (ref 36.0–46.0)
Hemoglobin: 9.4 g/dL — ABNORMAL LOW (ref 12.0–15.0)
MCH: 30.4 pg (ref 26.0–34.0)
MCHC: 31.6 g/dL (ref 30.0–36.0)
MCV: 96.1 fL (ref 78.0–100.0)
Platelets: 267 10*3/uL (ref 150–400)
RBC: 3.09 MIL/uL — ABNORMAL LOW (ref 3.87–5.11)
RDW: 14.1 % (ref 11.5–15.5)
WBC: 33.3 10*3/uL — AB (ref 4.0–10.5)

## 2013-10-25 LAB — URINE MICROSCOPIC-ADD ON

## 2013-10-25 LAB — BASIC METABOLIC PANEL
BUN: 41 mg/dL — AB (ref 6–23)
CO2: 25 meq/L (ref 19–32)
CREATININE: 1.6 mg/dL — AB (ref 0.50–1.10)
Calcium: 8.7 mg/dL (ref 8.4–10.5)
Chloride: 100 mEq/L (ref 96–112)
GFR calc Af Amer: 33 mL/min — ABNORMAL LOW (ref >90)
GFR calc non Af Amer: 29 mL/min — ABNORMAL LOW (ref >90)
Glucose, Bld: 184 mg/dL — ABNORMAL HIGH (ref 70–99)
Potassium: 3.5 mEq/L — ABNORMAL LOW (ref 3.7–5.3)
Sodium: 140 mEq/L (ref 137–147)

## 2013-10-25 LAB — BLOOD GAS, ARTERIAL
Acid-Base Excess: 1.6 mmol/L (ref 0.0–2.0)
Bicarbonate: 25.2 mEq/L — ABNORMAL HIGH (ref 20.0–24.0)
Drawn by: 249101
O2 Content: 2 L/min
O2 Saturation: 94.8 %
PO2 ART: 84.6 mmHg (ref 80.0–100.0)
Patient temperature: 98.6
TCO2: 26.3 mmol/L (ref 0–100)
pCO2 arterial: 36.7 mmHg (ref 35.0–45.0)
pH, Arterial: 7.451 — ABNORMAL HIGH (ref 7.350–7.450)

## 2013-10-25 LAB — MAGNESIUM: MAGNESIUM: 2.3 mg/dL (ref 1.5–2.5)

## 2013-10-25 LAB — HEPARIN LEVEL (UNFRACTIONATED): HEPARIN UNFRACTIONATED: 0.25 [IU]/mL — AB (ref 0.30–0.70)

## 2013-10-25 MED ORDER — SODIUM CHLORIDE 0.9 % IV BOLUS (SEPSIS)
500.0000 mL | Freq: Once | INTRAVENOUS | Status: AC
  Administered 2013-10-25: 500 mL via INTRAVENOUS

## 2013-10-25 MED ORDER — HEPARIN BOLUS VIA INFUSION
3000.0000 [IU] | Freq: Once | INTRAVENOUS | Status: AC
  Administered 2013-10-25: 3000 [IU] via INTRAVENOUS
  Filled 2013-10-25: qty 3000

## 2013-10-25 MED ORDER — VANCOMYCIN HCL IN DEXTROSE 1-5 GM/200ML-% IV SOLN
1000.0000 mg | INTRAVENOUS | Status: DC
  Administered 2013-10-25: 1000 mg via INTRAVENOUS
  Filled 2013-10-25: qty 200

## 2013-10-25 MED ORDER — AMIODARONE LOAD VIA INFUSION
150.0000 mg | Freq: Once | INTRAVENOUS | Status: AC
  Administered 2013-10-25: 150 mg via INTRAVENOUS
  Filled 2013-10-25: qty 83.34

## 2013-10-25 MED ORDER — HEPARIN (PORCINE) IN NACL 100-0.45 UNIT/ML-% IJ SOLN
1200.0000 [IU]/h | INTRAMUSCULAR | Status: DC
  Administered 2013-10-25: 1150 [IU]/h via INTRAVENOUS
  Administered 2013-10-26: 1200 [IU]/h via INTRAVENOUS
  Filled 2013-10-25 (×3): qty 250

## 2013-10-25 MED ORDER — AMIODARONE HCL IN DEXTROSE 360-4.14 MG/200ML-% IV SOLN
60.0000 mg/h | INTRAVENOUS | Status: AC
  Administered 2013-10-25: 60 mg/h via INTRAVENOUS
  Filled 2013-10-25: qty 200

## 2013-10-25 MED ORDER — DEXTROSE 5 % IV SOLN
500.0000 mg | INTRAVENOUS | Status: DC
  Administered 2013-10-25 – 2013-10-26 (×2): 500 mg via INTRAVENOUS
  Filled 2013-10-25 (×2): qty 0.5

## 2013-10-25 MED ORDER — AMIODARONE HCL IN DEXTROSE 360-4.14 MG/200ML-% IV SOLN
30.0000 mg/h | INTRAVENOUS | Status: DC
  Administered 2013-10-25 – 2013-10-26 (×2): 30 mg/h via INTRAVENOUS
  Filled 2013-10-25 (×5): qty 200

## 2013-10-25 MED ORDER — AMIODARONE HCL IN DEXTROSE 360-4.14 MG/200ML-% IV SOLN
INTRAVENOUS | Status: AC
Start: 2013-10-25 — End: 2013-10-25
  Filled 2013-10-25: qty 200

## 2013-10-25 NOTE — Progress Notes (Signed)
Pt has noted voided or incontinent since foley removed, has had 3 i/o caths with uop.  Foley placed per MD order.

## 2013-10-25 NOTE — Consult Note (Signed)
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  Returned 6/17 with syncope and chest pain. Admitted by cardiology with NSTEMI with [facility] consult.  Underwent cardiac cath and PCI with BMS 6/18. PCCM consulted 6/21 for sepsis.   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 Initial troponin 6.7. Admitted by cardiology with NTEMI and syncope.  Underwent cardiac cath and PCI with BMS 6/18. Developed Afib RVR 6/21.  Continued issues with UTI/SIRS and PCCM consulted 6/21 for assist.     PAST MEDICAL HISTORY :  Past Medical History  Diagnosis Date  . CAD (coronary artery disease)     Catheterization, January, 2007 patent grafts / hospitalization October 2 010 no MI  . Syncope 03/2009    ?orthostasis  . Allergic rhinitis   . History of colon polyps   . Diverticulosis of colon   . GERD (gastroesophageal reflux disease)   . Hyperlipidemia   . Abdominal pain, other specified site     motility disorder, chorinic functional, severe (Dr Olevia Perches). Possible OCD w/complusive use of enemas and laxatives;  psychotic features 2011  . Positive H. pylori test 2911  . Psychosomatic disorder     w/GI fixation  . Anxiety   . Depression   . Stress incontinence, female   . Vitamin D deficiency   . Vitamin B12 deficiency   . Renal insufficiency   . UTI (urinary tract infection)   . Hypokalemia     mild  . Insomnia   . Carotid artery disease     Doppler November, 4332, 9-51% LICA, 88-41% R. ICA.Marland Kitchen increased velocity... possibly from serpentine vessel  . Ejection fraction     50-55%, no wall motion abnormalities, echo, November, 2010  . Edema   . Hemothorax on right   . Breast cancer 2011    Dr Humphrey Rolls- right breast  . Stroke     a. Prior infarct seen on head CT 10/2013.   Past Surgical History  Procedure Laterality Date  . Coronary artery bypass graft    . Myomectomy      Uterine and ovarian  . Breast fibroadenoma surgery    . Hemicolectomy  2005    Left  . Breast lumpectomy  2011  . Right vats (video-assisted thoracoscopic surgery) with     Prior to Admission medications   Medication Sig Start Date End Date Taking? Authorizing Provider  acetaminophen (TYLENOL) 325 MG tablet Take 650 mg by mouth every 6 (six) hours as needed for mild pain.   Yes Historical Provider, MD  anastrozole (ARIMIDEX) 1 MG tablet Take 1 mg by mouth daily.   Yes Historical Provider,  MD  aspirin EC 81 MG EC tablet Take 1 tablet (81 mg total) by mouth daily. 10/09/13  Yes Janece Canterbury, MD  bisacodyl (DULCOLAX) 10 MG suppository Place 10 mg rectally daily as needed for mild constipation or moderate constipation.   Yes Historical Provider, MD  conjugated estrogens (PREMARIN) vaginal cream Place 0.5 Applicatorfuls vaginally 2 (two) times a week. 04/10/11  Yes Aleksei Plotnikov V, MD  diphenhydrAMINE (BENADRYL) 25 mg capsule Take 25 mg by mouth every 6 (six) hours as needed for itching.    Yes Historical Provider, MD  divalproex (DEPAKOTE) 500 MG DR tablet Take 1 tablet (500 mg total) by mouth 3 (three) times daily.  08/14/13  Yes Aleksei Plotnikov V, MD  docusate sodium 100 MG CAPS Take 100 mg by mouth 2 (two) times daily. 10/09/13  Yes Janece Canterbury, MD  FLUoxetine (PROZAC) 10 MG capsule Take 10 mg by mouth daily.   Yes Historical Provider, MD  indapamide (LOZOL) 2.5 MG tablet Take 1 tablet (2.5 mg total) by mouth daily. 10/09/13  Yes Janece Canterbury, MD  LORazepam (ATIVAN) 1 MG tablet Take one tablet by mouth every 8 hours as needed for anxiety 10/12/13  Yes Tiffany L Reed, DO  Magnesium Hydroxide (MILK OF MAGNESIA PO) Take 30 mLs by mouth daily as needed (constipation).   Yes Historical Provider, MD  methadone (DOLOPHINE) 10 MG tablet Take 1 tablet (10 mg total) by mouth 5 (five) times daily as needed for severe pain. Continuation of previous prescription for transfer to SNF only. 10/10/13  Yes Janece Canterbury, MD  metoprolol succinate (TOPROL-XL) 100 MG 24 hr tablet Take 1 tablet (100 mg total) by mouth daily. Take with or immediately following a meal. 10/10/13  Yes Janece Canterbury, MD  nitrofurantoin, macrocrystal-monohydrate, (MACROBID) 100 MG capsule Take 100 mg by mouth 2 (two) times daily. For 7 days   Yes Historical Provider, MD  omeprazole (PRILOSEC) 20 MG capsule Take 1 capsule (20 mg total) by mouth 2 (two) times daily. 02/03/13  Yes Aleksei Plotnikov V, MD  promethazine (PHENERGAN) 25 MG tablet Take 1 tablet (25 mg total) by mouth every 8 (eight) hours as needed for nausea. 02/03/13  Yes Aleksei Plotnikov V, MD  risperiDONE (RISPERDAL) 0.5 MG tablet Take 0.5 mg by mouth every morning.   Yes Historical Provider, MD  risperiDONE (RISPERDAL) 1 MG tablet Take 1 mg by mouth at bedtime.   Yes Historical Provider, MD  Sodium Phosphates (RA SALINE ENEMA RE) Place 1 applicator rectally daily as needed (constipation).   Yes Historical Provider, MD  tamsulosin (FLOMAX) 0.4 MG CAPS Take 1 capsule (0.4 mg total) by mouth daily. 12/02/12  Yes Aleksei Plotnikov V, MD  Vitamin D, Ergocalciferol, (DRISDOL) 50000 UNITS CAPS Take  1 capsule (50,000 Units total) by mouth every 30 (thirty) days. 09/06/11  Yes Aleksei Plotnikov V, MD  zolpidem (AMBIEN) 10 MG tablet Take one tablet by mouth at bedtime as needed for sleep 10/12/13  Yes Tiffany L Reed, DO   Allergies  Allergen Reactions  . Baclofen     REACTION: confusion  . Belladonna Other (See Comments)    unknown  . Doxycycline Other (See Comments)    unknown  . Iohexol      Code: RASH   . Metoclopramide     Clonic spasms  . Metronidazole     REACTION: rash  . Nitrofurantoin     REACTION: nausea  . Procaine Hcl   . Sucralfate     REACTION: nausea  .  Ciprofloxacin Itching and Rash  . Erythromycin Rash and Other (See Comments)    fever  . Gabapentin Rash  . Methenamine Rash  . Nalbuphine Rash  . Penicillins Rash  . Sulfonamide Derivatives Rash    FAMILY HISTORY:  Family History  Problem Relation Age of Onset  . Coronary artery disease      Female 1st degree relative <60  . Colon cancer Mother   . Arthritis Mother   . Heart disease Mother   . Heart disease Father    SOCIAL HISTORY:  reports that she has never smoked. She has never used smokeless tobacco. She reports that she does not drink alcohol or use illicit drugs.  REVIEW OF SYSTEMS:  As per HPI - All other systems reviewed and were neg.    SUBJECTIVE:   VITAL SIGNS: Temp:  [98.2 F (36.8 C)-99.4 F (37.4 C)] 98.5 F (36.9 C) (06/21 1100) Pulse Rate:  [62-107] 95 (06/21 0900) Resp:  [22-36] 35 (06/21 1100) BP: (74-117)/(18-95) 84/58 mmHg (06/21 1100) SpO2:  [92 %-100 %] 97 % (06/21 1100) Weight:  [153 lb (69.4 kg)] 153 lb (69.4 kg) (06/21 0321) HEMODYNAMICS:   VENTILATOR SETTINGS:   INTAKE / OUTPUT: Intake/Output     06/20 0701 - 06/21 0700 06/21 0701 - 06/22 0700   P.O. 440    I.V. (mL/kg)     IV Piggyback 250 750   Total Intake(mL/kg) 690 (9.9) 750 (10.8)   Urine (mL/kg/hr) 675 (0.4)    Stool     Total Output 675     Net +15 +750        Stool Occurrence 3 x       PHYSICAL EXAMINATION: General:  Chronically ill appearing elderly female, NAD  Neuro:  Awake, alert, russian speaking, MAE, does follow some commands HEENT:  Mm moist, no JVD Cardiovascular:  s1s2 rrr, now NSR Lungs:  resps even non labored on RA, mild tachypnea, diminished bases, no audible wheeze or crackle  Abdomen:  Soft, +bs Musculoskeletal:  Warm and dry, no edema   LABS:  CBC  Recent Labs Lab 10/23/13 0426 10/24/13 0315 10/25/13 0230  WBC 18.7* 25.2* 33.3*  HGB 10.0* 9.3* 9.4*  HCT 30.6* 29.3* 29.7*  PLT 279 268 267   Coag's  Recent Labs Lab 10/27/2013 0200  INR 1.10   BMET  Recent Labs Lab 10/23/13 1600 10/24/13 0315 10/25/13 0805  NA 134* 137 140  K 5.8* 3.7 3.5*  CL 97 95* 100  CO2 11* 26 25  BUN 19 35* 41*  CREATININE 1.80* 2.09* 1.60*  GLUCOSE 255* 154* 184*   Electrolytes  Recent Labs Lab 10/22/2013 0350  10/23/13 1600 10/24/13 0315 10/25/13 0805  CALCIUM  --   < > 8.2* 8.6 8.7  MG 2.2  --   --   --  2.3  < > = values in this interval not displayed. Sepsis Markers  Recent Labs Lab 10/13/2013 0438 10/24/13 1005 10/25/13 0805  LATICACIDVEN 1.32 2.1 1.5   ABG  Recent Labs Lab 10/25/13 0810  PHART 7.451*  PCO2ART 36.7  PO2ART 84.6   Liver Enzymes No results found for this basename: AST, ALT, ALKPHOS, BILITOT, ALBUMIN,  in the last 168 hours Cardiac Enzymes  Recent Labs Lab 10/18/2013 0915 10/24/13 0315  TROPONINI 6.18*  --   PROBNP  --  4016.0*   Glucose  Recent Labs Lab 10/23/13 1421  GLUCAP 179*    Imaging Dg Chest Port 1 View  10/24/2013   CLINICAL  DATA:  Tachypnea, ?Fluid overload  EXAM: PORTABLE CHEST - 1 VIEW  COMPARISON:  Portable chest radiograph 10/23/2013  FINDINGS: Low lung volumes. Cardiac silhouette is enlarged. Stable median sternotomy and coronary artery bypass graft changes. Aorta is tortuous and ectatic. There is minimal blunting of the right costophrenic angle. The lungs otherwise clear. Stable  calcified granuloma left mid lung. Degenerative changes within the shoulders.  IMPRESSION: Stable minimal right pleural effusion.  No acute cardiopulmonary disease.  Stable chronic changes.   Electronically Signed   By: Margaree Mackintosh M.D.   On: 10/24/2013 17:32   Dg Chest Port 1 View  10/23/2013   CLINICAL DATA:  Hypoxia, fever.  EXAM: PORTABLE CHEST - 1 VIEW  COMPARISON:  October 21, 2013.  FINDINGS: Stable cardiomegaly. Status post coronary artery bypass graft. No pneumothorax is noted. Minimal right pleural effusion is noted. Severe narrowing of right subacromial space is noted concerning for rotator cuff injury. Old right rib fractures are noted.  IMPRESSION: Minimal right pleural effusion. Stable chronic findings as described above.   Electronically Signed   By: Sabino Dick M.D.   On: 10/23/2013 15:19      ASSESSMENT / PLAN:  PULMONARY Tachypnea  P:   pulm hygiene  F/u CXR  Supplemental O2 as needed    CARDIOVASCULAR Afib RVR  CAD  NSTEMI - s/p PCI 6/18 Hypotension - seems r/t RVR - much improved with rate control  Chronic dCHF SIRS/sepsis - lactate reassuring  P:  Amiodarone gtt per cards  Diona Fanti, plavix  Trend lactate  Heparin gtt per cards  Consider CVL for CVP - hold for now with improved BP Gentle volume  F/u bnp   RENAL Hypokalemia  Acute on chronic renal insufficiency - baseline Scr ~0.9-1.0 P:   Gentle volume  F/u cbc  Replete K prn   GASTROINTESTINAL No active issue  P:   Po diet as tol   HEMATOLOGIC Leukocytosis  Anemia - mild  P:  F/u cbc  Heparin gtt per cards   INFECTIOUS Presumed UTI (previous klebsiella UTI)  Sepsis  P:   Check pct  Broaden abx as above  Pan culture  F/u lactate   ENDOCRINE No active issue   P:   Monitor glucose on chem   NEUROLOGIC Chronic pain  Psychosomatic do  Hx psychosis  P:   Cont methadone per [facility]  Avoid oversedation   Nickolas Madrid, NP 10/25/2013  12:14 PM Pager: [phone] or [phone]  [phone]  BP seems to be improved with the patient out of a-fib and mentating well, seems mildly under-resuscitated, will give gentle volume and agree with abx change.  Will reassess need for TLC after volume.  I have personally obtained a history, examined the patient, evaluated laboratory and imaging results, formulated the assessment and plan and placed orders.  CRITICAL CARE: The patient is critically ill with multiple organ systems failure and requires high complexity decision making for assessment and support, frequent evaluation and titration of therapies, application of advanced monitoring technologies and extensive interpretation of multiple databases. Critical Care Time devoted to patient care services described in this note is 40 minutes.   Rush Farmer, M.D. [facility] Pulmonary/Critical Care Medicine. Pager: [phone]. After hours pager: [phone].

## 2013-10-25 NOTE — Assessment & Plan Note (Signed)
She developed a transient low grade fever to 100.38F on 6/5 overnight. Since she was clincally improving with ceftriaxone for her UTI, it was felt that resistent UTI was not the cause. Since she was obtunded the day prior, there was suspicion for aspiration, which was confirmed by CXR which demonstrated a RUL opacity. Due to multiple medication allergies, she was unable to use combination keflex/flagyl, so she was started on clindamycin and florastor in addition to her keflex. Oxygen saturation stable on room air.

## 2013-10-25 NOTE — Assessment & Plan Note (Signed)
Patient remained asymptomatic. EKG does not show any clear-cut ischemic changes.  - troponings neg  - Potassium and magnesium wnl  - ECHO demonstrated ejection fraction of 55-60% with inferolateral hypokinesis, grade 1 diastolic dysfunction.  - BB increased by cardiology  - Telemetry: PVC, NSR  - Recommend followup with cardiology for further evaluation of possible CAD if indicated

## 2013-10-25 NOTE — Progress Notes (Signed)
Pharmacy: Re-Heparin  Patient is an 80 y.o F on heparin for Afib.  Heparin level sub-therapeutic at 0.25.  RN stated that patient's IV line was changed this evening and some bleeding is noted at the old IV site.  Plan: 1) Will increase rate slightly to 1200 units/hr and check another level in 8 hours. 2) Monitor IV site for bleeding  Dia Sitter, PharmD, BCPS

## 2013-10-25 NOTE — Assessment & Plan Note (Signed)
Continue arimidex

## 2013-10-25 NOTE — Assessment & Plan Note (Signed)
And HTN ;she frequently has transient behavioral disturbance when she has UTIs. She has been compliant with her medications. Continued her home medications including Depakote, Risperdal, fluoxetine. Haldol may have contributed to extrapyramidal symptoms with tongue thrusting so this prn medication was discontinued

## 2013-10-25 NOTE — Progress Notes (Signed)
Pt old iv site oozing right FA.  Held pressure at site for 15 min.   Applied new dresssing and gauze.  Will continue to monitor. Saunders Revel T

## 2013-10-25 NOTE — Progress Notes (Signed)
[facility] PROGRESS NOTE  [name] BOF:[id number] DOB: June 25, 1927 DOA: 10/16/2013 PCP: Walker Kehr, MD  Assessment/Plan: 1.  Severe Sepsis -Evidenced by respiratory rate in the 30's, rising white count now at 33.3, Hypotension (SBP's in the 80's) acute kidney injury, encephalopathy, developing during this hospitalization, with infectious source likely urinary tract. Foley cath placed earlier in this hospitalization for urinary retention.  -PCCM consulted for consideration of central line placement for obtaining CVP's and administration of IV fluids. -500 mL bolus of NS given this morning -Pending ABG, serum lactate, BMP, blood cultures  2. Acute Kidney Injury -Patient is septic though I suspect resulted from contrast induced nephropathy from cath,  ATN from hypotension and UTI/pyelonephritis are probable contributors. Her Creatinine increasing to 2.09 on 10/24/2013, BMP pending this morning. Will continue IV fluid resuscitation, PCCM consulted for central line placement -Follow kidney function -IV fluids  3.  UTI/Urosepsis -Has septic criteria. CXR negative, no other obvious source of infection. Expending antibiotic coverage, will stop Rocephin and start IV Cefepime and Vancomycin -White count continues to increase -Previous urine culture positive for Klebsiella, with sensitivity to Rocephin -Blood cultures obtained, awaiting urine culture results  4.  Chronic Diastolic CHF -Compensated, will monitor her fluid status as she is receiving IV fluids for severe sepsis -Cardiology following  5. Coronary Artery Disease -Cardiac catheterization on 10/14/2013, undergoing bare metal stenting to mid SVG-diagonal and PTCA to SVG-RPDA. -Cardiolgy Following  6. Dispo -Spoke with Dr Martinique, patient's care will be transferred to the Medicine Service.  Code Status: Full Family Communication: Spoke with her husband at bedside   Antibiotics:  Rocephin discontinued  6/21  Vanco started 6/21  Cefepime started 6/21  HPI/Subjective: Patient remains lethargic however is able to answer questions, is speaking to her husband in [place].   Objective: Filed Vitals:   10/25/13 0746  BP: 117/46  Pulse: 94  Temp: 98.2 F (36.8 C)  Resp: 23    Intake/Output Summary (Last 24 hours) at 10/25/13 0810 Last data filed at 10/25/13 0500  Gross per 24 hour  Intake    570 ml  Output    675 ml  Net   -105 ml   Filed Weights   10/30/2013 0700 10/16/2013 0332 10/25/13 0321  Weight: 66.5 kg (146 lb 9.7 oz) 66.4 kg (146 lb 6.2 oz) 69.4 kg (153 lb)    Exam:   General:  Toxic, ill appearing, is arousable, can answer questions  Cardiovascular: Regular rate and rhythm, normal S1S2  Respiratory: Has bibasilar crackles, tachypneic, no wheezes rales or rhonchi  Abdomen: Soft nontender nondistended  Musculoskeletal: No edema noted  Data Reviewed: Basic Metabolic Panel:  Recent Labs Lab 10/12/2013 0350  10/06/2013 0915 10/09/2013 1210 10/23/13 0426 10/23/13 1600 10/24/13 0315  NA  --   < > 136* 137 135* 134* 137  K  --   < > 3.5* 3.6* 3.4* 5.8* 3.7  CL  --   < > 94* 94* 94* 97 95*  CO2  --   --  31 30 27  11* 26  GLUCOSE  --   < > 123* 169* 134* 255* 154*  BUN  --   < > 21 18 19 19  35*  CREATININE  --   < > 0.99 0.99 1.21* 1.80* 2.09*  CALCIUM  --   --  8.8 9.3 9.0 8.2* 8.6  MG 2.2  --   --   --   --   --   --   < > =  values in this interval not displayed. Liver Function Tests: No results found for this basename: AST, ALT, ALKPHOS, BILITOT, PROT, ALBUMIN,  in the last 168 hours No results found for this basename: LIPASE, AMYLASE,  in the last 168 hours No results found for this basename: AMMONIA,  in the last 168 hours CBC:  Recent Labs Lab 10/30/2013 0345  11/02/2013 0200 10/08/2013 1210 10/23/13 0426 10/24/13 0315 10/25/13 0230  WBC 12.2*  --  11.5* 12.7* 18.7* 25.2* 33.3*  NEUTROABS 9.0*  --   --   --   --   --   --   HGB 10.2*  < > 9.3* 10.3*  10.0* 9.3* 9.4*  HCT 32.0*  < > 27.9* 30.7* 30.6* 29.3* 29.7*  MCV 94.7  --  93.6 93.0 93.0 95.1 96.1  PLT 213  --  222 240 279 268 267  < > = values in this interval not displayed. Cardiac Enzymes:  Recent Labs Lab 10/17/2013 0915  TROPONINI 6.18*   BNP (last 3 results)  Recent Labs  10/24/13 0315  PROBNP 4016.0*   CBG:  Recent Labs Lab 10/23/13 1421  GLUCAP 179*    Recent Results (from the past 240 hour(s))  MRSA PCR SCREENING     Status: None   Collection Time    10/20/2013  7:25 AM      Result Value Ref Range Status   MRSA by PCR NEGATIVE  NEGATIVE Final   Comment:            The GeneXpert MRSA Assay (FDA     approved for NASAL specimens     only), is one component of a     comprehensive MRSA colonization     surveillance program. It is not     intended to diagnose MRSA     infection nor to guide or     monitor treatment for     MRSA infections.     Studies: Dg Chest Port 1 View  10/24/2013   CLINICAL DATA:  Tachypnea, ?Fluid overload  EXAM: PORTABLE CHEST - 1 VIEW  COMPARISON:  Portable chest radiograph 10/23/2013  FINDINGS: Low lung volumes. Cardiac silhouette is enlarged. Stable median sternotomy and coronary artery bypass graft changes. Aorta is tortuous and ectatic. There is minimal blunting of the right costophrenic angle. The lungs otherwise clear. Stable calcified granuloma left mid lung. Degenerative changes within the shoulders.  IMPRESSION: Stable minimal right pleural effusion.  No acute cardiopulmonary disease.  Stable chronic changes.   Electronically Signed   By: Margaree Mackintosh M.D.   On: 10/24/2013 17:32   Dg Chest Port 1 View  10/23/2013   CLINICAL DATA:  Hypoxia, fever.  EXAM: PORTABLE CHEST - 1 VIEW  COMPARISON:  October 21, 2013.  FINDINGS: Stable cardiomegaly. Status post coronary artery bypass graft. No pneumothorax is noted. Minimal right pleural effusion is noted. Severe narrowing of right subacromial space is noted concerning for rotator cuff  injury. Old right rib fractures are noted.  IMPRESSION: Minimal right pleural effusion. Stable chronic findings as described above.   Electronically Signed   By: Sabino Dick M.D.   On: 10/23/2013 15:19    Scheduled Meds: . aspirin EC  81 mg Oral Daily  . clopidogrel  75 mg Oral Q breakfast  . conjugated estrogens  1 g Vaginal Once per day on Mon Thu  . divalproex  500 mg Oral TID  . docusate sodium  100 mg Oral BID  . FLUoxetine  10 mg Oral  Daily  . heparin  5,000 Units Subcutaneous 3 times per day  . pantoprazole  40 mg Oral BID AC  . sodium chloride  500 mL Intravenous Once  . sodium chloride  3 mL Intravenous Q12H   Continuous Infusions:    Principal Problem:   NSTEMI (non-ST elevated myocardial infarction) Active Problems:   Syncope   Hx of CABG    Time spent: 45 min    Kelvin Cellar  [facility] Pager [phone]. If 7PM-7AM, please contact night-coverage at [email or web address], password [facility] 10/25/2013, 8:10 AM  LOS: 4 days

## 2013-10-25 NOTE — Progress Notes (Signed)
MRN: [id number] Name: [name]  Sex: female Age: 80 y.o. DOB: 1927-10-15  [place] #: Helene Kelp Facility/Room: 129B Level Of Care: SNF Provider: Inocencio Homes D Emergency Contacts: Extended Emergency Contact Information Primary Emergency Contact: Patriarca,Lev Address: [address]          [place], [place] [address] [place] Phone: [phone] Relation: Spouse Secondary Emergency Contact: Gault,Marina Address: [address] [place]           [place],  [address] [place] Phone: [phone] Relation: Daughter  Code Status: FULL  Allergies: Baclofen; Belladonna; Doxycycline; Iohexol; Metoclopramide; Metronidazole; Nitrofurantoin; Procaine hcl; Sucralfate; Ciprofloxacin; Erythromycin; Gabapentin; Methenamine; Nalbuphine; Penicillins; and Sulfonamide derivatives  Chief Complaint  Patient presents with  . nursing home admission    HPI: Patient is 80 y.o. female who was treated a thospital for UTI and PNA, presenting with MS change , weakness and difficulty with walking who is admitted to SNF for OT/PT.  Past Medical History  Diagnosis Date  . CAD (coronary artery disease)     Catheterization, January, 2007 patent grafts / hospitalization October 2 010 no MI  . Syncope 03/2009    ?orthostasis  . Allergic rhinitis   . History of colon polyps   . Diverticulosis of colon   . GERD (gastroesophageal reflux disease)   . Hyperlipidemia   . Abdominal pain, other specified site     motility disorder, chorinic functional, severe (Dr Olevia Perches). Possible OCD w/complusive use of enemas and laxatives; psychotic features 2011  . Positive H. pylori test 2911  . Psychosomatic disorder     w/GI fixation  . Anxiety   . Depression   . Stress incontinence, female   . Vitamin D deficiency   . Vitamin B12 deficiency   . Renal insufficiency   . UTI (urinary tract infection)   . Hypokalemia     mild  . Insomnia   .  Carotid artery disease     Doppler November, 3329, 5-18% LICA, 84-16% R. ICA.Marland Kitchen increased velocity... possibly from serpentine vessel  . Ejection fraction     50-55%, no wall motion abnormalities, echo, November, 2010  . Edema   . Hemothorax on right   . Breast cancer 2011    Dr Humphrey Rolls- right breast  . Stroke     a. Prior infarct seen on head CT 10/2013.    Past Surgical History  Procedure Laterality Date  . Coronary artery bypass graft    . Myomectomy      Uterine and ovarian  . Breast fibroadenoma surgery    . Hemicolectomy  2005    Left  . Breast lumpectomy  2011  . Right vats (video-assisted thoracoscopic surgery) with        Medication List    Notice   This visit is during an admission. Changes to the med list made in this visit will be reflected in the After Visit Summary of the admission.     No current facility-administered medications on file prior to visit.   Current Outpatient Prescriptions on File Prior to Visit  Medication Sig Dispense Refill  . anastrozole (ARIMIDEX) 1 MG tablet Take 1 mg by mouth daily.      Marland Kitchen aspirin EC 81 MG EC tablet Take 1 tablet (81 mg total) by mouth daily.      Marland Kitchen conjugated estrogens (PREMARIN) vaginal cream Place 0.5 Applicatorfuls vaginally 2 (two) times a week.  42.5 g  5  . diphenhydrAMINE (BENADRYL)  25 mg capsule Take 25 mg by mouth every 6 (six) hours as needed for itching.       . divalproex (DEPAKOTE) 500 MG DR tablet Take 1 tablet (500 mg total) by mouth 3 (three) times daily.  90 tablet  3  . docusate sodium 100 MG CAPS Take 100 mg by mouth 2 (two) times daily.  10 capsule  0  . FLUoxetine (PROZAC) 10 MG capsule Take 10 mg by mouth daily.      . indapamide (LOZOL) 2.5 MG tablet Take 1 tablet (2.5 mg total) by mouth daily.  30 tablet  0  . LORazepam (ATIVAN) 1 MG tablet Take one tablet by mouth every 8 hours as needed for anxiety  90 tablet  5  . methadone (DOLOPHINE) 10 MG tablet Take 1 tablet (10 mg total) by mouth 5 (five)  times daily as needed for severe pain. Continuation of previous prescription for transfer to SNF only.  150 tablet  0  . metoprolol succinate (TOPROL-XL) 100 MG 24 hr tablet Take 1 tablet (100 mg total) by mouth daily. Take with or immediately following a meal.  30 tablet  0  . omeprazole (PRILOSEC) 20 MG capsule Take 1 capsule (20 mg total) by mouth 2 (two) times daily.  180 capsule  3  . promethazine (PHENERGAN) 25 MG tablet Take 1 tablet (25 mg total) by mouth every 8 (eight) hours as needed for nausea.  60 tablet  5  . tamsulosin (FLOMAX) 0.4 MG CAPS Take 1 capsule (0.4 mg total) by mouth daily.  90 capsule  3  . Vitamin D, Ergocalciferol, (DRISDOL) 50000 UNITS CAPS Take 1 capsule (50,000 Units total) by mouth every 30 (thirty) days.  6 capsule  1  . zolpidem (AMBIEN) 10 MG tablet Take one tablet by mouth at bedtime as needed for sleep  30 tablet  5     No orders of the defined types were placed in this encounter.     There is no immunization history on file for this patient.  History  Substance Use Topics  . Smoking status: Never Smoker   . Smokeless tobacco: Never Used  . Alcohol Use: No    Family history is noncontributory    Review of Systems UTO pt speaks only russion;nurses voice no c/o    Filed Vitals:   10/14/13 2052  BP: 113/71  Pulse: 76  Temp: 98.9 F (37.2 C)  Resp: 18    Physical Exam  GENERAL APPEARANCE: Alert, conversant. Appropriately groomed. No acute distress.  SKIN: No diaphoresis rash HEAD: Normocephalic, atraumatic  EYES: Conjunctiva/lids clear. Pupils round, reactive. EOMs intact.  EARS: External exam WNL, canals clear. Hearing grossly normal.  NOSE: No deformity or discharge.  MOUTH/THROAT: Lips w/o lesions  RESPIRATORY: Breathing is even, unlabored. Lung sounds are clear   CARDIOVASCULAR: Heart RRR no murmurs, rubs or gallops. No peripheral edema.  GASTROINTESTINAL: Abdomen is soft, non-tender, not distended w/ normal bowel  sounds GENITOURINARY: Bladder non tender, not distended  MUSCULOSKELETAL: No abnormal joints or musculature NEUROLOGIC:  Cranial nerves 2-12 grossly intact. Moves all extremities  PSYCHIATRIC: Mood and affect appropriate to situation, no behavioral issues  Patient Active Problem List   Diagnosis Date Noted  . Septic shock 10/25/2013  . NSTEMI (non-ST elevated myocardial infarction) 10/23/2013  . Aspiration pneumonia 10/10/2013  . Acute encephalopathy 10/08/2013  . UTI (urinary tract infection) 10/08/2013  . NSVT (nonsustained ventricular tachycardia) 10/08/2013  . Osteopenia 09/11/2013  . Facial contusion 10/01/2012  .  Actinic keratoses 10/01/2012  . Wart viral 10/01/2012  . Vaginitis and vulvovaginitis 09/06/2011  . Spasm 08/14/2011  . Anemia, iron deficiency 01/26/2011  . Hemothorax on right   . Edema   . CAD (coronary artery disease)   . Hx of CABG   . Carotid artery disease   . Ejection fraction   . BREAST CANCER, HX OF 04/17/2010  . LUMP OR MASS IN BREAST 03/27/2010  . INSOMNIA, CHRONIC 11/08/2009  . HYPONATREMIA 10/26/2009  . PSYCHOSIS 10/26/2009  . OBSESSIVE-COMPULSIVE DISORDER 10/26/2009  . FREQUENCY, URINARY 10/26/2009  . HELICOBACTER PYLORI INFECTION 08/31/2009  . DYSPHAGIA UNSPECIFIED 07/07/2009  . Syncope 03/07/2009  . URINARY RETENTION 12/17/2008  . HYPOTENSION 10/19/2008  . CHEST PAIN, SUBSTERNAL 08/13/2008  . LEG PAIN 04/16/2008  . RENAL INSUFFICIENCY 03/30/2008  . CYSTITIS 01/23/2008  . ABDOMINAL PAIN, RIGHT UPPER QUADRANT 10/03/2007  . HYPOKALEMIA 08/27/2007  . Chronic pain syndrome 08/08/2007  . TROCHANTERIC BURSITIS 08/08/2007  . SINUS BRADYCARDIA 03/31/2007  . ANXIETY 03/02/2007  . DEPRESSION 03/02/2007  . SYMPTOM, MEMORY LOSS 02/27/2007  . DEPENDENCE, DRUG NOS, CONTINUOUS 01/28/2007  . B12 DEFICIENCY 11/27/2006  . Unspecified vitamin D deficiency 11/27/2006  . HYPERLIPIDEMIA 11/27/2006  . ALLERGIC RHINITIS 11/27/2006  . GERD 11/27/2006  .  DIVERTICULOSIS, COLON 11/27/2006  . PRURITUS 11/27/2006  . URTICARIA 11/27/2006  . STRESS INCONTINENCE 11/27/2006  . ABDOMINAL PAIN, CHRONIC 11/27/2006  . COLONIC POLYPS, HX OF 11/27/2006  . BENIGN NEOPLASM OF COLON 08/26/2003  . HIATAL HERNIA 08/26/2003    CBC    Component Value Date/Time   WBC 33.3* 10/25/2013 0230   WBC 7.6 09/01/2012 1112   RBC 3.09* 10/25/2013 0230   RBC 3.47* 09/01/2012 1112   HGB 9.4* 10/25/2013 0230   HGB 11.1* 09/01/2012 1112   HCT 29.7* 10/25/2013 0230   HCT 32.8* 09/01/2012 1112   PLT 267 10/25/2013 0230   PLT 169 09/01/2012 1112   MCV 96.1 10/25/2013 0230   MCV 94.6 09/01/2012 1112   LYMPHSABS 1.3 10/17/2013 0345   LYMPHSABS 1.1 09/01/2012 1112   MONOABS 1.7* 10/20/2013 0345   MONOABS 0.7 09/01/2012 1112   EOSABS 0.2 10/19/2013 0345   EOSABS 0.7* 09/01/2012 1112   BASOSABS 0.0 10/05/2013 0345   BASOSABS 0.0 09/01/2012 1112    CMP     Component Value Date/Time   NA 140 10/25/2013 0805   NA 140 09/01/2012 1112   K 3.5* 10/25/2013 0805   K 4.2 09/01/2012 1112   CL 100 10/25/2013 0805   CL 106 09/01/2012 1112   CO2 25 10/25/2013 0805   CO2 26 09/01/2012 1112   GLUCOSE 184* 10/25/2013 0805   GLUCOSE 97 09/01/2012 1112   BUN 41* 10/25/2013 0805   BUN 22.1 09/01/2012 1112   CREATININE 1.60* 10/25/2013 0805   CREATININE 1.4* 09/01/2012 1112   CALCIUM 8.7 10/25/2013 0805   CALCIUM 8.9 09/01/2012 1112   PROT 6.6 10/09/2013 0554   PROT 6.6 09/01/2012 1112   ALBUMIN 3.3* 10/09/2013 0554   ALBUMIN 3.3* 09/01/2012 1112   AST 27 10/09/2013 0554   AST 17 09/01/2012 1112   ALT 16 10/09/2013 0554   ALT 12 09/01/2012 1112   ALKPHOS 161* 10/09/2013 0554   ALKPHOS 160* 09/01/2012 1112   BILITOT 0.4 10/09/2013 0554   BILITOT 0.34 09/01/2012 1112   GFRNONAA 29* 10/25/2013 0805   GFRAA 33* 10/25/2013 0805    Assessment and Plan  Acute encephalopathy likely due to UTI superimposed on underlying dementia and psychotic tendencies. Episode  of obtundation which is likely due to multiple sedating  medications. Her mentation rapidly improved with treatment of her urinary tract infection a resumption of her home medications.  - MRI brain: Negative for stroke  - TSH 2.6, RPR NR, B12 380  - Start B12 supplementation  - Folate pending  - LFTs wnl, ammonia 26  - ABG consistent with respiratory alkalosis, no hypercapenia   UTI (urinary tract infection) present on admission. Started on enteric ceftriaxone and transitioned to Keflex for discharge. Plan to complete a seven-day course. SNF to please follow up on pending urine culture, however, patient clinically improved after starting antibiotics   Aspiration pneumonia She developed a transient low grade fever to 100.28F on 6/5 overnight. Since she was clincally improving with ceftriaxone for her UTI, it was felt that resistent UTI was not the cause. Since she was obtunded the day prior, there was suspicion for aspiration, which was confirmed by CXR which demonstrated a RUL opacity. Due to multiple medication allergies, she was unable to use combination keflex/flagyl, so she was started on clindamycin and florastor in addition to her keflex. Oxygen saturation stable on room air.    NSVT (nonsustained ventricular tachycardia) Patient remained asymptomatic. EKG does not show any clear-cut ischemic changes.  - troponings neg  - Potassium and magnesium wnl  - ECHO demonstrated ejection fraction of 55-60% with inferolateral hypokinesis, grade 1 diastolic dysfunction.  - BB increased by cardiology  - Telemetry: PVC, NSR  - Recommend followup with cardiology for further evaluation of possible CAD if indicated   PSYCHOSIS she frequently has transient behavioral disturbance when she has UTIs. She has been compliant with her medications. Continued her home medications including Depakote, Risperdal, fluoxetine. Haldol may have contributed to extrapyramidal symptoms with tongue thrusting so this prn medication was discontinued   DEPRESSION Same as  psychosis above  CAD (coronary artery disease) And HTN ;she frequently has transient behavioral disturbance when she has UTIs. She has been compliant with her medications. Continued her home medications including Depakote, Risperdal, fluoxetine. Haldol may have contributed to extrapyramidal symptoms with tongue thrusting so this prn medication was discontinued   BREAST CANCER, HX OF Continue arimidex    Hennie Duos, MD

## 2013-10-25 NOTE — Assessment & Plan Note (Signed)
present on admission. Started on enteric ceftriaxone and transitioned to Keflex for discharge. Plan to complete a seven-day course. SNF to please follow up on pending urine culture, however, patient clinically improved after starting antibiotics

## 2013-10-25 NOTE — Progress Notes (Signed)
ANTIBIOTIC CONSULT NOTE - INITIAL  Pharmacy Consult for vanc/cefepime Indication: sepsis  Allergies  Allergen Reactions  . Baclofen     REACTION: confusion  . Belladonna Other (See Comments)    unknown  . Doxycycline Other (See Comments)    unknown  . Iohexol      Code: RASH   . Metoclopramide     Clonic spasms  . Metronidazole     REACTION: rash  . Nitrofurantoin     REACTION: nausea  . Procaine Hcl   . Sucralfate     REACTION: nausea  . Ciprofloxacin Itching and Rash  . Erythromycin Rash and Other (See Comments)    fever  . Gabapentin Rash  . Methenamine Rash  . Nalbuphine Rash  . Penicillins Rash  . Sulfonamide Derivatives Rash    Patient Measurements: Height: 5\' 2"  (157.5 cm) Weight: 153 lb (69.4 kg) IBW/kg (Calculated) : 50.1  Vital Signs: Temp: 98.2 F (36.8 C) (06/21 0746) Temp src: Oral (06/21 0746) BP: 117/46 mmHg (06/21 0746) Pulse Rate: 94 (06/21 0746) Intake/Output from previous day: 06/20 0701 - 06/21 0700 In: 690 [P.O.:440; IV Piggyback:250] Out: 675 [Urine:675] Intake/Output from this shift:    Labs:  Recent Labs  10/23/13 0426 10/23/13 1600 10/24/13 0315 10/25/13 0230  WBC 18.7*  --  25.2* 33.3*  HGB 10.0*  --  9.3* 9.4*  PLT 279  --  268 267  CREATININE 1.21* 1.80* 2.09*  --    Estimated Creatinine Clearance: 18.6 ml/min (by C-G formula based on Cr of 2.09). No results found for this basename: Letta Median, VANCORANDOM, GENTTROUGH, GENTPEAK, GENTRANDOM, TOBRATROUGH, TOBRAPEAK, TOBRARND, AMIKACINPEAK, AMIKACINTROU, AMIKACIN,  in the last 72 hours   Microbiology: Recent Results (from the past 720 hour(s))  URINE CULTURE     Status: None   Collection Time    10/08/13  9:01 AM      Result Value Ref Range Status   Specimen Description URINE, RANDOM   Final   Special Requests NONE   Final   Culture  Setup Time     Final   Value: 10/08/2013 10:15     Performed at [facility]     Final   Value:  >=100,000 COLONIES/ML     Performed at [facility]   Culture     Final   Value: KLEBSIELLA PNEUMONIAE     Performed at [facility]   Report Status 10/10/2013 FINAL   Final   Organism ID, Bacteria KLEBSIELLA PNEUMONIAE   Final  MRSA PCR SCREENING     Status: None   Collection Time    11/02/2013  7:25 AM      Result Value Ref Range Status   MRSA by PCR NEGATIVE  NEGATIVE Final   Comment:            The GeneXpert MRSA Assay (FDA     approved for NASAL specimens     only), is one component of a     comprehensive MRSA colonization     surveillance program. It is not     intended to diagnose MRSA     infection nor to guide or     monitor treatment for     MRSA infections.    Medical History: Past Medical History  Diagnosis Date  . CAD (coronary artery disease)     Catheterization, January, 2007 patent grafts / hospitalization October 2 010 no MI  . Syncope 03/2009    ?orthostasis  .  Allergic rhinitis   . History of colon polyps   . Diverticulosis of colon   . GERD (gastroesophageal reflux disease)   . Hyperlipidemia   . Abdominal pain, other specified site     motility disorder, chorinic functional, severe (Dr Olevia Perches). Possible OCD w/complusive use of enemas and laxatives; psychotic features 2011  . Positive H. pylori test 2911  . Psychosomatic disorder     w/GI fixation  . Anxiety   . Depression   . Stress incontinence, female   . Vitamin D deficiency   . Vitamin B12 deficiency   . Renal insufficiency   . UTI (urinary tract infection)   . Hypokalemia     mild  . Insomnia   . Carotid artery disease     Doppler November, 2505, 3-97% LICA, 67-34% R. ICA.Marland Kitchen increased velocity... possibly from serpentine vessel  . Ejection fraction     50-55%, no wall motion abnormalities, echo, November, 2010  . Edema   . Hemothorax on right   . Breast cancer 2011    Dr Humphrey Rolls- right breast  . Stroke     a. Prior infarct seen on head CT 10/2013.    Medications:   Scheduled:  . aspirin EC  81 mg Oral Daily  . clopidogrel  75 mg Oral Q breakfast  . conjugated estrogens  1 g Vaginal Once per day on Mon Thu  . divalproex  500 mg Oral TID  . docusate sodium  100 mg Oral BID  . FLUoxetine  10 mg Oral Daily  . heparin  5,000 Units Subcutaneous 3 times per day  . pantoprazole  40 mg Oral BID AC  . sodium chloride  500 mL Intravenous Once  . sodium chloride  3 mL Intravenous Q12H   Infusions:   Assessment: 85 yoF to start vanc and cefepime for sepsis.  WBC trending up.  Patient's renal function declining.     Goal of Therapy:  Vancomycin trough level 15-20 mcg/ml  Plan:  Start Vanc 1g Q48H  Start Cefepime 500mg  Q24H Monitor renal function, clinical course, and levels when appropriate  Thank you, Vivia Ewing, PharmD Clinical Pharmacist - Resident Pager: [phone] Pharmacy: [phone] 10/25/2013 8:28 AM

## 2013-10-25 NOTE — Assessment & Plan Note (Signed)
Same as psychosis above

## 2013-10-25 NOTE — Progress Notes (Signed)
ANTICOAGULATION CONSULT NOTE - Initial Consult  Pharmacy Consult for heparin Indication: atrial fibrillation  Allergies  Allergen Reactions  . Baclofen     REACTION: confusion  . Belladonna Other (See Comments)    unknown  . Doxycycline Other (See Comments)    unknown  . Iohexol      Code: RASH   . Metoclopramide     Clonic spasms  . Metronidazole     REACTION: rash  . Nitrofurantoin     REACTION: nausea  . Procaine Hcl   . Sucralfate     REACTION: nausea  . Ciprofloxacin Itching and Rash  . Erythromycin Rash and Other (See Comments)    fever  . Gabapentin Rash  . Methenamine Rash  . Nalbuphine Rash  . Penicillins Rash  . Sulfonamide Derivatives Rash    Patient Measurements: Height: 5\' 2"  (157.5 cm) Weight: 153 lb (69.4 kg) IBW/kg (Calculated) : 50.1  Vital Signs: Temp: 98.2 F (36.8 C) (06/21 0746) Temp src: Oral (06/21 0746) BP: 83/47 mmHg (06/21 1015) Pulse Rate: 95 (06/21 0900)  Labs:  Recent Labs  10/23/13 0426 10/23/13 1600 10/24/13 0315 10/25/13 0230 10/25/13 0805  HGB 10.0*  --  9.3* 9.4*  --   HCT 30.6*  --  29.3* 29.7*  --   PLT 279  --  268 267  --   CREATININE 1.21* 1.80* 2.09*  --  1.60*    Estimated Creatinine Clearance: 24.3 ml/min (by C-G formula based on Cr of 1.6).   Medical History: Past Medical History  Diagnosis Date  . CAD (coronary artery disease)     Catheterization, January, 2007 patent grafts / hospitalization October 2 010 no MI  . Syncope 03/2009    ?orthostasis  . Allergic rhinitis   . History of colon polyps   . Diverticulosis of colon   . GERD (gastroesophageal reflux disease)   . Hyperlipidemia   . Abdominal pain, other specified site     motility disorder, chorinic functional, severe (Dr Olevia Perches). Possible OCD w/complusive use of enemas and laxatives; psychotic features 2011  . Positive H. pylori test 2911  . Psychosomatic disorder     w/GI fixation  . Anxiety   . Depression   . Stress incontinence,  female   . Vitamin D deficiency   . Vitamin B12 deficiency   . Renal insufficiency   . UTI (urinary tract infection)   . Hypokalemia     mild  . Insomnia   . Carotid artery disease     Doppler November, 1610, 9-60% LICA, 45-40% R. ICA.Marland Kitchen increased velocity... possibly from serpentine vessel  . Ejection fraction     50-55%, no wall motion abnormalities, echo, November, 2010  . Edema   . Hemothorax on right   . Breast cancer 2011    Dr Humphrey Rolls- right breast  . Stroke     a. Prior infarct seen on head CT 10/2013.    Medications:  Scheduled:  . amiodarone      . amiodarone  150 mg Intravenous Once  . aspirin EC  81 mg Oral Daily  . ceFEPime (MAXIPIME) IV  500 mg Intravenous Q24H  . clopidogrel  75 mg Oral Q breakfast  . conjugated estrogens  1 g Vaginal Once per day on Mon Thu  . divalproex  500 mg Oral TID  . docusate sodium  100 mg Oral BID  . FLUoxetine  10 mg Oral Daily  . pantoprazole  40 mg Oral BID AC  . sodium chloride  500 mL Intravenous Once  . sodium chloride  3 mL Intravenous Q12H  . vancomycin  1,000 mg Intravenous Q48H   Infusions:  . amiodarone     Followed by  . amiodarone      Assessment: 63 yoF now to begin heparin for new onset afib.  Patient was previously on heparin in this admission for NSTEMI. Heparin rate was 1150units/hr when it was discontinued.  H/H is low but stable and renal function is declining.    Goal of Therapy:  Heparin level 0.3-0.7 units/ml Monitor platelets by anticoagulation protocol: Yes   Plan:  Heparin 3000 unit bolus, then start drip at 1150units/hr.   Check 8 hr heparin level Daily HL and CBC Monitor for bleeding  Thank you, Vivia Ewing, PharmD Clinical Pharmacist - Resident Pager: [phone] Pharmacy: [phone] 10/25/2013 10:27 AM

## 2013-10-25 NOTE — Assessment & Plan Note (Signed)
she frequently has transient behavioral disturbance when she has UTIs. She has been compliant with her medications. Continued her home medications including Depakote, Risperdal, fluoxetine. Haldol may have contributed to extrapyramidal symptoms with tongue thrusting so this prn medication was discontinued

## 2013-10-25 NOTE — Progress Notes (Signed)
TELEMETRY: Reviewed telemetry pt in atrial fibrillation with RVR 136 bpm. Just started.: Filed Vitals:   10/25/13 0800 10/25/13 0830 10/25/13 0900 10/25/13 0935  BP:  105/34 86/55 77/24   Pulse: 90 93 95   Temp:      TempSrc:      Resp: 28 28 26  35  Height:      Weight:      SpO2: 96% 96% 97% 96%    Intake/Output Summary (Last 24 hours) at 10/25/13 0956 Last data filed at 10/25/13 0926  Gross per 24 hour  Intake    620 ml  Output    675 ml  Net    -55 ml   Filed Weights   10/09/2013 0700 10/21/2013 0332 10/25/13 0321  Weight: 146 lb 9.7 oz (66.5 kg) 146 lb 6.2 oz (66.4 kg) 153 lb (69.4 kg)    Subjective  Husband at bedside. Denies chest pain or SOB. No appetite. Wanting methadone per husband.   Marland Kitchen amiodarone  150 mg Intravenous Once  . aspirin EC  81 mg Oral Daily  . ceFEPime (MAXIPIME) IV  500 mg Intravenous Q24H  . clopidogrel  75 mg Oral Q breakfast  . conjugated estrogens  1 g Vaginal Once per day on Mon Thu  . divalproex  500 mg Oral TID  . docusate sodium  100 mg Oral BID  . FLUoxetine  10 mg Oral Daily  . pantoprazole  40 mg Oral BID AC  . sodium chloride  500 mL Intravenous Once  . sodium chloride  3 mL Intravenous Q12H  . vancomycin  1,000 mg Intravenous Q48H   . amiodarone     Followed by  . amiodarone      LABS: Basic Metabolic Panel:  Recent Labs  10/24/13 0315 10/25/13 0805  NA 137 140  K 3.7 3.5*  CL 95* 100  CO2 26 25  GLUCOSE 154* 184*  BUN 35* 41*  CREATININE 2.09* 1.60*  CALCIUM 8.6 8.7   Liver Function Tests: No results found for this basename: AST, ALT, ALKPHOS, BILITOT, PROT, ALBUMIN,  in the last 72 hours No results found for this basename: LIPASE, AMYLASE,  in the last 72 hours CBC:  Recent Labs  10/24/13 0315 10/25/13 0230  WBC 25.2* 33.3*  HGB 9.3* 9.4*  HCT 29.3* 29.7*  MCV 95.1 96.1  PLT 268 267   Cardiac Enzymes: No results found for this basename: CKTOTAL, CKMB, CKMBINDEX, TROPONINI,  in the last 72  hours BNP:  Recent Labs  10/24/13 0315  PROBNP 4016.0*   D-Dimer: No results found for this basename: DDIMER,  in the last 72 hours Hemoglobin A1C: No results found for this basename: HGBA1C,  in the last 72 hours Fasting Lipid Panel: No results found for this basename: CHOL, HDL, LDLCALC, TRIG, CHOLHDL, LDLDIRECT,  in the last 72 hours Thyroid Function Tests: No results found for this basename: TSH, T4TOTAL, FREET3, T3FREE, THYROIDAB,  in the last 72 hours  Urinalysis    Component Value Date/Time   COLORURINE AMBER* 10/23/2013 1500   APPEARANCEUR CLOUDY* 10/23/2013 1500   LABSPEC 1.015 10/23/2013 1500   PHURINE 5.0 10/23/2013 1500   GLUCOSEU NEGATIVE 10/23/2013 1500   GLUCOSEU NEGATIVE 10/26/2009 1148   HGBUR SMALL* 10/23/2013 1500   BILIRUBINUR SMALL* 10/23/2013 1500   KETONESUR 15* 10/23/2013 1500   PROTEINUR 30* 10/23/2013 1500   UROBILINOGEN 0.2 10/23/2013 1500   NITRITE POSITIVE* 10/23/2013 1500   LEUKOCYTESUR MODERATE* 10/23/2013 1500  Radiology/Studies:  Dg Chest Port 1 View  10/24/2013   CLINICAL DATA:  Tachypnea, ?Fluid overload  EXAM: PORTABLE CHEST - 1 VIEW  COMPARISON:  Portable chest radiograph 10/23/2013  FINDINGS: Low lung volumes. Cardiac silhouette is enlarged. Stable median sternotomy and coronary artery bypass graft changes. Aorta is tortuous and ectatic. There is minimal blunting of the right costophrenic angle. The lungs otherwise clear. Stable calcified granuloma left mid lung. Degenerative changes within the shoulders.  IMPRESSION: Stable minimal right pleural effusion.  No acute cardiopulmonary disease.  Stable chronic changes.   Electronically Signed   By: Margaree Mackintosh M.D.   On: 10/24/2013 17:32   Dg Chest Port 1 View  10/23/2013   CLINICAL DATA:  Hypoxia, fever.  EXAM: PORTABLE CHEST - 1 VIEW  COMPARISON:  October 21, 2013.  FINDINGS: Stable cardiomegaly. Status post coronary artery bypass graft. No pneumothorax is noted. Minimal right pleural effusion is  noted. Severe narrowing of right subacromial space is noted concerning for rotator cuff injury. Old right rib fractures are noted.  IMPRESSION: Minimal right pleural effusion. Stable chronic findings as described above.   Electronically Signed   By: Sabino Dick M.D.   On: 10/23/2013 15:19   Ecg: NSR with LVH. Old inferior MI.  PHYSICAL EXAM General: Well developed, elderly, in no acute distress. Alert and conversing in [place] with spouse Head: Normocephalic, atraumatic, sclera non-icteric, oropharynx is clear Neck: Negative for carotid bruits. JVD not elevated. No adenopathy Lungs: Clear bilaterally to auscultation without wheezes, rales, or rhonchi. Breathing is unlabored. Heart: IRRR, tachy, S1 S2 without murmurs, rubs, or gallops.  Abdomen: Soft, non-tender, non-distended with normoactive bowel sounds. Extremities: No clubbing, cyanosis or edema.  Distal pedal pulses are 2+ and equal bilaterally. Left radial and right groin cath sites without hematoma.  Neuro: alert and oriented today. Moves all extremities.   ASSESSMENT AND PLAN: 1. NSTEMI/Unstable angina. S/p CABG. S/p BMS of SVG to diagonal and SVG to PDA 10/18/2013. Continue ASA and Plavix. No recurrent angina.   2. Atrial fibrillation with RVR. New onset. Associated with worsening hypotension. Will give 500 cc IV saline bolus. Load with IV amiodarone. Start IV heparin per pharmacy. If hemodynamics do not improve may need emergent DC cardioversion.  3. Anemia- chronic and stable.  4. Severe sepsis/UTI. Hypotensive with rising WBC, increased respiratory distress. Per primary care will broaden antibiotic coverage. Consider central line for access and to monitor CVPs to help with volume management.   5. ARF. Creatinine increased from 0.99>1.8>2.09. Down to 1.6 today.Probably related to contrast induced nephropathy. Received 250 cc of contrast with PCI. Also component of ATN with hypotension/sepsis. Other contributing factors include UTI  and urinary retention.  Follow BMET daily. Lozol discontinued.  6. Acute encephalopathy. Secondary to sepsis and oversedation with meds. All sedatives held except methadone.   7. Chronic diastolic CHF. Needs volume now. Oxygenating well.   CCM consulted. Hospitalist service to assume care and cardiology will consult.    Present on Admission:  . Syncope . NSTEMI (non-ST elevated myocardial infarction)  Signed, [name], [place] 10/25/2013 9:56 AM

## 2013-10-25 NOTE — Assessment & Plan Note (Signed)
likely due to UTI superimposed on underlying dementia and psychotic tendencies. Episode of obtundation which is likely due to multiple sedating medications. Her mentation rapidly improved with treatment of her urinary tract infection a resumption of her home medications.  - MRI brain: Negative for stroke  - TSH 2.6, RPR NR, B12 380  - Start B12 supplementation  - Folate pending  - LFTs wnl, ammonia 26  - ABG consistent with respiratory alkalosis, no hypercapenia

## 2013-10-25 NOTE — Progress Notes (Signed)
Pt has chronic per husband (on methadone) holding methadone per instructions as much as possible secondary to lethargy, ?sepsis.

## 2013-10-26 ENCOUNTER — Inpatient Hospital Stay (HOSPITAL_COMMUNITY): Payer: Medicare Other

## 2013-10-26 DIAGNOSIS — N10 Acute tubulo-interstitial nephritis: Secondary | ICD-10-CM

## 2013-10-26 DIAGNOSIS — I959 Hypotension, unspecified: Secondary | ICD-10-CM

## 2013-10-26 LAB — COMPREHENSIVE METABOLIC PANEL
ALT: 17 U/L (ref 0–35)
AST: 29 U/L (ref 0–37)
Albumin: 1.4 g/dL — ABNORMAL LOW (ref 3.5–5.2)
Alkaline Phosphatase: 126 U/L — ABNORMAL HIGH (ref 39–117)
BUN: 40 mg/dL — AB (ref 6–23)
CALCIUM: 7.9 mg/dL — AB (ref 8.4–10.5)
CO2: 20 meq/L (ref 19–32)
CREATININE: 1.4 mg/dL — AB (ref 0.50–1.10)
Chloride: 100 mEq/L (ref 96–112)
GFR, EST AFRICAN AMERICAN: 39 mL/min — AB (ref >90)
GFR, EST NON AFRICAN AMERICAN: 34 mL/min — AB (ref >90)
Glucose, Bld: 170 mg/dL — ABNORMAL HIGH (ref 70–99)
Potassium: 3.3 mEq/L — ABNORMAL LOW (ref 3.7–5.3)
Sodium: 135 mEq/L — ABNORMAL LOW (ref 137–147)
TOTAL PROTEIN: 4.9 g/dL — AB (ref 6.0–8.3)
Total Bilirubin: 0.2 mg/dL — ABNORMAL LOW (ref 0.3–1.2)

## 2013-10-26 LAB — CLOSTRIDIUM DIFFICILE BY PCR: CDIFFPCR: POSITIVE — AB

## 2013-10-26 LAB — CBC
HCT: 26.2 % — ABNORMAL LOW (ref 36.0–46.0)
HEMOGLOBIN: 8.5 g/dL — AB (ref 12.0–15.0)
MCH: 30.9 pg (ref 26.0–34.0)
MCHC: 32.4 g/dL (ref 30.0–36.0)
MCV: 95.3 fL (ref 78.0–100.0)
Platelets: 238 10*3/uL (ref 150–400)
RBC: 2.75 MIL/uL — AB (ref 3.87–5.11)
RDW: 14.1 % (ref 11.5–15.5)
WBC: 35.4 10*3/uL — ABNORMAL HIGH (ref 4.0–10.5)

## 2013-10-26 LAB — PROCALCITONIN: PROCALCITONIN: 0.65 ng/mL

## 2013-10-26 LAB — HEPARIN LEVEL (UNFRACTIONATED)
HEPARIN UNFRACTIONATED: 0.17 [IU]/mL — AB (ref 0.30–0.70)
Heparin Unfractionated: 0.21 IU/mL — ABNORMAL LOW (ref 0.30–0.70)

## 2013-10-26 MED ORDER — AMIODARONE HCL 200 MG PO TABS
400.0000 mg | ORAL_TABLET | Freq: Two times a day (BID) | ORAL | Status: DC
  Administered 2013-10-26 – 2013-10-27 (×4): 400 mg via ORAL
  Filled 2013-10-26 (×6): qty 2

## 2013-10-26 MED ORDER — POTASSIUM CHLORIDE CRYS ER 20 MEQ PO TBCR
40.0000 meq | EXTENDED_RELEASE_TABLET | Freq: Two times a day (BID) | ORAL | Status: AC
  Administered 2013-10-26: 40 meq via ORAL
  Administered 2013-10-26: 20 meq via ORAL
  Filled 2013-10-26 (×2): qty 2

## 2013-10-26 MED ORDER — HEPARIN (PORCINE) IN NACL 100-0.45 UNIT/ML-% IJ SOLN
1600.0000 [IU]/h | INTRAMUSCULAR | Status: DC
  Administered 2013-10-27: 1500 [IU]/h via INTRAVENOUS
  Administered 2013-10-28: 1550 [IU]/h via INTRAVENOUS
  Filled 2013-10-26 (×5): qty 250

## 2013-10-26 MED ORDER — VANCOMYCIN 50 MG/ML ORAL SOLUTION
125.0000 mg | Freq: Four times a day (QID) | ORAL | Status: DC
  Filled 2013-10-26 (×4): qty 2.5

## 2013-10-26 MED ORDER — VANCOMYCIN HCL IN DEXTROSE 1-5 GM/200ML-% IV SOLN
1000.0000 mg | INTRAVENOUS | Status: DC
  Filled 2013-10-26 (×2): qty 200

## 2013-10-26 MED ORDER — FIDAXOMICIN 200 MG PO TABS
200.0000 mg | ORAL_TABLET | Freq: Two times a day (BID) | ORAL | Status: DC
  Administered 2013-10-26 – 2013-10-27 (×3): 200 mg via ORAL
  Filled 2013-10-26 (×6): qty 1

## 2013-10-26 NOTE — Progress Notes (Signed)
RN assessed change in pt's cardiac rhythm. EKG performed. Pt back in A-Fib from NSR with a rate of 110-120. Highest assessed HR is ~130s. E-link RN notified. Pt given scheduled Amio 400mg  PO, pt already on heparin drip. Will continue to assess and monitor pt closely.

## 2013-10-26 NOTE — Progress Notes (Addendum)
ANTICOAGULATION & ANTIBIOTIC CONSULT NOTE - [place] for heparin Indication: atrial fibrillation  Allergies  Allergen Reactions  . Baclofen     REACTION: confusion  . Belladonna Other (See Comments)    unknown  . Doxycycline Other (See Comments)    unknown  . Iohexol      Code: RASH   . Metoclopramide     Clonic spasms  . Metronidazole     REACTION: rash  . Nitrofurantoin     REACTION: nausea  . Procaine Hcl   . Sucralfate     REACTION: nausea  . Ciprofloxacin Itching and Rash  . Erythromycin Rash and Other (See Comments)    fever  . Gabapentin Rash  . Methenamine Rash  . Nalbuphine Rash  . Penicillins Rash  . Sulfonamide Derivatives Rash    Patient Measurements: Height: 5\' 2"  (157.5 cm) Weight: 153 lb (69.4 kg) IBW/kg (Calculated) : 50.1  Vital Signs: Temp: 98.9 F (37.2 C) (06/22 0724) Temp src: Oral (06/22 0724) BP: 107/50 mmHg (06/22 0800) Pulse Rate: 78 (06/22 0724)  Labs:  Recent Labs  10/24/13 0315 10/25/13 0230 10/25/13 0805 10/25/13 1900 10/26/13 0410  HGB 9.3* 9.4*  --   --  8.5*  HCT 29.3* 29.7*  --   --  26.2*  PLT 268 267  --   --  238  HEPARINUNFRC  --   --   --  0.25* 0.17*  CREATININE 2.09*  --  1.60*  --  1.40*    Estimated Creatinine Clearance: 27.8 ml/min (by C-G formula based on Cr of 1.4).   Medical History: Past Medical History  Diagnosis Date  . CAD (coronary artery disease)     Catheterization, January, 2007 patent grafts / hospitalization October 2 010 no MI  . Syncope 03/2009    ?orthostasis  . Allergic rhinitis   . History of colon polyps   . Diverticulosis of colon   . GERD (gastroesophageal reflux disease)   . Hyperlipidemia   . Abdominal pain, other specified site     motility disorder, chorinic functional, severe (Dr Olevia Perches). Possible OCD w/complusive use of enemas and laxatives; psychotic features 2011  . Positive H. pylori test 2911  . Psychosomatic disorder     w/GI fixation   . Anxiety   . Depression   . Stress incontinence, female   . Vitamin D deficiency   . Vitamin B12 deficiency   . Renal insufficiency   . UTI (urinary tract infection)   . Hypokalemia     mild  . Insomnia   . Carotid artery disease     Doppler November, 7846, 9-62% LICA, 95-28% R. ICA.Marland Kitchen increased velocity... possibly from serpentine vessel  . Ejection fraction     50-55%, no wall motion abnormalities, echo, November, 2010  . Edema   . Hemothorax on right   . Breast cancer 2011    Dr Humphrey Rolls- right breast  . Stroke     a. Prior infarct seen on head CT 10/2013.    Medications:  Scheduled:  . aspirin EC  81 mg Oral Daily  . ceFEPime (MAXIPIME) IV  500 mg Intravenous Q24H  . clopidogrel  75 mg Oral Q breakfast  . conjugated estrogens  1 g Vaginal Once per day on Mon Thu  . divalproex  500 mg Oral TID  . docusate sodium  100 mg Oral BID  . FLUoxetine  10 mg Oral Daily  . pantoprazole  40 mg Oral BID AC  .  sodium chloride  3 mL Intravenous Q12H  . vancomycin  1,000 mg Intravenous Q48H   Infusions:  . amiodarone 30 mg/hr (10/26/13 0909)  . heparin      Assessment: 85 yoF on IV heparin for new onset afib.  Patient was previously on heparin in this admission for NSTEMI. Heparin level subtherapeutic at 0.17 this morning.  CBC and platelet count fairly stable.   Also on empiric cefepime and vancomycin for suspected urosepsis.  Scr improving today, estimated CrCl ~ 27 ml/min.  Blood Cx with NGTD, UCx still pending.   Vanc 6/21>> Cefepime 6/21>>  6/21 BCx2>>ngtd 6/21 UCx >>pending  Goal of Therapy:  Heparin level 0.3-0.7 units/ml Monitor platelets by anticoagulation protocol: Yes   Plan:  1. Increase IV heparin to 1350 units/hr, Recheck heparin level in 8 hrs. 2. Daily HL and CBC 3. Monitor for bleeding 4. F/u plans for long-term anticoagulation. 5. Increase vancomycin to 1g IV q 24 hrs 6. Continue cefepime 500 mg IV q 24 hrs for now, but will likely need increased dose  if renal function continues to improve.  Uvaldo Rising, BCPS  Clinical Pharmacist Pager [phone]  10/26/2013 9:55 AM

## 2013-10-26 NOTE — Progress Notes (Signed)
CARDIAC REHAB PHASE I   PRE:  Rate/Rhythm: 88 SR  BP:  Supine:   Sitting: 91/55  Standing:    SaO2: 98% 2L  MODE:  Ambulation: from chair to [facility] and then to bed     ft   POST:  Rate/Rhythm: 88  BP:  Supine: 113/51  Sitting:   Standing:    SaO2: 98%2L 1055- 1130 Stood pt for RN to clean bottom. Pt incontinent of stool and we pivoted to [facility]. Pt did not have BM until we stood pt again to clean and then incontinent of stool. Pt was following commands and then on return to walk to bed, pt would not follow commands. Would not back up to bed. Had to assist to sitting on bed and pick up legs. By that time, pt would not respond to [place]. She was grunting slighty. Sats and BP stable. Positioned with RN"s help. Pt appeared to be asleep. Asked for PT consult. We will follow her progress with them and begin seeing if pt becomes appropriate with walking.    Graylon Good, RN BSN  10/26/2013 11:26 AM

## 2013-10-26 NOTE — Progress Notes (Addendum)
ANTICOAGULATION CONSULT NOTE - Follow-Up  Pharmacy Consult for heparin Indication: atrial fibrillation  Allergies  Allergen Reactions  . Baclofen     REACTION: confusion  . Belladonna Other (See Comments)    unknown  . Doxycycline Other (See Comments)    unknown  . Iohexol      Code: RASH   . Metoclopramide     Clonic spasms  . Metronidazole     REACTION: rash  . Nitrofurantoin     REACTION: nausea  . Procaine Hcl   . Sucralfate     REACTION: nausea  . Ciprofloxacin Itching and Rash  . Erythromycin Rash and Other (See Comments)    fever  . Gabapentin Rash  . Methenamine Rash  . Nalbuphine Rash  . Penicillins Rash  . Sulfonamide Derivatives Rash    Patient Measurements: Height: 5\' 2"  (157.5 cm) Weight: 153 lb (69.4 kg) IBW/kg (Calculated) : 50.1  Vital Signs: Temp: 99.1 F (37.3 C) (06/22 1957) Temp src: Oral (06/22 1957) BP: 120/62 mmHg (06/22 1900)  Labs:  Recent Labs  10/24/13 0315 10/25/13 0230 10/25/13 0805 10/25/13 1900 10/26/13 0410 10/26/13 1904  HGB 9.3* 9.4*  --   --  8.5*  --   HCT 29.3* 29.7*  --   --  26.2*  --   PLT 268 267  --   --  238  --   HEPARINUNFRC  --   --   --  0.25* 0.17* 0.21*  CREATININE 2.09*  --  1.60*  --  1.40*  --     Estimated Creatinine Clearance: 27.8 ml/min (by C-G formula based on Cr of 1.4).  Medications:  Heparin @ 1350 units/hr  Assessment: 85 yoF on IV heparin for new onset afib.  Patient was previously on heparin in this admission for NSTEMI. Heparin level was subtherapeutic this morning and rate was increased to 1350 units/hr. a recheck this afternoon is still subtherapeutic at 0.21 units/mL. Confirmed with RN that there had been no issues with the line and drip had not been held for any reason. No bleeding noted.   Goal of Therapy:  Heparin level 0.3-0.7 units/ml Monitor platelets by anticoagulation protocol: Yes   Plan:  1. Increase IV heparin to 1450 units/hr, Recheck heparin level with AM  labs 2. Daily HL and CBC 3. Monitor for bleeding 4. F/u plans for long-term anticoagulation  Lauren D. Bajbus, PharmD, BCPS Clinical Pharmacist Pager: [phone] 10/26/2013 8:02 PM  Addum:  HL this am 0.26 but level was drawn early.  Will increase drip slightly and recheck in 6 hours. Excell Seltzer, PharmD

## 2013-10-26 NOTE — Progress Notes (Signed)
Clinical Social Work Department BRIEF PSYCHOSOCIAL ASSESSMENT 10/26/2013  Patient:  [name], [name]     Account Number:  [id number]     Admit date:  10/22/2013  Clinical Social Worker:  Freeman Caldron  Date/Time:  10/26/2013 03:13 PM  Referred by:  Physician  Date Referred:  10/26/2013 Referred for  SNF Placement   Other Referral:   Interview type:  Family Other interview type:   Spoke on the phone with pt's daughter--pt does not speak English, and pt's husband speaks broken [place]    PSYCHOSOCIAL DATA Living Status:  FACILITY Admitted from facility:  [place] Level of care:  [place] Primary support name:  Maddi Collar ([phone]) Primary support relationship to patient:  SPOUSE Degree of support available:   Good--spouse at bedside and pt has support from daughter.    CURRENT CONCERNS Current Concerns  Post-Acute Placement   Other Concerns:    SOCIAL WORK ASSESSMENT / PLAN Pt does not speak English--called pt's daughter to confirm pt is from [place]. Daughter confirmed and understands CSW role in discharge back to facility. Daughter had no questions/concerns at time of phone call. FL2 placed on chart.   Assessment/plan status:  Psychosocial Support/Ongoing Assessment of Needs Other assessment/ plan:   Information/referral to community resources:   SNF [facility]).    PATIENT'S/FAMILY'S RESPONSE TO PLAN OF CARE: Good--pt's daughter friendly over phone and understands CSW role. Thanked CSW for call.       Ky Barban, MSW, [facility] Clinical Social Worker [phone]

## 2013-10-26 NOTE — Progress Notes (Signed)
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  Returned 6/17 with syncope and chest pain. Admitted by cardiology with NSTEMI with [facility] consult.  Underwent cardiac cath and PCI with BMS 6/18. PCCM consulted 6/21 for sepsis.   SIGNIFICANT EVENTS / STUDIES:  6/18 Cardiac cath>>> severe disease in 2 vein grafts, severe native CAD with occluded RCA< LAC and circumflex - bare metal stent to mid SVG   LINES / TUBES: PIV  CULTURES: BCx2 6/21>>> Urine 6/21>>>  ANTIBIOTICS: Rocephin 6/18>>> Vanc 6/21>>> Cefepime 6/21>>>   SUBJECTIVE: No events overnight, lethargic after receiving methadone.  VITAL SIGNS: Temp:  [97.4 F (36.3 C)-99.8 F (37.7 C)] 98.9 F (37.2 C) (06/22 0724) Pulse Rate:  [70-91] 78 (06/22 0724) Resp:  [21-36] 27 (06/22 0800) BP: (81-132)/(31-58) 107/50 mmHg (06/22 0800) SpO2:  [95 %-100 %] 98 % (06/22 0800) HEMODYNAMICS:   VENTILATOR SETTINGS:   INTAKE / OUTPUT: Intake/Output     06/21 0701 - 06/22 0700 06/22 0701 - 06/23 0700   P.O. 335    I.V. (mL/kg) 2666.5 (38.4) 128.7 (1.9)   IV Piggyback 750    Total Intake(mL/kg) 3751.5 (54.1) 128.7 (1.9)   Urine (mL/kg/hr) 635 (0.4)    Stool 3 (0)    Total Output 638     Net +3113.5 +128.7         PHYSICAL EXAMINATION: General:  Chronically ill appearing elderly female, NAD  Neuro:  Awake, alert, russian speaking, MAE, does follow some commands HEENT:  Mm moist, no JVD Cardiovascular:  s1s2 rrr, now NSR Lungs:  resps even non labored on RA, mild tachypnea, diminished bases, no audible wheeze or crackle  Abdomen:   Soft, +bs Musculoskeletal:  Warm and dry, no edema   LABS:  CBC  Recent Labs Lab 10/24/13 0315 10/25/13 0230 10/26/13 0410  WBC 25.2* 33.3* 35.4*  HGB 9.3* 9.4* 8.5*  HCT 29.3* 29.7* 26.2*  PLT 268 267 238   Coag's  Recent Labs Lab 10/28/2013 0200  INR 1.10   BMET  Recent Labs Lab 10/24/13 0315 10/25/13 0805 10/26/13 0410  NA 137 140 135*  K 3.7 3.5* 3.3*  CL 95* 100 100  CO2 26 25 20   BUN 35* 41* 40*  CREATININE 2.09* 1.60* 1.40*  GLUCOSE 154* 184* 170*   Electrolytes  Recent Labs Lab 10/23/2013 0350  10/24/13 0315 10/25/13 0805 10/26/13 0410  CALCIUM  --   < > 8.6 8.7 7.9*  MG 2.2  --   --  2.3  --   < > = values in this interval not displayed. Sepsis Markers  Recent Labs Lab 11/01/2013 0438 10/24/13 1005 10/25/13 0805 10/25/13 1430 10/26/13 0410  LATICACIDVEN 1.32 2.1 1.5  --   --   PROCALCITON  --   --   --  1.68 0.65   ABG  Recent Labs Lab 10/25/13 0810  PHART 7.451*  PCO2ART 36.7  PO2ART 84.6   Liver Enzymes  Recent Labs Lab 10/26/13 0410  AST 29  ALT 17  ALKPHOS 126*  BILITOT 0.2*  ALBUMIN 1.4*   Cardiac Enzymes  Recent Labs Lab 11/02/2013 0915 10/24/13  0315  TROPONINI 6.18*  --   PROBNP  --  4016.0*   Glucose  Recent Labs Lab 10/23/13 1421  GLUCAP 179*    Imaging Dg Chest Port 1 View  10/24/2013   CLINICAL DATA:  Tachypnea, ?Fluid overload  EXAM: PORTABLE CHEST - 1 VIEW  COMPARISON:  Portable chest radiograph 10/23/2013  FINDINGS: Low lung volumes. Cardiac silhouette is enlarged. Stable median sternotomy and coronary artery bypass graft changes. Aorta is tortuous and ectatic. There is minimal blunting of the right costophrenic angle. The lungs otherwise clear. Stable calcified granuloma left mid lung. Degenerative changes within the shoulders.  IMPRESSION: Stable minimal right pleural effusion.  No acute cardiopulmonary disease.  Stable chronic changes.   Electronically Signed   By: Margaree Mackintosh M.D.   On:  10/24/2013 17:32   ASSESSMENT / PLAN:  PULMONARY Tachypnea  P:   - Pulm hygiene  - F/u CXR  - Supplemental O2 as needed   CARDIOVASCULAR Afib RVR  CAD  NSTEMI - s/p PCI 6/18 Hypotension - seems r/t RVR - much improved with rate control  Chronic dCHF SIRS/sepsis - lactate reassuring  P:  - Amiodarone gtt per cards  - Asa, plavix  - Lactate WNL - Heparin gtt per cards  - PICC line to be placed today. - Gentle hydration.  RENAL Hypokalemia  Acute on chronic renal insufficiency - baseline Scr ~0.9-1.0 P:   - Gentle volume. - F/u CBC. - Replace electrolytes as indicated.  GASTROINTESTINAL No active issue  P:   - Po diet as tol   HEMATOLOGIC Leukocytosis  Anemia - mild  P:  - F/u cbc  - Heparin gtt per cards   INFECTIOUS Presumed UTI (previous klebsiella UTI)  Sepsis  P:   - Broadened abx as above  - Pan culture   ENDOCRINE No active issue   P:   - Monitor glucose on chem   NEUROLOGIC Chronic pain  Psychosomatic do  Hx psychosis  P:   - Cont methadone per [facility]  - Avoid oversedation   I have personally obtained a history, examined the patient, evaluated laboratory and imaging results, formulated the assessment and plan and placed orders.  Rush Farmer, M.D. [facility] Pulmonary/Critical Care Medicine. Pager: [phone]. After hours pager: [phone].

## 2013-10-26 NOTE — Progress Notes (Signed)
Follow up note  Labs were followed up, patient coming back positive for C diff. Starting Dificid, will stop IV vancomycin and IV cefepime for now and await urine and blood cultures.

## 2013-10-26 NOTE — Progress Notes (Signed)
PT Cancellation Note  Patient Details Name: [name] MRN: [id number] DOB: 01/18/1928   Cancelled Treatment:    Reason Eval/Treat Not Completed: Medical issues which prohibited therapy. BP 81/39, and per cardiac rehab note pt with difficulty following commands and decreased responsiveness during session. Will attempt evaluation tomorrow.   Jolyn Lent 10/26/2013, 1:05 PM  Jolyn Lent, PT, DPT Acute Rehabilitation Services Pager: [phone]

## 2013-10-26 NOTE — Progress Notes (Signed)
     SUBJECTIVE: Pt has no complaints. No interpretor is present.   BP 107/50  Pulse 78  Temp(Src) 98.9 F (37.2 C) (Oral)  Resp 27  Ht 5\' 2"  (1.575 m)  Wt 153 lb (69.4 kg)  BMI 27.98 kg/m2  SpO2 98%  Intake/Output Summary (Last 24 hours) at 10/26/13 3710 Last data filed at 10/26/13 0800  Gross per 24 hour  Intake 3330.24 ml  Output    638 ml  Net 2692.24 ml   PHYSICAL EXAM General: Well developed, well nourished, in no acute distress. Alert Psych:  Good affect, responds appropriately Neck: No JVD. No masses noted.  Lungs: Clear bilaterally with no wheezes or rhonci noted.  Heart: RRR with no murmurs noted. Abdomen: Bowel sounds are present. Soft, non-tender.  Extremities: No lower extremity edema.   LABS: Basic Metabolic Panel:  Recent Labs  10/25/13 0805 10/26/13 0410  NA 140 135*  K 3.5* 3.3*  CL 100 100  CO2 25 20  GLUCOSE 184* 170*  BUN 41* 40*  CREATININE 1.60* 1.40*  CALCIUM 8.7 7.9*  MG 2.3  --    CBC:  Recent Labs  10/25/13 0230 10/26/13 0410  WBC 33.3* 35.4*  HGB 9.4* 8.5*  HCT 29.7* 26.2*  MCV 96.1 95.3  PLT 267 238   Current Meds: . aspirin EC  81 mg Oral Daily  . ceFEPime (MAXIPIME) IV  500 mg Intravenous Q24H  . clopidogrel  75 mg Oral Q breakfast  . conjugated estrogens  1 g Vaginal Once per day on Mon Thu  . divalproex  500 mg Oral TID  . docusate sodium  100 mg Oral BID  . FLUoxetine  10 mg Oral Daily  . pantoprazole  40 mg Oral BID AC  . sodium chloride  3 mL Intravenous Q12H  . vancomycin  1,000 mg Intravenous Q48H    ASSESSMENT AND PLAN:  1. CAD/NSTEMI/Unstable angina: She is s/p CABG and most recent cath October 22, 2013 with BMS of SVG to diagonal and PTCA with cutting balloon angioplasty of SVG to PDA.  Continue ASA and Plavix. No recurrent angina.   2. Atrial fibrillation with RVR: She has been on IV amiodarone. Now in sinus. Will change to amiodarone 400 mg po BID. Continue heparin drip. Will need to discuss long term  anti-coagulation as her clinical picture changes.   3. Severe sepsis/UTI: Per primary team.   4. Acute renal failure: Post cath. Renal function is stable.   5. Hypokalemia: Replace potassium this am.     MCALHANY,CHRISTOPHER  6/22/20159:52 AM

## 2013-10-26 NOTE — Progress Notes (Signed)
[facility] PROGRESS NOTE  [name] EXH:[id number] DOB: 1927/09/16 DOA: 10/27/2013 PCP: Walker Kehr, MD  Assessment/Plan: 1.  Severe Sepsis -Evidenced by respiratory rate in the 30's, rising white count now at 33.3, Hypotension (SBP's in the 80's) acute kidney injury, encephalopathy, developing during this hospitalization, with infectious source likely urinary tract. Foley cath placed earlier in this hospitalization for urinary retention.  -Her antibiotic regimen was expanded yesterday, now on Vancomycin and Cefepime.  -Clinically improved, afebrile, was assisted out of bed to chair.   2. Acute Kidney Injury -Patient is septic though I suspect resulted from contrast induced nephropathy from cath,  ATN from hypotension and UTI/pyelonephritis are probable contributors.  -Kidney function improved on this morning's labs, with Creatinine trending down to 1.4 -Continue IV fluids  3.  UTI/Urosepsis -Has septic criteria. CXR negative, no other obvious source of infection. Expended antibiotic coverage, now on IV Cefepime and Vancomycin -White count continues to increase -Previous urine culture positive for Klebsiella, with sensitivity to Rocephin -Blood cultures obtained, awaiting urine culture results  4. Afib with RVR -Patient went into Afib w RVR yesterday, evaluated by cardiology started on an Amiodarone gtt.  -Initially hypotensive, however this rimproved with better control of HR's.  -Tele reviewed currently appears to be in SR  5.  Chronic Diastolic CHF -Compensated, will monitor her fluid status as she is receiving IV fluids for severe sepsis -Cardiology following  6. Coronary Artery Disease -Cardiac catheterization on 10/10/2013, undergoing bare metal stenting to mid SVG-diagonal and PTCA to SVG-RPDA. -Cardiolgy Following  7.  Deconditioning -PT consulted  Code Status: Full Family Communication: Spoke with her husband at bedside   Antibiotics:  Rocephin  discontinued 6/21  Vanco started 6/21  Cefepime started 6/21  HPI/Subjective: Patient was helped out of bed to chair, seems improved. She is tolerating her breakfast.    Objective: Filed Vitals:   10/26/13 0800  BP: 107/50  Pulse:   Temp:   Resp: 27    Intake/Output Summary (Last 24 hours) at 10/26/13 0834 Last data filed at 10/26/13 0800  Gross per 24 hour  Intake 3880.24 ml  Output    638 ml  Net 3242.24 ml   Filed Weights   10/27/2013 0700 10/14/2013 0332 10/25/13 0321  Weight: 66.5 kg (146 lb 9.7 oz) 66.4 kg (146 lb 6.2 oz) 69.4 kg (153 lb)    Exam:   General:  Improved, sitting comfortably at bedside chair, husband at bedside, able to answer questions appropriately  Cardiovascular: Regular rate and rhythm, normal S1S2  Respiratory: Has bibasilar crackles, tachypneic, no wheezes rales or rhonchi  Abdomen: Soft nontender nondistended  Musculoskeletal: No edema noted  Data Reviewed: Basic Metabolic Panel:  Recent Labs Lab 11/01/2013 0350  10/23/13 0426 10/23/13 1600 10/24/13 0315 10/25/13 0805 10/26/13 0410  NA  --   < > 135* 134* 137 140 135*  K  --   < > 3.4* 5.8* 3.7 3.5* 3.3*  CL  --   < > 94* 97 95* 100 100  CO2  --   < > 27 11* 26 25 20   GLUCOSE  --   < > 134* 255* 154* 184* 170*  BUN  --   < > 19 19 35* 41* 40*  CREATININE  --   < > 1.21* 1.80* 2.09* 1.60* 1.40*  CALCIUM  --   < > 9.0 8.2* 8.6 8.7 7.9*  MG 2.2  --   --   --   --  2.3  --   < > =  values in this interval not displayed. Liver Function Tests:  Recent Labs Lab 10/26/13 0410  AST 29  ALT 17  ALKPHOS 126*  BILITOT 0.2*  PROT 4.9*  ALBUMIN 1.4*   No results found for this basename: LIPASE, AMYLASE,  in the last 168 hours No results found for this basename: AMMONIA,  in the last 168 hours CBC:  Recent Labs Lab 10/19/2013 0345  10/23/2013 1210 10/23/13 0426 10/24/13 0315 10/25/13 0230 10/26/13 0410  WBC 12.2*  < > 12.7* 18.7* 25.2* 33.3* 35.4*  NEUTROABS 9.0*  --   --   --    --   --   --   HGB 10.2*  < > 10.3* 10.0* 9.3* 9.4* 8.5*  HCT 32.0*  < > 30.7* 30.6* 29.3* 29.7* 26.2*  MCV 94.7  < > 93.0 93.0 95.1 96.1 95.3  PLT 213  < > 240 279 268 267 238  < > = values in this interval not displayed. Cardiac Enzymes:  Recent Labs Lab 10/10/2013 0915  TROPONINI 6.18*   BNP (last 3 results)  Recent Labs  10/24/13 0315  PROBNP 4016.0*   CBG:  Recent Labs Lab 10/23/13 1421  GLUCAP 179*    Recent Results (from the past 240 hour(s))  MRSA PCR SCREENING     Status: None   Collection Time    10/18/2013  7:25 AM      Result Value Ref Range Status   MRSA by PCR NEGATIVE  NEGATIVE Final   Comment:            The GeneXpert MRSA Assay (FDA     approved for NASAL specimens     only), is one component of a     comprehensive MRSA colonization     surveillance program. It is not     intended to diagnose MRSA     infection nor to guide or     monitor treatment for     MRSA infections.  CULTURE, BLOOD (ROUTINE X 2)     Status: None   Collection Time    10/25/13  8:05 AM      Result Value Ref Range Status   Specimen Description BLOOD LEFT ARM   Final   Special Requests BOTTLES DRAWN AEROBIC AND ANAEROBIC 10CC   Final   Culture  Setup Time     Final   Value: 10/25/2013 15:00     Performed at [facility]   Culture     Final   Value:        BLOOD CULTURE RECEIVED NO GROWTH TO DATE CULTURE WILL BE HELD FOR 5 DAYS BEFORE ISSUING A FINAL NEGATIVE REPORT     Performed at [facility]   Report Status PENDING   Incomplete  CULTURE, BLOOD (ROUTINE X 2)     Status: None   Collection Time    10/25/13  8:09 AM      Result Value Ref Range Status   Specimen Description BLOOD RIGHT ARM   Final   Special Requests BOTTLES DRAWN AEROBIC AND ANAEROBIC 10CC   Final   Culture  Setup Time     Final   Value: 10/25/2013 15:01     Performed at [facility]   Culture     Final   Value:        BLOOD CULTURE RECEIVED NO GROWTH TO DATE CULTURE WILL BE  HELD FOR 5 DAYS BEFORE ISSUING A FINAL NEGATIVE REPORT     Performed at  [facility]   Report Status PENDING   Incomplete     Studies: Dg Chest Port 1 View  10/24/2013   CLINICAL DATA:  Tachypnea, ?Fluid overload  EXAM: PORTABLE CHEST - 1 VIEW  COMPARISON:  Portable chest radiograph 10/23/2013  FINDINGS: Low lung volumes. Cardiac silhouette is enlarged. Stable median sternotomy and coronary artery bypass graft changes. Aorta is tortuous and ectatic. There is minimal blunting of the right costophrenic angle. The lungs otherwise clear. Stable calcified granuloma left mid lung. Degenerative changes within the shoulders.  IMPRESSION: Stable minimal right pleural effusion.  No acute cardiopulmonary disease.  Stable chronic changes.   Electronically Signed   By: Margaree Mackintosh M.D.   On: 10/24/2013 17:32    Scheduled Meds: . aspirin EC  81 mg Oral Daily  . ceFEPime (MAXIPIME) IV  500 mg Intravenous Q24H  . clopidogrel  75 mg Oral Q breakfast  . conjugated estrogens  1 g Vaginal Once per day on Mon Thu  . divalproex  500 mg Oral TID  . docusate sodium  100 mg Oral BID  . FLUoxetine  10 mg Oral Daily  . pantoprazole  40 mg Oral BID AC  . sodium chloride  3 mL Intravenous Q12H  . vancomycin  1,000 mg Intravenous Q48H   Continuous Infusions: . amiodarone 30 mg/hr (10/26/13 0700)  . heparin 1,200 Units/hr (10/26/13 0700)    Principal Problem:   NSTEMI (non-ST elevated myocardial infarction) Active Problems:   Syncope   Hx of CABG   Septic shock    Time spent: 39 min    Kelvin Cellar  [facility] Pager [phone]. If 7PM-7AM, please contact night-coverage at [email or web address], password [facility] 10/26/2013, 8:34 AM  LOS: 5 days

## 2013-10-27 ENCOUNTER — Encounter (HOSPITAL_COMMUNITY): Payer: Self-pay

## 2013-10-27 ENCOUNTER — Inpatient Hospital Stay (HOSPITAL_COMMUNITY): Payer: Medicare Other

## 2013-10-27 ENCOUNTER — Other Ambulatory Visit: Payer: Self-pay | Admitting: Internal Medicine

## 2013-10-27 DIAGNOSIS — N3000 Acute cystitis without hematuria: Secondary | ICD-10-CM

## 2013-10-27 DIAGNOSIS — I200 Unstable angina: Secondary | ICD-10-CM

## 2013-10-27 DIAGNOSIS — A0472 Enterocolitis due to Clostridium difficile, not specified as recurrent: Secondary | ICD-10-CM

## 2013-10-27 LAB — BASIC METABOLIC PANEL
BUN: 46 mg/dL — AB (ref 6–23)
CO2: 20 mEq/L (ref 19–32)
CREATININE: 1.43 mg/dL — AB (ref 0.50–1.10)
Calcium: 8.4 mg/dL (ref 8.4–10.5)
Chloride: 98 mEq/L (ref 96–112)
GFR calc non Af Amer: 33 mL/min — ABNORMAL LOW (ref >90)
GFR, EST AFRICAN AMERICAN: 38 mL/min — AB (ref >90)
Glucose, Bld: 169 mg/dL — ABNORMAL HIGH (ref 70–99)
Potassium: 4.1 mEq/L (ref 3.7–5.3)
Sodium: 135 mEq/L — ABNORMAL LOW (ref 137–147)

## 2013-10-27 LAB — HEPARIN LEVEL (UNFRACTIONATED)
HEPARIN UNFRACTIONATED: 0.34 [IU]/mL (ref 0.30–0.70)
Heparin Unfractionated: 0.26 IU/mL — ABNORMAL LOW (ref 0.30–0.70)
Heparin Unfractionated: 0.35 IU/mL (ref 0.30–0.70)

## 2013-10-27 LAB — DIFFERENTIAL
Basophils Absolute: 0 10*3/uL (ref 0.0–0.1)
Basophils Relative: 0 % (ref 0–1)
Eosinophils Absolute: 0.4 10*3/uL (ref 0.0–0.7)
Eosinophils Relative: 1 % (ref 0–5)
LYMPHS ABS: 0.7 10*3/uL (ref 0.7–4.0)
LYMPHS PCT: 1 % — AB (ref 12–46)
Monocytes Absolute: 4.1 10*3/uL — ABNORMAL HIGH (ref 0.1–1.0)
Monocytes Relative: 8 % (ref 3–12)
Neutro Abs: 44.9 10*3/uL — ABNORMAL HIGH (ref 1.7–7.7)
Neutrophils Relative %: 90 % — ABNORMAL HIGH (ref 43–77)
WBC MORPHOLOGY: INCREASED

## 2013-10-27 LAB — PROCALCITONIN: Procalcitonin: 0.96 ng/mL

## 2013-10-27 LAB — CBC
HCT: 29 % — ABNORMAL LOW (ref 36.0–46.0)
Hemoglobin: 9.4 g/dL — ABNORMAL LOW (ref 12.0–15.0)
MCH: 30.5 pg (ref 26.0–34.0)
MCHC: 32.4 g/dL (ref 30.0–36.0)
MCV: 94.2 fL (ref 78.0–100.0)
PLATELETS: 339 10*3/uL (ref 150–400)
RBC: 3.08 MIL/uL — ABNORMAL LOW (ref 3.87–5.11)
RDW: 14.3 % (ref 11.5–15.5)
WBC: 53.3 10*3/uL (ref 4.0–10.5)

## 2013-10-27 LAB — URINE CULTURE
Colony Count: NO GROWTH
Culture: NO GROWTH

## 2013-10-27 LAB — LACTIC ACID, PLASMA: Lactic Acid, Venous: 2.7 mmol/L — ABNORMAL HIGH (ref 0.5–2.2)

## 2013-10-27 MED ORDER — PANTOPRAZOLE SODIUM 40 MG PO PACK
40.0000 mg | PACK | Freq: Every day | ORAL | Status: DC
  Filled 2013-10-27: qty 20

## 2013-10-27 MED ORDER — VALPROIC ACID 250 MG/5ML PO SYRP
500.0000 mg | ORAL_SOLUTION | Freq: Three times a day (TID) | ORAL | Status: DC
  Administered 2013-10-27: 500 mg via ORAL
  Filled 2013-10-27 (×4): qty 10

## 2013-10-27 MED ORDER — FLUOXETINE HCL 20 MG/5ML PO SOLN
10.0000 mg | Freq: Every day | ORAL | Status: DC
  Filled 2013-10-27: qty 5

## 2013-10-27 MED ORDER — DOCUSATE SODIUM 50 MG/5ML PO LIQD
100.0000 mg | Freq: Two times a day (BID) | ORAL | Status: DC
  Administered 2013-10-27: 100 mg via ORAL
  Filled 2013-10-27 (×3): qty 10

## 2013-10-27 MED ORDER — SODIUM CHLORIDE 0.9 % IV BOLUS (SEPSIS)
500.0000 mL | Freq: Once | INTRAVENOUS | Status: AC
  Administered 2013-10-27: 500 mL via INTRAVENOUS

## 2013-10-27 MED ORDER — SODIUM CHLORIDE 0.9 % IV SOLN: INTRAVENOUS | Status: DC

## 2013-10-27 MED ORDER — ASPIRIN 81 MG PO CHEW
81.0000 mg | CHEWABLE_TABLET | Freq: Every day | ORAL | Status: DC
  Filled 2013-10-27: qty 1

## 2013-10-27 NOTE — Progress Notes (Signed)
ANTICOAGULATION CONSULT NOTE - Follow-Up  Pharmacy Consult for heparin Indication: atrial fibrillation  Allergies  Allergen Reactions  . Baclofen     REACTION: confusion  . Belladonna Other (See Comments)    unknown  . Doxycycline Other (See Comments)    unknown  . Iohexol      Code: RASH   . Metoclopramide     Clonic spasms  . Metronidazole     REACTION: rash  . Nitrofurantoin     REACTION: nausea  . Procaine Hcl   . Sucralfate     REACTION: nausea  . Ciprofloxacin Itching and Rash  . Erythromycin Rash and Other (See Comments)    fever  . Gabapentin Rash  . Methenamine Rash  . Nalbuphine Rash  . Penicillins Rash  . Sulfonamide Derivatives Rash    Patient Measurements: Height: 5\' 2"  (157.5 cm) Weight: 153 lb (69.4 kg) IBW/kg (Calculated) : 50.1  Vital Signs: Temp: 97.6 F (36.4 C) (06/23 1134) Temp src: Oral (06/23 1134) BP: 93/40 mmHg (06/23 1134)  Labs:  Recent Labs  10/25/13 0230 10/25/13 0805  10/26/13 0410 10/26/13 1904 10/27/13 0250 10/27/13 1105  HGB 9.4*  --   --  8.5*  --  9.4*  --   HCT 29.7*  --   --  26.2*  --  29.0*  --   PLT 267  --   --  238  --  339  --   HEPARINUNFRC  --   --   < > 0.17* 0.21* 0.26* 0.35  CREATININE  --  1.60*  --  1.40*  --  1.43*  --   < > = values in this interval not displayed.  Estimated Creatinine Clearance: 27.2 ml/min (by C-G formula based on Cr of 1.43).  Medications:  Heparin @ 1500 units/hr  Assessment: 85 yoF on IV heparin for new onset afib.  Patient was previously on heparin in this admission for NSTEMI. Heparin level was therapeutic this morning but on lower end of goal range. No bleeding noted.   Goal of Therapy:  Heparin level 0.3-0.7 units/ml Monitor platelets by anticoagulation protocol: Yes   Plan:  1. Increase IV heparin to 1550 units/hr, Recheck 8-hr heparin level 2. Daily HL and CBC 3. Monitor for bleeding 4. F/u plans for long-term anticoagulation  Albertina Parr, PharmD.   Clinical Pharmacist Pager [phone]

## 2013-10-27 NOTE — Evaluation (Signed)
Clinical/Bedside Swallow Evaluation Patient Details  Name: [name] MRN: [id number] Date of Birth: 11-02-1927  Today's Date: 10/27/2013 Time: 1530-1600 SLP Time Calculation (min): 30 min  Past Medical History:  Past Medical History  Diagnosis Date  . CAD (coronary artery disease)     Catheterization, January, 2007 patent grafts / hospitalization October 2 010 no MI  . Syncope 03/2009    ?orthostasis  . Allergic rhinitis   . History of colon polyps   . Diverticulosis of colon   . GERD (gastroesophageal reflux disease)   . Hyperlipidemia   . Abdominal pain, other specified site     motility disorder, chorinic functional, severe (Dr Olevia Perches). Possible OCD w/complusive use of enemas and laxatives; psychotic features 2011  . Positive H. pylori test 2911  . Psychosomatic disorder     w/GI fixation  . Anxiety   . Depression   . Stress incontinence, female   . Vitamin D deficiency   . Vitamin B12 deficiency   . Renal insufficiency   . UTI (urinary tract infection)   . Hypokalemia     mild  . Insomnia   . Carotid artery disease     Doppler November, 0347, 4-25% LICA, 95-63% R. ICA.Marland Kitchen increased velocity... possibly from serpentine vessel  . Ejection fraction     50-55%, no wall motion abnormalities, echo, November, 2010  . Edema   . Hemothorax on right   . Breast cancer 2011    Dr Humphrey Rolls- right breast  . Stroke     a. Prior infarct seen on head CT 10/2013.   Past Surgical History:  Past Surgical History  Procedure Laterality Date  . Coronary artery bypass graft    . Myomectomy      Uterine and ovarian  . Breast fibroadenoma surgery    . Hemicolectomy  2005    Left  . Breast lumpectomy  2011  . 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 Returned 6/17 with  syncope and chest pain. Admitted by cardiology with NSTEMI with [facility] consult. Underwent cardiac cath and PCI with BMS 6/18. RN states pt has had difficutly swallowing pills.    Assessment / Plan / Recommendation Clinical Impression  Pt signficantly lethargic, though she does respond to verbal, visual and contextual cues to increase awareness during PO intake. Pt demonstrates immediate cough when given cup sips of liquids with reduced awareness of bolus arrival; pts function with self intiaited straw sip improved, though questionable wet vocal quality noted post assessment. Suspect at least a delayed swallow and possible residuals or penetration of liquids. Recommend pt initate a puree (dys 1) diet with thin liquids. SLP will f/u for tolerance and need for further assessment.     Aspiration Risk  Moderate    Diet Recommendation Dysphagia 1 (Puree);Thin liquid   Liquid Administration via: Spoon;Straw Medication Administration: Whole meds with puree Supervision: Full supervision/cueing for compensatory strategies Compensations: Slow rate;Small sips/bites;Check for pocketing;Clear throat intermittently Postural Changes and/or Swallow Maneuvers: Seated upright 90 degrees    Other  Recommendations Oral Care Recommendations: Oral care BID   Follow Up Recommendations  24 hour supervision/assistance    Frequency and Duration min 2x/week  2 weeks   Pertinent Vitals/Pain NA    SLP Swallow Goals     Swallow Study Prior Functional Status  General HPI: 80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 Returned 6/17 with syncope and chest pain. Admitted by cardiology with NSTEMI with [facility] consult. Underwent cardiac cath and PCI with BMS 6/18. RN states pt has had difficutly swallowing pills.  Type of Study: Bedside swallow  evaluation Previous Swallow Assessment: none in chart Diet Prior to this Study: NPO Temperature Spikes Noted: No Respiratory Status: Room air Trach Size and Type: Cuff History of Recent Intubation: No Behavior/Cognition: Lethargic Oral Cavity - Dentition: Edentulous Self-Feeding Abilities: Total assist Patient Positioning: Upright in chair Baseline Vocal Quality: Wet Volitional Cough: Weak Volitional Swallow: Unable to elicit    Oral/Motor/Sensory Function Overall Oral Motor/Sensory Function: Other (comment) (doesnt follow commands)   Ice Chips     Thin Liquid Thin Liquid: Impaired Presentation: Cup;Straw Oral Phase Impairments: Poor awareness of bolus Oral Phase Functional Implications: Prolonged oral transit Pharyngeal  Phase Impairments: Suspected delayed Swallow;Wet Vocal Quality;Cough - Immediate    Nectar Thick Nectar Thick Liquid: Not tested   Honey Thick Honey Thick Liquid: Not tested   Puree Puree: Impaired Presentation: Spoon Oral Phase Functional Implications: Prolonged oral transit Pharyngeal Phase Impairments: Wet Vocal Quality;Suspected delayed Swallow   Solid   GO    Solid: Not tested      Herbie Baltimore, MA CCC-SLP [phone]  Lynann Beaver 10/27/2013,4:03 PM

## 2013-10-27 NOTE — Progress Notes (Signed)
[facility] PROGRESS NOTE  [name] NOB:[id number] DOB: 1928/04/17 DOA: 10/15/2013 PCP: Walker Kehr, MD  Interim Summary  Patient is a pleasant 80 year old female with a past medical history of coronary artery disease, history of breast cancer, chronic pain syndrome on methadone, who was recently discharged from the medicine service on 10/10/2013 at which time she was treated for acute encephalopathy likely related to urinary tract infection. She was discharged on Keflex and clindamycin. On this presentation she was admitted to the cardiology service on 10/07/2013, presenting with complaints of chest pain, labs showing elevated troponin of 6.7. Patient having history of coronary artery disease status post four-vessel coronary artery bypass grafting. She was taken to the Cath Lab on 10/14/2013 undergoing bare metal stenting to mid SVG-diagonal and PTCA to SVG-RPDA. Post cath she became agitated and on the following day was obtunded. Cardiology requested a medicine consult for evaluation of mental status changes. Labs were reviewed, patient had a white count of 18.7 for which chest x-ray and UA were ordered. She was found to have urinary tract infection. It was felt that acute encephalopathy likely was multifactorial with underlying infectious process, multiple psychotropic medications, dehydration and acute renal failure all probable contributors. She was initially treated with ceftriaxone 1 g IV every 24 hours, started on 10/23/2013. Her medication regimen was simplified with the discontinuation of Risperdal, Ativan, Ambien, and her methadone dose being brought down to 5 mg twice a day when necessary. Patient did not show improvement despite initiating Rocephin. Her white count continued to trend up, meaning sepsis criteria I expanded her IV antibiotic therapy with IV vancomycin and cefepime.  Her creatinine peaked at 2.09 by 10/24/2013 for which she was given IV fluids. Pulmonary critical  care medicine was consulted given overall clinical deterioration. On 10/26/2013 her white count had increased to 35,400. Other sources of infection were considered for which a stool for C. difficile was checked. She was found to be C. difficile positive on 10/26/2013. On this date she was started on Dicifid therapy. I held vancomycin and cefepime on 10/26/2013 awaiting results of urine culture.  Her hospitalization was also complicated by the development of atrial fibrillation with rapid ventricular response. Cardiology place her on an amiodarone drip and transition to her to oral amiodarone yesterday.  Assessment/Plan: 1.  Severe Sepsis -Evidenced by respiratory rate in the 30's, rising white count now at 33.3, Hypotension (SBP's in the 80's) acute kidney injury, encephalopathy, developing during this hospitalization, with infectious source C diff colitis/UTI. Foley cath placed earlier in this hospitalization for urinary retention, as her urine was consistent with UTI.  -Her antibiotic regimen was expanded on 10/25/13 to IV Vanc and Cefepime.  -Stool coming back positive for C diff colitis, as she was started on dificid. Vanc and Cefepime held.  -White count continues to trend up to 53,000.  -Will obtain CT scan of Abd/pelvis and check a lactate level -Dr Baxter Flattery of ID consulted  2.  Severe C diff Collitis -Patient with history of recurrent urinary tract infections, haas had multiple courses of antibiotics.  -Was placed on Rocephin on 10/23/2013 for UTI during this hospitalization, then changed on IV Vanc and Cefepime. Broad spectrum AB's stopped on 10/26/2013. Urine cultures negative and Cdiff positive.  -Stool positive for Cdiff, started on  Dificid.  -Given upward trend in [facility] now to 53,000 will check a CT of abdomen and pelvis.  -ID consulted  4. Acute Kidney Injury -Patient is septic though I suspect resulted from contrast induced nephropathy from cath,  ATN from hypotension and UTI/pyelonephritis are probable contributors.  -Kidney function stable, having creatinine of 1.4 -Continue IV fluids  5.  UTI/Urosepsis -Was started on Rocephin on 10/23/2013, with antibiotics expanded on 10/25/2013, stopped on 10/26/2013 -Urine cultures showing no growth - Case discussed with Dr Baxter Flattery, agreed with the discontinuation of Cefepime and Vanc  6. Afib with RVR -Patient went into Afib w RVR yesterday, evaluated by cardiology started on an Amiodarone gtt.  -Initially hypotensive, however this rimproved with better control of HR's.  -Tele reviewed currently appears to be in SR  7.  Chronic Diastolic CHF -Compensated, will monitor her fluid status as she is receiving IV fluids for severe sepsis -Cardiology following  8. Coronary Artery Disease -Cardiac catheterization on 10/10/2013, undergoing bare metal stenting to mid SVG-diagonal and PTCA to SVG-RPDA. -Cardiolgy Following  9.  Deconditioning -PT consulted  Code Status: Full Family Communication: Spoke with her husband at bedside   Antibiotics:  Rocephin discontinued 6/21  Vanco started 6/21 stopped on 10/26/2013  Cefepime started 6/21 stopped on 10/26/2013  HPI/Subjective: She is awake and alert complaining of abdominal pain.     Objective: Filed Vitals:   10/27/13 0600  BP: 103/36  Pulse:   Temp:   Resp: 29    Intake/Output Summary (Last 24 hours) at 10/27/13 0747 Last data filed at 10/27/13 0600  Gross per 24 hour  Intake 1766.35 ml  Output    535 ml  Net 1231.35 ml   Filed Weights   10/10/2013 0700 10/24/2013 0332 10/25/13 0321  Weight: 66.5 kg (146 lb 9.7 oz) 66.4 kg (146 lb 6.2 oz) 69.4 kg (153 lb)    Exam:   General:  Awake and alert, ill  appearing, toxic, does follow commands  Cardiovascular: Irregular rate and rhythm, normal S1S2  Respiratory: Has bibasilar crackles, tachypneic, no wheezes rales or rhonchi  Abdomen: Soft, does not appear to have peritoneal signs, no rebound tenderness or guarding   Musculoskeletal: No edema noted  Data Reviewed: Basic Metabolic Panel:  Recent Labs Lab 10/18/2013 0350  10/23/13 1600 10/24/13 0315 10/25/13 0805 10/26/13 0410 10/27/13 0250  NA  --   < > 134* 137 140 135* 135*  K  --   < > 5.8* 3.7 3.5* 3.3* 4.1  CL  --   < > 97 95* 100 100 98  CO2  --   < > 11* 26 25 20 20   GLUCOSE  --   < > 255* 154* 184* 170* 169*  BUN  --   < > 19 35* 41* 40* 46*  CREATININE  --   < > 1.80* 2.09* 1.60* 1.40* 1.43*  CALCIUM  --   < > 8.2* 8.6 8.7 7.9* 8.4  MG 2.2  --   --   --  2.3  --   --   < > = values in this interval not displayed. Liver Function Tests:  Recent Labs Lab 10/26/13 0410  AST 29  ALT 17  ALKPHOS 126*  BILITOT 0.2*  PROT 4.9*  ALBUMIN 1.4*   No results found for this basename: LIPASE, AMYLASE,  in the last 168 hours No results found for this basename: AMMONIA,  in the last 168 hours CBC:  Recent Labs Lab 10/06/2013 0345  10/23/13 0426 10/24/13 0315 10/25/13 0230 10/26/13 0410 10/27/13 0250  WBC 12.2*  < > 18.7* 25.2* 33.3* 35.4* 53.3*  NEUTROABS 9.0*  --   --   --   --   --   --   HGB 10.2*  < > 10.0* 9.3* 9.4* 8.5* 9.4*  HCT 32.0*  < > 30.6* 29.3* 29.7* 26.2* 29.0*  MCV 94.7  < > 93.0 95.1 96.1 95.3 94.2  PLT 213  < > 279 268 267 238 339  < > = values in this interval not displayed. Cardiac Enzymes:  Recent Labs Lab 10/30/2013 0915  TROPONINI 6.18*   BNP (last 3 results)  Recent Labs  10/24/13 0315  PROBNP 4016.0*   CBG:  Recent Labs Lab 10/23/13 1421  GLUCAP 179*    Recent Results (from the past 240 hour(s))  MRSA PCR SCREENING     Status: None   Collection Time    10/30/2013  7:25 AM      Result Value Ref Range Status   MRSA by PCR  NEGATIVE  NEGATIVE Final   Comment:            The GeneXpert MRSA Assay (FDA     approved for NASAL specimens     only), is one component of a     comprehensive MRSA colonization     surveillance program. It is not     intended to diagnose MRSA     infection nor to guide or     monitor treatment for     MRSA infections.  CULTURE, BLOOD (ROUTINE X 2)     Status: None   Collection Time    10/25/13  8:05 AM      Result Value Ref Range Status   Specimen Description BLOOD LEFT ARM   Final   Special Requests BOTTLES DRAWN AEROBIC AND ANAEROBIC 10CC   Final   Culture  Setup Time     Final   Value: 10/25/2013 15:00     Performed at [facility]   Culture     Final   Value:        BLOOD CULTURE RECEIVED NO GROWTH TO DATE CULTURE WILL BE HELD FOR 5 DAYS BEFORE ISSUING A FINAL NEGATIVE REPORT     Performed at [facility]   Report Status PENDING   Incomplete  CULTURE, BLOOD (ROUTINE X 2)     Status: None   Collection Time    10/25/13  8:09 AM  Result Value Ref Range Status   Specimen Description BLOOD RIGHT ARM   Final   Special Requests BOTTLES DRAWN AEROBIC AND ANAEROBIC 10CC   Final   Culture  Setup Time     Final   Value: 10/25/2013 15:01     Performed at [facility]   Culture     Final   Value:        BLOOD CULTURE RECEIVED NO GROWTH TO DATE CULTURE WILL BE HELD FOR 5 DAYS BEFORE ISSUING A FINAL NEGATIVE REPORT     Performed at [facility]   Report Status PENDING   Incomplete  URINE CULTURE     Status: None   Collection Time    10/25/13  3:32 PM      Result Value Ref Range Status   Specimen Description URINE, CATHETERIZED   Final   Special Requests NONE   Final   Culture  Setup Time     Final   Value: 10/25/2013 22:51     Performed at [place]     Final   Value: NO GROWTH     Performed at [facility]   Culture     Final   Value: NO GROWTH     Performed at [facility]   Report Status  10/27/2013 FINAL   Final  CLOSTRIDIUM DIFFICILE BY PCR     Status: Abnormal   Collection Time    10/26/13  9:17 AM      Result Value Ref Range Status   C difficile by pcr POSITIVE (*) NEGATIVE Final   Comment: CRITICAL RESULT CALLED TO, READ BACK BY AND VERIFIED WITH:     Christain Sacramento RN 11:10 10/26/13 ([email or web address])     Studies: US Renal  10/26/2013   CLINICAL DATA:  Urosepsis, renal insufficiency  EXAM: RENAL/URINARY TRACT ULTRASOUND COMPLETE  COMPARISON:  Ultrasound 07/22/2010  FINDINGS: Right Kidney:  Length: 9.8 cm. Echogenicity within normal limits. No mass or hydronephrosis visualized.  Left Kidney:  Length: 10.2 cm. Echogenicity within normal limits. No mass or hydronephrosis visualized.  Bladder:  Bladder collapsed from Foley catheter.  IMPRESSION: Normal kidneys.  No hydronephrosis.  No obstruction.   Electronically Signed   By: Suzy Bouchard M.D.   On: 10/26/2013 16:38    Scheduled Meds: . amiodarone  400 mg Oral BID  . aspirin EC  81 mg Oral Daily  . clopidogrel  75 mg Oral Q breakfast  . conjugated estrogens  1 g Vaginal Once per day on Mon Thu  . divalproex  500 mg Oral TID  . docusate sodium  100 mg Oral BID  . fidaxomicin  200 mg Oral BID  . FLUoxetine  10 mg Oral Daily  . pantoprazole  40 mg Oral BID AC  . sodium chloride  3 mL Intravenous Q12H   Continuous Infusions: . heparin 1,500 Units/hr (10/27/13 0430)    Principal Problem:   NSTEMI (non-ST elevated myocardial infarction) Active Problems:   Syncope   Hx of CABG   Septic shock    Time spent: 22 min    ZAMORA, [facility] Pager [phone]. If 7PM-7AM, please contact night-coverage at [email or web address], password [facility] 10/27/2013, 7:47 AM  LOS: 6 days

## 2013-10-27 NOTE — Progress Notes (Signed)
RN called pharmacy to have PM medications switched to liquid and to verify which medications could be crushed. Pt had speech evaluation on 6/23 that recommended thin liquids using straw or spoon. RN repositioned pt in high fowlers and called her name. Pt opened eyes to name. Pt unable to sip medication through straw. RN used spoon to feed medication to pt. Pt swallowed as spoon placed in mouth. Pt able to finish medications. RN gave pt sip of water via spoon to clear mouth of remaining medications. Pt immediately began to cough. RN encouraged pt to keep coughing. Due to language barrier pt not easily following commands. Auditory wheezing assessed, RR now high 40's HR 120-130 A-fib from NRS, BP 94/57 O2 sats 96 [place] 2L. E-link called. E-link RN notified. This RN waiting for MD evaluation. Will continue to monitor and assess pt closely.

## 2013-10-27 NOTE — Evaluation (Signed)
Physical Therapy Evaluation Patient Details Name: [name] MRN: [id number] DOB: 06/03/1927 Today's Date: 10/27/2013   History of Present Illness  Pt is an 80 y/o female admitted with 3 day history of CP PTA. On the day of admission, after using the bathroom, she had a syncopal event from a standing position.  EMS report states patient was observed to clutch her chest but patient does not recall this per MD note.  She hit her head but CT scan negative for bleed. Troponin in ED 6.7. Husband states pt has had many falls.   Clinical Impression  Pt admitted with the above. Pt currently with functional limitations due to the deficits listed below (see PT Problem List). At the time of PT eval, husband states pt from SNF and will return to SNF. Per husband, pt doing minimal ambulating PTA. Pt appears very lethargic and weak, stating through the interpreter that she cannot move. Minimal AROM demonstrated during session. Pt will benefit from skilled PT to increase their independence and safety with mobility to allow discharge to the venue listed below.      Follow Up Recommendations SNF;Supervision/Assistance - 24 hour    Equipment Recommendations  None recommended by PT    Recommendations for Other Services       Precautions / Restrictions Precautions Precautions: Fall Restrictions Weight Bearing Restrictions: No      Mobility  Bed Mobility               General bed mobility comments: Unable to be assessed at this time due to no +2 assist available. Pt lethargic and was only able to show minimal active movement of LE's.   Transfers                    Ambulation/Gait                Stairs            Wheelchair Mobility    Modified Rankin (Stroke Patients Only)       Balance Overall balance assessment: History of Falls                                           Pertinent Vitals/Pain Vitals stable throughout session. BP  at 87/52 with a MAP of 63.    Home Living Family/patient expects to be discharged to:: Skilled nursing facility                      Prior Function Level of Independence: Needs assistance   Gait / Transfers Assistance Needed: Per husband, just starting to ambulate with PT at rehab prior to this incident.   ADL's / Homemaking Assistance Needed: Assist for bathing/dressing        Hand Dominance        Extremity/Trunk Assessment   Upper Extremity Assessment: Generalized weakness (Weak grip strength, does not show active UE elevation)           Lower Extremity Assessment: Generalized weakness         Communication   Communication: Prefers language other than [place];Interpreter utilized (Russian-speaking only. Husband speaks minimal [place].)  Cognition Arousal/Alertness: Lethargic Behavior During Therapy: Flat affect Overall Cognitive Status: Difficult to assess                      General Comments General  comments (skin integrity, edema, etc.): Pt demonstrating ankle pumps, minimal ROM of SAQ's, and quad sets. When therapist attempted to assist pt with ROM, pt immediately stopped participating and it became PROM.     Exercises        Assessment/Plan    PT Assessment Patient needs continued PT services  PT Diagnosis Difficulty walking;Generalized weakness   PT Problem List Decreased strength;Decreased range of motion;Decreased activity tolerance;Decreased balance;Decreased mobility;Decreased knowledge of use of DME;Decreased safety awareness;Decreased knowledge of precautions  PT Treatment Interventions DME instruction;Gait training;Stair training;Functional mobility training;Therapeutic activities;Therapeutic exercise;Neuromuscular re-education;Patient/family education   PT Goals (Current goals can be found in the Care Plan section) Acute Rehab PT Goals Patient Stated Goal: Unable to participate in goal setting.  PT Goal Formulation: Patient  unable to participate in goal setting Time For Goal Achievement: 11/10/13 Potential to Achieve Goals: Fair    Frequency Min 2X/week   Barriers to discharge        Co-evaluation               End of Session Equipment Utilized During Treatment: Oxygen Activity Tolerance: Patient limited by lethargy Patient left: in bed;with call bell/phone within reach;with family/visitor present Nurse Communication: Mobility status;Need for lift equipment         Time: 0910-0936 PT Time Calculation (min): 26 min   Charges:   PT Evaluation $Initial PT Evaluation Tier I: 1 Procedure PT Treatments $Therapeutic Activity: 8-22 mins   PT G Codes:          Jolyn Lent 10/27/2013, 9:54 AM  Jolyn Lent, PT, DPT Acute Rehabilitation Services Pager: [phone]

## 2013-10-27 NOTE — Progress Notes (Signed)
     SUBJECTIVE: pt somnolent. She has no complaints. Husband at bedside.   BP 87/52  Pulse 78  Temp(Src) 98.3 F (36.8 C) (Oral)  Resp 29  Ht 5\' 2"  (1.575 m)  Wt 153 lb (69.4 kg)  BMI 27.98 kg/m2  SpO2 99%  Intake/Output Summary (Last 24 hours) at 10/27/13 0947 Last data filed at 10/27/13 0900  Gross per 24 hour  Intake 1583.95 ml  Output    480 ml  Net 1103.95 ml    PHYSICAL EXAM General: Well developed, well nourished, ill appearing.   Neck: No JVD. No masses noted.  Lungs: Clear bilaterally with no wheezes or rhonci noted.  Heart: RRR  Abdomen: Bowel sounds are present. Soft, non-tender.  Extremities: No lower extremity edema.   LABS: Basic Metabolic Panel:  Recent Labs  10/25/13 0805 10/26/13 0410 10/27/13 0250  NA 140 135* 135*  K 3.5* 3.3* 4.1  CL 100 100 98  CO2 25 20 20   GLUCOSE 184* 170* 169*  BUN 41* 40* 46*  CREATININE 1.60* 1.40* 1.43*  CALCIUM 8.7 7.9* 8.4  MG 2.3  --   --    CBC:  Recent Labs  10/26/13 0410 10/27/13 0250  WBC 35.4* 53.3*  HGB 8.5* 9.4*  HCT 26.2* 29.0*  MCV 95.3 94.2  PLT 238 339   Current Meds: . amiodarone  400 mg Oral BID  . aspirin EC  81 mg Oral Daily  . clopidogrel  75 mg Oral Q breakfast  . conjugated estrogens  1 g Vaginal Once per day on Mon Thu  . divalproex  500 mg Oral TID  . docusate sodium  100 mg Oral BID  . fidaxomicin  200 mg Oral BID  . FLUoxetine  10 mg Oral Daily  . pantoprazole  40 mg Oral BID AC  . sodium chloride  3 mL Intravenous Q12H    ASSESSMENT AND PLAN:   1. CAD/NSTEMI/Unstable angina: She is s/p CABG and most recent cath October 22, 2013 with BMS of SVG to diagonal and PTCA with cutting balloon angioplasty of SVG to PDA. Continue ASA and Plavix. No recurrent angina.   2. Atrial fibrillation with RVR: She has been on IV amiodarone. Now in sinus. Will change to amiodarone 400 mg po BID. Continue heparin drip. Will need to discuss long term anti-coagulation as her clinical picture  changes.   3. Severe sepsis/UTI: WBC continues to increase. Per primary team. ID consulted. CT abdomen this am per primary team.   4. Acute renal failure: Post cath. Renal function is stable.   5. Hypokalemia: Resolved.   6. C. Diff: Per primary team.    Lauree Chandler  6/23/20159:47 AM

## 2013-10-27 NOTE — Consult Note (Signed)
[facility]  Total days of antibiotics 2        Day 2 dificid               Reason for Consult: severe cdifficile    Referring Physician: zamora  Principal Problem:   NSTEMI (non-ST elevated myocardial infarction) Active Problems:   Syncope   Hx of CABG   Septic shock   C. difficile colitis    HPI: [name] is a 80 y.o. female  is a retired Designer, television/film set, with a history of CAD, CABG x4(LIMA to LAD, SVG to diagonal, SVG to OM, SVG to PDA) in 2004, PAD, depression, breast cancer. She was admitted  two weeks ago with klebsiella (R Amp, R bactrim) UTI, confusion. Seen by Cardiology at that time for NSVT (9 beat run in ED). EKG with prominent lateral T-wave changes but had normal troponin. Echo showed small wall motion abnormality but NSVT did not recur and there was no chest pain that was medically managed. She was treated for uti and discharged to SNF, but at the SNF had intermittent chest pain and syncope. She was admitted for evaluation and found to have NSTEMI on 6/17. She underwent PCI and had post procedure encephalopathy as well as leukocytosis. Initially thought to be due to steroids, but continued to worsen over the next 3 days, wbc of 53K. Initial infectious work up with blood cx and urine cx were non revealing due to worsening leukocytosis she was started on ceftriaxone on 6/19 given concern for possible recurrent uti, then expanded to vanco and cefepime 2 days later due to worsening leukocytosis. She started to have loose stools on 6/22 for which + cdifficile. Her antiobitcs were changed to dificid and all other antimicrobials discontinued. Given that she had wbc of 52K, cr 1.43, albumin <2 and elderly age, concern for severe cdifficile. But patient has reported allergy to metronidazole. She underwent abd CT that showed colitis but no toxic megacolon.she is on day #2 of dificid, not yet improving. This information has all been extracted from the  chart since patient is russian speaking, no Optometrist available.    Past Medical History  Diagnosis Date  . CAD (coronary artery disease)     Catheterization, January, 2007 patent grafts / hospitalization October 2 010 no MI  . Syncope 03/2009    ?orthostasis  . Allergic rhinitis   . History of colon polyps   . Diverticulosis of colon   . GERD (gastroesophageal reflux disease)   . Hyperlipidemia   . Abdominal pain, other specified site     motility disorder, chorinic functional, severe (Dr Olevia Perches). Possible OCD w/complusive use of enemas and laxatives; psychotic features 2011  . Positive H. pylori test 2911  . Psychosomatic disorder     w/GI fixation  . Anxiety   . Depression   . Stress incontinence, female   . Vitamin D deficiency   . Vitamin B12 deficiency   . Renal insufficiency   . UTI (urinary tract infection)   . Hypokalemia     mild  . Insomnia   . Carotid artery disease     Doppler November, 7169, 6-78% LICA, 93-81% R. ICA.Marland Kitchen increased velocity... possibly from serpentine vessel  . Ejection fraction     50-55%, no wall motion abnormalities, echo, November, 2010  . Edema   . Hemothorax on right   . Breast cancer 2011    Dr Humphrey Rolls- right breast  . Stroke  a. Prior infarct seen on head CT 10/2013.    Allergies:  Allergies  Allergen Reactions  . Baclofen     REACTION: confusion  . Belladonna Other (See Comments)    unknown  . Doxycycline Other (See Comments)    unknown  . Iohexol      Code: RASH   . Metoclopramide     Clonic spasms  . Metronidazole     REACTION: rash  . Nitrofurantoin     REACTION: nausea  . Procaine Hcl   . Sucralfate     REACTION: nausea  . Ciprofloxacin Itching and Rash  . Erythromycin Rash and Other (See Comments)    fever  . Gabapentin Rash  . Methenamine Rash  . Nalbuphine Rash  . Penicillins Rash  . Sulfonamide Derivatives Rash    Current antibiotics:   MEDICATIONS: . amiodarone  400 mg Oral BID  . aspirin EC   81 mg Oral Daily  . clopidogrel  75 mg Oral Q breakfast  . conjugated estrogens  1 g Vaginal Once per day on Mon Thu  . divalproex  500 mg Oral TID  . docusate sodium  100 mg Oral BID  . fidaxomicin  200 mg Oral BID  . FLUoxetine  10 mg Oral Daily  . pantoprazole  40 mg Oral BID AC  . sodium chloride  500 mL Intravenous Once  . sodium chloride  3 mL Intravenous Q12H    History  Substance Use Topics  . Smoking status: Never Smoker   . Smokeless tobacco: Never Used  . Alcohol Use: No    Family History  Problem Relation Age of Onset  . Coronary artery disease      Female 1st degree relative <60  . Colon cancer Mother   . Arthritis Mother   . Heart disease Mother   . Heart disease Father      Review of Systems  Constitutional: + fatigue,Negative for fever, chills, diaphoresis, activity change, appetite change, and unexpected weight change.  HENT: Negative for congestion, sore throat, rhinorrhea, sneezing, trouble swallowing and sinus pressure.  Eyes: Negative for photophobia and visual disturbance.  Respiratory: Negative for cough, chest tightness, shortness of breath, wheezing and stridor.  Cardiovascular: Negative for chest pain, palpitations and leg swelling.  Gastrointestinal: + abdominal pain, diarrhea Genitourinary: Negative for dysuria, hematuria, flank pain and difficulty urinating.  Musculoskeletal: Negative for myalgias, back pain, joint swelling, arthralgias and gait problem.  Skin: Negative for color change, pallor, rash and wound.  Neurological: Negative for dizziness, tremors, weakness and light-headedness.  Hematological: Negative for adenopathy. Does not bruise/bleed easily.  Psychiatric/Behavioral: Negative for behavioral problems, confusion, sleep disturbance, dysphoric mood, decreased concentration and agitation.     OBJECTIVE: Temp:  [97.6 F (36.4 C)-99.1 F (37.3 C)] 98.8 F (37.1 C) (06/23 1611) Resp:  [25-37] 28 (06/23 1611) BP:  (77-137)/(33-100) 81/56 mmHg (06/23 1611) SpO2:  [96 %-100 %] 96 % (06/23 1611)  General: Chronically ill appearing elderly female, NAD  Neuro: Awake, alert, russian speaking, MAE, does follow some commands  HEENT: Mm moist, no JVD  Cardiovascular: s1s2 rrr, now NSR  Lungs: resps even non labored on RA, mild tachypnea, diminished bases, no audible wheeze or crackle  Abdomen: Soft, +bs, NT, mildly distended Musculoskeletal: Warm and dry, no edema   LABS: Results for orders placed during the hospital encounter of 10/16/2013 (from the past 48 hour(s))  HEPARIN LEVEL (UNFRACTIONATED)     Status: Abnormal   Collection Time    10/25/13  7:00 PM      Result Value Ref Range   Heparin Unfractionated 0.25 (*) 0.30 - 0.70 IU/mL   Comment:            IF HEPARIN RESULTS ARE BELOW     EXPECTED VALUES, AND PATIENT     DOSAGE HAS BEEN CONFIRMED,     SUGGEST FOLLOW UP TESTING     OF ANTITHROMBIN III LEVELS.  CBC     Status: Abnormal   Collection Time    10/26/13  4:10 AM      Result Value Ref Range   WBC 35.4 (*) 4.0 - 10.5 K/uL   RBC 2.75 (*) 3.87 - 5.11 MIL/uL   Hemoglobin 8.5 (*) 12.0 - 15.0 g/dL   HCT 26.2 (*) 36.0 - 46.0 %   MCV 95.3  78.0 - 100.0 fL   MCH 30.9  26.0 - 34.0 pg   MCHC 32.4  30.0 - 36.0 g/dL   RDW 14.1  11.5 - 15.5 %   Platelets 238  150 - 400 K/uL  COMPREHENSIVE METABOLIC PANEL     Status: Abnormal   Collection Time    10/26/13  4:10 AM      Result Value Ref Range   Sodium 135 (*) 137 - 147 mEq/L   Potassium 3.3 (*) 3.7 - 5.3 mEq/L   Chloride 100  96 - 112 mEq/L   CO2 20  19 - 32 mEq/L   Glucose, Bld 170 (*) 70 - 99 mg/dL   BUN 40 (*) 6 - 23 mg/dL   Creatinine, Ser 1.40 (*) 0.50 - 1.10 mg/dL   Calcium 7.9 (*) 8.4 - 10.5 mg/dL   Total Protein 4.9 (*) 6.0 - 8.3 g/dL   Albumin 1.4 (*) 3.5 - 5.2 g/dL   AST 29  0 - 37 U/L   ALT 17  0 - 35 U/L   Alkaline Phosphatase 126 (*) 39 - 117 U/L   Total Bilirubin 0.2 (*) 0.3 - 1.2 mg/dL   GFR calc non Af Amer 34 (*) >90  mL/min   GFR calc Af Amer 39 (*) >90 mL/min   Comment: (NOTE)     The eGFR has been calculated using the CKD EPI equation.     This calculation has not been validated in all clinical situations.     eGFR's persistently <90 mL/min signify possible Chronic Kidney     Disease.  HEPARIN LEVEL (UNFRACTIONATED)     Status: Abnormal   Collection Time    10/26/13  4:10 AM      Result Value Ref Range   Heparin Unfractionated 0.17 (*) 0.30 - 0.70 IU/mL   Comment:            IF HEPARIN RESULTS ARE BELOW     EXPECTED VALUES, AND PATIENT     DOSAGE HAS BEEN CONFIRMED,     SUGGEST FOLLOW UP TESTING     OF ANTITHROMBIN III LEVELS.  PROCALCITONIN     Status: None   Collection Time    10/26/13  4:10 AM      Result Value Ref Range   Procalcitonin 0.65     Comment:            Interpretation:     PCT > 0.5 ng/mL and <= 2 ng/mL:     Systemic infection (sepsis) is possible,     but other conditions are known to elevate     PCT as well.     (NOTE)  ICU PCT Algorithm               Non ICU PCT Algorithm        ----------------------------     ------------------------------             PCT < 0.25 ng/mL                 PCT < 0.1 ng/mL         Stopping of antibiotics            Stopping of antibiotics           strongly encouraged.               strongly encouraged.        ----------------------------     ------------------------------           PCT level decrease by               PCT < 0.25 ng/mL           >= 80% from peak PCT           OR PCT 0.25 - 0.5 ng/mL          Stopping of antibiotics                                                 encouraged.         Stopping of antibiotics               encouraged.        ----------------------------     ------------------------------           PCT level decrease by              PCT >= 0.25 ng/mL           < 80% from peak PCT            AND PCT >= 0.5 ng/mL            Continuing antibiotics                                                   encouraged.           Continuing antibiotics                encouraged.        ----------------------------     ------------------------------         PCT level increase compared          PCT > 0.5 ng/mL             with peak PCT AND              PCT >= 0.5 ng/mL             Escalation of antibiotics                                              strongly encouraged.          Escalation of antibiotics  strongly encouraged.  CLOSTRIDIUM DIFFICILE BY PCR     Status: Abnormal   Collection Time    10/26/13  9:17 AM      Result Value Ref Range   C difficile by pcr POSITIVE (*) NEGATIVE   Comment: CRITICAL RESULT CALLED TO, READ BACK BY AND VERIFIED WITH:     Christain [place] RN 11:10 10/26/13 ([email or web address])  HEPARIN LEVEL (UNFRACTIONATED)     Status: Abnormal   Collection Time    10/26/13  7:04 PM      Result Value Ref Range   Heparin Unfractionated 0.21 (*) 0.30 - 0.70 IU/mL   Comment:            IF HEPARIN RESULTS ARE BELOW     EXPECTED VALUES, AND PATIENT     DOSAGE HAS BEEN CONFIRMED,     SUGGEST FOLLOW UP TESTING     OF ANTITHROMBIN III LEVELS.  CBC     Status: Abnormal   Collection Time    10/27/13  2:50 AM      Result Value Ref Range   WBC 53.3 (*) 4.0 - 10.5 K/uL   Comment: REPEATED TO VERIFY     CRITICAL RESULT CALLED TO, READ BACK BY AND VERIFIED WITH:     H. PADGETT (RN) [phone] 10/27/2013 L. LOMAX   RBC 3.08 (*) 3.87 - 5.11 MIL/uL   Hemoglobin 9.4 (*) 12.0 - 15.0 g/dL   HCT 29.0 (*) 36.0 - 46.0 %   MCV 94.2  78.0 - 100.0 fL   MCH 30.5  26.0 - 34.0 pg   MCHC 32.4  30.0 - 36.0 g/dL   RDW 14.3  11.5 - 15.5 %   Platelets 339  150 - 400 K/uL   Comment: DELTA CHECK NOTED     REPEATED TO VERIFY  HEPARIN LEVEL (UNFRACTIONATED)     Status: Abnormal   Collection Time    10/27/13  2:50 AM      Result Value Ref Range   Heparin Unfractionated 0.26 (*) 0.30 - 0.70 IU/mL   Comment:            IF HEPARIN RESULTS ARE BELOW     EXPECTED VALUES, AND PATIENT     DOSAGE HAS BEEN  CONFIRMED,     SUGGEST FOLLOW UP TESTING     OF ANTITHROMBIN III LEVELS.  BASIC METABOLIC PANEL     Status: Abnormal   Collection Time    10/27/13  2:50 AM      Result Value Ref Range   Sodium 135 (*) 137 - 147 mEq/L   Potassium 4.1  3.7 - 5.3 mEq/L   Comment: DELTA CHECK NOTED     NO VISIBLE HEMOLYSIS   Chloride 98  96 - 112 mEq/L   CO2 20  19 - 32 mEq/L   Glucose, Bld 169 (*) 70 - 99 mg/dL   BUN 46 (*) 6 - 23 mg/dL   Creatinine, Ser 1.43 (*) 0.50 - 1.10 mg/dL   Calcium 8.4  8.4 - 10.5 mg/dL   GFR calc non Af Amer 33 (*) >90 mL/min   GFR calc Af Amer 38 (*) >90 mL/min   Comment: (NOTE)     The eGFR has been calculated using the CKD EPI equation.     This calculation has not been validated in all clinical situations.     eGFR's persistently <90 mL/min signify possible Chronic Kidney     Disease.  PROCALCITONIN     Status: None   Collection Time  10/27/13  2:50 AM      Result Value Ref Range   Procalcitonin 0.96     Comment:            Interpretation:     PCT > 0.5 ng/mL and <= 2 ng/mL:     Systemic infection (sepsis) is possible,     but other conditions are known to elevate     PCT as well.     (NOTE)             ICU PCT Algorithm               Non ICU PCT Algorithm        ----------------------------     ------------------------------             PCT < 0.25 ng/mL                 PCT < 0.1 ng/mL         Stopping of antibiotics            Stopping of antibiotics           strongly encouraged.               strongly encouraged.        ----------------------------     ------------------------------           PCT level decrease by               PCT < 0.25 ng/mL           >= 80% from peak PCT           OR PCT 0.25 - 0.5 ng/mL          Stopping of antibiotics                                                 encouraged.         Stopping of antibiotics               encouraged.        ----------------------------     ------------------------------           PCT level  decrease by              PCT >= 0.25 ng/mL           < 80% from peak PCT            AND PCT >= 0.5 ng/mL            Continuing antibiotics                                                  encouraged.           Continuing antibiotics                encouraged.        ----------------------------     ------------------------------         PCT level increase compared          PCT > 0.5 ng/mL             with peak PCT AND  PCT >= 0.5 ng/mL             Escalation of antibiotics                                              strongly encouraged.          Escalation of antibiotics            strongly encouraged.  DIFFERENTIAL     Status: Abnormal   Collection Time    10/27/13  2:50 AM      Result Value Ref Range   Neutrophils Relative % 90 (*) 43 - 77 %   Neutro Abs 44.9 (*) 1.7 - 7.7 K/uL   Lymphocytes Relative 1 (*) 12 - 46 %   Lymphs Abs 0.7  0.7 - 4.0 K/uL   Monocytes Relative 8  3 - 12 %   Monocytes Absolute 4.1 (*) 0.1 - 1.0 K/uL   Eosinophils Relative 1  0 - 5 %   Eosinophils Absolute 0.4  0.0 - 0.7 K/uL   Basophils Relative 0  0 - 1 %   Basophils Absolute 0.0  0.0 - 0.1 K/uL   WBC Morphology INCREASED BANDS (>20% BANDS)     Comment: MILD LEFT SHIFT (1-5% METAS, OCC MYELO, OCC BANDS)  LACTIC ACID, PLASMA     Status: Abnormal   Collection Time    10/27/13  9:30 AM      Result Value Ref Range   Lactic Acid, Venous 2.7 (*) 0.5 - 2.2 mmol/L  HEPARIN LEVEL (UNFRACTIONATED)     Status: None   Collection Time    10/27/13 11:05 AM      Result Value Ref Range   Heparin Unfractionated 0.35  0.30 - 0.70 IU/mL   Comment:            IF HEPARIN RESULTS ARE BELOW     EXPECTED VALUES, AND PATIENT     DOSAGE HAS BEEN CONFIRMED,     SUGGEST FOLLOW UP TESTING     OF ANTITHROMBIN III LEVELS.    MICRO: 6/22 cdiff + 6/21 blood cx NGTD 6/21 urine cx NGTD IMAGING: Ct Abdomen Pelvis Wo Contrast  10/27/2013   CLINICAL DATA:  Abdominal pain. C difficile colitis. Rising elevated  white blood count.  EXAM: CT ABDOMEN AND PELVIS WITHOUT CONTRAST  TECHNIQUE: Multidetector CT imaging of the abdomen and pelvis was performed following the standard protocol without IV contrast.  COMPARISON:  CT scan dated 11/30/2008  FINDINGS: There is a small left pleural effusion there is focal bronchiectasis at the right lung base posterior medially. There is a tiny calcified granuloma in the lingula of the left upper lobe.  There is a new small pericardial effusion. Overall heart size is normal. There is moderate anasarca most prominent in the flanks and lateral aspects of the buttocks.  There are innumerable tiny stones in the nondistended gallbladder. No dilated bile ducts. Liver parenchyma is normal as is the spleen. There is diffuse pancreatic atrophy. There is an 11 mm subtle nodule in the right adrenal gland consistent with a small adenoma, unchanged. Kidneys are normal.  There is marked edema of the mucosa of the colon from the cecum through the rectum with a large amount of stool in the rectum which may represent a fecal impaction. There is a small amount of ascites as well as colonic soft  tissue stranding. The colon is not distended to suggest toxic mega pole colon.  There are 2 fibroids in the uterus, 1 calcified. Ovaries are normal.  No free air in the abdomen. No acute osseous abnormality. Grade 1 spondylolisthesis of L4 on L5.  IMPRESSION: Severe diffuse colitis. Cholelithiasis. Large amount of stool in the rectum.   Electronically Signed   By: Rozetta Nunnery M.D.   On: 10/27/2013 13:45   US Renal  10/26/2013   CLINICAL DATA:  Urosepsis, renal insufficiency  EXAM: RENAL/URINARY TRACT ULTRASOUND COMPLETE  COMPARISON:  Ultrasound 07/22/2010  FINDINGS: Right Kidney:  Length: 9.8 cm. Echogenicity within normal limits. No mass or hydronephrosis visualized.  Left Kidney:  Length: 10.2 cm. Echogenicity within normal limits. No mass or hydronephrosis visualized.  Bladder:  Bladder collapsed from Foley  catheter.  IMPRESSION: Normal kidneys.  No hydronephrosis.  No obstruction.   Electronically Signed   By: Suzy Bouchard M.D.   On: 10/26/2013 16:38    HISTORICAL MICRO/IMAGING  Assessment/Plan:  80yo F with severe cdifficile colitis  - recommend to continue with dificid but if not improvement in the next 2-3days, would switch to high dose vancomycin. Will avoid addn of IV metronidazole due to rash - if she develops ileus, would recommend that she has vancomycin enemas - likely sequelae of having recent antibiotics prior to admit (including clindamycin) - no need for other antibiotics other than treatment of cdiff  [facility] [phone]

## 2013-10-27 NOTE — Progress Notes (Signed)
ANTICOAGULATION CONSULT NOTE - Follow Up Consult  Pharmacy Consult for Heparin  Indication: atrial fibrillation  Patient Measurements: Height: 5\' 2"  (157.5 cm) Weight: 153 lb (69.4 kg) IBW/kg (Calculated) : 50.1  Vital Signs: Temp: 98.8 F (37.1 C) (06/23 2044) Temp src: Axillary (06/23 2044) BP: 108/52 mmHg (06/23 2044)  Labs:  Recent Labs  10/25/13 0230 10/25/13 0805  10/26/13 0410  10/27/13 0250 10/27/13 1105 10/27/13 2158  HGB 9.4*  --   --  8.5*  --  9.4*  --   --   HCT 29.7*  --   --  26.2*  --  29.0*  --   --   PLT 267  --   --  238  --  339  --   --   HEPARINUNFRC  --   --   < > 0.17*  < > 0.26* 0.35 0.34  CREATININE  --  1.60*  --  1.40*  --  1.43*  --   --   < > = values in this interval not displayed.  Estimated Creatinine Clearance: 27.2 ml/min (by C-G formula based on Cr of 1.43).   Medications:  Heparin 1550 units/hr  Assessment: 80 y/o F on heparin for afib. HL is 0.34 (therapeutic x 2). Other labs as above.   Goal of Therapy:  Heparin level 0.3-0.7 units/ml Monitor platelets by anticoagulation protocol: Yes   Plan:  -Continue heparin at 1550 units/hr -Daily CBC/HL -Monitor for bleeding  Narda Bonds 10/27/2013,11:05 PM

## 2013-10-27 NOTE — Progress Notes (Signed)
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  Returned 6/17 with syncope and chest pain. Admitted by cardiology with NSTEMI with [facility] consult.  Underwent cardiac cath and PCI with BMS 6/18. PCCM consulted 6/21 for sepsis.   SIGNIFICANT EVENTS / STUDIES:  6/18 Cardiac cath>>> severe disease in 2 vein grafts, severe native CAD with occluded RCA< LAC and circumflex - bare metal stent to mid SVG   LINES / TUBES: PIV  CULTURES: BCx2 6/21>>> Urine 6/21>>>  ANTIBIOTICS: Rocephin 6/18>>> Vanc 6/21>>> Cefepime 6/21>>>   SUBJECTIVE: No events overnight, lethargic after receiving methadone.  VITAL SIGNS: Temp:  [97.6 F (36.4 C)-99.4 F (37.4 C)] 97.6 F (36.4 C) (06/23 1134) Resp:  [25-37] 25 (06/23 1134) BP: (77-137)/(33-100) 93/40 mmHg (06/23 1134) SpO2:  [96 %-100 %] 100 % (06/23 1134) HEMODYNAMICS:   VENTILATOR SETTINGS:   INTAKE / OUTPUT: Intake/Output     06/22 0701 - 06/23 0700 06/23 0701 - 06/24 0700   P.O.     I.V. (mL/kg) 1791.4 (25.8) 50 (0.7)   IV Piggyback     Total Intake(mL/kg) 1791.4 (25.8) 50 (0.7)   Urine (mL/kg/hr) 535 (0.3) 20 (0.1)   Stool     Total Output 535 20   Net +1256.4 +30        Stool Occurrence 3 x     PHYSICAL EXAMINATION: General:  Chronically ill appearing elderly female, NAD  Neuro:  Awake, alert, russian speaking, MAE, does follow some commands HEENT:  Mm moist, no JVD Cardiovascular:  s1s2 rrr, now NSR Lungs:  resps even non labored on RA, mild tachypnea, diminished bases, no audible wheeze or crackle  Abdomen:  Soft,  +bs Musculoskeletal:  Warm and dry, no edema   LABS:  CBC  Recent Labs Lab 10/25/13 0230 10/26/13 0410 10/27/13 0250  WBC 33.3* 35.4* 53.3*  HGB 9.4* 8.5* 9.4*  HCT 29.7* 26.2* 29.0*  PLT 267 238 339   Coag's  Recent Labs Lab 10/12/2013 0200  INR 1.10   BMET  Recent Labs Lab 10/25/13 0805 10/26/13 0410 10/27/13 0250  NA 140 135* 135*  K 3.5* 3.3* 4.1  CL 100 100 98  CO2 25 20 20   BUN 41* 40* 46*  CREATININE 1.60* 1.40* 1.43*  GLUCOSE 184* 170* 169*   Electrolytes  Recent Labs Lab 10/13/2013 0350  10/25/13 0805 10/26/13 0410 10/27/13 0250  CALCIUM  --   < > 8.7 7.9* 8.4  MG 2.2  --  2.3  --   --   < > = values in this interval not displayed. Sepsis Markers  Recent Labs Lab 10/24/13 1005 10/25/13 0805 10/25/13 1430 10/26/13 0410 10/27/13 0250 10/27/13 0930  LATICACIDVEN 2.1 1.5  --   --   --  2.7*  PROCALCITON  --   --  1.68 0.65 0.96  --    ABG  Recent Labs Lab 10/25/13 0810  PHART 7.451*  PCO2ART 36.7  PO2ART 84.6   Liver Enzymes  Recent Labs Lab 10/26/13 0410  AST 29  ALT 17  ALKPHOS 126*  BILITOT 0.2*  ALBUMIN 1.4*   Cardiac Enzymes  Recent  Labs Lab 10/27/2013 0915 10/24/13 0315  TROPONINI 6.18*  --   PROBNP  --  4016.0*   Glucose  Recent Labs Lab 10/23/13 1421  GLUCAP 179*    Imaging US Renal  10/26/2013   CLINICAL DATA:  Urosepsis, renal insufficiency  EXAM: RENAL/URINARY TRACT ULTRASOUND COMPLETE  COMPARISON:  Ultrasound 07/22/2010  FINDINGS: Right Kidney:  Length: 9.8 cm. Echogenicity within normal limits. No mass or hydronephrosis visualized.  Left Kidney:  Length: 10.2 cm. Echogenicity within normal limits. No mass or hydronephrosis visualized.  Bladder:  Bladder collapsed from Foley catheter.  IMPRESSION: Normal kidneys.  No hydronephrosis.  No obstruction.   Electronically Signed   By: Suzy Bouchard M.D.   On: 10/26/2013 16:38   ASSESSMENT / PLAN:  PULMONARY Tachypnea  P:   - Pulm hygiene  - F/u CXR   - Supplemental O2 as needed   CARDIOVASCULAR Afib RVR  CAD  NSTEMI - s/p PCI 6/18 Hypotension - seems r/t RVR - much improved with rate control  Chronic dCHF SIRS/sepsis - lactate reassuring  P:  - Amiodarone gtt per cards  - Asa, plavix  - Lactate WNL - Heparin gtt per cards  - PICC line to be placed today. - Gentle hydration.  RENAL Hypokalemia  Acute on chronic renal insufficiency - baseline Scr ~0.9-1.0 P:   - Gentle volume. - F/u CBC. - Replace electrolytes as indicated.  GASTROINTESTINAL No active issue  P:   - Po diet as tol   HEMATOLOGIC Leukocytosis  Anemia - mild  P:  - F/u cbc  - Heparin gtt per cards   INFECTIOUS Presumed UTI (previous klebsiella UTI)  Sepsis  C. Diff positive P:   - Abx per ID  ENDOCRINE No active issue   P:   - Monitor glucose on chem   NEUROLOGIC Chronic pain  Psychosomatic do  Hx psychosis  P:   - Cont methadone per [facility]  - Avoid oversedation   PCCM will sign off, please call back if needed.  I have personally obtained a history, examined the patient, evaluated laboratory and imaging results, formulated the assessment and plan and placed orders.  Rush Farmer, M.D. [facility] Pulmonary/Critical Care Medicine. Pager: [phone]. After hours pager: [phone].

## 2013-10-28 ENCOUNTER — Inpatient Hospital Stay (HOSPITAL_COMMUNITY): Payer: Medicare Other

## 2013-10-28 DIAGNOSIS — G9340 Encephalopathy, unspecified: Secondary | ICD-10-CM | POA: Diagnosis not present

## 2013-10-28 DIAGNOSIS — I214 Non-ST elevation (NSTEMI) myocardial infarction: Secondary | ICD-10-CM | POA: Diagnosis not present

## 2013-10-28 DIAGNOSIS — R1011 Right upper quadrant pain: Secondary | ICD-10-CM

## 2013-10-28 DIAGNOSIS — A0472 Enterocolitis due to Clostridium difficile, not specified as recurrent: Secondary | ICD-10-CM | POA: Diagnosis not present

## 2013-10-28 DIAGNOSIS — A419 Sepsis, unspecified organism: Secondary | ICD-10-CM

## 2013-10-28 LAB — CBC
HCT: 31.4 % — ABNORMAL LOW (ref 36.0–46.0)
HEMOGLOBIN: 10.3 g/dL — AB (ref 12.0–15.0)
MCH: 31.3 pg (ref 26.0–34.0)
MCHC: 32.8 g/dL (ref 30.0–36.0)
MCV: 95.4 fL (ref 78.0–100.0)
Platelets: 390 10*3/uL (ref 150–400)
RBC: 3.29 MIL/uL — ABNORMAL LOW (ref 3.87–5.11)
RDW: 14.6 % (ref 11.5–15.5)
WBC: 62.1 10*3/uL (ref 4.0–10.5)

## 2013-10-28 LAB — BASIC METABOLIC PANEL
BUN: 53 mg/dL — ABNORMAL HIGH (ref 6–23)
CO2: 15 mEq/L — ABNORMAL LOW (ref 19–32)
Calcium: 8.3 mg/dL — ABNORMAL LOW (ref 8.4–10.5)
Chloride: 99 mEq/L (ref 96–112)
Creatinine, Ser: 1.78 mg/dL — ABNORMAL HIGH (ref 0.50–1.10)
GFR calc Af Amer: 29 mL/min — ABNORMAL LOW (ref >90)
GFR calc non Af Amer: 25 mL/min — ABNORMAL LOW (ref >90)
Glucose, Bld: 170 mg/dL — ABNORMAL HIGH (ref 70–99)
POTASSIUM: 4.5 meq/L (ref 3.7–5.3)
SODIUM: 134 meq/L — AB (ref 137–147)

## 2013-10-28 LAB — POCT I-STAT 3, ART BLOOD GAS (G3+)
Acid-base deficit: 9 mmol/L — ABNORMAL HIGH (ref 0.0–2.0)
Bicarbonate: 14.3 mEq/L — ABNORMAL LOW (ref 20.0–24.0)
O2 SAT: 96 %
PH ART: 7.399 (ref 7.350–7.450)
TCO2: 15 mmol/L (ref 0–100)
pCO2 arterial: 23.2 mmHg — ABNORMAL LOW (ref 35.0–45.0)
pO2, Arterial: 78 mmHg — ABNORMAL LOW (ref 80.0–100.0)

## 2013-10-28 LAB — LACTIC ACID, PLASMA: Lactic Acid, Venous: 4.4 mmol/L — ABNORMAL HIGH (ref 0.5–2.2)

## 2013-10-28 LAB — HEPARIN LEVEL (UNFRACTIONATED): Heparin Unfractionated: 0.31 IU/mL (ref 0.30–0.70)

## 2013-10-28 MED ORDER — FUROSEMIDE 10 MG/ML IJ SOLN
40.0000 mg | Freq: Once | INTRAMUSCULAR | Status: AC
  Administered 2013-10-28: 40 mg via INTRAVENOUS

## 2013-10-28 MED ORDER — FUROSEMIDE 10 MG/ML IJ SOLN
INTRAMUSCULAR | Status: AC
Start: 2013-10-28 — End: 2013-10-28
  Filled 2013-10-28: qty 4

## 2013-10-28 MED ORDER — MORPHINE SULFATE 10 MG/ML IJ SOLN
1.0000 mg/h | INTRAVENOUS | Status: DC
  Administered 2013-10-28: 1 mg/h via INTRAVENOUS
  Filled 2013-10-28: qty 10

## 2013-10-28 MED ORDER — LORAZEPAM 2 MG/ML IJ SOLN: 1.0000 mg | INTRAMUSCULAR | Status: DC | PRN

## 2013-10-28 MED ORDER — SODIUM CHLORIDE 0.9 % IV BOLUS (SEPSIS)
500.0000 mL | Freq: Once | INTRAVENOUS | Status: AC
  Administered 2013-10-28: 500 mL via INTRAVENOUS

## 2013-10-28 MED ORDER — SODIUM CHLORIDE 0.9 % IV SOLN
1.0000 mg/h | INTRAVENOUS | Status: DC
  Filled 2013-10-28: qty 10

## 2013-10-28 MED ORDER — VANCOMYCIN 50 MG/ML ORAL SOLUTION
500.0000 mg | Freq: Four times a day (QID) | ORAL | Status: DC
  Filled 2013-10-28 (×4): qty 10

## 2013-10-28 NOTE — Consult Note (Signed)
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  Returned 6/17 with syncope and chest pain. Admitted by cardiology with NSTEMI with [facility] consult.  Underwent cardiac cath and PCI with BMS 6/18. PCCM consulted 6/21 for UTI, sepsis. Reconsulted 6/24 for tachypnea, hypotension.   SIGNIFICANT EVENTS / STUDIES:  6/18 Cardiac cath>>> severe disease in 2 vein grafts, severe native CAD with occluded RCA< LAC and circumflex - bare metal stent to mid SVG  6/23 Ct abd/pelvis > severe diffuse colitis, cholelithiasis without cholecystitis  LINES / TUBES: PIV  CULTURES: BCx2 6/21>>> Urine 6/21>>> negative C diff 6/22 >> positive  ANTIBIOTICS: Rocephin 6/18>>> 6/22 Vanc 6/21>>> 6/22 Cefepime 6/21>>> 6/22 Dificid 6/22 >>   INTERVAL EVENTS: Pt initiated treatment for C diff colitis 6/22, pan colitis confirmed on CT abd 6/23. She has remained poorly responsive but does interact some on the day shift. 6/23 pm she had an apparent aspiration event with her meds. She went back into A fib, became tachypneic. CXR without significant infiltrates. Lasix given without change in her resp status. Her BP now has dropped to 53-97'Q systolic.    VITAL SIGNS: Temp:  [97.6 F (36.4 C)-98.8 F (37.1 C)] 98.5 F (36.9 C) (06/24 0030) Resp:  [24-50] 50 (06/24 0215) BP: (76-137)/(33-100) 77/56 mmHg (06/24 0215) SpO2:  [93 %-100 %] 97 % (06/24 0215) FiO2 (%):  [2 %] 2 % (06/23 2044) HEMODYNAMICS:   VENTILATOR SETTINGS: Vent Mode:  [-]  FiO2 (%):  [2 %] 2 % INTAKE / OUTPUT: Intake/Output     06/23 0701 - 06/24 0700    P.O. 120   I.V. (mL/kg) 825 (11.9)   Total Intake(mL/kg) 945 (13.6)   Urine (mL/kg/hr) 150 (0.1)   Total Output 150   Net +795        PHYSICAL EXAMINATION: General:  Chronically ill appearing elderly female, NAD  Neuro:  Awake, alert, russian speaking, MAE, does follow some commands HEENT:  Mm moist, no JVD Cardiovascular:  s1s2 rrr, now NSR Lungs:  resps even non labored on RA, mild tachypnea, diminished bases, no audible wheeze or crackle  Abdomen:  Soft, +bs Musculoskeletal:  Warm and dry, no edema   LABS:  CBC  Recent Labs Lab 10/25/13 0230 10/26/13 0410 10/27/13 0250  WBC 33.3* 35.4* 53.3*  HGB 9.4* 8.5* 9.4*  HCT 29.7* 26.2* 29.0*  PLT 267 238 339   Coag's  Recent Labs Lab 10/15/2013 0200  INR 1.10   BMET  Recent Labs Lab 10/25/13 0805 10/26/13 0410 10/27/13 0250  NA 140 135* 135*  K 3.5* 3.3* 4.1  CL 100 100 98  CO2 25 20 20   BUN 41* 40* 46*  CREATININE 1.60* 1.40* 1.43*  GLUCOSE 184* 170* 169*   Electrolytes  Recent Labs Lab 10/28/2013 0350  10/25/13 0805 10/26/13 0410 10/27/13 0250  CALCIUM  --   < > 8.7 7.9* 8.4  MG 2.2  --  2.3  --   --   < > = values in this interval not displayed. Sepsis Markers  Recent Labs Lab 10/24/13 1005 10/25/13 0805 10/25/13 1430 10/26/13 0410 10/27/13 0250 10/27/13 0930  LATICACIDVEN 2.1 1.5  --   --   --  2.7*  PROCALCITON  --   --  1.68 0.65 0.96  --    ABG  Recent Labs Lab 10/25/13 0810  PHART 7.451*  PCO2ART 36.7  PO2ART 84.6   Liver Enzymes  Recent Labs Lab 10/26/13 0410  AST 29  ALT 17  ALKPHOS 126*  BILITOT 0.2*  ALBUMIN 1.4*   Cardiac Enzymes  Recent Labs Lab 10/19/2013 0915 10/24/13 0315  TROPONINI 6.18*  --   PROBNP  --  4016.0*   Glucose  Recent Labs Lab 10/23/13 1421  GLUCAP 179*    Imaging Ct Abdomen Pelvis Wo Contrast  10/27/2013   CLINICAL DATA:  Abdominal pain. C difficile colitis. Rising elevated white blood count.  EXAM: CT ABDOMEN AND PELVIS WITHOUT  CONTRAST  TECHNIQUE: Multidetector CT imaging of the abdomen and pelvis was performed following the standard protocol without IV contrast.  COMPARISON:  CT scan dated 11/30/2008  FINDINGS: There is a small left pleural effusion there is focal bronchiectasis at the right lung base posterior medially. There is a tiny calcified granuloma in the lingula of the left upper lobe.  There is a new small pericardial effusion. Overall heart size is normal. There is moderate anasarca most prominent in the flanks and lateral aspects of the buttocks.  There are innumerable tiny stones in the nondistended gallbladder. No dilated bile ducts. Liver parenchyma is normal as is the spleen. There is diffuse pancreatic atrophy. There is an 11 mm subtle nodule in the right adrenal gland consistent with a small adenoma, unchanged. Kidneys are normal.  There is marked edema of the mucosa of the colon from the cecum through the rectum with a large amount of stool in the rectum which may represent a fecal impaction. There is a small amount of ascites as well as colonic soft tissue stranding. The colon is not distended to suggest toxic mega pole colon.  There are 2 fibroids in the uterus, 1 calcified. Ovaries are normal.  No free air in the abdomen. No acute osseous abnormality. Grade 1 spondylolisthesis of L4 on L5.  IMPRESSION: Severe diffuse colitis. Cholelithiasis. Large amount of stool in the rectum.   Electronically Signed   By: Rozetta Nunnery M.D.   On: 10/27/2013 13:45   US Renal  10/26/2013   CLINICAL DATA:  Urosepsis, renal insufficiency  EXAM: RENAL/URINARY TRACT ULTRASOUND COMPLETE  COMPARISON:  Ultrasound 07/22/2010  FINDINGS: Right Kidney:  Length: 9.8 cm. Echogenicity within normal limits. No mass or hydronephrosis visualized.  Left Kidney:  Length: 10.2 cm. Echogenicity within normal limits. No mass or hydronephrosis visualized.  Bladder:  Bladder collapsed from Foley catheter.  IMPRESSION: Normal kidneys.  No hydronephrosis.   No obstruction.   Electronically Signed   By: Suzy Bouchard M.D.   On: 10/26/2013 16:38   Dg Chest Port 1 View  10/28/2013   CLINICAL DATA:  "Possible aspiration" tachypnea.  EXAM: PORTABLE CHEST - 1 VIEW  COMPARISON:  10/24/2013  FINDINGS: Low lung volumes are present, causing crowding of the pulmonary vasculature. Medial upper lungs are obscured by the patient's chin and facial tissues which are hunched over the chest.  Prior CABG noted. Band of left infrahilar opacity favors atelectasis. Mild central airway thickening.  IMPRESSION: 1. No specific evidence of aspiration pneumonitis at this time. 2. Airway thickening is present, suggesting bronchitis or reactive airways disease. 3. Mild retrocardiac atelectasis. 4. Low lung volumes.   Electronically Signed   By: Franchot Mimes  Janeece Fitting M.D.   On: 10/28/2013 01:02   ASSESSMENT / PLAN:  PULMONARY Acute resp failure with tachypnea Possible aspiration event, no evidence of pneumonitis by CXR P:   - ABG now - suspect she will need resp support. BiPAP an option but I do not know if she will tolerate .  - will speak with family regarding possible MV  CARDIOVASCULAR Afib RVR  CAD  NSTEMI - s/p PCI 6/18 Chronic dCHF Shock, presumed septic shock due to C diff colitis P:  - Attempt IVF bolus to see if she will respond - likely will need central IV access for CVP's and pressors - Amiodarone PO ordered, will probably change back to IV as she is back in A fib - Asa, plavix  - Heparin gtt per cards   RENAL Hypokalemia - resolved Acute on chronic renal insufficiency - baseline Scr ~0.9-1.0 P:   - follow BMP - Replace electrolytes as indicated.  GASTROINTESTINAL No active issue  P:   - Po diet as tol   HEMATOLOGIC Leukocytosis  Anemia - mild  P:  - F/u cbc  - Heparin gtt  INFECTIOUS UTI (previous klebsiella UTI) - treated Sepsis  C. Diff colitis P:   - Dificid ordered - note ID plans to change to high dose vanco   ENDOCRINE No  active issue   P:   - Monitor glucose on chem   NEUROLOGIC Chronic pain  Psychosomatic do  Hx psychosis  P:   - careful with methadone and oversedation, may need to scale back   I have personally obtained a history, examined the patient, evaluated laboratory and imaging results, formulated the assessment and plan and placed orders.  Global > I spoke with the pt's daughter by phone, explained her clinical change and my concern that she is at risk for respiratory failure. I also explained that MV, pressors, CPR are options here but that I do not believe they will enhance her ability to survive this illness. Lenda Kelp understood this and has consulted with her father. They agree that Dr. Ronny Flurry should not undergo life support measures but should instead transition to comfort care should she decline. I supported this decision. DNR/I orders will be placed in the chart. All other medical care will be pursued.   55 minutes CC time  Baltazar Apo, MD, PhD 10/28/2013, 3:01 AM  Pulmonary and Critical Care [phone] or if no answer [phone]

## 2013-10-28 NOTE — Progress Notes (Signed)
Dr. Aundra Dubin with Cardiology paged regarding pt's CXR which showed no significant changes. Verbal orders given for Lasix 40mg  IV to see if that would help improve respiratory status. RN advise to notify Critical Care if pt has no change in status or becomes worse. Lasix given with no urine output. SBP dropped to 70-80s. RN called E-link MD for CCM consult. Now waiting for CCM MD to assess pt.

## 2013-10-28 NOTE — Progress Notes (Signed)
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 She was admitted two weeks ago with klebsiella (R Amp, R bactrim) UTI, confusion.  -Initially seen by Cardiology at that time for NSVT. Echo showed small wall motion abnormality but NSVT did not recur and there was no chest pain that was medically managed. She was treated for uti and discharged to SNF, but at the SNF had intermittent chest pain and syncope. She was admitted for evaluation and found to have NSTEMI on 6/17. She underwent PCI and had post procedure encephalopathy as well as leukocytosis, sepsis, c diff colitis, progressive declined, with septic shock  1. Septic shock, hypotension, tachycardia, tachypnea, c diff in the setting of recent NSTEMI, progressive renal failure, possible aspiration ? developing pnemonia   -clinically not improving; d/w patient, her husband at l;enght, they preferred full comfort care   2. Acute Kidney Injury, ATN, sepsis, cont comfort care  3. Afib with RVR in the setting of septic shock, NSTEMI -cont comfort care   4. Chronic Diastolic CHF worse with  -cont comfort care  5. Coronary Artery Disease  -Cardiac catheterization on 11/02/2013, undergoing bare metal stenting to mid SVG-diagonal and PTCA to SVG-RPDA.  -cont comfort care   Prognosis is very poor, ongoing sepsis, NSTEMI, AKI, CHF, hypotensive, encephalopathy -d/w patient, her husband at length, they preferred comfort care; started comfort meds   Code Status: DNR Family Communication: d/w patient, her husband  (indicate person spoken with, relationship, and if by phone, the number) Disposition Plan: hospcie   SIGNIFICANT EVENTS / STUDIES:  6/18 Cardiac cath>>> severe disease in 2 vein  grafts, severe native CAD with occluded RCA< LAC and circumflex - bare metal stent to mid SVG  6/23 Ct abd/pelvis > severe diffuse colitis, cholelithiasis without cholecystitis  LINES / TUBES:  PIV  CULTURES:  BCx2 6/21>>>  Urine 6/21>>> negative  C diff 6/22 >> positive  ANTIBIOTICS:  Rocephin 6/18>>> 6/22  Vanc 6/21>>> 6/22  Cefepime 6/21>>> 6/22  Dificid 6/22 >>   HPI/Subjective: confused  Objective: Filed Vitals:   10/28/13 0900  BP: 70/39  Pulse:   Temp:   Resp: 30    Intake/Output Summary (Last 24 hours) at 10/28/13 1125 Last data filed at 10/28/13 0900  Gross per 24 hour  Intake 1629.98 ml  Output    540 ml  Net 1089.98 ml   Filed Weights   10/17/2013 0700 10/25/2013 0332 10/25/13 0321  Weight: 66.5 kg (146 lb 9.7 oz) 66.4 kg (146 lb 6.2 oz) 69.4 kg (153 lb)    Exam:   General:  confused  Cardiovascular: s1,s2 rrr  Respiratory: poor ventilation BL  Abdomen: soft, mild tender, ND  Musculoskeletal: no LE edema   Data Reviewed: Basic Metabolic Panel:  Recent Labs Lab 10/24/13 0315 10/25/13 0805 10/26/13 0410 10/27/13 0250 10/28/13 0318  NA 137 140 135* 135* 134*  K 3.7 3.5* 3.3* 4.1 4.5  CL 95* 100 100 98 99  CO2 26 25 20 20  15*  GLUCOSE 154* 184* 170* 169* 170*  BUN 35* 41* 40* 46* 53*  CREATININE 2.09* 1.60* 1.40* 1.43* 1.78*  CALCIUM 8.6 8.7 7.9* 8.4 8.3*  MG  --  2.3  --   --   --    Liver  Function Tests:  Recent Labs Lab 10/26/13 0410  AST 29  ALT 17  ALKPHOS 126*  BILITOT 0.2*  PROT 4.9*  ALBUMIN 1.4*   No results found for this basename: LIPASE, AMYLASE,  in the last 168 hours No results found for this basename: AMMONIA,  in the last 168 hours CBC:  Recent Labs Lab 10/24/13 0315 10/25/13 0230 10/26/13 0410 10/27/13 0250 10/28/13 0318  WBC 25.2* 33.3* 35.4* 53.3* 62.1*  NEUTROABS  --   --   --  44.9*  --   HGB 9.3* 9.4* 8.5* 9.4* 10.3*  HCT 29.3* 29.7* 26.2* 29.0* 31.4*  MCV 95.1 96.1 95.3 94.2 95.4  PLT 268 267  238 339 390   Cardiac Enzymes: No results found for this basename: CKTOTAL, CKMB, CKMBINDEX, TROPONINI,  in the last 168 hours BNP (last 3 results)  Recent Labs  10/24/13 0315  PROBNP 4016.0*   CBG:  Recent Labs Lab 10/23/13 1421  GLUCAP 179*    Recent Results (from the past 240 hour(s))  MRSA PCR SCREENING     Status: None   Collection Time    10/07/2013  7:25 AM      Result Value Ref Range Status   MRSA by PCR NEGATIVE  NEGATIVE Final   Comment:            The GeneXpert MRSA Assay (FDA     approved for NASAL specimens     only), is one component of a     comprehensive MRSA colonization     surveillance program. It is not     intended to diagnose MRSA     infection nor to guide or     monitor treatment for     MRSA infections.  CULTURE, BLOOD (ROUTINE X 2)     Status: None   Collection Time    10/25/13  8:05 AM      Result Value Ref Range Status   Specimen Description BLOOD LEFT ARM   Final   Special Requests BOTTLES DRAWN AEROBIC AND ANAEROBIC 10CC   Final   Culture  Setup Time     Final   Value: 10/25/2013 15:00     Performed at [facility]   Culture     Final   Value:        BLOOD CULTURE RECEIVED NO GROWTH TO DATE CULTURE WILL BE HELD FOR 5 DAYS BEFORE ISSUING A FINAL NEGATIVE REPORT     Performed at [facility]   Report Status PENDING   Incomplete  CULTURE, BLOOD (ROUTINE X 2)     Status: None   Collection Time    10/25/13  8:09 AM      Result Value Ref Range Status   Specimen Description BLOOD RIGHT ARM   Final   Special Requests BOTTLES DRAWN AEROBIC AND ANAEROBIC 10CC   Final   Culture  Setup Time     Final   Value: 10/25/2013 15:01     Performed at [facility]   Culture     Final   Value:        BLOOD CULTURE RECEIVED NO GROWTH TO DATE CULTURE WILL BE HELD FOR 5 DAYS BEFORE ISSUING A FINAL NEGATIVE REPORT     Performed at [facility]   Report Status PENDING   Incomplete  URINE CULTURE     Status: None    Collection Time    10/25/13  3:32 PM      Result Value Ref  Range Status   Specimen Description URINE, CATHETERIZED   Final   Special Requests NONE   Final   Culture  Setup Time     Final   Value: 10/25/2013 22:51     Performed at [place]     Final   Value: NO GROWTH     Performed at [facility]   Culture     Final   Value: NO GROWTH     Performed at [facility]   Report Status 10/27/2013 FINAL   Final  CLOSTRIDIUM DIFFICILE BY PCR     Status: Abnormal   Collection Time    10/26/13  9:17 AM      Result Value Ref Range Status   C difficile by pcr POSITIVE (*) NEGATIVE Final   Comment: CRITICAL RESULT CALLED TO, READ BACK BY AND VERIFIED WITH:     Christain Sacramento RN 11:10 10/26/13 ([email or web address])     Studies: Ct Abdomen Pelvis Wo Contrast  10/27/2013   CLINICAL DATA:  Abdominal pain. C difficile colitis. Rising elevated white blood count.  EXAM: CT ABDOMEN AND PELVIS WITHOUT CONTRAST  TECHNIQUE: Multidetector CT imaging of the abdomen and pelvis was performed following the standard protocol without IV contrast.  COMPARISON:  CT scan dated 11/30/2008  FINDINGS: There is a small left pleural effusion there is focal bronchiectasis at the right lung base posterior medially. There is a tiny calcified granuloma in the lingula of the left upper lobe.  There is a new small pericardial effusion. Overall heart size is normal. There is moderate anasarca most prominent in the flanks and lateral aspects of the buttocks.  There are innumerable tiny stones in the nondistended gallbladder. No dilated bile ducts. Liver parenchyma is normal as is the spleen. There is diffuse pancreatic atrophy. There is an 11 mm subtle nodule in the right adrenal gland consistent with a small adenoma, unchanged. Kidneys are normal.  There is marked edema of the mucosa of the colon from the cecum through the rectum with a large amount of stool in the rectum which may represent a fecal impaction.  There is a small amount of ascites as well as colonic soft tissue stranding. The colon is not distended to suggest toxic mega pole colon.  There are 2 fibroids in the uterus, 1 calcified. Ovaries are normal.  No free air in the abdomen. No acute osseous abnormality. Grade 1 spondylolisthesis of L4 on L5.  IMPRESSION: Severe diffuse colitis. Cholelithiasis. Large amount of stool in the rectum.   Electronically Signed   By: Rozetta Nunnery M.D.   On: 10/27/2013 13:45   US Renal  10/26/2013   CLINICAL DATA:  Urosepsis, renal insufficiency  EXAM: RENAL/URINARY TRACT ULTRASOUND COMPLETE  COMPARISON:  Ultrasound 07/22/2010  FINDINGS: Right Kidney:  Length: 9.8 cm. Echogenicity within normal limits. No mass or hydronephrosis visualized.  Left Kidney:  Length: 10.2 cm. Echogenicity within normal limits. No mass or hydronephrosis visualized.  Bladder:  Bladder collapsed from Foley catheter.  IMPRESSION: Normal kidneys.  No hydronephrosis.  No obstruction.   Electronically Signed   By: Suzy Bouchard M.D.   On: 10/26/2013 16:38   Dg Chest Port 1 View  10/28/2013   CLINICAL DATA:  "Possible aspiration" tachypnea.  EXAM: PORTABLE CHEST - 1 VIEW  COMPARISON:  10/24/2013  FINDINGS: Low lung volumes are present, causing crowding of the pulmonary vasculature. Medial upper lungs are obscured by the patient's chin and facial tissues which are hunched  over the chest.  Prior CABG noted. Band of left infrahilar opacity favors atelectasis. Mild central airway thickening.  IMPRESSION: 1. No specific evidence of aspiration pneumonitis at this time. 2. Airway thickening is present, suggesting bronchitis or reactive airways disease. 3. Mild retrocardiac atelectasis. 4. Low lung volumes.   Electronically Signed   By: Sherryl Barters M.D.   On: 10/28/2013 01:02    Scheduled Meds:  Continuous Infusions: . morphine      Principal Problem:   NSTEMI (non-ST elevated myocardial infarction) Active Problems:   Syncope   Hx of CABG    Septic shock   C. difficile colitis    Time spent: >35 minutes     [name]  [facility] Pager [phone]. If 7PM-7AM, please contact night-coverage at [email or web address], password [facility] 10/28/2013, 11:25 AM  LOS: 7 days

## 2013-10-28 NOTE — Progress Notes (Signed)
Called patient's daughter Luna Fuse to update her on patient's status.  Lenda Kelp stated that she had talked to her dad after the doctor rounded and understood patient's declining condition.  This RN informed Lenda Kelp that Morphine has been ordered and will be started.  Lenda Kelp verbalized understanding and stated that she would be in to visit the patient around 4pm.

## 2013-10-28 NOTE — Progress Notes (Signed)
Pt is comfort care. We will sign off. Please call with questions.   MCALHANY,CHRISTOPHER 10/28/2013 1:01 PM

## 2013-10-28 NOTE — Progress Notes (Signed)
Speech Language Pathology  Patient Details Name: [name] MRN: [id number] DOB: 05/20/1927 Today's Date: 10/28/2013 Time:  -    RN reports pt. With minimal response and family to meet with MD this morning re: current condition.  SLP will defer tx and follow along for plan.   Orbie Pyo Cameron.[employer] [phone]  10/28/2013

## 2013-10-28 NOTE — Progress Notes (Signed)
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  Returned 6/17 with syncope and chest pain. Admitted by cardiology with NSTEMI with [facility] consult.  Underwent cardiac cath and PCI with BMS 6/18. PCCM consulted 6/21 for UTI, sepsis. Reconsulted 6/24 for tachypnea, hypotension.   SIGNIFICANT EVENTS / STUDIES:  6/18 Cardiac cath>>> severe disease in 2 vein grafts, severe native CAD with occluded RCA< LAC and circumflex - bare metal stent to mid SVG  6/23 Ct abd/pelvis > severe diffuse colitis, cholelithiasis without cholecystitis  LINES / TUBES: PIV  CULTURES: BCx2 6/21>>> Urine 6/21>>> negative C diff 6/22 >> positive  ANTIBIOTICS: Rocephin 6/18>>> 6/22 Vanc 6/21>>> 6/22 Cefepime 6/21>>> 6/22 Dificid 6/22 >>   INTERVAL EVENTS: Now unresponsive with severe upper airway sounds.   VITAL SIGNS: Temp:  [97.6 F (36.4 C)-98.8 F (37.1 C)] 97.7 F (36.5 C) (06/24 0750) Resp:  [24-50] 30 (06/24 0900) BP: (66-184)/(22-159) 70/39 mmHg (06/24 0900) SpO2:  [93 %-100 %] 98 % (06/24 0900) FiO2 (%):  [2 %] 2 % (06/23 2044) HEMODYNAMICS:   VENTILATOR SETTINGS: Vent Mode:  [-]  FiO2 (%):  [2 %] 2 % INTAKE / OUTPUT: Intake/Output     06/23 0701 - 06/24 0700 06/24 0701 - 06/25 0700   P.O. 120    I.V. (mL/kg) 1549 (22.3) 181 (2.6)   Total Intake(mL/kg) 1669 (24) 181 (2.6)   Urine (mL/kg/hr) 385 (0.2)    Total Output 385     Net +1284 +181         PHYSICAL EXAMINATION: General:  Chronically ill appearing elderly female, NAD  Neuro:  Not responsive HEENT:  Mm moist, no JVD Cardiovascular:   s1s2 rrr, now NSR Lungs: Labored respiration and transmitted upper airway sounds Abdomen:  Soft, +bs Musculoskeletal:  Warm and dry, no edema   LABS:  CBC  Recent Labs Lab 10/26/13 0410 10/27/13 0250 10/28/13 0318  WBC 35.4* 53.3* 62.1*  HGB 8.5* 9.4* 10.3*  HCT 26.2* 29.0* 31.4*  PLT 238 339 390   Coag's  Recent Labs Lab 10/19/2013 0200  INR 1.10   BMET  Recent Labs Lab 10/26/13 0410 10/27/13 0250 10/28/13 0318  NA 135* 135* 134*  K 3.3* 4.1 4.5  CL 100 98 99  CO2 20 20 15*  BUN 40* 46* 53*  CREATININE 1.40* 1.43* 1.78*  GLUCOSE 170* 169* 170*   Electrolytes  Recent Labs Lab 10/25/13 0805 10/26/13 0410 10/27/13 0250 10/28/13 0318  CALCIUM 8.7 7.9* 8.4 8.3*  MG 2.3  --   --   --    Sepsis Markers  Recent Labs Lab 10/25/13 0805 10/25/13 1430 10/26/13 0410 10/27/13 0250 10/27/13 0930 10/28/13 0320  LATICACIDVEN 1.5  --   --   --  2.7* 4.4*  PROCALCITON  --  1.68 0.65 0.96  --   --    ABG  Recent Labs Lab 10/25/13 0810 10/28/13 0317  PHART 7.451* 7.399  PCO2ART 36.7 23.2*  PO2ART 84.6 78.0*   Liver Enzymes  Recent Labs Lab 10/26/13 0410  AST 29  ALT 17  ALKPHOS 126*  BILITOT 0.2*  ALBUMIN 1.4*  Cardiac Enzymes  Recent Labs Lab 10/24/13 0315  PROBNP 4016.0*   Glucose  Recent Labs Lab 10/23/13 1421  GLUCAP 179*    Imaging Ct Abdomen Pelvis Wo Contrast  10/27/2013   CLINICAL DATA:  Abdominal pain. C difficile colitis. Rising elevated white blood count.  EXAM: CT ABDOMEN AND PELVIS WITHOUT CONTRAST  TECHNIQUE: Multidetector CT imaging of the abdomen and pelvis was performed following the standard protocol without IV contrast.  COMPARISON:  CT scan dated 11/30/2008  FINDINGS: There is a small left pleural effusion there is focal bronchiectasis at the right lung base posterior medially. There is a tiny calcified granuloma in the lingula of the left upper lobe.  There is a new small pericardial effusion. Overall heart size  is normal. There is moderate anasarca most prominent in the flanks and lateral aspects of the buttocks.  There are innumerable tiny stones in the nondistended gallbladder. No dilated bile ducts. Liver parenchyma is normal as is the spleen. There is diffuse pancreatic atrophy. There is an 11 mm subtle nodule in the right adrenal gland consistent with a small adenoma, unchanged. Kidneys are normal.  There is marked edema of the mucosa of the colon from the cecum through the rectum with a large amount of stool in the rectum which may represent a fecal impaction. There is a small amount of ascites as well as colonic soft tissue stranding. The colon is not distended to suggest toxic mega pole colon.  There are 2 fibroids in the uterus, 1 calcified. Ovaries are normal.  No free air in the abdomen. No acute osseous abnormality. Grade 1 spondylolisthesis of L4 on L5.  IMPRESSION: Severe diffuse colitis. Cholelithiasis. Large amount of stool in the rectum.   Electronically Signed   By: Rozetta Nunnery M.D.   On: 10/27/2013 13:45   US Renal  10/26/2013   CLINICAL DATA:  Urosepsis, renal insufficiency  EXAM: RENAL/URINARY TRACT ULTRASOUND COMPLETE  COMPARISON:  Ultrasound 07/22/2010  FINDINGS: Right Kidney:  Length: 9.8 cm. Echogenicity within normal limits. No mass or hydronephrosis visualized.  Left Kidney:  Length: 10.2 cm. Echogenicity within normal limits. No mass or hydronephrosis visualized.  Bladder:  Bladder collapsed from Foley catheter.  IMPRESSION: Normal kidneys.  No hydronephrosis.  No obstruction.   Electronically Signed   By: Suzy Bouchard M.D.   On: 10/26/2013 16:38   Dg Chest Port 1 View  10/28/2013   CLINICAL DATA:  "Possible aspiration" tachypnea.  EXAM: PORTABLE CHEST - 1 VIEW  COMPARISON:  10/24/2013  FINDINGS: Low lung volumes are present, causing crowding of the pulmonary vasculature. Medial upper lungs are obscured by the patient's chin and facial tissues which are hunched over the chest.  Prior  CABG noted. Band of left infrahilar opacity favors atelectasis. Mild central airway thickening.  IMPRESSION: 1. No specific evidence of aspiration pneumonitis at this time. 2. Airway thickening is present, suggesting bronchitis or reactive airways disease. 3. Mild retrocardiac atelectasis. 4. Low lung volumes.   Electronically Signed   By: Sherryl Barters M.D.   On: 10/28/2013 01:02   ASSESSMENT / PLAN:  PULMONARY Acute resp failure with tachypnea Possible aspiration event, no evidence of pneumonitis by CXR P:   - Recommend full comfort care at this point. - Supplemental O2 as needed.  CARDIOVASCULAR Afib RVR  CAD  NSTEMI - s/p PCI 6/18 Chronic dCHF Shock, presumed septic shock due to C diff colitis P:  - No pressors. - IVF as needed.  RENAL Hypokalemia - resolved Acute  on chronic renal insufficiency - baseline Scr ~0.9-1.0 P:   - Recommend no further blood draws at this point.  GASTROINTESTINAL No active issue  P:   - Comfort feeds.  HEMATOLOGIC Leukocytosis  Anemia - mild  P:  - Recommend no further blood draws at this point. - Heparin gtt.  INFECTIOUS UTI (previous klebsiella UTI) - treated Sepsis  C. Diff colitis P:   - Dificid ordered  ENDOCRINE No active issue   P:   - Monitor glucose on chem.  NEUROLOGIC Chronic pain  Psychosomatic do  Hx psychosis  P:   - Recommend morphine drip.  Recommend full comfort care at this point, recommend palliative care involvement.  PCCM will sign off, please call back if needed.  I have personally obtained a history, examined the patient, evaluated laboratory and imaging results, formulated the assessment and plan and placed orders.  Rush Farmer, M.D. [facility] Pulmonary/Critical Care Medicine. Pager: [phone]. After hours pager: [phone].

## 2013-10-28 NOTE — Progress Notes (Signed)
Dr. Lamonte Sakai at bedside to assess pt. Bolus 530mL given. ABG collected. Family contacted and updated. Pt code status change to DNR. RN will now focus on comfort measures for pt and family while continuing monitor pt's condition.

## 2013-10-28 NOTE — Progress Notes (Signed)
Husband and Daughter at bedside. Update given on pt's condition. RN assessed family's questions and needs. Daughter denied having any needs, but was tearful. Will continue to assess family's needs and provide comfort to them and patient.

## 2013-10-28 NOTE — Progress Notes (Signed)
Pt still tachypneic in the 40's, back in ST, Sats 96% [place] 2L. Auditory wheezing assessed. E-link RN suggested deep suctioning pt. No MD orders given. [facility] hospitalist on call paged. Stat portable CXR ordered. Cardiology also notified and agreed with [facility].

## 2013-10-28 NOTE — Progress Notes (Addendum)
[place] for Infectious Disease    Date of Admission:  10/07/2013   Total days of antibiotics 3        Day 3 dificid           ID: [name] is a 80 y.o. female with  Sepsis from severe cdifficile colitis  Principal Problem:   NSTEMI (non-ST elevated myocardial infarction) Active Problems:   Syncope   Hx of CABG   Septic shock   C. difficile colitis    Subjective: Having episodes of hypotension,  Less responsiveness, labored breathing  Medications:  . amiodarone  400 mg Oral BID  . aspirin  81 mg Oral Daily  . clopidogrel  75 mg Oral Q breakfast  . conjugated estrogens  1 g Vaginal Once per day on Mon Thu  . docusate  100 mg Oral BID  . FLUoxetine  10 mg Oral Daily  . pantoprazole sodium  40 mg Per Tube Daily  . sodium chloride  3 mL Intravenous Q12H  . Valproic Acid  500 mg Oral TID  . vancomycin  500 mg Oral 4 times per day    Objective: Vital signs in last 24 hours: Temp:  [97.6 F (36.4 C)-98.8 F (37.1 C)] 97.7 F (36.5 C) (06/24 0750) Resp:  [24-50] 30 (06/24 0900) BP: (66-184)/(22-159) 70/39 mmHg (06/24 0900) SpO2:  [93 %-100 %] 98 % (06/24 0900) FiO2 (%):  [2 %] 2 % (06/23 2044)   Did not examine since made comfort care  Lab Results  Recent Labs  10/27/13 0250 10/28/13 0318  WBC 53.3* 62.1*  HGB 9.4* 10.3*  HCT 29.0* 31.4*  NA 135* 134*  K 4.1 4.5  CL 98 99  CO2 20 15*  BUN 46* 53*  CREATININE 1.43* 1.78*   Liver Panel  Recent Labs  10/26/13 0410  PROT 4.9*  ALBUMIN 1.4*  AST 29  ALT 17  ALKPHOS 126*  BILITOT 0.2*    Microbiology: 6/22 cdiff positive Studies/Results: Ct Abdomen Pelvis Wo Contrast  10/27/2013   CLINICAL DATA:  Abdominal pain. C difficile colitis. Rising elevated white blood count.  EXAM: CT ABDOMEN AND PELVIS WITHOUT CONTRAST  TECHNIQUE: Multidetector CT imaging of the abdomen and pelvis was performed following the standard protocol without IV contrast.  COMPARISON:  CT scan dated 11/30/2008   FINDINGS: There is a small left pleural effusion there is focal bronchiectasis at the right lung base posterior medially. There is a tiny calcified granuloma in the lingula of the left upper lobe.  There is a new small pericardial effusion. Overall heart size is normal. There is moderate anasarca most prominent in the flanks and lateral aspects of the buttocks.  There are innumerable tiny stones in the nondistended gallbladder. No dilated bile ducts. Liver parenchyma is normal as is the spleen. There is diffuse pancreatic atrophy. There is an 11 mm subtle nodule in the right adrenal gland consistent with a small adenoma, unchanged. Kidneys are normal.  There is marked edema of the mucosa of the colon from the cecum through the rectum with a large amount of stool in the rectum which may represent a fecal impaction. There is a small amount of ascites as well as colonic soft tissue stranding. The colon is not distended to suggest toxic mega pole colon.  There are 2 fibroids in the uterus, 1 calcified. Ovaries are normal.  No free air in the abdomen. No acute osseous abnormality. Grade 1 spondylolisthesis of L4 on L5.  IMPRESSION:  Severe diffuse colitis. Cholelithiasis. Large amount of stool in the rectum.   Electronically Signed   By: Rozetta Nunnery M.D.   On: 10/27/2013 13:45   US Renal  10/26/2013   CLINICAL DATA:  Urosepsis, renal insufficiency  EXAM: RENAL/URINARY TRACT ULTRASOUND COMPLETE  COMPARISON:  Ultrasound 07/22/2010  FINDINGS: Right Kidney:  Length: 9.8 cm. Echogenicity within normal limits. No mass or hydronephrosis visualized.  Left Kidney:  Length: 10.2 cm. Echogenicity within normal limits. No mass or hydronephrosis visualized.  Bladder:  Bladder collapsed from Foley catheter.  IMPRESSION: Normal kidneys.  No hydronephrosis.  No obstruction.   Electronically Signed   By: Suzy Bouchard M.D.   On: 10/26/2013 16:38   Dg Chest Port 1 View  10/28/2013   CLINICAL DATA:  "Possible aspiration"  tachypnea.  EXAM: PORTABLE CHEST - 1 VIEW  COMPARISON:  10/24/2013  FINDINGS: Low lung volumes are present, causing crowding of the pulmonary vasculature. Medial upper lungs are obscured by the patient's chin and facial tissues which are hunched over the chest.  Prior CABG noted. Band of left infrahilar opacity favors atelectasis. Mild central airway thickening.  IMPRESSION: 1. No specific evidence of aspiration pneumonitis at this time. 2. Airway thickening is present, suggesting bronchitis or reactive airways disease. 3. Mild retrocardiac atelectasis. 4. Low lung volumes.   Electronically Signed   By: Sherryl Barters M.D.   On: 10/28/2013 01:02     Assessment/Plan: Sepsis from severe cdifficile colitis = will change to high dose oral vancomycin 500mg  QID and discontinue dificid. anticipate Poor outcome due to worsening lactic acidosis, hypotension from cdiff sepsis  SNIDER, [facility] for Infectious Diseases Cell: [phone] Pager: [phone]  10/28/2013, 10:10 AM

## 2013-10-29 ENCOUNTER — Other Ambulatory Visit: Payer: Self-pay | Admitting: Internal Medicine

## 2013-10-29 NOTE — Progress Notes (Signed)
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 She was admitted two weeks ago with klebsiella (R Amp, R bactrim) UTI, confusion.  -Initially seen by Cardiology at that time for NSVT. Echo showed small wall motion abnormality but NSVT did not recur and there was no chest pain that was medically managed. She was treated for uti and discharged to SNF, but at the SNF had intermittent chest pain and syncope. She was admitted for evaluation and found to have NSTEMI on 6/17. She underwent PCI and had post procedure encephalopathy as well as leukocytosis, sepsis, c diff colitis, progressive declined, with septic shock  1. Septic shock, hypotension, tachycardia, tachypnea, c diff in the setting of recent NSTEMI, progressive renal failure, possible aspiration ? developing pnemonia   -unresponsive, clinically not improving; d/w patient, her husband at lenght, they preferred full comfort care   2. Acute Kidney Injury, ATN, sepsis, cont comfort care  3. Afib with RVR in the setting of septic shock, NSTEMI -cont comfort care   4. Chronic Diastolic CHF worse with ongoing sepsis  -cont comfort care  5. Coronary Artery Disease  -Cardiac catheterization on 10/06/2013, undergoing bare metal stenting to mid SVG-diagonal and PTCA to SVG-RPDA.  -cont comfort care   Prognosis is very poor, ongoing sepsis, NSTEMI, AKI, CHF, hypotensive, encephalopathy -d/w patient, her husband at length, they preferred comfort care; cont comfort meds   Code Status: DNR Family Communication: d/w patient, her husband  (indicate person spoken with, relationship, and if by phone, the number) Disposition Plan: hospice   SIGNIFICANT EVENTS / STUDIES:  6/18 Cardiac cath>>>  severe disease in 2 vein grafts, severe native CAD with occluded RCA< LAC and circumflex - bare metal stent to mid SVG  6/23 Ct abd/pelvis > severe diffuse colitis, cholelithiasis without cholecystitis  LINES / TUBES:  PIV  CULTURES:  BCx2 6/21>>>  Urine 6/21>>> negative  C diff 6/22 >> positive  ANTIBIOTICS:  Rocephin 6/18>>> 6/22  Vanc 6/21>>> 6/22  Cefepime 6/21>>> 6/22  Dificid 6/22 >> 6/25  HPI/Subjective: Unresponsive   Objective: Filed Vitals:   10/29/13 0500  BP: 107/58  Pulse:   Temp: 97.3 F (36.3 C)  Resp: 20    Intake/Output Summary (Last 24 hours) at 10/29/13 0945 Last data filed at 10/29/13 0500  Gross per 24 hour  Intake   20.5 ml  Output    200 ml  Net -179.5 ml   Filed Weights   11/02/2013 0700 11/01/2013 0332 10/25/13 0321  Weight: 66.5 kg (146 lb 9.7 oz) 66.4 kg (146 lb 6.2 oz) 69.4 kg (153 lb)    Exam:   General: unresponsive   Cardiovascular: s1,s2 rrr  Respiratory: poor ventilation BL  Abdomen: soft, mild tender, ND  Musculoskeletal: no LE edema   Data Reviewed: Basic Metabolic Panel:  Recent Labs Lab 10/24/13 0315 10/25/13 0805 10/26/13 0410 10/27/13 0250 10/28/13 0318  NA 137 140 135* 135* 134*  K 3.7 3.5* 3.3* 4.1 4.5  CL 95* 100 100 98 99  CO2 26 25 20 20  15*  GLUCOSE 154* 184* 170* 169* 170*  BUN 35* 41* 40* 46* 53*  CREATININE 2.09* 1.60* 1.40* 1.43* 1.78*  CALCIUM 8.6 8.7 7.9* 8.4 8.3*  MG  --  2.3  --   --   --  Liver Function Tests:  Recent Labs Lab 10/26/13 0410  AST 29  ALT 17  ALKPHOS 126*  BILITOT 0.2*  PROT 4.9*  ALBUMIN 1.4*   No results found for this basename: LIPASE, AMYLASE,  in the last 168 hours No results found for this basename: AMMONIA,  in the last 168 hours CBC:  Recent Labs Lab 10/24/13 0315 10/25/13 0230 10/26/13 0410 10/27/13 0250 10/28/13 0318  WBC 25.2* 33.3* 35.4* 53.3* 62.1*  NEUTROABS  --   --   --  44.9*  --   HGB 9.3* 9.4* 8.5* 9.4* 10.3*  HCT 29.3* 29.7* 26.2*  29.0* 31.4*  MCV 95.1 96.1 95.3 94.2 95.4  PLT 268 267 238 339 390   Cardiac Enzymes: No results found for this basename: CKTOTAL, CKMB, CKMBINDEX, TROPONINI,  in the last 168 hours BNP (last 3 results)  Recent Labs  10/24/13 0315  PROBNP 4016.0*   CBG:  Recent Labs Lab 10/23/13 1421  GLUCAP 179*    Recent Results (from the past 240 hour(s))  MRSA PCR SCREENING     Status: None   Collection Time    10/27/2013  7:25 AM      Result Value Ref Range Status   MRSA by PCR NEGATIVE  NEGATIVE Final   Comment:            The GeneXpert MRSA Assay (FDA     approved for NASAL specimens     only), is one component of a     comprehensive MRSA colonization     surveillance program. It is not     intended to diagnose MRSA     infection nor to guide or     monitor treatment for     MRSA infections.  CULTURE, BLOOD (ROUTINE X 2)     Status: None   Collection Time    10/25/13  8:05 AM      Result Value Ref Range Status   Specimen Description BLOOD LEFT ARM   Final   Special Requests BOTTLES DRAWN AEROBIC AND ANAEROBIC 10CC   Final   Culture  Setup Time     Final   Value: 10/25/2013 15:00     Performed at [facility]   Culture     Final   Value:        BLOOD CULTURE RECEIVED NO GROWTH TO DATE CULTURE WILL BE HELD FOR 5 DAYS BEFORE ISSUING A FINAL NEGATIVE REPORT     Performed at [facility]   Report Status PENDING   Incomplete  CULTURE, BLOOD (ROUTINE X 2)     Status: None   Collection Time    10/25/13  8:09 AM      Result Value Ref Range Status   Specimen Description BLOOD RIGHT ARM   Final   Special Requests BOTTLES DRAWN AEROBIC AND ANAEROBIC 10CC   Final   Culture  Setup Time     Final   Value: 10/25/2013 15:01     Performed at [facility]   Culture     Final   Value:        BLOOD CULTURE RECEIVED NO GROWTH TO DATE CULTURE WILL BE HELD FOR 5 DAYS BEFORE ISSUING A FINAL NEGATIVE REPORT     Performed at [facility]   Report Status PENDING    Incomplete  URINE CULTURE     Status: None   Collection Time    10/25/13  3:32 PM      Result Value  Ref Range Status   Specimen Description URINE, CATHETERIZED   Final   Special Requests NONE   Final   Culture  Setup Time     Final   Value: 10/25/2013 22:51     Performed at [place]     Final   Value: NO GROWTH     Performed at [facility]   Culture     Final   Value: NO GROWTH     Performed at [facility]   Report Status 10/27/2013 FINAL   Final  CLOSTRIDIUM DIFFICILE BY PCR     Status: Abnormal   Collection Time    10/26/13  9:17 AM      Result Value Ref Range Status   C difficile by pcr POSITIVE (*) NEGATIVE Final   Comment: CRITICAL RESULT CALLED TO, READ BACK BY AND VERIFIED WITH:     Christain Sacramento RN 11:10 10/26/13 ([email or web address])     Studies: Ct Abdomen Pelvis Wo Contrast  10/27/2013   CLINICAL DATA:  Abdominal pain. C difficile colitis. Rising elevated white blood count.  EXAM: CT ABDOMEN AND PELVIS WITHOUT CONTRAST  TECHNIQUE: Multidetector CT imaging of the abdomen and pelvis was performed following the standard protocol without IV contrast.  COMPARISON:  CT scan dated 11/30/2008  FINDINGS: There is a small left pleural effusion there is focal bronchiectasis at the right lung base posterior medially. There is a tiny calcified granuloma in the lingula of the left upper lobe.  There is a new small pericardial effusion. Overall heart size is normal. There is moderate anasarca most prominent in the flanks and lateral aspects of the buttocks.  There are innumerable tiny stones in the nondistended gallbladder. No dilated bile ducts. Liver parenchyma is normal as is the spleen. There is diffuse pancreatic atrophy. There is an 11 mm subtle nodule in the right adrenal gland consistent with a small adenoma, unchanged. Kidneys are normal.  There is marked edema of the mucosa of the colon from the cecum through the rectum with a large amount of stool in the  rectum which may represent a fecal impaction. There is a small amount of ascites as well as colonic soft tissue stranding. The colon is not distended to suggest toxic mega pole colon.  There are 2 fibroids in the uterus, 1 calcified. Ovaries are normal.  No free air in the abdomen. No acute osseous abnormality. Grade 1 spondylolisthesis of L4 on L5.  IMPRESSION: Severe diffuse colitis. Cholelithiasis. Large amount of stool in the rectum.   Electronically Signed   By: Rozetta Nunnery M.D.   On: 10/27/2013 13:45   Dg Chest Port 1 View  10/28/2013   CLINICAL DATA:  "Possible aspiration" tachypnea.  EXAM: PORTABLE CHEST - 1 VIEW  COMPARISON:  10/24/2013  FINDINGS: Low lung volumes are present, causing crowding of the pulmonary vasculature. Medial upper lungs are obscured by the patient's chin and facial tissues which are hunched over the chest.  Prior CABG noted. Band of left infrahilar opacity favors atelectasis. Mild central airway thickening.  IMPRESSION: 1. No specific evidence of aspiration pneumonitis at this time. 2. Airway thickening is present, suggesting bronchitis or reactive airways disease. 3. Mild retrocardiac atelectasis. 4. Low lung volumes.   Electronically Signed   By: Sherryl Barters M.D.   On: 10/28/2013 01:02    Scheduled Meds:  Continuous Infusions: . morphine 1 mg/hr (10/28/13 1130)    Principal Problem:   NSTEMI (non-ST elevated myocardial infarction)  Active Problems:   Syncope   Hx of CABG   Septic shock   C. difficile colitis    Time spent: >35 minutes     Kinnie Feil  [facility] Pager [phone]. If 7PM-7AM, please contact night-coverage at [email or web address], password [facility] 10/29/2013, 9:45 AM  LOS: 8 days

## 2013-10-29 NOTE — Progress Notes (Signed)
Pt transferred to 6N. This CSW gave unit CSW a handoff. This CSW signing off.   Ky Barban, MSW, [place] Clinical Social Worker

## 2013-10-29 NOTE — Plan of Care (Signed)
Problem: Phase II Progression Outcomes Goal: Discharge plan established Outcome: Progressing Patient is now palliative care

## 2013-10-29 NOTE — Progress Notes (Signed)
Nutrition Brief Note  Patient identified on malnutrition screening tool.  Patient's care plan is full comfort at this time. Prognosis is very poor.   Diet order is Dysphagia 1-thin liquids. Per RN, patient is non-responsive and not eating.   No nutrition interventions warranted at this time.  Please consult as needed.   Larey Seat, RD, LDN Pager #: [phone] After-Hours Pager #: [phone]

## 2013-10-30 DIAGNOSIS — J690 Pneumonitis due to inhalation of food and vomit: Secondary | ICD-10-CM

## 2013-10-30 NOTE — Progress Notes (Signed)
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 She was admitted two weeks ago with klebsiella (R Amp, R bactrim) UTI, confusion.  -Initially seen by Cardiology at that time for NSVT. Echo showed small wall motion abnormality but NSVT did not recur and there was no chest pain that was medically managed. She was treated for uti and discharged to SNF, but at the SNF had intermittent chest pain and syncope. She was admitted for evaluation and found to have NSTEMI on 6/17. She underwent PCI and had post procedure encephalopathy as well as leukocytosis, sepsis, c diff colitis, progressive declined, with septic shock  1. Septic shock, hypotension, tachycardia, tachypnea, c diff in the setting of recent NSTEMI, progressive renal failure, possible aspiration ? developing pnemonia   -unresponsive, d/w patient, her husband at lenght, they preferred full comfort care   2. Acute Kidney Injury, ATN, sepsis, cont comfort care  3. Afib with RVR in the setting of septic shock, NSTEMI -cont comfort care   4. Chronic Diastolic CHF worse with ongoing sepsis  -cont comfort care  5. Coronary Artery Disease  -Cardiac catheterization on 11/01/2013, undergoing bare metal stenting to mid SVG-diagonal and PTCA to SVG-RPDA.  -cont comfort care   Prognosis is very poor, ongoing sepsis, NSTEMI, AKI, CHF, hypotensive, encephalopathy -d/w patient, her husband at length, they preferred comfort care; cont comfort meds   Code Status: DNR Family Communication: d/w patient, her husband  (indicate person spoken with, relationship, and if by phone, the number) Disposition Plan: comfort care    SIGNIFICANT EVENTS / STUDIES:  6/18 Cardiac cath>>> severe disease in 2  vein grafts, severe native CAD with occluded RCA< LAC and circumflex - bare metal stent to mid SVG  6/23 Ct abd/pelvis > severe diffuse colitis, cholelithiasis without cholecystitis  LINES / TUBES:  PIV  CULTURES:  BCx2 6/21>>>  Urine 6/21>>> negative  C diff 6/22 >> positive  ANTIBIOTICS:  Rocephin 6/18>>> 6/22  Vanc 6/21>>> 6/22  Cefepime 6/21>>> 6/22  Dificid 6/22 >> 6/25  HPI/Subjective: Unresponsive   Objective: Filed Vitals:   10/30/13 0500  BP: 110/48  Pulse: 106  Temp: 98.6 F (37 C)  Resp: 22    Intake/Output Summary (Last 24 hours) at 10/30/13 0936 Last data filed at 10/30/13 0600  Gross per 24 hour  Intake      0 ml  Output    400 ml  Net   -400 ml   Filed Weights   10/15/2013 0332 10/25/13 0321 10/30/13 0500  Weight: 66.4 kg (146 lb 6.2 oz) 69.4 kg (153 lb) 76.2 kg (167 lb 15.9 oz)    Exam:   General: unresponsive   Cardiovascular: s1,s2 rrr  Respiratory: poor ventilation BL  Abdomen: soft, mild tender, ND  Musculoskeletal: no LE edema   Data Reviewed: Basic Metabolic Panel:  Recent Labs Lab 10/24/13 0315 10/25/13 0805 10/26/13 0410 10/27/13 0250 10/28/13 0318  NA 137 140 135* 135* 134*  K 3.7 3.5* 3.3* 4.1 4.5  CL 95* 100 100 98 99  CO2 26 25 20 20  15*  GLUCOSE 154* 184* 170* 169* 170*  BUN 35* 41* 40* 46* 53*  CREATININE 2.09* 1.60* 1.40* 1.43* 1.78*  CALCIUM 8.6 8.7 7.9* 8.4 8.3*  MG  --  2.3  --   --   --    Liver Function Tests:  Recent Labs Lab 10/26/13 0410  AST 29  ALT 17  ALKPHOS 126*  BILITOT 0.2*  PROT 4.9*  ALBUMIN 1.4*   No results found for this basename: LIPASE, AMYLASE,  in the last 168 hours No results found for this basename: AMMONIA,  in the last 168 hours CBC:  Recent Labs Lab 10/24/13 0315 10/25/13 0230 10/26/13 0410 10/27/13 0250 10/28/13 0318  WBC 25.2* 33.3* 35.4* 53.3* 62.1*  NEUTROABS  --   --   --  44.9*  --   HGB 9.3* 9.4* 8.5* 9.4* 10.3*  HCT 29.3* 29.7* 26.2* 29.0* 31.4*  MCV  95.1 96.1 95.3 94.2 95.4  PLT 268 267 238 339 390   Cardiac Enzymes: No results found for this basename: CKTOTAL, CKMB, CKMBINDEX, TROPONINI,  in the last 168 hours BNP (last 3 results)  Recent Labs  10/24/13 0315  PROBNP 4016.0*   CBG:  Recent Labs Lab 10/23/13 1421  GLUCAP 179*    Recent Results (from the past 240 hour(s))  MRSA PCR SCREENING     Status: None   Collection Time    10/28/2013  7:25 AM      Result Value Ref Range Status   MRSA by PCR NEGATIVE  NEGATIVE Final   Comment:            The GeneXpert MRSA Assay (FDA     approved for NASAL specimens     only), is one component of a     comprehensive MRSA colonization     surveillance program. It is not     intended to diagnose MRSA     infection nor to guide or     monitor treatment for     MRSA infections.  CULTURE, BLOOD (ROUTINE X 2)     Status: None   Collection Time    10/25/13  8:05 AM      Result Value Ref Range Status   Specimen Description BLOOD LEFT ARM   Final   Special Requests BOTTLES DRAWN AEROBIC AND ANAEROBIC 10CC   Final   Culture  Setup Time     Final   Value: 10/25/2013 15:00     Performed at [facility]   Culture     Final   Value:        BLOOD CULTURE RECEIVED NO GROWTH TO DATE CULTURE WILL BE HELD FOR 5 DAYS BEFORE ISSUING A FINAL NEGATIVE REPORT     Performed at [facility]   Report Status PENDING   Incomplete  CULTURE, BLOOD (ROUTINE X 2)     Status: None   Collection Time    10/25/13  8:09 AM      Result Value Ref Range Status   Specimen Description BLOOD RIGHT ARM   Final   Special Requests BOTTLES DRAWN AEROBIC AND ANAEROBIC 10CC   Final   Culture  Setup Time     Final   Value: 10/25/2013 15:01     Performed at [facility]   Culture     Final   Value:        BLOOD CULTURE RECEIVED NO GROWTH TO DATE CULTURE WILL BE HELD FOR 5 DAYS BEFORE ISSUING A FINAL NEGATIVE REPORT     Performed at [facility]   Report Status PENDING   Incomplete   URINE CULTURE     Status: None   Collection Time  10/25/13  3:32 PM      Result Value Ref Range Status   Specimen Description URINE, CATHETERIZED   Final   Special Requests NONE   Final   Culture  Setup Time     Final   Value: 10/25/2013 22:51     Performed at [place]     Final   Value: NO GROWTH     Performed at [facility]   Culture     Final   Value: NO GROWTH     Performed at [facility]   Report Status 10/27/2013 FINAL   Final  CLOSTRIDIUM DIFFICILE BY PCR     Status: Abnormal   Collection Time    10/26/13  9:17 AM      Result Value Ref Range Status   C difficile by pcr POSITIVE (*) NEGATIVE Final   Comment: CRITICAL RESULT CALLED TO, READ BACK BY AND VERIFIED WITH:     Christain [place] RN 11:10 10/26/13 ([email or web address])     Studies: No results found.  Scheduled Meds:  Continuous Infusions: . morphine 1 mg/hr (10/28/13 1130)    Principal Problem:   NSTEMI (non-ST elevated myocardial infarction) Active Problems:   Syncope   Hx of CABG   Septic shock   C. difficile colitis    Time spent: >35 minutes     Kinnie Feil  [facility] Pager [phone]. If 7PM-7AM, please contact night-coverage at [email or web address], password [facility] 10/30/2013, 9:36 AM  LOS: 9 days

## 2013-10-31 LAB — CULTURE, BLOOD (ROUTINE X 2)
Culture: NO GROWTH
Culture: NO GROWTH

## 2013-11-03 ENCOUNTER — Ambulatory Visit: Payer: Medicare Other | Admitting: Internal Medicine

## 2013-11-04 NOTE — Progress Notes (Signed)
[place]  [facility]or Service no. [phone].

## 2013-11-04 NOTE — Discharge Summary (Addendum)
Death Summary  [name] TJQ:[id number] DOB: Feb 12, 1928 DOA: 2013/11/04  PCP: Walker Kehr, MD PCP/Office notified: epic  Admit date: 11/04/13 Date of Death: Nov 14, 2013  Final Diagnoses:  Principal Problem:   NSTEMI (non-ST elevated myocardial infarction) Active Problems:   Syncope   Hx of CABG   Septic shock   C. 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 She was admitted two weeks ago with klebsiella (R Amp, R bactrim) UTI, confusion.  -Initially seen by Cardiology at that time for NSVT. Echo showed small wall motion abnormality but NSVT did not recur and there was no chest pain that was medically managed. She was treated for uti and discharged to SNF, but at the SNF had intermittent chest pain and syncope. She was admitted for evaluation and found to have NSTEMI on 11-05-2022. She underwent PCI and had post procedure encephalopathy as well as leukocytosis, sepsis, c diff colitis, progressive declined, with septic shock     Hospital Course:  1. Septic shock, hypotension, tachycardia, tachypnea, c diff in the setting of recent NSTEMI, progressive renal failure, possible aspiration ? developing pnemonia  -unresponsive, d/w patient, her husband at lenght, they preferred full comfort care  2. Acute Kidney Injury, ATN, sepsis, comfort care  3. Afib with RVR in the setting of septic shock, NSTEMI  -comfort care  4. Chronic Diastolic CHF worse with ongoing sepsis  -comfort care  5. Coronary Artery Disease  -Cardiac catheterization on 10/05/2013, undergoing bare metal stenting to mid SVG-diagonal and PTCA to SVG-RPDA.  -comfort care   Prognosis was very poor, ongoing sepsis, NSTEMI, AKI, CHF, hypotensive, encephalopathy  -d/w patient, her husband at length, they preferred comfort care;   Pt died  on 14-Nov-2013 at 9.20 AM; husband at the bedside       Signed:  Kinnie Feil  [facility] Nov 14, 2013, 10:45 AM    Addendum:  Pt likely had aspiration pneumonia, with acute hypoxic respiratory failure;    ulugbek buriev

## 2013-11-04 NOTE — Progress Notes (Signed)
Patient confirmed by me and Charlies Silvers at 0920 to have no pulse, no respiration  ,and pupils dilated.  Husband at the bedside.Dr. Kathryne Eriksson notified.

## 2013-11-04 DEATH — deceased

## 2013-11-12 ENCOUNTER — Encounter: Payer: Medicare Other | Admitting: Cardiology

## 2014-04-15 ENCOUNTER — Encounter (HOSPITAL_COMMUNITY): Payer: Self-pay | Admitting: Cardiology

## 2014-09-15 ENCOUNTER — Ambulatory Visit: Payer: Medicare Other | Admitting: Hematology and Oncology

## 2014-09-15 ENCOUNTER — Other Ambulatory Visit: Payer: Medicare Other

## 2015-01-31 IMAGING — CT CT HEAD W/O CM
1 series · 15 of 30 positions shown, 19 images · non-contrast
Comparison: CT of the head October 09, 2013 and MRI of the brain [place]

CLINICAL DATA: Syncopal episode, temporarily nonresponsive. History
of left internal carotid artery occlusion.

EXAM:
CT HEAD WITHOUT CONTRAST
TECHNIQUE: Contiguous axial images were obtained from the base of the skull
through the vertex without intravenous contrast.

[Series 2: head 5.0 h30s · axial · 0.45mm/px · z∈[-120,+15]mm · 15 of 31 slices shown, 19 images]
[im 2/31  brain]
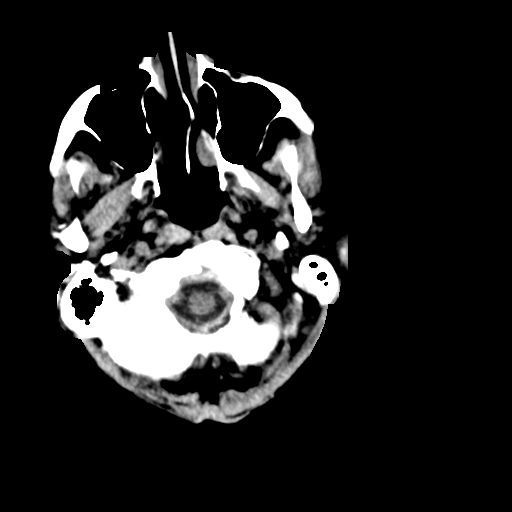
[im 2/31  bone]
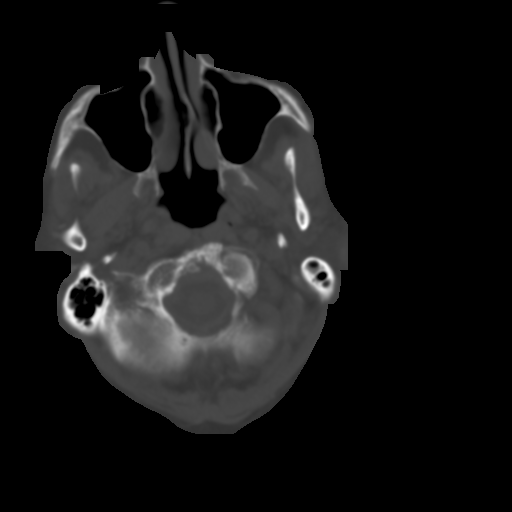
[im 4/31  brain]
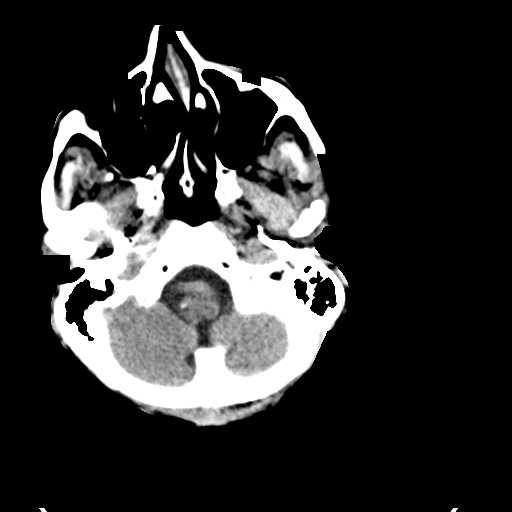
[im 6/31  brain]
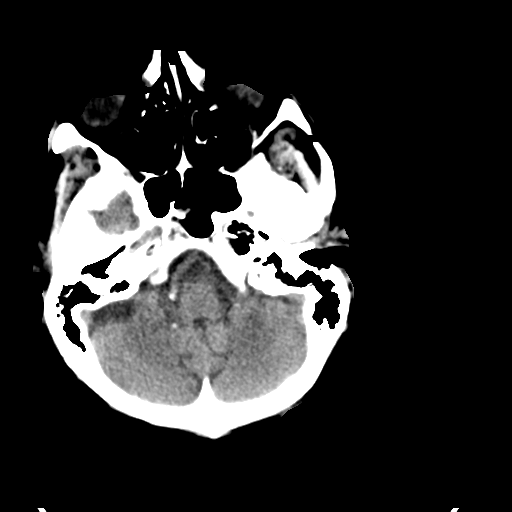
[im 8/31  brain]
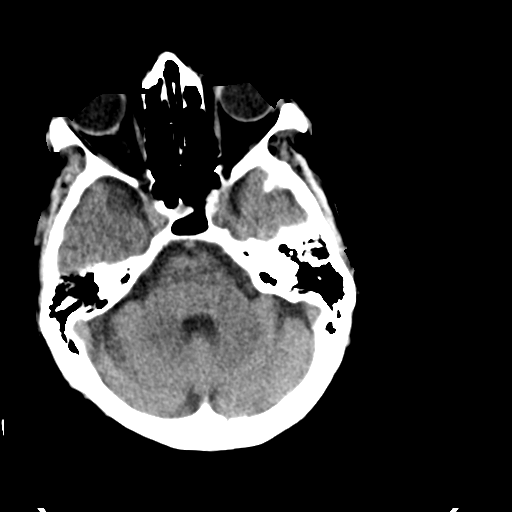
[im 10/31  brain]
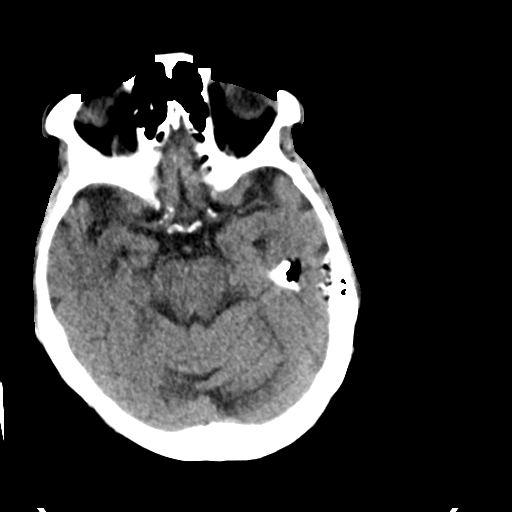
[im 10/31  bone]
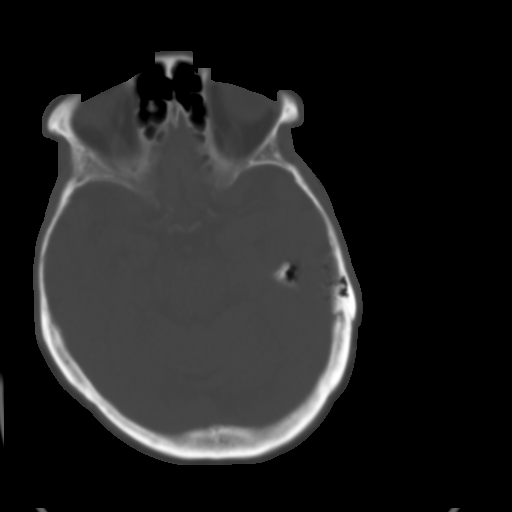
[im 12/31  brain]
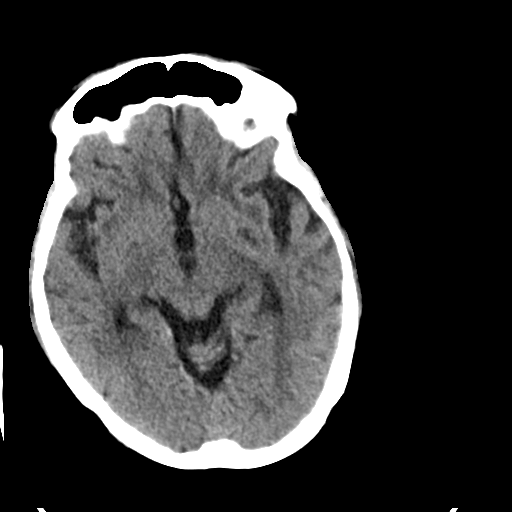
[im 14/31  brain]
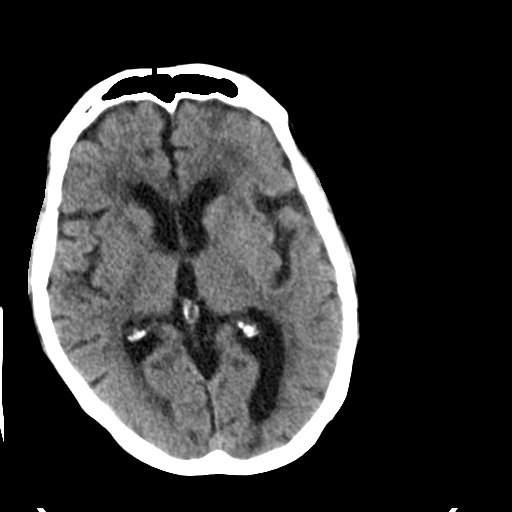
[im 16/31  brain]
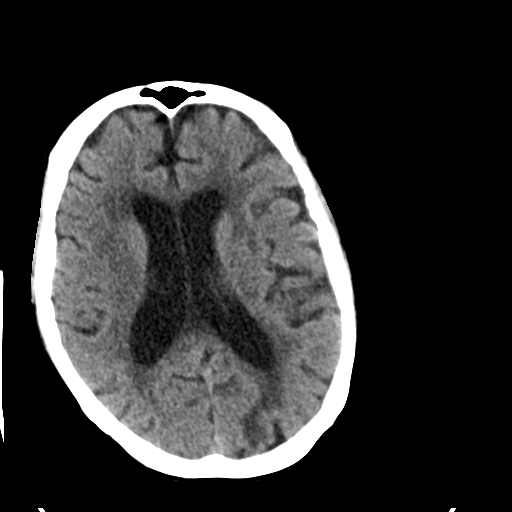
[im 17/31  brain]
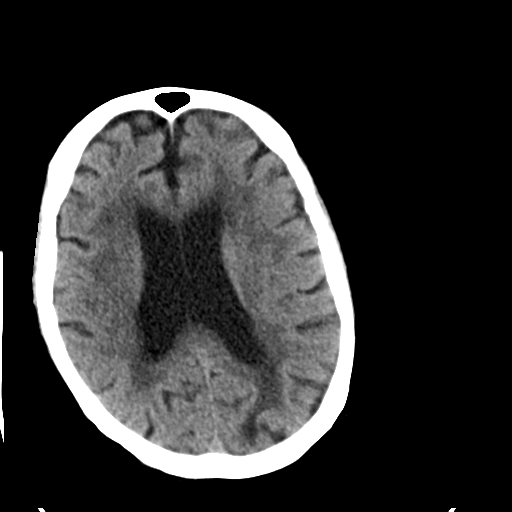
[im 17/31  bone]
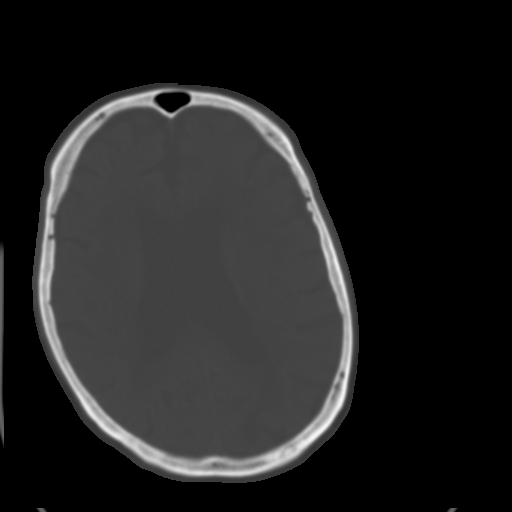
[im 19/31  brain]
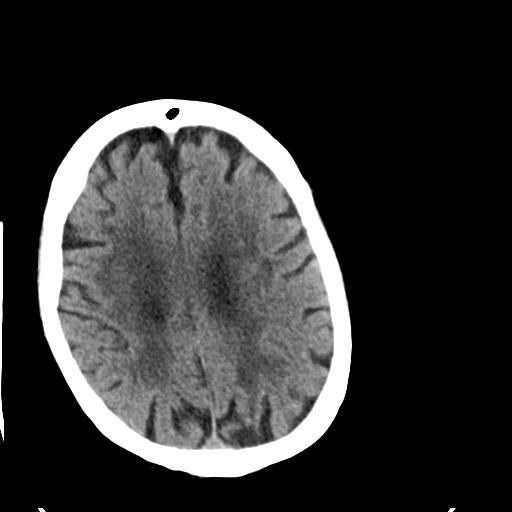
[im 21/31  brain]
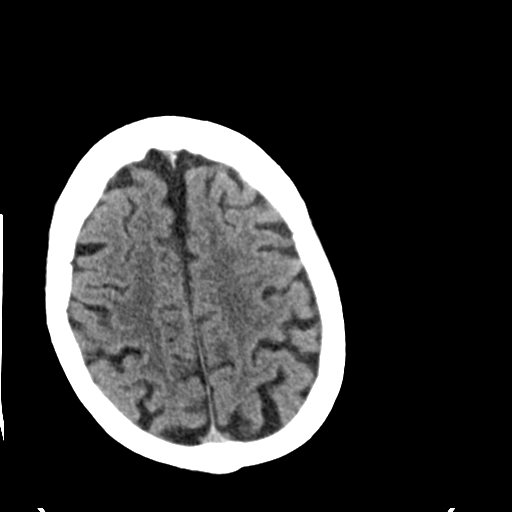
[im 23/31  brain]
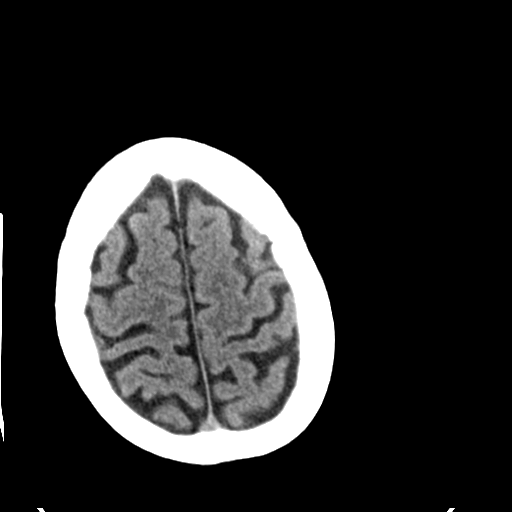
[im 25/31  brain]
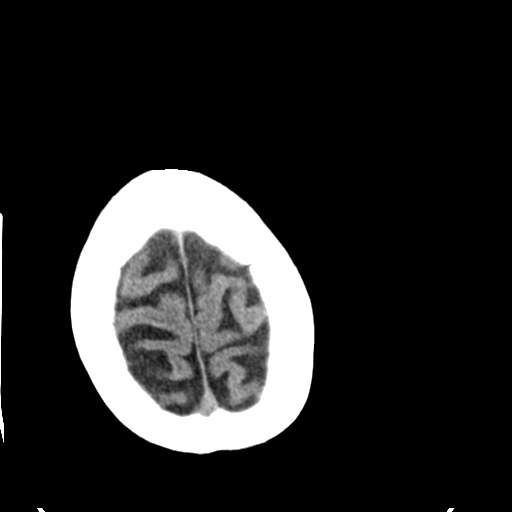
[im 25/31  bone]
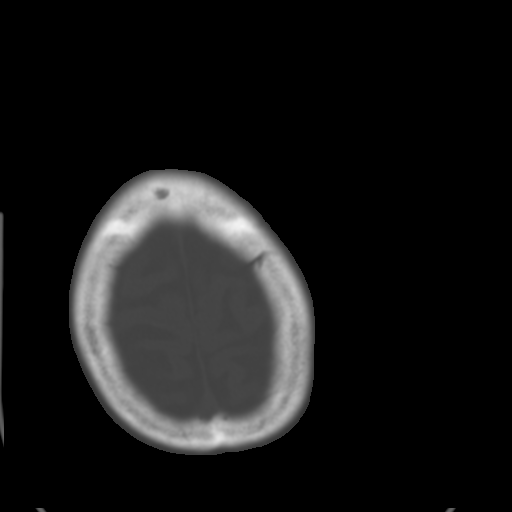
[im 27/31  brain]
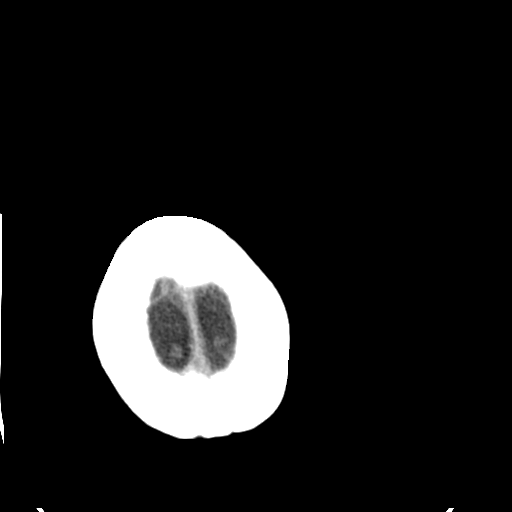
[im 29/31  brain]
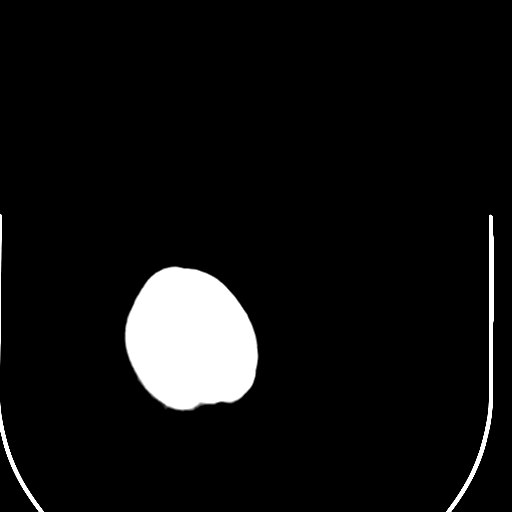

[15 of 30 positions shown; findings below may reference images not displayed]

FINDINGS: The ventricles and sulci are normal for age. No intraparenchymal
hemorrhage, mass effect nor midline shift. Confluent supratentorial
white matter hypodensities are within normal range for patient's age
and though non-specific suggest sequelae of chronic small vessel
ischemic disease. No acute large vascular territory infarcts. Remote
left occipital transcortical infarct with ex vacuo dilatation of the
left occipital horn.

No abnormal extra-axial fluid collections. Basal cisterns are
patent. Mild calcific atherosclerosis of the carotid siphons.

No skull fracture. The included ocular globes and orbital contents
are non-suspicious. Bilateral ocular lens implants. Small left
sphenoid mucosal retention cysts without paranasal sinus air-fluid
levels. Partially imaged severe C1-2 osteoarthrosis. Severe right,
moderate left temporomandibular osteoarthrosis.
IMPRESSION: No acute intracranial process.

Remote left posterior cerebral artery territory infarct with
moderate to severe white matter changes suggesting chronic small
vessel ischemic disease.

  By: Angi Billiot

## 2015-02-05 IMAGING — US US RENAL
1 series · 14 of 25 positions shown · non-contrast
Comparison: Ultrasound 07/22/2010

CLINICAL DATA: Urosepsis, renal insufficiency

EXAM:
RENAL/URINARY TRACT ULTRASOUND COMPLETE

[Series 1: us renal · 0.22mm/px · 14 of 27 slices shown]
[im 1/27]
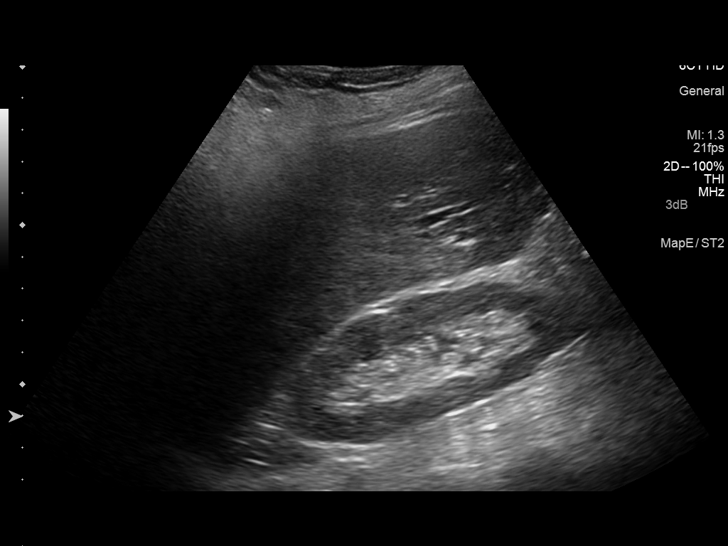
[im 3/27]
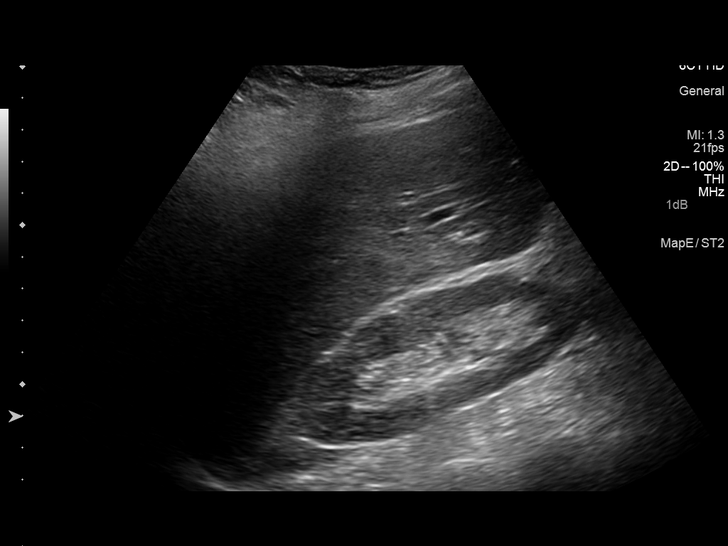
[im 5/27]
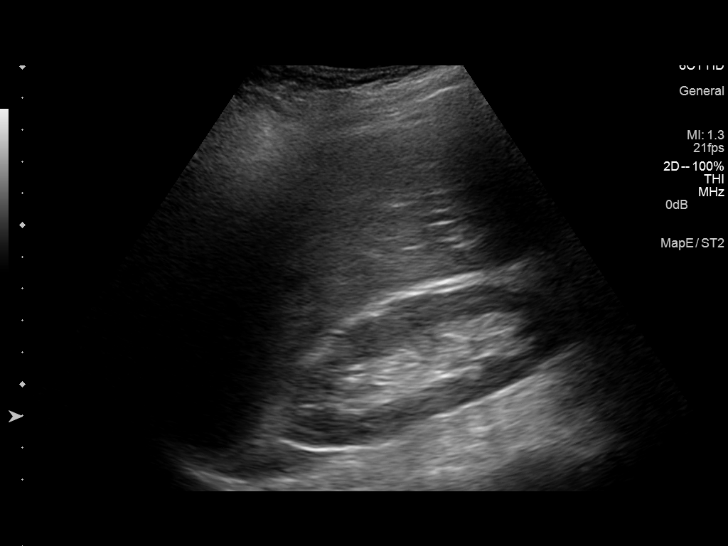
[im 7/27]
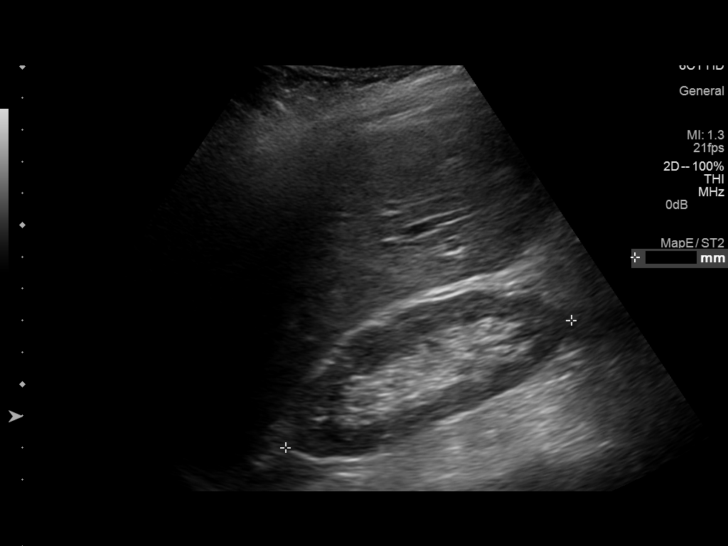
[im 9/27]
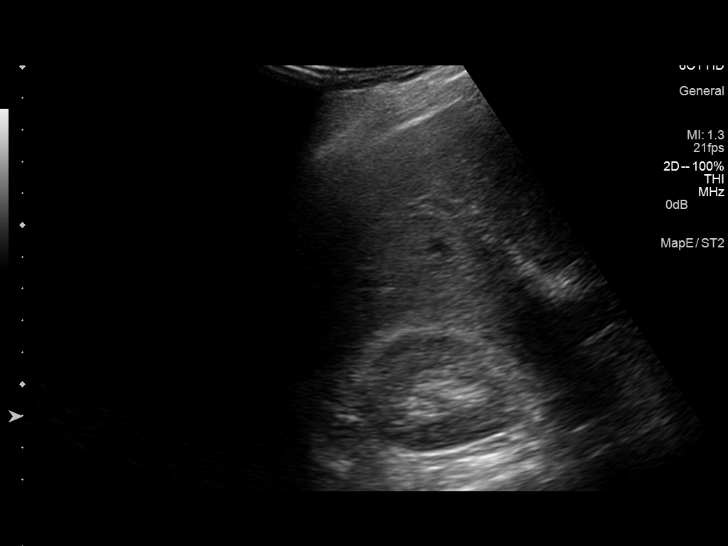
[im 10/27]
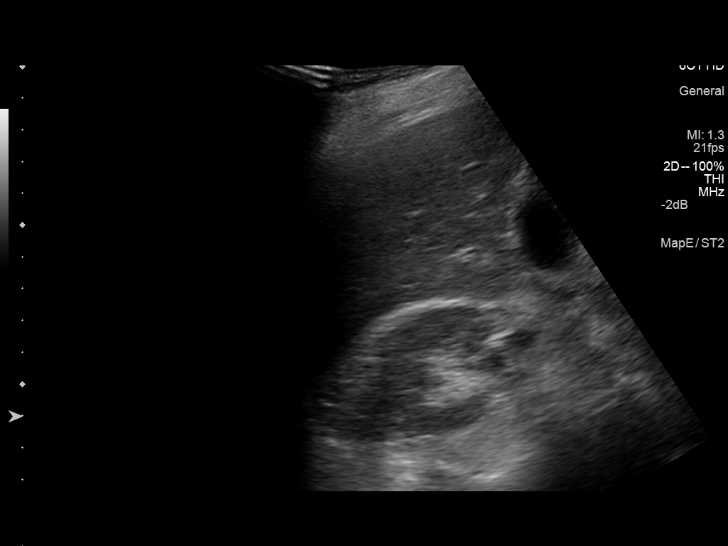
[im 12/27]
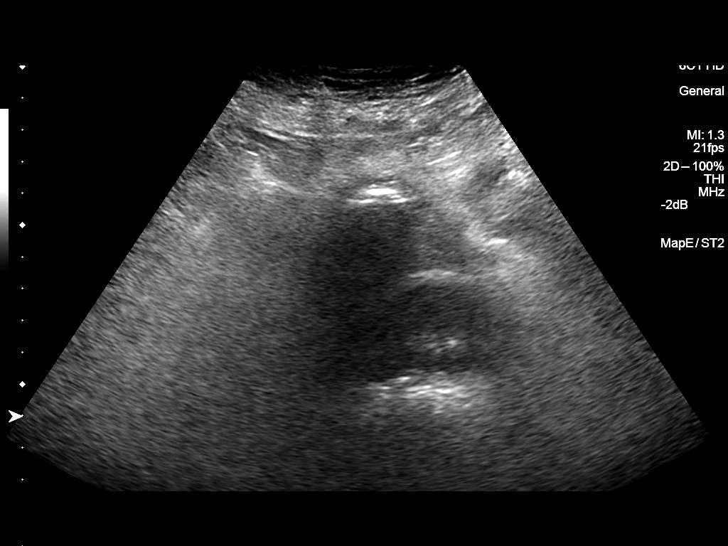
[im 15/27]
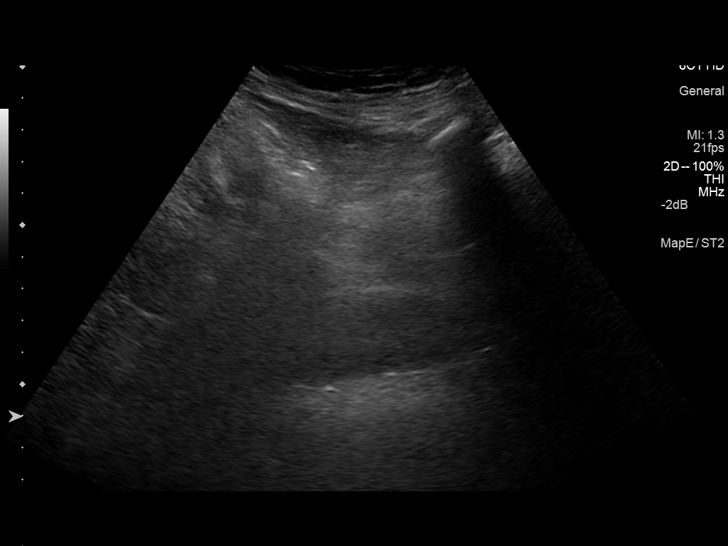
[im 17/27]
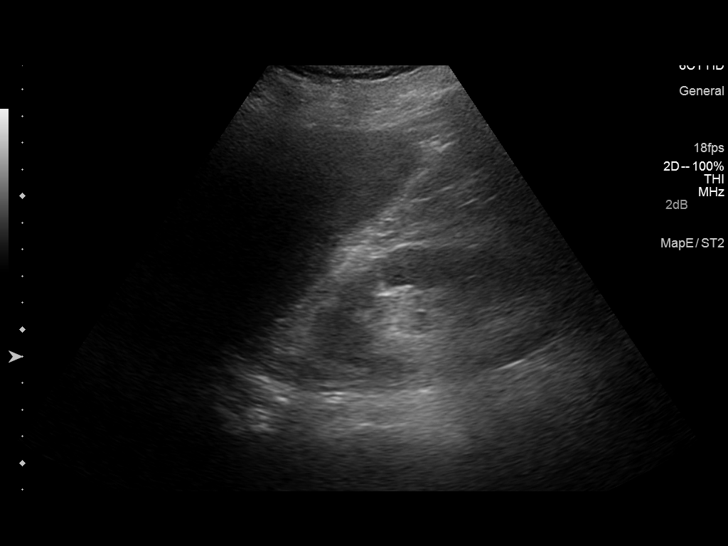
[im 18/27]
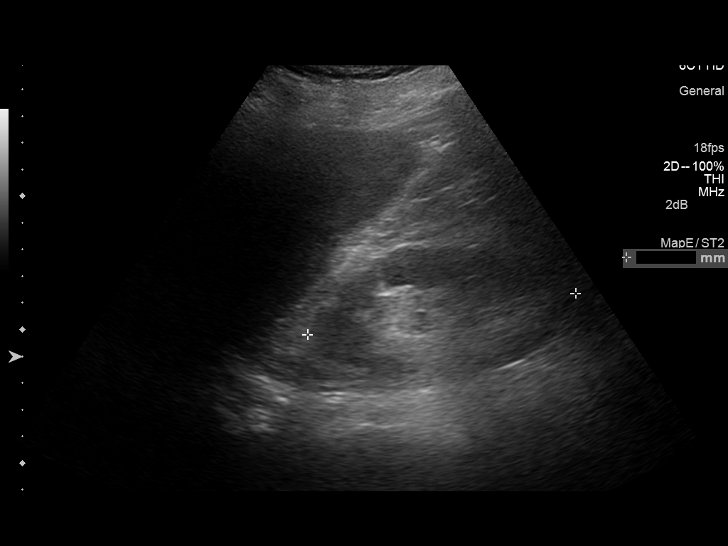
[im 20/27]
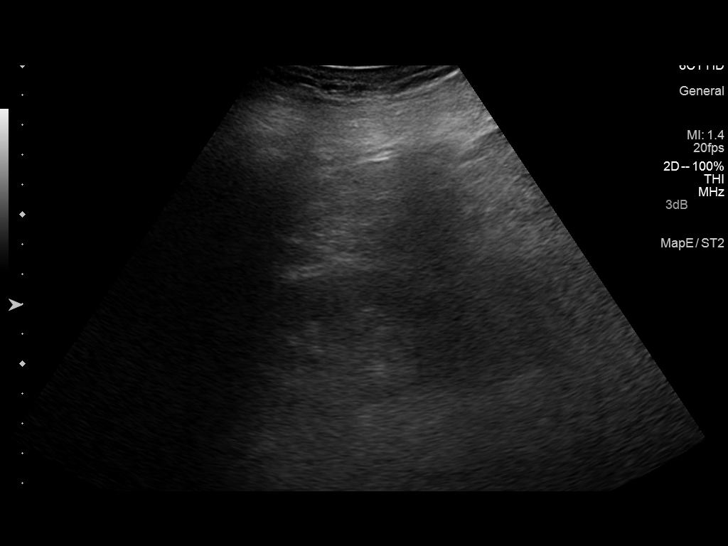
[im 22/27]
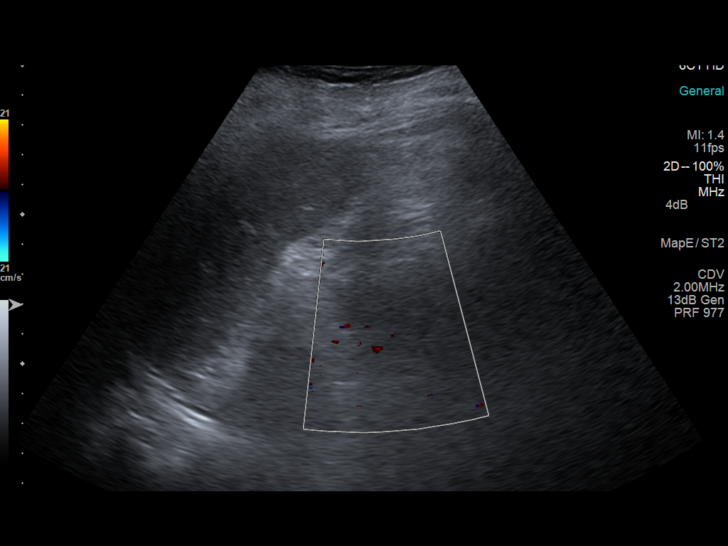
[im 24/27]
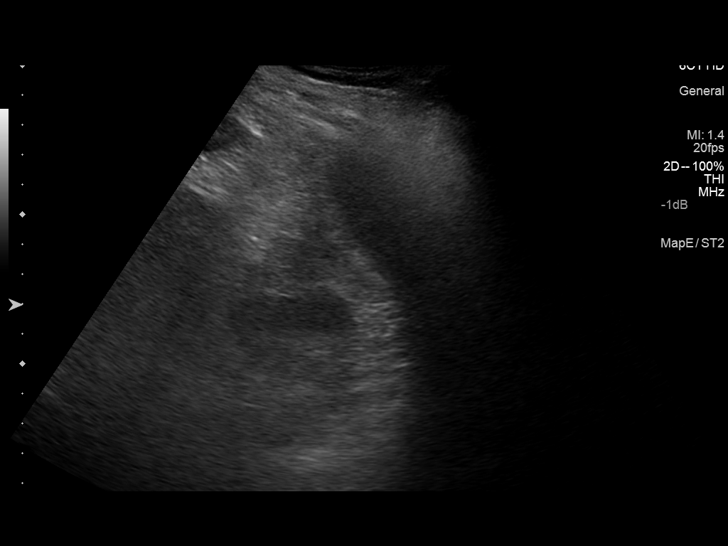
[im 27/27]
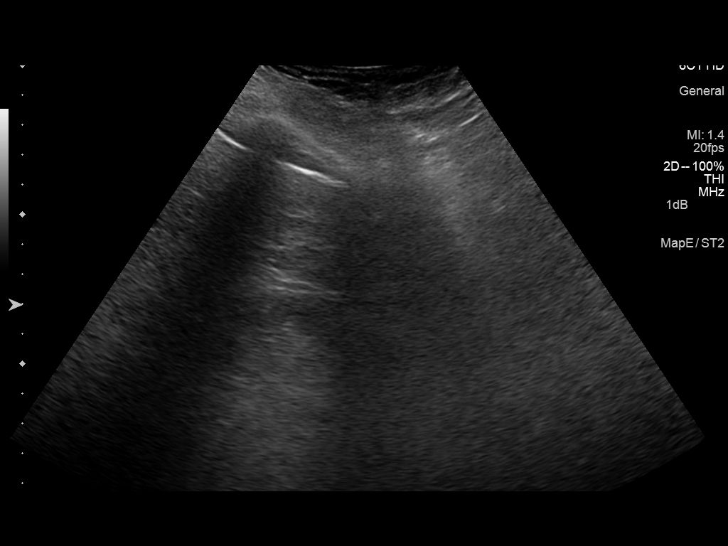

[14 of 25 positions shown; findings below may reference images not displayed]

FINDINGS: Right Kidney:

Length: 9.8 cm. Echogenicity within normal limits. No mass or
hydronephrosis visualized.

Left Kidney:

Length: 10.2 cm. Echogenicity within normal limits. No mass or
hydronephrosis visualized.

Bladder:

Bladder collapsed from Foley catheter.
IMPRESSION: Normal kidneys.  No hydronephrosis.  No obstruction.

## 2015-02-07 IMAGING — CR DG CHEST 1V PORT
1 series · 1 of 1 positions shown · non-contrast
Comparison: 10/24/2013

CLINICAL DATA: "Possible aspiration" tachypnea.

EXAM:
PORTABLE CHEST - 1 VIEW

[AP]
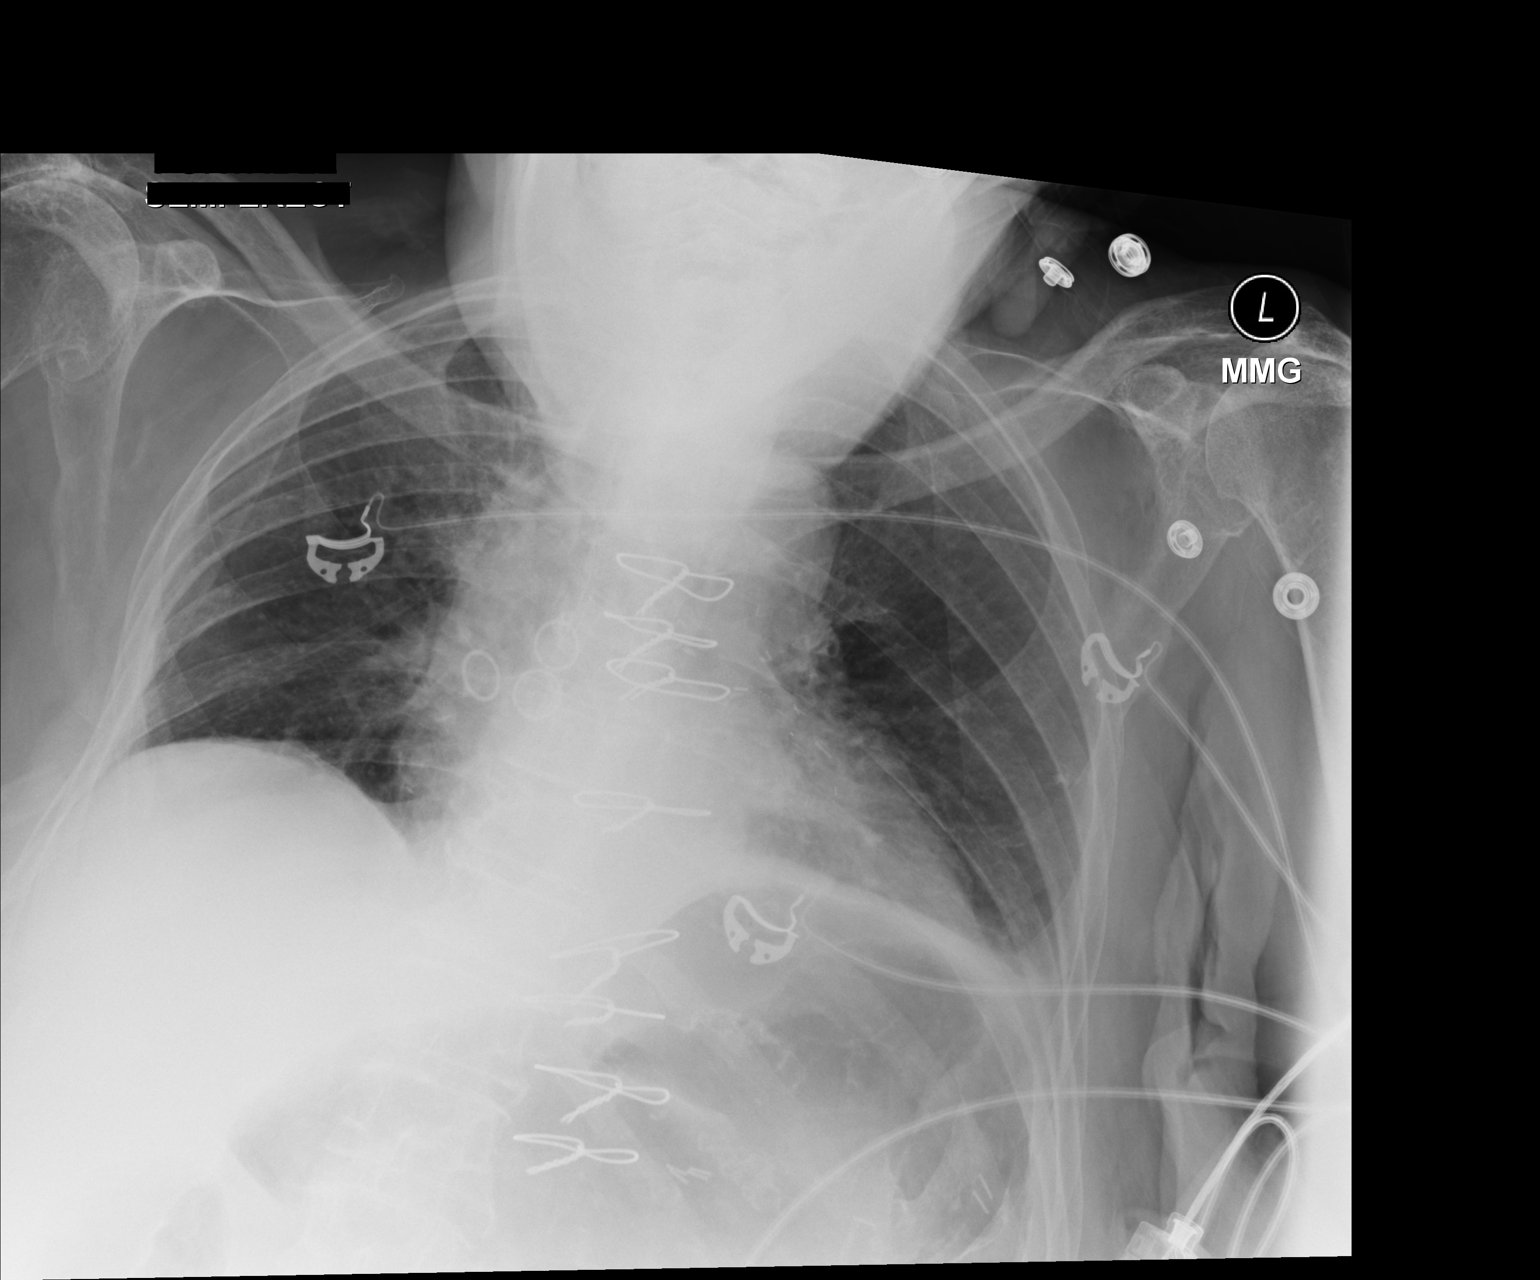

[1 of 1 positions shown; findings below may reference images not displayed]

FINDINGS: Low lung volumes are present, causing crowding of the pulmonary
vasculature. Medial upper lungs are obscured by the patient's chin
and facial tissues which are hunched over the chest.

Prior CABG noted. Band of left infrahilar opacity favors
atelectasis. Mild central airway thickening.
IMPRESSION: 1. No specific evidence of aspiration pneumonitis at this time.
2. Airway thickening is present, suggesting bronchitis or reactive
airways disease.
3. Mild retrocardiac atelectasis.
4. Low lung volumes.
